# Patient Record
Sex: Male | Born: 1935 | Race: White | Hispanic: No | Marital: Married | Smoking: Never smoker
Health system: Southern US, Community
[De-identification: ages and names within clinical notes are randomized; demographics above are authoritative.]

## PROBLEM LIST (undated history)

## (undated) DIAGNOSIS — K5790 Diverticulosis of intestine, part unspecified, without perforation or abscess without bleeding: Secondary | ICD-10-CM

## (undated) DIAGNOSIS — I471 Supraventricular tachycardia, unspecified: Secondary | ICD-10-CM

## (undated) DIAGNOSIS — N529 Male erectile dysfunction, unspecified: Secondary | ICD-10-CM

## (undated) DIAGNOSIS — K449 Diaphragmatic hernia without obstruction or gangrene: Secondary | ICD-10-CM

## (undated) DIAGNOSIS — K439 Ventral hernia without obstruction or gangrene: Secondary | ICD-10-CM

## (undated) DIAGNOSIS — I10 Essential (primary) hypertension: Secondary | ICD-10-CM

## (undated) DIAGNOSIS — I440 Atrioventricular block, first degree: Secondary | ICD-10-CM

## (undated) DIAGNOSIS — H9190 Unspecified hearing loss, unspecified ear: Secondary | ICD-10-CM

## (undated) DIAGNOSIS — E785 Hyperlipidemia, unspecified: Secondary | ICD-10-CM

## (undated) DIAGNOSIS — N400 Enlarged prostate without lower urinary tract symptoms: Secondary | ICD-10-CM

## (undated) DIAGNOSIS — J690 Pneumonitis due to inhalation of food and vomit: Secondary | ICD-10-CM

## (undated) DIAGNOSIS — J36 Peritonsillar abscess: Secondary | ICD-10-CM

## (undated) DIAGNOSIS — M549 Dorsalgia, unspecified: Secondary | ICD-10-CM

## (undated) DIAGNOSIS — M1990 Unspecified osteoarthritis, unspecified site: Secondary | ICD-10-CM

## (undated) DIAGNOSIS — K219 Gastro-esophageal reflux disease without esophagitis: Secondary | ICD-10-CM

## (undated) DIAGNOSIS — N183 Chronic kidney disease, stage 3 unspecified: Secondary | ICD-10-CM

## (undated) DIAGNOSIS — Z8739 Personal history of other diseases of the musculoskeletal system and connective tissue: Secondary | ICD-10-CM

## (undated) DIAGNOSIS — G8929 Other chronic pain: Secondary | ICD-10-CM

## (undated) DIAGNOSIS — C189 Malignant neoplasm of colon, unspecified: Secondary | ICD-10-CM

## (undated) DIAGNOSIS — G4752 REM sleep behavior disorder: Secondary | ICD-10-CM

## (undated) DIAGNOSIS — D649 Anemia, unspecified: Secondary | ICD-10-CM

## (undated) DIAGNOSIS — M75100 Unspecified rotator cuff tear or rupture of unspecified shoulder, not specified as traumatic: Secondary | ICD-10-CM

## (undated) DIAGNOSIS — E876 Hypokalemia: Secondary | ICD-10-CM

## (undated) DIAGNOSIS — I700 Atherosclerosis of aorta: Secondary | ICD-10-CM

## (undated) DIAGNOSIS — F0280 Dementia in other diseases classified elsewhere without behavioral disturbance: Secondary | ICD-10-CM

## (undated) DIAGNOSIS — Z87442 Personal history of urinary calculi: Secondary | ICD-10-CM

## (undated) HISTORY — PX: VEIN LIGATION AND STRIPPING: SHX2653

## (undated) HISTORY — DX: Benign prostatic hyperplasia without lower urinary tract symptoms: N40.0

## (undated) HISTORY — DX: Chronic kidney disease, stage 3 unspecified: N18.30

## (undated) HISTORY — DX: Hyperlipidemia, unspecified: E78.5

## (undated) HISTORY — DX: Essential (primary) hypertension: I10

## (undated) HISTORY — DX: Male erectile dysfunction, unspecified: N52.9

## (undated) HISTORY — DX: Dorsalgia, unspecified: M54.9

## (undated) HISTORY — DX: Unspecified hearing loss, unspecified ear: H91.90

## (undated) HISTORY — DX: Unspecified rotator cuff tear or rupture of unspecified shoulder, not specified as traumatic: M75.100

## (undated) HISTORY — PX: ROTATOR CUFF REPAIR: SHX139

## (undated) HISTORY — DX: Supraventricular tachycardia, unspecified: I47.10

## (undated) HISTORY — DX: Malignant neoplasm of colon, unspecified: C18.9

## (undated) HISTORY — DX: Gastro-esophageal reflux disease without esophagitis: K21.9

## (undated) HISTORY — DX: Pneumonitis due to inhalation of food and vomit: J69.0

## (undated) HISTORY — DX: Other chronic pain: G89.29

## (undated) HISTORY — PX: COLONOSCOPY: SHX174

## (undated) HISTORY — DX: Chronic kidney disease, stage 3 (moderate): N18.3

## (undated) HISTORY — DX: Diverticulosis of intestine, part unspecified, without perforation or abscess without bleeding: K57.90

## (undated) HISTORY — PX: EYE SURGERY: SHX253

## (undated) HISTORY — DX: Ventral hernia without obstruction or gangrene: K43.9

## (undated) HISTORY — PX: CATARACT EXTRACTION W/ INTRAOCULAR LENS IMPLANT: SHX1309

## (undated) HISTORY — DX: Supraventricular tachycardia: I47.1

## (undated) HISTORY — DX: REM sleep behavior disorder: G47.52

---
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
## 1961-09-12 HISTORY — PX: INGUINAL HERNIA REPAIR: SUR1180

## 1986-09-12 DIAGNOSIS — C189 Malignant neoplasm of colon, unspecified: Secondary | ICD-10-CM

## 1986-09-12 DIAGNOSIS — K439 Ventral hernia without obstruction or gangrene: Secondary | ICD-10-CM

## 1986-09-12 HISTORY — PX: UMBILICAL HERNIA REPAIR: SHX196

## 1986-09-12 HISTORY — DX: Malignant neoplasm of colon, unspecified: C18.9

## 1986-09-12 HISTORY — DX: Ventral hernia without obstruction or gangrene: K43.9

## 1986-09-12 HISTORY — PX: HEMICOLOECTOMY W/ ANASTOMOSIS: SHX1739

## 1998-09-12 HISTORY — PX: ROTATOR CUFF REPAIR: SHX139

## 1998-09-25 ENCOUNTER — Ambulatory Visit (HOSPITAL_BASED_OUTPATIENT_CLINIC_OR_DEPARTMENT_OTHER): Admission: RE | Admit: 1998-09-25 | Discharge: 1998-09-25 | Payer: Self-pay | Admitting: General Surgery

## 2003-10-20 ENCOUNTER — Encounter: Payer: Self-pay | Admitting: Family Medicine

## 2005-08-29 ENCOUNTER — Encounter: Payer: Self-pay | Admitting: Family Medicine

## 2006-10-18 ENCOUNTER — Other Ambulatory Visit: Payer: Self-pay

## 2006-10-18 ENCOUNTER — Ambulatory Visit: Payer: Self-pay | Admitting: Specialist

## 2008-07-01 ENCOUNTER — Encounter: Payer: Self-pay | Admitting: Gastroenterology

## 2008-07-01 ENCOUNTER — Encounter: Payer: Self-pay | Admitting: Family Medicine

## 2008-07-22 ENCOUNTER — Ambulatory Visit: Payer: Self-pay | Admitting: Family Medicine

## 2008-07-22 DIAGNOSIS — C189 Malignant neoplasm of colon, unspecified: Secondary | ICD-10-CM

## 2008-07-23 DIAGNOSIS — E785 Hyperlipidemia, unspecified: Secondary | ICD-10-CM

## 2008-07-23 DIAGNOSIS — K219 Gastro-esophageal reflux disease without esophagitis: Secondary | ICD-10-CM

## 2008-07-23 DIAGNOSIS — N183 Chronic kidney disease, stage 3 (moderate): Secondary | ICD-10-CM

## 2008-08-12 ENCOUNTER — Telehealth: Payer: Self-pay | Admitting: Gastroenterology

## 2008-09-22 ENCOUNTER — Ambulatory Visit: Payer: Self-pay | Admitting: Family Medicine

## 2008-09-23 LAB — CONVERTED CEMR LAB
CO2: 30 meq/L (ref 19–32)
Chloride: 105 meq/L (ref 96–112)
Creatinine, Ser: 1.3 mg/dL (ref 0.4–1.5)
GFR calc non Af Amer: 57 mL/min
Potassium: 4.1 meq/L (ref 3.5–5.1)
Sodium: 141 meq/L (ref 135–145)

## 2008-12-08 ENCOUNTER — Ambulatory Visit: Payer: Self-pay | Admitting: Family Medicine

## 2008-12-08 DIAGNOSIS — H9190 Unspecified hearing loss, unspecified ear: Secondary | ICD-10-CM

## 2008-12-08 DIAGNOSIS — R269 Unspecified abnormalities of gait and mobility: Secondary | ICD-10-CM | POA: Insufficient documentation

## 2008-12-08 DIAGNOSIS — H9193 Unspecified hearing loss, bilateral: Secondary | ICD-10-CM | POA: Insufficient documentation

## 2008-12-08 LAB — CONVERTED CEMR LAB
CO2: 31 meq/L (ref 19–32)
Chloride: 109 meq/L (ref 96–112)
Glucose, Bld: 92 mg/dL (ref 70–99)
Potassium: 4.4 meq/L (ref 3.5–5.1)
Sodium: 145 meq/L (ref 135–145)

## 2008-12-09 ENCOUNTER — Encounter: Admission: RE | Admit: 2008-12-09 | Discharge: 2008-12-09 | Payer: Self-pay | Admitting: Family Medicine

## 2008-12-15 ENCOUNTER — Ambulatory Visit: Payer: Self-pay | Admitting: Family Medicine

## 2008-12-15 DIAGNOSIS — M719 Bursopathy, unspecified: Secondary | ICD-10-CM

## 2008-12-15 DIAGNOSIS — M67919 Unspecified disorder of synovium and tendon, unspecified shoulder: Secondary | ICD-10-CM | POA: Insufficient documentation

## 2008-12-22 ENCOUNTER — Ambulatory Visit: Payer: Self-pay | Admitting: Gastroenterology

## 2008-12-30 ENCOUNTER — Telehealth: Payer: Self-pay | Admitting: Family Medicine

## 2009-01-08 ENCOUNTER — Ambulatory Visit: Payer: Self-pay | Admitting: Gastroenterology

## 2009-01-08 LAB — HM COLONOSCOPY

## 2009-12-03 ENCOUNTER — Encounter: Payer: Self-pay | Admitting: Family Medicine

## 2010-01-04 ENCOUNTER — Telehealth: Payer: Self-pay | Admitting: Family Medicine

## 2010-01-07 ENCOUNTER — Ambulatory Visit: Payer: Self-pay | Admitting: Family Medicine

## 2010-01-07 DIAGNOSIS — N401 Enlarged prostate with lower urinary tract symptoms: Secondary | ICD-10-CM

## 2010-01-07 DIAGNOSIS — N529 Male erectile dysfunction, unspecified: Secondary | ICD-10-CM | POA: Insufficient documentation

## 2010-01-08 ENCOUNTER — Telehealth: Payer: Self-pay | Admitting: Family Medicine

## 2010-01-08 DIAGNOSIS — I10 Essential (primary) hypertension: Secondary | ICD-10-CM

## 2010-01-11 ENCOUNTER — Telehealth: Payer: Self-pay | Admitting: Family Medicine

## 2010-01-11 LAB — CONVERTED CEMR LAB
ALT: 45 units/L (ref 0–53)
Bilirubin, Direct: 0.1 mg/dL (ref 0.0–0.3)
CO2: 31 meq/L (ref 19–32)
Calcium: 9.1 mg/dL (ref 8.4–10.5)
Chloride: 104 meq/L (ref 96–112)
Creatinine, Ser: 1.9 mg/dL — ABNORMAL HIGH (ref 0.4–1.5)
Direct LDL: 83.4 mg/dL
Eosinophils Relative: 3.6 % (ref 0.0–5.0)
GFR calc non Af Amer: 36.84 mL/min (ref >60)
Glucose, Bld: 91 mg/dL (ref 70–99)
MCV: 94.1 fL (ref 78.0–100.0)
Monocytes Absolute: 1 10*3/uL (ref 0.1–1.0)
Monocytes Relative: 10.9 % (ref 3.0–12.0)
Neutrophils Relative %: 71.8 % (ref 43.0–77.0)
Platelets: 267 10*3/uL (ref 150.0–400.0)
Potassium: 4.8 meq/L (ref 3.5–5.1)
Sodium: 142 meq/L (ref 135–145)
Total Bilirubin: 0.3 mg/dL (ref 0.3–1.2)
WBC: 9.5 10*3/uL (ref 4.5–10.5)

## 2010-01-25 ENCOUNTER — Encounter: Payer: Self-pay | Admitting: Family Medicine

## 2010-02-12 ENCOUNTER — Telehealth: Payer: Self-pay | Admitting: Family Medicine

## 2010-10-14 NOTE — Progress Notes (Signed)
Summary: call a nurse  Phone Note Call from Patient   Call For: [name] MD Summary of Call: Triage Record Num: [id number] Operator: [name] Rhinehardt Patient Name: Michael [name] Call Date & Time: 01/01/2010 12:29:21PM Patient Phone: [phone] PCP: [name] Patient Gender: Male PCP Fax : [phone] Patient DOB: 1932/05/16 Practice Name: [facility] Reason for Call: Caller requesting an Rx for Viagra before he goes out of town tonight. Has not had an Rx from this PCP before. RN advised caller that this would not be able to be be called in before seeing MD next week. Protocol(s) Used: Medication Question Calls, No Triage (Adults) Recommended Outcome per Protocol: Call Provider within 24 Hours Override Outcome if Used in Protocol: Call Provider When Office is Open RN Reason for Override Outcome: Provider Alert / Physician's Order Reason for Outcome: Caller requesting a non urgent new prescription or refill and triager unable to refill per unit policy Care Advice:  ~ 01/01/2010 12:42:57PM Page 1 of 1 CAN_TriageRpt_ Initial call taken by: Melody Comas,  January 04, 2010 10:53 AM  Follow-up for Phone Call        OK to call in Viagra 100 mg, 1/2 to 1 tab by mouth as needed prior to intercourse. #10, 11 refills  Tell him to take a 1/2 -- that is usually enough. If not, OK to take a full Follow-up by: [name] MD,  January 04, 2010 11:13 AM  Additional Follow-up for Phone Call Additional follow up Details #1::        Patient must already be out of town will call in if patietn calls back Additional Follow-up by: Benny Lennert CMA Duncan Dull),  January 04, 2010 11:54 AM

## 2010-10-14 NOTE — Letter (Signed)
Summary: [facility]  [facility]   Imported By: Lanelle Bal 02/02/2010 09:53:59  _____________________________________________________________________  External Attachment:    Type:   Image     Comment:   External Document

## 2010-10-14 NOTE — Progress Notes (Signed)
Summary: refill request for meloxicam  Phone Note Refill Request Message from:  Fax from Pharmacy  Refills Requested: Medication #1:  MELOXICAM 15 MG TABS one by mouth daily   Last Refilled: 04/21/2009 Faxed request from [facility] [address].  Initial call taken by: Lowella Petties CMA,  January 08, 2010 11:42 AM    Prescriptions: MELOXICAM 15 MG TABS (MELOXICAM) one by mouth daily  #30 x 6   Entered and Authorized by:   [name] MD   Signed by:   [name] MD on 01/08/2010   Method used:   Electronically to        [facility] [address] (retail)       [address], [address]       [place], [place]  [address]       Ph: [phone]       Fax: [phone]   RxID:   [id number]

## 2010-10-14 NOTE — Progress Notes (Signed)
Summary: Cialis samples  Phone Note Call from Patient Call back at Home Phone [phone]   Caller: Patient Call For: Michael Beat MD Summary of Call: Mr. Decelles said that he was at [facility] to pick up some prescriptions and that Rob told him that he could get some samples of Cialis but that the prescription would have to be sent in specifically for the samples.  It is apparently a promotional kind of thing.  Mr. Tagliaferro would like to get these samples.  If you are not familiar with what he is speaking of, I will call [facility] to get more info if you prefer.  Patient asked that we please phone him  to let him know if this has been called in.  Uses [facility] Initial call taken by: Delilah Shan CMA (AAMA),  February 12, 2010 11:15 AM  Follow-up for Phone Call        I am not sure -- fine for him to take an use. Can you call [facility], confirm how it needs to be ordered to use this program, and call in for me.  ok to call in up to 10. directions in his med list Follow-up by: Michael Beat MD,  February 12, 2010 1:45 PM  Additional Follow-up for Phone Call Additional follow up Details #1::        Rob says he was referring to the coupon that is with our samples.  The patient can take the voucher and a new prescription to the pharmacy and receive a Free Trial of Cialis (30 tablets of Cialis daily) OR (3 tablets of 36 hour Cialis).  The prescription needs to be written so that it can be carried with the voucher for one of the above listed forms of the med (daily or 36 hour).  Patient will pick up the script and voucher when they return from out of town.  Please give Rx. to me upon your return next week. Additional Follow-up by: Delilah Shan CMA Duncan Dull),  February 12, 2010 2:15 PM    Additional Follow-up for Phone Call Additional follow up Details #2::    call and give coupon card Follow-up by: Michael Beat MD,  February 15, 2010 8:02 AM  Additional Follow-up for Phone Call Additional follow up Details #3::  Details for Additional Follow-up Action Taken: Patient Advised. Prescription left at front desk.  Additional Follow-up by: Delilah Shan CMA (AAMA),  February 15, 2010 8:16 AM  Prescriptions: CIALIS 20 MG TABS (TADALAFIL) 1 by mouth 30 minutes prior to intercourse  #3 x 0   Entered and Authorized by:   Michael Beat MD   Signed by:   Michael Beat MD on 02/15/2010   Method used:   Print then Give to Patient   RxID:   [id number]

## 2010-10-14 NOTE — Consult Note (Signed)
Summary: [facility]  [facility]   Imported By: Lanelle Bal 12/10/2009 12:36:01  _____________________________________________________________________  External Attachment:    Type:   Image     Comment:   External Document

## 2010-10-14 NOTE — Assessment & Plan Note (Signed)
Summary: FOLLOW UP/RI   Vital Signs:  Patient profile:   77 year old male Height:      71 inches Weight:      224.4 pounds BMI:     31.41 Temp:     97.8 degrees F oral Pulse rate:   80 / minute Pulse rhythm:   regular BP sitting:   110 / 70  (left arm) Cuff size:   large  Vitals Entered By: Benny Lennert CMA Duncan Dull) (January 07, 2010 3:50 PM)  Serial Vital Signs/Assessments:  Time      Position  BP       Pulse  Resp  Temp     By 4:20 PM             128/72                         [name] MD   History of Present Illness: Chief complaint follow up, the patient has multiple complaint and concerns that he brings up   BP stable: tol medications fine, concern over 110/70 obtained by nursing, so checked on my entry to room  Chol: tol medications fine, but he has not had a repeat lipid in > 1 year  Colon CA - 1988, 12/2008, colonoscopy, repeat 5 years Dr. Arlyce Dice, f/u colon in 5 years  ED: intermittent problems with erections, has had for some time. Poor quality. Able to ejaculate. Sometimes will lose erection, sometimes trouble initiating. Viagra or other med? interested  Conjunctivitis: Was away this past weekend. Monday - started to see a problem with the eyes. Having a lot of discharge - last night they were a whole lot. Has been using compresses on eyes. No known contact with conjunctivitis.  urinary outflow problems: longstanding, has been evaluated by 2 different urologists in the past. Trouble initiating and cutting off stream. Up in the middle of the night often every hour or two.  dysuria - he had a little in the past week, but that has now resolved.  Also needs f/u other labs, longterm med lab f/u, CEA  Also has c/o L > R impingement symptoms. Aggrevated recently with very heavy work outside. Last year was quite symptomatic, resolved when he went to [place] over the winter.    Allergies (verified): No Known Drug Allergies  Past History:  Past medical,  surgical, family and social histories (including risk factors) reviewed, and no changes noted (except as noted below).  Past Medical History: Colon CA, 1988 Ventral hernia, 1988 Hyperlipidemia Renal insufficiency GERD R rotator cuff rupture, s/p repair Hypertension  Past Surgical History: Reviewed history from 12/15/2008 and no changes required. hemicolectomy, 1988, [name] Ventral hernia repair, 1988, [facility] R shoulder RTC repair, [facility]  Family History: Reviewed history from 07/22/2008 and no changes required. Father, OA  Social History: Reviewed history from 07/22/2008 and no changes required. [name]'s father [name]'s father in Social worker (front office) Research in Teaching laboratory technician. 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  General:  Denies fever. Eyes:  Complains of discharge, eye irritation, and red eye. ENT:  Complains of postnasal drainage. CV:  Denies  chest pain or discomfort. Resp:  Denies cough and shortness of breath. GI:  Denies nausea and vomiting. GU:  Complains of urinary hesitancy; denies urinary frequency. MS:  Complains of joint pain and stiffness; denies joint swelling. Neuro:  Denies headaches. Psych:  Denies anxiety and depression. Endo:  Complains of excessive urination. Heme:  Denies abnormal bruising and enlarge lymph nodes.  Physical Exam  General:  Well-developed,well-nourished,in no acute distress; alert,appropriate and cooperative throughout examination Head:  Normocephalic and atraumatic without obvious abnormalities. No apparent alopecia or balding. Eyes:  pinkish, irritated sclera and irritated conjunctiva B Ears:  External ear exam  shows no significant lesions or deformities.  Otoscopic examination reveals clear canals, tympanic membranes are intact bilaterally without bulging, retraction, inflammation or discharge. Hearing is grossly normal bilaterally. Nose:  External nasal examination shows no deformity or inflammation. Nasal mucosa are pink and moist without lesions or exudates. Mouth:  Oral mucosa and oropharynx without lesions or exudates.  Teeth in good repair. Neck:  No deformities, masses, or tenderness noted. Chest Wall:  No deformities, masses, tenderness or gynecomastia noted. Lungs:  Normal respiratory effort, chest expands symmetrically. Lungs are clear to auscultation, no crackles or wheezes. Heart:  Normal rate and regular rhythm. S1 and S2 normal without gallop, murmur, click, rub or other extra sounds. Abdomen:  Bowel sounds positive,abdomen soft and non-tender without masses, organomegaly or hernias noted. Rectal:  No external abnormalities noted. Normal sphincter tone. No rectal masses or tenderness. Genitalia:  Testes bilaterally descended without nodularity, tenderness or masses. No scrotal masses or lesions. No penis lesions or urethral discharge. Prostate:  mildly enlarged, NT. No nodules. Msk:  Shoulder: B Inspection: No muscle wasting or winging Ecchymosis/edema: neg  AC joint, scapula, clavicle: NT Cervical spine: NT, full ROM Spurling's: neg Abduction: full, 5/5 Flexion: full, 5/5 IR, full, lift-off: 4/5 ER at neutral: full, 4/5 AC crossover: neg Neer: pos Hawkins: pos, L >> R Drop Test: neg Empty Can: pos Supraspinatus insertion: mild-mod T Bicipital groove: NT Speed's: pos, B C5-T1 intact  Neuro: Sensation intact Grip 5/5  Extremities:  No clubbing, cyanosis, edema, or deformity noted with normal full range of motion of all joints.   Neurologic:  alert & oriented X3, sensation intact to light touch, and gait normal.   Skin:  no rashes.   Cervical Nodes:  No lymphadenopathy  noted Psych:  Cognition and judgment appear intact. Alert and cooperative with normal attention span and concentration. No apparent delusions, illusions, hallucinations   Impression & Recommendations:  Problem # 1:  CONJUNCTIVITIS (ICD-372.30) Assessment New  His updated medication list for this problem includes:    Polytrim 10000-0.1 Unit/ml-% Soln (Polymyxin b-trimethoprim) .Marland Kitchen... 1 gtt in affected eye qid x 7days  Discussed treatment, and urged patient to wash hands carefully after touching face.   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 1 by mouth 30 minutes prior to intercourse  Discussed proper use of medications, as well as side effects.   Problem # 3:  BENIGN PROSTATIC HYPERTROPHY, WITH URINARY OBSTRUCTION (ICD-600.01) Assessment: New Flomax trial  Problem # 4:  HYPERTENSION (ICD-401.9) Assessment: Unchanged  His updated medication list for this problem includes:    Diltiazem Hcl Er Beads 240 Mg Xr24h-cap (Diltiazem hcl er beads) .Marland Kitchen... Take one daily  BP today: 110/70 Prior BP: 120/80 (12/15/2008)  Labs Reviewed: K+: 4.4 (12/08/2008) Creat: : 1.5 (12/08/2008)  Problem # 5:  ROTATOR CUFF SYNDROME, RIGHT (ICD-726.10) Assessment: Deteriorated L > R RTC At 77, discussed activity mod, RTC and scapular stabilization SAD, DCE seems less of a viabile option if conservative measures allow to resolve  Problem # 6:  HYPERLIPIDEMIA (ICD-272.4) Assessment: Unchanged  His updated medication list for this problem includes:    Lovastatin 20 Mg Tabs (Lovastatin) .Marland Kitchen... 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 Take one daily 2)  Fish Oil 1200 Mg Caps (Omega-3 fatty acids) .... Take one capsule daily 3)  Marine scientist  .... Take one daily 4)  Calcium 600mg  and Vit D-3  .... Take one daily 5)  Aspirin 81 Mg Tbec (Aspirin) .... Take one sometimes 6)  Omeprazole 20 Mg Tbec (Omeprazole) .Marland Kitchen.. 1 by mouth q day 7)  Meloxicam 15 Mg Tabs (Meloxicam) .... One by mouth daily 8)  Lovastatin 20 Mg Tabs (Lovastatin) .... Take 1 tablet by mouth once daily 9)  Tamsulosin Hcl 0.4 Mg Caps (Tamsulosin hcl) .Marland Kitchen.. 1 by mouth daily 10)  Cialis 20 Mg Tabs (Tadalafil) .Marland Kitchen.. 1 by mouth 30 minutes prior to intercourse 11)  Polytrim 10000-0.1 Unit/ml-% Soln (Polymyxin b-trimethoprim) .Marland Kitchen.. 1 gtt in affected eye qid x 7days 12)  Zolpidem Tartrate 10 Mg Tabs (Zolpidem tartrate) .Marland Kitchen.. 1 by mouth at bedtime as needed insomnia  Other Orders: TLB-CBC Platelet - w/Differential (85025-CBCD) TLB-Hepatic/Liver Function Pnl (80076-HEPATIC) Prescriptions: ZOLPIDEM TARTRATE 10 MG TABS (ZOLPIDEM TARTRATE) 1 by mouth at bedtime as needed insomnia  #30 x 1   Entered and Authorized by:   [name] MD   Signed by:   [name] MD on 01/07/2010   Method used:   Print then Give to Patient   RxID:   [id number] POLYTRIM 10000-0.1 UNIT/ML-%  SOLN (POLYMYXIN B-TRIMETHOPRIM) 1 gtt in affected eye qid x 7days  #1 x 0   Entered and Authorized by:   [name] MD   Signed by:   [name] MD on 01/07/2010   Method used:   Print then Give to Patient   RxID:   [id number] CIALIS 20 MG TABS (TADALAFIL) 1 by mouth 30 minutes prior to  intercourse  #10 x 11   Entered and Authorized by:   [name] MD   Signed by:   [name] MD on 01/07/2010   Method used:   Print then Give to Patient   RxID:   [id number] TAMSULOSIN HCL 0.4 MG CAPS (TAMSULOSIN HCL) 1 by mouth daily  #30 x 11   Entered and Authorized by:   [name] MD   Signed by:   [name] MD on 01/07/2010   Method used:   Print then Give to Patient   RxID:   [id number] LOVASTATIN 20 MG TABS (LOVASTATIN) take 1 tablet by mouth once daily  #90 x 3   Entered and Authorized by:   [name] MD   Signed by:   [name] MD on 01/07/2010   Method used:  Print then Give to Patient   RxID:   [id number] OMEPRAZOLE 20 MG TBEC (OMEPRAZOLE) 1 by mouth q day  #30 x 11   Entered and Authorized by:   [name] MD   Signed by:   [name] MD on 01/07/2010   Method used:   Print then Give to Patient   RxID:   [id number] DILTIAZEM HCL ER BEADS 240 MG XR24H-CAP (DILTIAZEM HCL ER BEADS) take one daily  #30 x 12   Entered and Authorized by:   [name] MD   Signed by:   [name] MD on 01/07/2010   Method used:   Print then Give to Patient   RxID:   [id number]   Current Allergies (reviewed today): No known allergies

## 2010-10-14 NOTE — Progress Notes (Signed)
  Phone Note Call from Patient Call back at [facility] Phone [phone]   Caller: Patient Call For: Dr Patsy Lager Summary of Call: Please call pt regarding PSA results. Initial call taken by: Beau Fanny,  Jan 11, 2010 9:44 AM  Follow-up for Phone Call        also patient needs meloxicam refilled if possible Follow-up by: Benny Lennert CMA Duncan Dull),  Jan 11, 2010 10:49 AM  Additional Follow-up for Phone Call Additional follow up Details #1::        d/w pt  PSA = 14  could be due to prostatitis will send in cipro, has f/u 01/25/2010 with urology, which I think he needs Additional Follow-up by: [name] MD,  Jan 11, 2010 11:28 AM    New/Updated Medications: CIPROFLOXACIN HCL 500 MG TABS (CIPROFLOXACIN HCL) 1 by mouth two times a day Prescriptions: CIPROFLOXACIN HCL 500 MG TABS (CIPROFLOXACIN HCL) 1 by mouth two times a day  #28 x 0   Entered and Authorized by:   [name] MD   Signed by:   [name] MD on 01/11/2010   Method used:   Electronically to        [facility]* (retail)       [address]       [place], [place]  [address]       Ph: [phone]       Fax: [phone]   RxID:   [id number]

## 2011-01-20 ENCOUNTER — Other Ambulatory Visit: Payer: Self-pay | Admitting: Family Medicine

## 2011-01-28 ENCOUNTER — Encounter: Payer: Self-pay | Admitting: Family Medicine

## 2011-01-31 ENCOUNTER — Ambulatory Visit (INDEPENDENT_AMBULATORY_CARE_PROVIDER_SITE_OTHER): Payer: Medicare Other | Admitting: Family Medicine

## 2011-01-31 ENCOUNTER — Encounter: Payer: Self-pay | Admitting: Family Medicine

## 2011-01-31 VITALS — BP 112/70 | HR 80 | Temp 97.7°F | Ht 70.5 in | Wt 220.1 lb

## 2011-01-31 DIAGNOSIS — M7020 Olecranon bursitis, unspecified elbow: Secondary | ICD-10-CM | POA: Insufficient documentation

## 2011-01-31 NOTE — Patient Instructions (Signed)
Make appointment at your convenience in next few weeks-months fasting for blood work as well as physical. For olecranon bursitis - drained today.  Keep arm elevated, keep in bandage for next 24 hours then wrap as needed. May do ice as well. Update [place] if returns.

## 2011-01-31 NOTE — Progress Notes (Signed)
  Subjective:    Patient ID: Michael [name], male    DOB: 1932/01/28, 77 y.o.   MRN: [id number]  HPI CC: elbow swelling  Insect bite 1 wk ago R elbow, caused swelling 4-5 hours later, swollen since.  Thinks swelling has gone down some.  Seen at [facility] last week, did not drain it.  Tried alleve x1.  Put ice on it as well, no noted improvement.  No pain.    Never had elbow swelling in past.  No pain, spreading redness, warmth.  H/o some arthritis in past.  No gout.  Review of Systems Per HPI    Objective:   Physical Exam  [nursing notereviewed. Constitutional: He appears well-developed and well-nourished. No distress.  Musculoskeletal:       Right elbow: He exhibits swelling and effusion. He exhibits normal range of motion. no tenderness found.       Left elbow: Normal.       R olecranon bursitis, no erythema or significant tenderness at site       Aspiration: area cleaned with EtOH wipe then with betadine.  Anesthesia achieved with ethyl chloride.  22g needle inserted into proximal olecranon bursa, 15cc bloody serous fluid aspirated with good decompression of inflamed bursa.  Wrapped with compression bandage (kerlex).  Pt tolerated procedure well, no blood loss  Assessment & Plan:

## 2011-01-31 NOTE — Assessment & Plan Note (Signed)
Traumatic, after insect bite.   Aspiration performed.   Fluid sent for culture to r/o infection. See pt instructions

## 2011-02-02 ENCOUNTER — Telehealth: Payer: Self-pay | Admitting: Family Medicine

## 2011-02-02 MED ORDER — NAPROXEN 500 MG PO TABS: ORAL_TABLET | ORAL | Status: DC

## 2011-02-02 NOTE — Telephone Encounter (Signed)
Called to f/u on wife.  Pt comes on and states olecranon bursitis has reaccumulated but not bothering him.  I will send in NSAID, advised pt to come in for re aspiration and possible steroid injection of becoming bothersome.  Pt agrees.

## 2011-02-03 ENCOUNTER — Other Ambulatory Visit: Payer: Self-pay | Admitting: *Deleted

## 2011-02-03 MED ORDER — DILTIAZEM HCL ER COATED BEADS 240 MG PO CP24: 240.0000 mg | ORAL_CAPSULE | Freq: Every day | ORAL | Status: DC

## 2011-02-04 LAB — BODY FLUID CULTURE
Gram Stain: NONE SEEN
Organism ID, Bacteria: NO GROWTH

## 2011-02-08 ENCOUNTER — Telehealth: Payer: Self-pay | Admitting: Family Medicine

## 2011-02-08 NOTE — Telephone Encounter (Signed)
Patient's wife notified and will have patient call back if necessary.

## 2011-02-08 NOTE — Telephone Encounter (Signed)
Please notify elbow fluid culture returned without evidence of infection.  If reaccumulated may need aspiration again.

## 2011-04-20 ENCOUNTER — Encounter: Payer: Medicare Other | Admitting: Family Medicine

## 2011-05-03 ENCOUNTER — Encounter: Payer: Self-pay | Admitting: Family Medicine

## 2011-05-03 ENCOUNTER — Ambulatory Visit (INDEPENDENT_AMBULATORY_CARE_PROVIDER_SITE_OTHER): Payer: Medicare Other | Admitting: Family Medicine

## 2011-05-03 ENCOUNTER — Encounter: Payer: Medicare Other | Admitting: Family Medicine

## 2011-05-03 VITALS — BP 120/72 | HR 78 | Temp 98.2°F | Ht 71.0 in | Wt 218.8 lb

## 2011-05-03 DIAGNOSIS — K769 Liver disease, unspecified: Secondary | ICD-10-CM

## 2011-05-03 DIAGNOSIS — R739 Hyperglycemia, unspecified: Secondary | ICD-10-CM

## 2011-05-03 DIAGNOSIS — N289 Disorder of kidney and ureter, unspecified: Secondary | ICD-10-CM

## 2011-05-03 DIAGNOSIS — Z79899 Other long term (current) drug therapy: Secondary | ICD-10-CM

## 2011-05-03 DIAGNOSIS — K7689 Other specified diseases of liver: Secondary | ICD-10-CM

## 2011-05-03 DIAGNOSIS — I10 Essential (primary) hypertension: Secondary | ICD-10-CM

## 2011-05-03 DIAGNOSIS — N401 Enlarged prostate with lower urinary tract symptoms: Secondary | ICD-10-CM

## 2011-05-03 DIAGNOSIS — E785 Hyperlipidemia, unspecified: Secondary | ICD-10-CM

## 2011-05-03 DIAGNOSIS — Z0000 Encounter for general adult medical examination without abnormal findings: Secondary | ICD-10-CM

## 2011-05-03 DIAGNOSIS — R7309 Other abnormal glucose: Secondary | ICD-10-CM

## 2011-05-03 DIAGNOSIS — N259 Disorder resulting from impaired renal tubular function, unspecified: Secondary | ICD-10-CM

## 2011-05-03 DIAGNOSIS — C189 Malignant neoplasm of colon, unspecified: Secondary | ICD-10-CM

## 2011-05-03 MED ORDER — TRIAMCINOLONE ACETONIDE 0.1 % EX CREA: TOPICAL_CREAM | Freq: Two times a day (BID) | CUTANEOUS | Status: DC

## 2011-05-03 MED ORDER — ZOLPIDEM TARTRATE 10 MG PO TABS: ORAL_TABLET | ORAL | Status: DC

## 2011-05-03 NOTE — Progress Notes (Signed)
Subjective:    Patient ID: Michael [name], male    DOB: [birth year], 77 y.o.   MRN: [id number]  HPI  Michael [name], a 77 y.o. male presents today in the office for the following:  Medicare wellness, multiple other problems.  I have personally reviewed the Medicare Annual Wellness questionnaire and have noted 1. The patient's medical and social history 2. Their use of alcohol, tobacco or illicit drugs 3. Their current medications and supplements 4. The patient's functional ability including ADL's, fall risks, home safety risks and hearing or visual             impairment. 5. Diet and physical activities 6. Evidence for depression or mood disorders  The patients weight, height, BMI and visual acuity have been recorded in the chart I have made referrals, counseling and provided education to the patient based review of the above and I have provided the pt with a written personalized care plan for preventive services.  I have provided the patient with a copy of your personalized plan for preventive services. Instructed to take the time to review along with their updated medication list.   HTN: Tolerating all medications without side effects Stable and at goal No CP, no sob. No HA.  BP Readings from Last 3 Encounters:  05/03/11 120/72  01/31/11 112/70  01/07/10 110/70    Basic Metabolic Panel:    Component Value Date/Time   NA 142 01/07/2010 1624   K 4.8 01/07/2010 1624   CL 104 01/07/2010 1624   CO2 31 01/07/2010 1624   BUN 27* 01/07/2010 1624   CREATININE 1.9* 01/07/2010 1624   GLUCOSE 91 01/07/2010 1624   CALCIUM 9.1 01/07/2010 1624   Lipids: Doing well, stable. Tolerating meds fine with no SE. Panel reviewed with patient. Needs new labs  Lipids:    Component Value Date/Time   LDLDIRECT 83.4 01/07/2010 1624    Lab Results  Component Value Date   ALT 45 01/07/2010   AST 38* 01/07/2010   ALKPHOS 65 01/07/2010   BILITOT 0.3 01/07/2010   Lesions on his liver x 2 and kidneys x  2 seen on CT in 11/2010 - see scanned report. Prior history of colon cancer. Asymptomatic.   Poison Ivy: multiple episodes of poison ivy, bug bites this summer treated with OTC topicals  Insomnia: intermittently will take Ambien with good effect  ED: occ need, requests samples.  The PMH, PSH, Social History, Family History, Medications, and allergies have been reviewed in [facility], and have been updated if relevant.  Review of Systems ROS: GEN: No acute illnesses, no fevers, chills. CT from above reviewed with patient. GI: No n/v/d, eating normally Pulm: No SOB Feels well Skin probs above No CP No SOB No black tarry stool or bloody stool Interactive and getting along well at home. Otherwise, the pertinent positives and negatives are listed above and in the HPI, otherwise a full review of systems has been reviewed and is negative unless noted positive.     Objective:   Physical Exam   Physical Exam  Blood pressure 120/72, pulse 78, temperature 98.2 F (36.8 C), temperature source Oral, height 5\' 11"  (1.803 m), weight 218 lb 12.8 oz (99.247 kg), SpO2 96.00%.  PE: GEN: well developed, well nourished, no acute distress Eyes: conjunctiva and lids normal, PERRLA, EOMI ENT: TM clear, nares clear, oral exam WNL Neck: supple, no lymphadenopathy, no thyromegaly, no JVD Pulm: clear to auscultation and percussion, respiratory effort normal CV: regular rate and rhythm,  S1-S2, no murmur, rub or gallop, no bruits, peripheral pulses normal and symmetric, no cyanosis, clubbing, edema or varicosities Chest: no scars, masses, no gynecomastia   GI: soft, non-tender; no hepatosplenomegaly, masses; active bowel sounds all quadrants GU: no hernia, testicular mass, penile discharge, priapism or prostate enlargement Lymph: no cervical, axillary or inguinal adenopathy MSK: gait normal, muscle tone and strength WNL, no joint swelling, effusions, discoloration, crepitus  SKIN: clear, good turgor, color  WNL, no rashes, lesions, or ulcerations Neuro: normal mental status, normal strength, sensation, and motion Psych: alert; oriented to person, place and time, normally interactive and not anxious or depressed in appearance.       Assessment & Plan:   1. Routine general medical examination at a health care facility : The patient's preventative maintenance and recommended screening tests for an annual wellness exam were reviewed in full today. Brought up to date unless services declined.  Counselled on the importance of diet, exercise, and its role in overall health and mortality. The patient's FH and SH was reviewed, including their home life, tobacco status, and drug and alcohol status.    2. HYPERLIPIDEMIA : cont statin LDL cholesterol, direct  3. HYPERTENSION : cont ca channel blocker   4. MALIG NEOPLASM OTHER SPEC SITES LARGE INTESTINE  CEA, CT Abdomen Pelvis W Wo Contrast, CT Abdomen Pelvis W Contrast  5. RENAL INSUFFICIENCY : f/u labs Basic metabolic panel  6. BENIGN PROSTATIC HYPERTROPHY, WITH URINARY OBSTRUCTION : mildly symptomatic   7. Encounter for long-term (current) use of other medications  CBC with Differential, Hepatic function panel  8. Hyperglycemia : ? H/o elevated BS in past Hemoglobin A1c  9. Liver lesion  CT Abdomen Pelvis W Wo Contrast, CT Abdomen Pelvis W Contrast  10. Renal lesion  CT Abdomen Pelvis W Wo Contrast, CT Abdomen Pelvis W Contrast   Renal lesions x 2 and liver lesions x 2 seen on CT without contrast in [place]. Will obtain at CT of the abdomen and pelvis with and without contrast in this setting of prior colon CA to evaluate for potential metastasis or other primary tumor vs. Simple cyst.

## 2011-05-03 NOTE — Patient Instructions (Signed)
REFERRAL: GO THE THE FRONT ROOM AT THE ENTRANCE OF OUR CLINIC, NEAR CHECK IN. ASK FOR MARION. SHE WILL HELP YOU SET UP YOUR REFERRAL. DATE: TIME:  

## 2011-05-04 LAB — HEMOGLOBIN A1C: Hgb A1c MFr Bld: 5.6 % (ref 4.6–6.5)

## 2011-05-04 LAB — BASIC METABOLIC PANEL
Calcium: 9.6 mg/dL (ref 8.4–10.5)
Chloride: 105 mEq/L (ref 96–112)
Creatinine, Ser: 2.2 mg/dL — ABNORMAL HIGH (ref 0.4–1.5)
GFR: 31.32 mL/min — ABNORMAL LOW (ref >60.00)

## 2011-05-04 LAB — CBC WITH DIFFERENTIAL/PLATELET
Basophils Relative: 0.2 % (ref 0.0–3.0)
Eosinophils Relative: 3.4 % (ref 0.0–5.0)
Lymphocytes Relative: 13.9 % (ref 12.0–46.0)
MCV: 95.2 fl (ref 78.0–100.0)
Monocytes Relative: 9.7 % (ref 3.0–12.0)
Neutrophils Relative %: 72.8 % (ref 43.0–77.0)
RBC: 4.25 Mil/uL (ref 4.22–5.81)
WBC: 7.1 10*3/uL (ref 4.5–10.5)

## 2011-05-04 LAB — HEPATIC FUNCTION PANEL
AST: 28 U/L (ref 0–37)
Alkaline Phosphatase: 59 U/L (ref 39–117)
Total Bilirubin: 0.7 mg/dL (ref 0.3–1.2)

## 2011-05-04 NOTE — Progress Notes (Signed)
Addended by: [name] on: 05/04/2011 02:08 PM   Modules accepted: Orders

## 2011-05-05 ENCOUNTER — Encounter: Payer: Self-pay | Admitting: Family Medicine

## 2011-05-05 ENCOUNTER — Other Ambulatory Visit: Payer: Medicare Other

## 2011-05-06 ENCOUNTER — Other Ambulatory Visit (INDEPENDENT_AMBULATORY_CARE_PROVIDER_SITE_OTHER): Payer: Medicare Other | Admitting: Family Medicine

## 2011-05-06 DIAGNOSIS — N259 Disorder resulting from impaired renal tubular function, unspecified: Secondary | ICD-10-CM

## 2011-05-06 LAB — BASIC METABOLIC PANEL
BUN: 31 mg/dL — ABNORMAL HIGH (ref 6–23)
CO2: 29 mEq/L (ref 19–32)
Chloride: 102 mEq/L (ref 96–112)
Creatinine, Ser: 1.6 mg/dL — ABNORMAL HIGH (ref 0.4–1.5)
Potassium: 3.8 mEq/L (ref 3.5–5.1)

## 2011-05-09 ENCOUNTER — Other Ambulatory Visit: Payer: Self-pay | Admitting: *Deleted

## 2011-05-09 MED ORDER — DILTIAZEM HCL ER COATED BEADS 240 MG PO CP24: 240.0000 mg | ORAL_CAPSULE | Freq: Every day | ORAL | Status: DC

## 2011-06-20 ENCOUNTER — Other Ambulatory Visit: Payer: Self-pay | Admitting: *Deleted

## 2011-06-20 MED ORDER — OMEPRAZOLE 20 MG PO CPDR: 20.0000 mg | DELAYED_RELEASE_CAPSULE | Freq: Every day | ORAL | Status: DC

## 2011-09-16 ENCOUNTER — Ambulatory Visit: Payer: Medicare Other | Admitting: Family Medicine

## 2011-09-16 ENCOUNTER — Ambulatory Visit (INDEPENDENT_AMBULATORY_CARE_PROVIDER_SITE_OTHER): Payer: Medicare Other | Admitting: Family Medicine

## 2011-09-16 ENCOUNTER — Encounter: Payer: Self-pay | Admitting: Family Medicine

## 2011-09-16 DIAGNOSIS — R062 Wheezing: Secondary | ICD-10-CM

## 2011-09-16 DIAGNOSIS — J40 Bronchitis, not specified as acute or chronic: Secondary | ICD-10-CM

## 2011-09-16 MED ORDER — GUAIFENESIN-CODEINE 100-10 MG/5ML PO SYRP: 5.0000 mL | ORAL_SOLUTION | Freq: Every evening | ORAL | Status: AC | PRN

## 2011-09-16 MED ORDER — AZITHROMYCIN 250 MG PO TABS: ORAL_TABLET | ORAL | Status: AC

## 2011-09-16 MED ORDER — ALBUTEROL SULFATE HFA 108 (90 BASE) MCG/ACT IN AERS: 2.0000 | INHALATION_SPRAY | Freq: Four times a day (QID) | RESPIRATORY_TRACT | Status: DC | PRN

## 2011-09-16 NOTE — Patient Instructions (Signed)
Sounds like you have a upper respiratory infection. Likely viral, so treat with cheratussin for cough at night and albuterol inhaler. Use simple mucinex with plenty of fluid to breakup mucous. Viral infections usually take 7-10 days to resolve.  The cough can last several weeks to go away. If worsening cough, or fever stays >101 more than 1-2 more days, fill zpack Push fluids and plenty of rest. Please return if you are not improving as expected, or if you have high fevers (>101.5) or difficulty swallowing or worsening productive cough. Call clinic with questions.  Good to see you today.

## 2011-09-16 NOTE — Progress Notes (Signed)
  Subjective:    Patient ID: Michael [name], male    DOB: October 27, 1931, 78 y.o.   MRN: [id number]  HPI CC: feeling ill  H/o HTN, HLD, stage 3 CKD.  3-4 d h/o feeling ill.  Started with cough productive of white sputum.  + wheezing as well, at end of cough.  Tmax 101 last night.  + HA, and dizziness with standing up.  In bed all day yesterday, weak.  + sore all over, trouble sleeping.  ++ RN but not a lot of mucous.  So far has tried nyquil.    No ST, abd pain nausea/vomiting, chest pain   + sick contacts at home (wife).  No smokers at home, no h/o asthma/COPD.  No flu shot this year.  Review of Systems Per HPI    Objective:   Physical Exam  Nursing note and vitals reviewed. Constitutional: He appears well-developed and well-nourished. No distress.       congsted  HENT:  Head: Normocephalic and atraumatic.  Right Ear: Hearing, tympanic membrane, external ear and ear canal normal.  Left Ear: Hearing, tympanic membrane, external ear and ear canal normal.  Nose: Nose normal. No mucosal edema or rhinorrhea. Right sinus exhibits no maxillary sinus tenderness and no frontal sinus tenderness. Left sinus exhibits no maxillary sinus tenderness and no frontal sinus tenderness.  Mouth/Throat: Uvula is midline, oropharynx is clear and moist and mucous membranes are normal. No oropharyngeal exudate, posterior oropharyngeal edema, posterior oropharyngeal erythema or tonsillar abscesses.  Eyes: Conjunctivae and EOM are normal. Pupils are equal, round, and reactive to light. No scleral icterus.  Neck: Normal range of motion. Neck supple.  Cardiovascular: Normal rate, regular rhythm, normal heart sounds and intact distal pulses.   No murmur heard. Pulmonary/Chest: Effort normal and breath sounds normal. No respiratory distress. He has no wheezes. He has no rales.       Overall clear, minimal exp wheezing  Lymphadenopathy:    He has no cervical adenopathy.  Skin: Skin is warm and dry. No rash  noted.       Assessment & Plan:

## 2011-09-16 NOTE — Assessment & Plan Note (Signed)
Likely viral as early on. Treat with supportive care.  cheratussin for cough at night. If not improving as expected, fill zpack provided today. Albuterol for mild wheezing heard today. Tessalon perls for cough.

## 2011-09-28 ENCOUNTER — Ambulatory Visit (INDEPENDENT_AMBULATORY_CARE_PROVIDER_SITE_OTHER): Payer: Medicare Other | Admitting: Family Medicine

## 2011-09-28 ENCOUNTER — Encounter: Payer: Self-pay | Admitting: Family Medicine

## 2011-09-28 ENCOUNTER — Ambulatory Visit (INDEPENDENT_AMBULATORY_CARE_PROVIDER_SITE_OTHER)
Admission: RE | Admit: 2011-09-28 | Discharge: 2011-09-28 | Disposition: A | Discharge status: Home / Self Care | Payer: Medicare Other | Source: Ambulatory Visit | Attending: Family Medicine | Admitting: Family Medicine

## 2011-09-28 VITALS — BP 120/82 | HR 87 | Temp 97.5°F | Ht 71.0 in | Wt 223.8 lb

## 2011-09-28 DIAGNOSIS — M25569 Pain in unspecified knee: Secondary | ICD-10-CM

## 2011-09-28 DIAGNOSIS — M25562 Pain in left knee: Secondary | ICD-10-CM

## 2011-09-28 DIAGNOSIS — M1712 Unilateral primary osteoarthritis, left knee: Secondary | ICD-10-CM

## 2011-09-28 MED ORDER — TRAMADOL HCL 50 MG PO TABS: 50.0000 mg | ORAL_TABLET | Freq: Four times a day (QID) | ORAL | Status: AC | PRN

## 2011-09-28 NOTE — Progress Notes (Signed)
  Patient Name: Michael [name] Date of Birth: [birth year] Age: 78 y.o. Medical Record Number: [id number] Gender: male Date of Encounter: 09/28/2011  History of Present Illness:  Michael [name] is a 78 y.o. very pleasant male patient who presents with the following:  L arthritic knee on the left. 4-5 years ago, had his knee drained. 1-2 shots then Has been been living with it ever since.   Patient presents with acute on chronic knee pain worsening over the last few months - no focal injury.  The patient has had a large effusion. + symptomatic giving-way. + mechanical clicking. Joint has locked up - he has a floating body that he can feel sometimes in his knee proximally. Patient has been able to walk but is limping. The patient does have pain going up and down stairs or rising from a seated position.   Pain location: diffuse Current physical activity: relatively active for age Prior Knee Surgery: none Current pain meds: occ tylenol Bracing: none   Past Medical History, Surgical History, Social History, Family History, Problem List, Medications, and Allergies have been reviewed and updated if relevant.  Review of Systems:  GEN: No fevers, chills. Nontoxic. Primarily MSK c/o today. MSK: Detailed in the HPI GI: tolerating PO intake without difficulty Neuro: No numbness, parasthesias, or tingling associated. Otherwise the pertinent positives of the ROS are noted above.    Physical Examination: Filed Vitals:   09/28/11 0926  BP: 120/82  Pulse: 87  Temp: 97.5 F (36.4 C)  TempSrc: Oral  Height: 5\' 11"  (1.803 m)  Weight: 223 lb 12.8 oz (101.515 kg)  SpO2: 99%    Body mass index is 31.21 kg/(m^2).   GEN: WDWN, NAD, Non-toxic, Alert & Oriented x 3 HEENT: Atraumatic, Normocephalic.  Ears and Nose: No external deformity. EXTR: No clubbing/cyanosis/edema NEURO: Normal gait.  PSYCH: Normally interactive. Conversant. Not depressed or anxious appearing.  Calm demeanor.   Knee:   L Gait: antalgic gait ROM: 0-115 Effusion: large Echymosis or edema: none Notable crepitus Patellar tendon NT Painful PLICA: neg Patellar grind: POS Medial and lateral patellar facet loading: negative medial and lateral joint lines: mild ttp Mcmurray's pain Flexion-pinch pos Varus and valgus stress: stable Lachman: neg Ant and Post drawer: neg Hip abduction, IR, ER: WNL Hip flexion str: 5/5 Hip abd: 5/5 Quad: 5/5 VMO atrophy:No Hamstring concentric and eccentric: 5/5   Assessment and Plan: 1. Left knee pain  DG Knee Bilateral Standing AP, DG Knee 1-2 Views Left, traMADol (ULTRAM) 50 MG tablet, Ambulatory referral to Orthopedic Surgery  2. Arthritis of knee, left  Ambulatory referral to Orthopedic Surgery    X-rays: AP Bilateral Weight-bearing, Weightbearing Lateral, Sunrise views Indication: knee pain Findings:  Advanced tricompartmental disease with large radiopaque probable loose bodies. Left-sided.  Aspiration and injection of corticosteroid today. The patient is going to [place] for a month, and we are going to try to give him some pain relief until he can see one of the surgeons.  Knee Aspiration and Injection, L Patient verbally consented; risks, benefits, and alternatives explained including possible infection. Patient prepped with Chloraprep. Ethyl chloride for anesthesia. 10 cc of 1% Lidocaine used in wheal then injected Subcutaneous fashion with 27 gauge needle on lateral approach. Under sterilne conditions, 18 gauge needle used via lateral approach to aspirate 30 cc of serosanguionous fluid. Then 9 cc of Lidocaine 1% and 1 cc of Depo-Medrol 40 mg injected. Tolerated well, decreased pain, no complications.  Consult Dr. Merlyn Albert

## 2011-09-28 NOTE — Patient Instructions (Signed)
REFERRAL: GO THE THE FRONT ROOM AT THE ENTRANCE OF OUR CLINIC, NEAR CHECK IN. ASK FOR Michael [name]. SHE WILL HELP YOU SET UP YOUR REFERRAL. DATE: TIME:  

## 2011-10-10 ENCOUNTER — Telehealth: Payer: Self-pay | Admitting: Family Medicine

## 2011-10-10 NOTE — Telephone Encounter (Signed)
Rx called to pharmacy and patients daughter in law advised

## 2011-10-10 NOTE — Telephone Encounter (Signed)
With his kidney function, i would rather him take a 1/2 a pain pill prn for pain  Vicodin 5-500, 1/2 to 1 tab po prn pain q 6 hours prn, #40, 0 refills

## 2011-10-10 NOTE — Telephone Encounter (Signed)
Patient called and said the Tramadol isn't helping with the pain.  He wants to know if you think that Mobic would work. Patient is leaving for [place] in the morning. Patient uses Facilities manager.

## 2011-12-06 ENCOUNTER — Telehealth: Payer: Self-pay | Admitting: Family Medicine

## 2011-12-06 NOTE — Telephone Encounter (Signed)
Dr. Kristeen Miss of [facility] would like for you to return his call concerning this patient's cancelled CT Body Scan on 05/04/12.  He want's to know if you are going to follow up on that because the patient is in his office needing a total knee replacement.  He will be in his office all day today.  Two private telephone numbers are [phone] and [phone].

## 2011-12-06 NOTE — Telephone Encounter (Signed)
Called Dr. Madelon Lips - I am having the patient come in for a formal preop clearance. Discussed this in past with the reading radiologist in [facility] and our radiology dept at [facility]. At the time, per their conversation, the radiologist I discussed on the phone with felt was c/w cyst.  Will readdress at his formal preop visit.

## 2011-12-15 ENCOUNTER — Ambulatory Visit: Payer: Medicare Other | Admitting: Family Medicine

## 2012-01-23 ENCOUNTER — Other Ambulatory Visit: Payer: Self-pay | Admitting: Family Medicine

## 2012-02-07 ENCOUNTER — Other Ambulatory Visit: Payer: Self-pay | Admitting: *Deleted

## 2012-02-07 MED ORDER — TRAMADOL HCL 50 MG PO TABS: 50.0000 mg | ORAL_TABLET | Freq: Four times a day (QID) | ORAL | Status: DC | PRN

## 2012-02-07 NOTE — Telephone Encounter (Signed)
Ok to refill tramadol  Tramadol 50 mg, 1 po qid prn pain, #50, 2 refills

## 2012-02-07 NOTE — Telephone Encounter (Signed)
Patient request refill on tramadol but not on medication list

## 2012-02-07 NOTE — Telephone Encounter (Signed)
rx sent to pharmacy

## 2012-02-10 ENCOUNTER — Other Ambulatory Visit: Payer: Self-pay | Admitting: *Deleted

## 2012-02-10 NOTE — Telephone Encounter (Signed)
OK to refill

## 2012-02-11 MED ORDER — TRAMADOL HCL 50 MG PO TABS: 50.0000 mg | ORAL_TABLET | Freq: Four times a day (QID) | ORAL | Status: AC | PRN

## 2012-02-11 NOTE — Telephone Encounter (Signed)
refilled 

## 2012-02-22 ENCOUNTER — Encounter (HOSPITAL_COMMUNITY): Payer: Self-pay | Admitting: Pharmacy Technician

## 2012-02-22 ENCOUNTER — Other Ambulatory Visit: Payer: Self-pay | Admitting: Orthopedic Surgery

## 2012-02-22 NOTE — Progress Notes (Signed)
Preoperative surgical orders have been place into the Epic hospital system for Michael [name] on 02/22/2012, 4:28 PM  by Patrica Duel for surgery on 03/05/2012.  Preop Total Knee orders including Bupivacaine On-Q pump, IV Tylenol, and IV Decadron as long as there are no contraindications to the above medications. Avel Peace, PA-C

## 2012-02-23 ENCOUNTER — Ambulatory Visit (HOSPITAL_COMMUNITY)
Admission: RE | Admit: 2012-02-23 | Discharge: 2012-02-23 | Disposition: A | Discharge status: Home / Self Care | Payer: Medicare Other | Source: Ambulatory Visit | Attending: Orthopedic Surgery | Admitting: Orthopedic Surgery

## 2012-02-23 ENCOUNTER — Ambulatory Visit (INDEPENDENT_AMBULATORY_CARE_PROVIDER_SITE_OTHER): Payer: Medicare Other | Admitting: Family Medicine

## 2012-02-23 ENCOUNTER — Encounter: Payer: Self-pay | Admitting: Family Medicine

## 2012-02-23 ENCOUNTER — Encounter (HOSPITAL_COMMUNITY)
Admission: RE | Admit: 2012-02-23 | Discharge: 2012-02-23 | Disposition: A | Discharge status: Home / Self Care | Payer: Medicare Other | Source: Ambulatory Visit | Attending: Orthopedic Surgery | Admitting: Orthopedic Surgery

## 2012-02-23 ENCOUNTER — Encounter (HOSPITAL_COMMUNITY): Payer: Self-pay

## 2012-02-23 VITALS — BP 120/82 | HR 89 | Temp 98.1°F | Ht 71.0 in | Wt 209.2 lb

## 2012-02-23 DIAGNOSIS — I10 Essential (primary) hypertension: Secondary | ICD-10-CM

## 2012-02-23 DIAGNOSIS — Z01812 Encounter for preprocedural laboratory examination: Secondary | ICD-10-CM | POA: Insufficient documentation

## 2012-02-23 DIAGNOSIS — Z01818 Encounter for other preprocedural examination: Secondary | ICD-10-CM | POA: Insufficient documentation

## 2012-02-23 DIAGNOSIS — R935 Abnormal findings on diagnostic imaging of other abdominal regions, including retroperitoneum: Secondary | ICD-10-CM

## 2012-02-23 DIAGNOSIS — C189 Malignant neoplasm of colon, unspecified: Secondary | ICD-10-CM

## 2012-02-23 DIAGNOSIS — M1710 Unilateral primary osteoarthritis, unspecified knee: Secondary | ICD-10-CM | POA: Insufficient documentation

## 2012-02-23 DIAGNOSIS — Z79899 Other long term (current) drug therapy: Secondary | ICD-10-CM

## 2012-02-23 HISTORY — DX: Unspecified osteoarthritis, unspecified site: M19.90

## 2012-02-23 LAB — CBC WITH DIFFERENTIAL/PLATELET
Basophils Relative: 0.9 % (ref 0.0–3.0)
HCT: 39.8 % (ref 39.0–52.0)
Hemoglobin: 13.3 g/dL (ref 13.0–17.0)
Lymphocytes Relative: 16.4 % (ref 12.0–46.0)
Lymphs Abs: 1.2 10*3/uL (ref 0.7–4.0)
MCHC: 33.5 g/dL (ref 30.0–36.0)
Monocytes Relative: 11.4 % (ref 3.0–12.0)
Neutro Abs: 4.8 10*3/uL (ref 1.4–7.7)
RBC: 4.16 Mil/uL — ABNORMAL LOW (ref 4.22–5.81)

## 2012-02-23 LAB — BASIC METABOLIC PANEL
BUN: 28 mg/dL — ABNORMAL HIGH (ref 6–23)
CO2: 27 mEq/L (ref 19–32)
Chloride: 104 mEq/L (ref 96–112)
Creatinine, Ser: 1.8 mg/dL — ABNORMAL HIGH (ref 0.4–1.5)

## 2012-02-23 LAB — APTT: aPTT: 30 seconds (ref 24–37)

## 2012-02-23 LAB — URINALYSIS, ROUTINE W REFLEX MICROSCOPIC
Bilirubin Urine: NEGATIVE
Glucose, UA: NEGATIVE mg/dL
Hgb urine dipstick: NEGATIVE
Protein, ur: NEGATIVE mg/dL

## 2012-02-23 LAB — HEPATIC FUNCTION PANEL
ALT: 21 U/L (ref 0–53)
Albumin: 4 g/dL (ref 3.5–5.2)
Total Protein: 7 g/dL (ref 6.0–8.3)

## 2012-02-23 LAB — SURGICAL PCR SCREEN: MRSA, PCR: NEGATIVE

## 2012-02-23 MED ORDER — ZOLPIDEM TARTRATE 10 MG PO TABS: 10.0000 mg | ORAL_TABLET | Freq: Every evening | ORAL | Status: DC | PRN

## 2012-02-23 NOTE — Progress Notes (Signed)
Patient Name: Michael [name] Date of Birth: 1932-07-15 Medical Record Number: [id number] Gender: male  Dear Dr. Merlyn Albert,  Thank you for having me see Michael [name] in consultation today at [facility] at [facility] for his problem with Left knee pain for preoperative evaluation.  As you may recall, he is a 78 y.o. year old male with a history of longstanding L knee severe OA, scheduled for T TKA on 03/05/2012.   Able to swim 5-10 laps in the swimming pool, > 4 met equivalent. CRI at baseline, but last Cr < 2  Basic Metabolic Panel:    Component Value Date/Time   NA 139 05/06/2011 1041   K 3.8 05/06/2011 1041   CL 102 05/06/2011 1041   CO2 29 05/06/2011 1041   BUN 31* 05/06/2011 1041   CREATININE 1.6* 05/06/2011 1041   GLUCOSE 77 05/06/2011 1041   CALCIUM 9.0 05/06/2011 1041   Historically, h/o colon CA distantly, and had a CT of his abdomen that picked up some ? Cysts without contrast in kidney and liver > 1 year ago that needs f/u evaluation.  Past Medical History  Diagnosis Date  . Colon cancer 1988  . Ventral hernia 1988  . HLD (hyperlipidemia)   . Renal insufficiency   . GERD (gastroesophageal reflux disease)   . Rotator cuff rupture     Right; s/p repair  . HTN (hypertension)   . BPH (benign prostatic hypertrophy)   . Abnormality of gait   . Unspecified hearing loss   . Impotence of organic origin     Past Surgical History  Procedure Date  . Hemicoloectomy w/ anastomosis 1988    [name]  . Ventral hernia repair [facility]  . Rotator cuff repair     Right; [facility]    History   Social History  . Marital Status: Married    Spouse Name: N/A    Number of Children: N/A  . Years of Education: N/A   Social History Main Topics  . Smoking status: Never Smoker   . Smokeless tobacco: None  . Alcohol Use: Yes     Wine (1/2-1 glass 2x/week  . Drug Use: No  . Sexually Active: None   Other Topics Concern  . 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 Osteoarthritis Father     Medications and Allergies reviewed  Review of Systems:    GEN: No acute illnesses, no fevers, chills. GI: No n/v/d, eating normally Pulm: No SOB Knee pain Intermittent back pain with some L sided radiculopathy Otherwise, the pertinent positives and negatives are listed above and in the HPI, otherwise a full review of systems has been reviewed and is negative unless noted positive.    Physical Examination: Filed Vitals:   02/23/12 1035  BP: 120/82  Pulse: 89  Temp: 98.1 F (36.7 C)  TempSrc: Oral  Height: 5\' 11"  (1.803 m)  Weight: 209 lb 4 oz (94.915 kg)  SpO2: 98%      GEN: WDWN, NAD, Non-toxic, A & O x 3 HEENT: Atraumatic, Normocephalic. Neck supple. No masses, No LAD. Ears and Nose: No external deformity. CV: RRR, No M/G/R. No JVD. No thrill. No extra heart sounds. PULM: CTA B, no wheezes, crackles, rhonchi. No retractions. No resp.  distress. No accessory muscle use. EXTR: No c/c/e NEURO Normal gait.  PSYCH: Normally interactive. Conversant. Not depressed or anxious appearing.  Calm demeanor.    EKG: Normal sinus rhythm. Normal axis, normal R wave progression, No acute ST elevation or depression.   Assessment and Plan:  Impression:  1. Pre-op examination  EKG 12-Lead, CBC with Differential, Basic metabolic panel, Hepatic function panel  2. Arthritis of knee    3. Abnormal abdominal CT scan  CT Abdomen Pelvis W Contrast  4. Encounter for long-term (current) use of other medications  CBC with Differential, Basic metabolic panel, Hepatic function panel  5. CKD (chronic kidney disease) stage 3, GFR 30-59 ml/min    6. MALIG NEOPLASM OTHER SPEC SITES LARGE INTESTINE    7. HYPERTENSION        Recommendations: Potential benefits of surgery outweigh risks. No further cardiac testing is indicated.  We will f/u his abnormal CT of the abdomen without contrast with a f/u CT with contrast, check BMP prior, to evaluate for potential neoplasm of kidney or liver. This can be done in the next couple weeks and should not prevent surgery.  We will see the patient back for routine f/u only.  Thank you for having [place] see Michael [name] in consultation.  Feel free to contact me with any questions.  Charmain Diosdado T. Kaysen Sefcik, MD, CAQ Sports Medicine [employer] at [facility] [address] [place], [place] [address] Phone: [phone] Fax: [phone]

## 2012-02-23 NOTE — Patient Instructions (Signed)
[address] ZALEN SEQUEIRA  02/23/2012   Your procedure is scheduled on: 03/05/12   Surgery   [id number]  MONDAY   Report to [facility] at   0845    AM.  Call this number if you have problems the morning of surgery: [phone]     Or PST   [id number]  [facility]   Remember:   Do not eat food  Or fluids after :After Midnight.  SUNDAY      Take these medicines the morning of surgery with A SIP OF WATER: DILITIAZEM, PROLISEC,         TRAMADOL IF NEEDED   Do not wear jewelry, make-up or nail polish.  Do not wear lotions, powders, or perfumes. You may wear deodorant.  Do not shave 48 hours prior to surgery.  Do not bring valuables to the hospital.  Contacts, dentures or bridgework may not be worn into surgery.  Leave suitcase in the car. After surgery it may be brought to your room.  For patients admitted to the hospital, checkout time is 11:00 AM the day of discharge.   Patients discharged the day of surgery will not be allowed to drive home.  Name and phone number of your driver:       ELLA  wife                                                               Special Instructions: CHG Shower Use Special Wash: 1/2 bottle night before surgery and 1/2 bottle morning of surgery. REGULAR SOAP FACE AND PRIVATES                       MEN-MAY SHAVE FACE MORNING OF SURGERY  Please read over the following fact sheets that you were given: MRSA Information

## 2012-02-23 NOTE — Patient Instructions (Addendum)
REFERRAL: GO THE THE FRONT ROOM AT THE ENTRANCE OF OUR CLINIC, NEAR CHECK IN. ASK FOR MARION. SHE WILL HELP YOU SET UP YOUR REFERRAL. DATE: TIME:  

## 2012-02-27 ENCOUNTER — Telehealth: Payer: Self-pay

## 2012-02-27 NOTE — Pre-Procedure Instructions (Signed)
Spoke with [facility] PA at Dr Salina April regarding abnormal BUN and creatnine

## 2012-02-27 NOTE — Telephone Encounter (Signed)
Toniann Fail with Dr Despina Hick office left v/m requesting medical clearance info. I faxed per Dr Cyndie Chime instruction 02/23/12 visit to [phone] and left Toniann Fail v/m that info was faxed.

## 2012-02-27 NOTE — Pre-Procedure Instructions (Signed)
Left message with Junious Dresser at [facility] of abnormal BMET report on voice mail and asked for call back.  Notified Belenda Cruise at [facility] who sent message intradepartmental mail to provider to review labs  [phone] 02/27/12

## 2012-02-28 ENCOUNTER — Encounter (HOSPITAL_COMMUNITY): Payer: Self-pay

## 2012-02-29 ENCOUNTER — Ambulatory Visit (INDEPENDENT_AMBULATORY_CARE_PROVIDER_SITE_OTHER)
Admission: RE | Admit: 2012-02-29 | Discharge: 2012-02-29 | Disposition: A | Discharge status: Home / Self Care | Payer: Medicare Other | Source: Ambulatory Visit | Attending: Family Medicine | Admitting: Family Medicine

## 2012-02-29 DIAGNOSIS — R935 Abnormal findings on diagnostic imaging of other abdominal regions, including retroperitoneum: Secondary | ICD-10-CM

## 2012-02-29 MED ORDER — IOHEXOL 300 MG/ML  SOLN
80.0000 mL | Freq: Once | INTRAMUSCULAR | Status: AC | PRN
  Administered 2012-02-29: 80 mL via INTRAVENOUS

## 2012-03-04 ENCOUNTER — Other Ambulatory Visit: Payer: Self-pay | Admitting: Orthopedic Surgery

## 2012-03-04 NOTE — H&P (Signed)
Michael [name]  DOB: 05/11/1932 Married / Language: Undefined / Race: White Male  Date of Admission:  03/05/2012  Chief Complaint:  Right Knee Pain  History of Present Illness The patient is a 78 year old male who comes in for a preoperative History and Physical. The patient is scheduled for a left total knee arthroplasty to be performed by Dr. Frank V. Aluisio, MD at [facility] on 03/05/2012. The patient is a 78 year old male who presents with knee complaints. The patient is seen today for a second opinion. The patient reports left knee symptoms including: pain, instability, weakness and stiffness which began 10 year(s) ago without any known injury.The patient feels that the symptoms are worsening. The patient has the current diagnosis of knee osteoarthritis. Prior to being seen today the patient was previously evaluated by a colleague 5 year(s) ago. Previous work-up for this problem has included knee x-rays and knee MRI. Past treatment for this problem has included intra-articular injection of corticosteroids (and aspiration). Current treatment includes nonsteroidal anti-inflammatory drugs (ibuprofen or Aleve prn). Note for "Knee pain": previous MRI has shown loose body; he has never had this removed. He states he has learned to manipulate it out of painful areas. His left knee is far more symptomatic than his right. He has had cortisone injections in the past without benefit. He may have had one series of Visco supplements also without benefit. At this point, the knee is hurting at all times preventing him from doing what he wants to do. He also will get significant mechanical type symptoms in the left. He said he has a loose piece that will tend to migrate and make his knee lock up. He said he can't predict when this will happen. When it has happened in the past, it has almost caused him to fall. He said he is much better now and manipulating out into a lateral position and  preventing it from occurring. He is at a stage now where he wants to get this knee fixed. They have been treated conservatively in the past for the above stated problem and despite conservative measures, they continue to have progressive pain and severe functional limitations and dysfunction. They have failed non-operative management. It is felt that they would benefit from undergoing total joint replacement. Risks and benefits of the procedure have been discussed with the patient and they elect to proceed with surgery. There are no active contraindications to surgery such as ongoing infection or rapidly progressive neurological disease.      Problem List/Past Medical Osteoarthritis, Knee (715.96) Hypercholesterolemia High blood pressure Colon Cancer Impaired Vision Impaired Hearing Varicose veins Hiatal Hernia Gastroesophageal Reflux Disease Kidney Stone Measles Mumps   Allergies No Known Drug Allergies   Family History Osteoarthritis. father Father. Deceased. age 66 Mother. Deceased, Congestive Heart Failure. age 85   Social History Marital status. married Living situation. live with spouse Illicit drug use. no Tobacco use. never smoker Pain Contract. no Number of flights of stairs before winded. 4-5 Current work status. retired Children. 3 Alcohol use. never consumed alcohol Exercise. Exercises rarely; does running / walking Drug/Alcohol Rehab (Previously). no Drug/Alcohol Rehab (Currently). no Post-Surgical Plans. Plan is to go home.   Medication History Lovastatin (20MG Tablet, Oral daily) Active. Diltiazem HCl CR (240MG/24HR Capsule ER 24HR, Oral daily) Active. Omeprazole (20MG Capsule DR, Oral daily) Active. Aspirin EC (325MG Tablet DR, Oral daily) Active. TraMADol HCl (50MG Tablet, Oral) Active. Multivitamin ( Oral) Active. Fish Oil Burp-Less (500MG Capsule,   Oral) Active.   Past Surgical History Vasectomy Colon Polyp Removal  - Colonoscopy Colectomy. Partial Umbilical Heria Repair Rotator Cuff Repair - Right     Review of Systems General:Not Present- Chills, Fever, Night Sweats, Fatigue, Weight Gain, Weight Loss and Memory Loss. Skin:Not Present- Hives, Itching, Rash, Eczema and Lesions. HEENT:Present- Tinnitus and Hearing Loss. Not Present- Headache, Double Vision, Visual Loss and Dentures. Respiratory:Not Present- Shortness of breath with exertion, Shortness of breath at rest, Allergies, Coughing up blood and Chronic Cough. Cardiovascular:Not Present- Chest Pain, Racing/skipping heartbeats, Difficulty Breathing Lying Down, Murmur, Swelling and Palpitations. Gastrointestinal:Not Present- Bloody Stool, Heartburn, Abdominal Pain, Vomiting, Nausea, Constipation, Diarrhea, Difficulty Swallowing, Jaundice and Loss of appetitie. Male Genitourinary:Not Present- Urinary frequency, Blood in Urine, Weak urinary stream, Discharge, Flank Pain, Incontinence, Painful Urination, Urgency, Urinary Retention and Urinating at Night. Musculoskeletal:Present- Joint Pain and Back Pain. Not Present- Muscle Weakness, Muscle Pain, Joint Swelling, Morning Stiffness and Spasms. Neurological:Not Present- Tremor, Dizziness, Blackout spells, Paralysis, Difficulty with balance and Weakness. Psychiatric:Not Present- Insomnia.   Vitals Weight: 214 lb Height: 70 in Body Surface Area: 2.19 m Body Mass Index: 30.71 kg/m Pulse: 72 (Regular) Resp.: 16 (Unlabored) BP: 106/72 (Sitting, Right Arm, Standard)    Physical Exam The physical exam findings are as follows:  Patient is a 78 year old male with continued bilateral knee pain.  Patient is accompanied today by his wife.   General Mental Status - Alert, cooperative and good historian. General Appearance- pleasant. Not in acute distress. Orientation- Oriented X3. Build & Nutrition- Well nourished and Well developed.   Head and Neck Head-  normocephalic, atraumatic . Neck Global Assessment- supple. no bruit auscultated on the right and no bruit auscultated on the left.   Eye Pupil- Bilateral- Regular and Round. Motion- Bilateral- EOMI.   Chest and Lung Exam Auscultation: Breath sounds:- clear at anterior chest wall and - clear at posterior chest wall. Adventitious sounds:- No Adventitious sounds.   Cardiovascular Auscultation:Rhythm- Regular rate and rhythm (did have an occasional pause or skipped beat on auscultation.). Heart Sounds- S1 WNL and S2 WNL. Murmurs & Other Heart Sounds:Auscultation of the heart reveals - No Murmurs.   Abdomen Palpation/Percussion:Tenderness- Abdomen is non-tender to palpation. Rigidity (guarding)- Abdomen is soft. Auscultation:Auscultation of the abdomen reveals - Bowel sounds normal.   Male Genitourinary Not done, not pertinent to present illness  Musculoskeletal He is alert and oriented in no apparent distress. Both hips have normal range of motion with no discomfort. The left knee shows no effusion. I can palpate that loose body. It is out in the lateral gutter. It is mobile. His range of motion is about 10 to 120 on that side. There is marked crepitus on range of motion. There is no instability noted. He is tender medial greater than lateral. Right knee with no effusion. Range is 5 to 125, marked crepitus on range of motion, tender medial greater than lateral with no instability. Pulse, sensation and motor are intact in both lower extremities.  RADIOGRAPHS: AP and lateral of both knees shows advanced endstage arthritis of both knees, bone on bone lateral, patellofemoral and medial on the left and then medial and patellofemoral on the right. He has large loose body on the left knee which is calcified. He has significant subchondral sclerosis of both tibias.  Assessment & Plan Osteoarthritis Left Knee  Note: Patient is for a Left Total Knee Repalcement  by Dr. Aluisio.  Plan is to go home after the surgery.  PCP - Dr. Spencer   Copeland  Signed electronically by DREW L Keaundre Thelin, PA-C 

## 2012-03-05 ENCOUNTER — Inpatient Hospital Stay (HOSPITAL_COMMUNITY)
Admission: RE | Admit: 2012-03-05 | Discharge: 2012-03-08 | DRG: 470 | Disposition: A | Discharge status: Home Health | Payer: Medicare Other | Source: Ambulatory Visit | Attending: Orthopedic Surgery | Admitting: Orthopedic Surgery

## 2012-03-05 ENCOUNTER — Encounter (HOSPITAL_COMMUNITY)
Admission: RE | Disposition: A | Discharge status: Home Health | Payer: Self-pay | Source: Ambulatory Visit | Attending: Orthopedic Surgery

## 2012-03-05 ENCOUNTER — Encounter (HOSPITAL_COMMUNITY): Payer: Self-pay | Admitting: Anesthesiology

## 2012-03-05 ENCOUNTER — Encounter (HOSPITAL_COMMUNITY): Payer: Self-pay | Admitting: *Deleted

## 2012-03-05 ENCOUNTER — Ambulatory Visit (HOSPITAL_COMMUNITY): Payer: Medicare Other | Admitting: Anesthesiology

## 2012-03-05 DIAGNOSIS — D62 Acute posthemorrhagic anemia: Secondary | ICD-10-CM

## 2012-03-05 DIAGNOSIS — K219 Gastro-esophageal reflux disease without esophagitis: Secondary | ICD-10-CM | POA: Diagnosis present

## 2012-03-05 DIAGNOSIS — E785 Hyperlipidemia, unspecified: Secondary | ICD-10-CM | POA: Diagnosis present

## 2012-03-05 DIAGNOSIS — N400 Enlarged prostate without lower urinary tract symptoms: Secondary | ICD-10-CM | POA: Diagnosis present

## 2012-03-05 DIAGNOSIS — M179 Osteoarthritis of knee, unspecified: Secondary | ICD-10-CM | POA: Diagnosis present

## 2012-03-05 DIAGNOSIS — Z85038 Personal history of other malignant neoplasm of large intestine: Secondary | ICD-10-CM

## 2012-03-05 DIAGNOSIS — Z87442 Personal history of urinary calculi: Secondary | ICD-10-CM

## 2012-03-05 DIAGNOSIS — M1710 Unilateral primary osteoarthritis, unspecified knee: Principal | ICD-10-CM | POA: Diagnosis present

## 2012-03-05 DIAGNOSIS — I10 Essential (primary) hypertension: Secondary | ICD-10-CM | POA: Diagnosis present

## 2012-03-05 DIAGNOSIS — Z96659 Presence of unspecified artificial knee joint: Secondary | ICD-10-CM

## 2012-03-05 DIAGNOSIS — Z79899 Other long term (current) drug therapy: Secondary | ICD-10-CM

## 2012-03-05 DIAGNOSIS — E7800 Pure hypercholesterolemia, unspecified: Secondary | ICD-10-CM | POA: Diagnosis present

## 2012-03-05 HISTORY — PX: TOTAL KNEE ARTHROPLASTY: SHX125

## 2012-03-05 LAB — TYPE AND SCREEN: Antibody Screen: NEGATIVE

## 2012-03-05 SURGERY — ARTHROPLASTY, KNEE, TOTAL
Anesthesia: Spinal | Site: Knee | Laterality: Left | Wound class: Clean

## 2012-03-05 MED ORDER — WARFARIN - PHARMACIST DOSING INPATIENT
Freq: Every day | Status: DC
  Administered 2012-03-07: 18:00:00

## 2012-03-05 MED ORDER — CEFAZOLIN SODIUM 1-5 GM-% IV SOLN
1.0000 g | Freq: Four times a day (QID) | INTRAVENOUS | Status: AC
  Administered 2012-03-05 (×2): 1 g via INTRAVENOUS
  Filled 2012-03-05 (×2): qty 50

## 2012-03-05 MED ORDER — LACTATED RINGERS IV SOLN: INTRAVENOUS | Status: DC

## 2012-03-05 MED ORDER — BUPIVACAINE 0.25 % ON-Q PUMP SINGLE CATH 300ML
INJECTION | Status: AC
  Filled 2012-03-05: qty 300

## 2012-03-05 MED ORDER — PROPOFOL 10 MG/ML IV EMUL
INTRAVENOUS | Status: DC | PRN
  Administered 2012-03-05: 50 ug/kg/min via INTRAVENOUS

## 2012-03-05 MED ORDER — PHENOL 1.4 % MT LIQD
1.0000 | OROMUCOSAL | Status: DC | PRN
  Filled 2012-03-05: qty 177

## 2012-03-05 MED ORDER — METOCLOPRAMIDE HCL 5 MG/ML IJ SOLN
5.0000 mg | Freq: Three times a day (TID) | INTRAMUSCULAR | Status: DC | PRN
  Filled 2012-03-05: qty 2

## 2012-03-05 MED ORDER — OXYCODONE HCL 5 MG PO TABS
5.0000 mg | ORAL_TABLET | ORAL | Status: DC | PRN
  Administered 2012-03-07 – 2012-03-08 (×6): 5 mg via ORAL
  Administered 2012-03-08 (×2): 10 mg via ORAL
  Filled 2012-03-05: qty 2
  Filled 2012-03-05 (×4): qty 1
  Filled 2012-03-05: qty 2
  Filled 2012-03-05 (×2): qty 1

## 2012-03-05 MED ORDER — ACETAMINOPHEN 325 MG PO TABS
650.0000 mg | ORAL_TABLET | Freq: Four times a day (QID) | ORAL | Status: DC | PRN
  Administered 2012-03-08: 650 mg via ORAL
  Filled 2012-03-05: qty 2

## 2012-03-05 MED ORDER — BUPIVACAINE IN DEXTROSE 0.75-8.25 % IT SOLN
INTRATHECAL | Status: DC | PRN
  Administered 2012-03-05: 2 mL via INTRATHECAL

## 2012-03-05 MED ORDER — LIDOCAINE HCL (CARDIAC) 20 MG/ML IV SOLN
INTRAVENOUS | Status: DC | PRN
  Administered 2012-03-05: 50 mg via INTRAVENOUS

## 2012-03-05 MED ORDER — WARFARIN VIDEO
Freq: Once | Status: AC
  Administered 2012-03-06 (×2)

## 2012-03-05 MED ORDER — ACETAMINOPHEN 650 MG RE SUPP: 650.0000 mg | Freq: Four times a day (QID) | RECTAL | Status: DC | PRN

## 2012-03-05 MED ORDER — ONDANSETRON HCL 4 MG/2ML IJ SOLN: 4.0000 mg | Freq: Four times a day (QID) | INTRAMUSCULAR | Status: DC | PRN

## 2012-03-05 MED ORDER — ACETAMINOPHEN 10 MG/ML IV SOLN
1000.0000 mg | Freq: Four times a day (QID) | INTRAVENOUS | Status: AC
  Administered 2012-03-05 – 2012-03-06 (×4): 1000 mg via INTRAVENOUS
  Filled 2012-03-05 (×5): qty 100

## 2012-03-05 MED ORDER — SODIUM CHLORIDE 0.9 % IR SOLN
Status: DC | PRN
  Administered 2012-03-05: 1000 mL

## 2012-03-05 MED ORDER — MIDAZOLAM HCL 5 MG/5ML IJ SOLN
INTRAMUSCULAR | Status: DC | PRN
  Administered 2012-03-05: 2 mg via INTRAVENOUS

## 2012-03-05 MED ORDER — BUPIVACAINE ON-Q PAIN PUMP (FOR ORDER SET NO CHG)
INJECTION | Status: DC
  Filled 2012-03-05: qty 1

## 2012-03-05 MED ORDER — CEFAZOLIN SODIUM-DEXTROSE 2-3 GM-% IV SOLR
2.0000 g | INTRAVENOUS | Status: AC
  Administered 2012-03-05: 2 g via INTRAVENOUS

## 2012-03-05 MED ORDER — SODIUM CHLORIDE 0.9 % IJ SOLN: 9.0000 mL | INTRAMUSCULAR | Status: DC | PRN

## 2012-03-05 MED ORDER — PANTOPRAZOLE SODIUM 40 MG PO TBEC
40.0000 mg | DELAYED_RELEASE_TABLET | Freq: Every day | ORAL | Status: DC
  Filled 2012-03-05: qty 1

## 2012-03-05 MED ORDER — FENTANYL CITRATE 0.05 MG/ML IJ SOLN
INTRAMUSCULAR | Status: DC | PRN
  Administered 2012-03-05 (×2): 50 ug via INTRAVENOUS

## 2012-03-05 MED ORDER — BISACODYL 10 MG RE SUPP: 10.0000 mg | Freq: Every day | RECTAL | Status: DC | PRN

## 2012-03-05 MED ORDER — METHOCARBAMOL 500 MG PO TABS
500.0000 mg | ORAL_TABLET | Freq: Four times a day (QID) | ORAL | Status: DC | PRN
  Administered 2012-03-06 – 2012-03-08 (×5): 500 mg via ORAL
  Filled 2012-03-05 (×5): qty 1

## 2012-03-05 MED ORDER — PROMETHAZINE HCL 25 MG/ML IJ SOLN: 6.2500 mg | INTRAMUSCULAR | Status: DC | PRN

## 2012-03-05 MED ORDER — LACTATED RINGERS IV SOLN
INTRAVENOUS | Status: DC
  Administered 2012-03-05 (×2): via INTRAVENOUS
  Administered 2012-03-05: 1000 mL via INTRAVENOUS

## 2012-03-05 MED ORDER — DEXAMETHASONE SODIUM PHOSPHATE 10 MG/ML IJ SOLN
10.0000 mg | Freq: Once | INTRAMUSCULAR | Status: AC
  Administered 2012-03-05: 10 mg via INTRAVENOUS
  Filled 2012-03-05: qty 1

## 2012-03-05 MED ORDER — MENTHOL 3 MG MT LOZG
1.0000 | LOZENGE | OROMUCOSAL | Status: DC | PRN
  Filled 2012-03-05: qty 9

## 2012-03-05 MED ORDER — TRAMADOL HCL 50 MG PO TABS: 50.0000 mg | ORAL_TABLET | Freq: Four times a day (QID) | ORAL | Status: DC | PRN

## 2012-03-05 MED ORDER — NALOXONE HCL 0.4 MG/ML IJ SOLN: 0.4000 mg | INTRAMUSCULAR | Status: DC | PRN

## 2012-03-05 MED ORDER — MORPHINE SULFATE (PF) 1 MG/ML IV SOLN
INTRAVENOUS | Status: DC
  Administered 2012-03-05: 1 mg via INTRAVENOUS
  Administered 2012-03-05: 20 mg via INTRAVENOUS
  Administered 2012-03-06: 21 mg via INTRAVENOUS
  Administered 2012-03-06: 18 mg via INTRAVENOUS
  Filled 2012-03-05 (×3): qty 25

## 2012-03-05 MED ORDER — DILTIAZEM HCL ER COATED BEADS 240 MG PO CP24
240.0000 mg | ORAL_CAPSULE | Freq: Every day | ORAL | Status: DC
  Administered 2012-03-06 – 2012-03-08 (×3): 240 mg via ORAL
  Filled 2012-03-05 (×3): qty 1

## 2012-03-05 MED ORDER — SODIUM CHLORIDE 0.9 % IV SOLN: INTRAVENOUS | Status: DC

## 2012-03-05 MED ORDER — MORPHINE SULFATE (PF) 1 MG/ML IV SOLN
INTRAVENOUS | Status: AC
  Filled 2012-03-05: qty 25

## 2012-03-05 MED ORDER — HYDROMORPHONE HCL PF 1 MG/ML IJ SOLN: 0.2500 mg | INTRAMUSCULAR | Status: DC | PRN

## 2012-03-05 MED ORDER — POLYETHYLENE GLYCOL 3350 17 G PO PACK: 17.0000 g | PACK | Freq: Every day | ORAL | Status: DC | PRN

## 2012-03-05 MED ORDER — COUMADIN BOOK
Freq: Once | Status: AC
  Administered 2012-03-06: 10:00:00
  Filled 2012-03-05: qty 1

## 2012-03-05 MED ORDER — ACETAMINOPHEN 10 MG/ML IV SOLN
INTRAVENOUS | Status: AC
  Filled 2012-03-05: qty 100

## 2012-03-05 MED ORDER — METHOCARBAMOL 100 MG/ML IJ SOLN
500.0000 mg | Freq: Four times a day (QID) | INTRAVENOUS | Status: DC | PRN
  Administered 2012-03-05: 500 mg via INTRAVENOUS
  Filled 2012-03-05 (×2): qty 5

## 2012-03-05 MED ORDER — SIMVASTATIN 10 MG PO TABS
10.0000 mg | ORAL_TABLET | Freq: Every day | ORAL | Status: DC
  Administered 2012-03-05 – 2012-03-07 (×3): 10 mg via ORAL
  Filled 2012-03-05 (×4): qty 1

## 2012-03-05 MED ORDER — DIPHENHYDRAMINE HCL 50 MG/ML IJ SOLN: 12.5000 mg | Freq: Four times a day (QID) | INTRAMUSCULAR | Status: DC | PRN

## 2012-03-05 MED ORDER — DOCUSATE SODIUM 100 MG PO CAPS
100.0000 mg | ORAL_CAPSULE | Freq: Two times a day (BID) | ORAL | Status: DC
  Administered 2012-03-05 – 2012-03-08 (×5): 100 mg via ORAL

## 2012-03-05 MED ORDER — EPHEDRINE SULFATE 50 MG/ML IJ SOLN
INTRAMUSCULAR | Status: DC | PRN
  Administered 2012-03-05: 5 mg via INTRAVENOUS

## 2012-03-05 MED ORDER — BUPIVACAINE 0.25 % ON-Q PUMP SINGLE CATH 300ML
300.0000 mL | INJECTION | Status: DC
  Filled 2012-03-05: qty 300

## 2012-03-05 MED ORDER — WARFARIN SODIUM 7.5 MG PO TABS
7.5000 mg | ORAL_TABLET | Freq: Once | ORAL | Status: AC
  Administered 2012-03-05: 7.5 mg via ORAL
  Filled 2012-03-05: qty 1

## 2012-03-05 MED ORDER — ACETAMINOPHEN 10 MG/ML IV SOLN
1000.0000 mg | Freq: Once | INTRAVENOUS | Status: AC
  Administered 2012-03-05: 1000 mg via INTRAVENOUS
  Filled 2012-03-05: qty 100

## 2012-03-05 MED ORDER — METOCLOPRAMIDE HCL 10 MG PO TABS: 5.0000 mg | ORAL_TABLET | Freq: Three times a day (TID) | ORAL | Status: DC | PRN

## 2012-03-05 MED ORDER — ACETAMINOPHEN 10 MG/ML IV SOLN: INTRAVENOUS | Status: DC | PRN

## 2012-03-05 MED ORDER — ONDANSETRON HCL 4 MG PO TABS: 4.0000 mg | ORAL_TABLET | Freq: Four times a day (QID) | ORAL | Status: DC | PRN

## 2012-03-05 MED ORDER — SODIUM CHLORIDE 0.9 % IV SOLN
INTRAVENOUS | Status: DC
  Administered 2012-03-05 – 2012-03-06 (×2): via INTRAVENOUS

## 2012-03-05 MED ORDER — FLEET ENEMA 7-19 GM/118ML RE ENEM: 1.0000 | ENEMA | Freq: Once | RECTAL | Status: AC | PRN

## 2012-03-05 MED ORDER — DIPHENHYDRAMINE HCL 12.5 MG/5ML PO ELIX: 12.5000 mg | ORAL_SOLUTION | ORAL | Status: DC | PRN

## 2012-03-05 MED ORDER — DIPHENHYDRAMINE HCL 12.5 MG/5ML PO ELIX: 12.5000 mg | ORAL_SOLUTION | Freq: Four times a day (QID) | ORAL | Status: DC | PRN

## 2012-03-05 MED ORDER — BUPIVACAINE 0.25 % ON-Q PUMP SINGLE CATH 300ML
INJECTION | Status: DC | PRN
  Administered 2012-03-05: 300 mL

## 2012-03-05 MED ORDER — CEFAZOLIN SODIUM-DEXTROSE 2-3 GM-% IV SOLR
INTRAVENOUS | Status: AC
  Filled 2012-03-05: qty 50

## 2012-03-05 SURGICAL SUPPLY — 55 items
BAG SPEC THK2 15X12 ZIP CLS (MISCELLANEOUS) ×1
BAG ZIPLOCK 12X15 (MISCELLANEOUS) ×2 IMPLANT
BANDAGE ELASTIC 6 VELCRO ST LF (GAUZE/BANDAGES/DRESSINGS) ×2 IMPLANT
BANDAGE ESMARK 6X9 LF (GAUZE/BANDAGES/DRESSINGS) ×1 IMPLANT
BLADE SAG 18X100X1.27 (BLADE) ×2 IMPLANT
BLADE SAW SGTL 11.0X1.19X90.0M (BLADE) ×2 IMPLANT
BNDG CMPR 9X6 STRL LF SNTH (GAUZE/BANDAGES/DRESSINGS) ×1
BNDG ESMARK 6X9 LF (GAUZE/BANDAGES/DRESSINGS) ×2
BOWL SMART MIX CTS (DISPOSABLE) ×2 IMPLANT
CATH KIT ON-Q SILVERSOAK 5 (CATHETERS) ×1 IMPLANT
CATH KIT ON-Q SILVERSOAK 5IN (CATHETERS) ×2 IMPLANT
CEMENT HV SMART SET (Cement) ×3 IMPLANT
CLOTH BEACON ORANGE TIMEOUT ST (SAFETY) ×2 IMPLANT
CLSR STERI-STRIP ANTIMIC 1/2X4 (GAUZE/BANDAGES/DRESSINGS) ×1 IMPLANT
CUFF TOURN SGL QUICK 34 (TOURNIQUET CUFF) ×2
CUFF TRNQT CYL 34X4X40X1 (TOURNIQUET CUFF) ×1 IMPLANT
DRAPE EXTREMITY T 121X128X90 (DRAPE) ×2 IMPLANT
DRAPE POUCH INSTRU U-SHP 10X18 (DRAPES) ×2 IMPLANT
DRAPE U-SHAPE 47X51 STRL (DRAPES) ×2 IMPLANT
DRSG ADAPTIC 3X8 NADH LF (GAUZE/BANDAGES/DRESSINGS) ×2 IMPLANT
DRSG PAD ABDOMINAL 8X10 ST (GAUZE/BANDAGES/DRESSINGS) ×1 IMPLANT
DURAPREP 26ML APPLICATOR (WOUND CARE) ×2 IMPLANT
ELECT REM PT RETURN 9FT ADLT (ELECTROSURGICAL) ×2
ELECTRODE REM PT RTRN 9FT ADLT (ELECTROSURGICAL) ×1 IMPLANT
EVACUATOR 1/8 PVC DRAIN (DRAIN) ×2 IMPLANT
FACESHIELD LNG OPTICON STERILE (SAFETY) ×10 IMPLANT
GLOVE BIO SURGEON STRL SZ7.5 (GLOVE) ×2 IMPLANT
GLOVE BIO SURGEON STRL SZ8 (GLOVE) ×2 IMPLANT
GLOVE BIOGEL PI IND STRL 8 (GLOVE) ×2 IMPLANT
GLOVE BIOGEL PI INDICATOR 8 (GLOVE) ×2
GOWN STRL NON-REIN LRG LVL3 (GOWN DISPOSABLE) ×2 IMPLANT
GOWN STRL REIN XL XLG (GOWN DISPOSABLE) ×2 IMPLANT
HANDPIECE INTERPULSE COAX TIP (DISPOSABLE) ×2
IMMOBILIZER KNEE 20 (SOFTGOODS) ×2
IMMOBILIZER KNEE 20 THIGH 36 (SOFTGOODS) ×1 IMPLANT
KIT BASIN OR (CUSTOM PROCEDURE TRAY) ×2 IMPLANT
MANIFOLD NEPTUNE II (INSTRUMENTS) ×2 IMPLANT
NS IRRIG 1000ML POUR BTL (IV SOLUTION) ×2 IMPLANT
PACK TOTAL JOINT (CUSTOM PROCEDURE TRAY) ×2 IMPLANT
PAD ABD 7.5X8 STRL (GAUZE/BANDAGES/DRESSINGS) ×2 IMPLANT
PADDING CAST COTTON 6X4 STRL (CAST SUPPLIES) ×4 IMPLANT
POSITIONER SURGICAL ARM (MISCELLANEOUS) ×2 IMPLANT
SET HNDPC FAN SPRY TIP SCT (DISPOSABLE) ×1 IMPLANT
SPONGE GAUZE 4X4 12PLY (GAUZE/BANDAGES/DRESSINGS) ×2 IMPLANT
STRIP CLOSURE SKIN 1/2X4 (GAUZE/BANDAGES/DRESSINGS) ×4 IMPLANT
SUCTION FRAZIER 12FR DISP (SUCTIONS) ×2 IMPLANT
SUT MNCRL AB 4-0 PS2 18 (SUTURE) ×2 IMPLANT
SUT PDS AB 1 CT1 27 (SUTURE) ×6 IMPLANT
SUT VIC AB 2-0 CT1 27 (SUTURE) ×6
SUT VIC AB 2-0 CT1 TAPERPNT 27 (SUTURE) ×3 IMPLANT
SUT VLOC 180 0 24IN GS25 (SUTURE) ×2 IMPLANT
TOWEL OR 17X26 10 PK STRL BLUE (TOWEL DISPOSABLE) ×4 IMPLANT
TRAY FOLEY CATH 14FRSI W/METER (CATHETERS) ×2 IMPLANT
WATER STERILE IRR 1500ML POUR (IV SOLUTION) ×2 IMPLANT
WRAP KNEE MAXI GEL POST OP (GAUZE/BANDAGES/DRESSINGS) ×4 IMPLANT

## 2012-03-05 NOTE — Op Note (Signed)
Pre-operative diagnosis- Osteoarthritis  Left knee(s)  Post-operative diagnosis- Osteoarthritis Left knee(s)  Procedure-  Left  Total Knee Arthroplasty  Surgeon- Michael [name]. Michael Kimmey, MD  Assistant- Michael Peace, PA-C   Anesthesia-  Spinal EBL-* No blood loss amount entered *  Drains Hemovac  Tourniquet time-  Total Tourniquet Time Documented: Thigh (Left) - 36 minutes   Complications- None  Condition-PACU - hemodynamically stable.   Brief Clinical Note   Michael [name] is a 78 y.o. year old male with end stage OA of his left knee with progressively worsening pain and dysfunction. He has constant pain, with activity and at rest and significant functional deficits with difficulties even with ADLs. He has had extensive non-op management including analgesics, injections of cortisone and viscosupplements, and home exercise program, but remains in significant pain with significant dysfunction. Radiographs show bone on bone arthritis all 3 compartments with subchondral sclerosis. He presents now for left Total Knee Arthroplasty.    Procedure in detail---   The patient is brought into the operating room and positioned supine on the operating table. After successful administration of  Spinal,   a tourniquet is placed high on the  Left thigh(s) and the lower extremity is prepped and draped in the usual sterile fashion. Time out is performed by the operating team and then the  Left lower extremity is wrapped in Esmarch, knee flexed and the tourniquet inflated to 300 mmHg.       A midline incision is made with a ten blade through the subcutaneous tissue to the level of the extensor mechanism. A fresh blade is used to make a medial parapatellar arthrotomy. Soft tissue over the proximal medial tibia is subperiosteally elevated to the joint line with a knife and into the semimembranosus bursa with a Cobb elevator. Soft tissue over the proximal lateral tibia is elevated with attention being paid to avoiding  the patellar tendon on the tibial tubercle. The patella is everted, knee flexed 90 degrees and the ACL and PCL are removed. Findings are bone on bone all 3 compartments with a massive loose body and large osteophytes.        The drill is used to create a starting hole in the distal femur and the canal is thoroughly irrigated with sterile saline to remove the fatty contents. The 5 degree Left  valgus alignment guide is placed into the femoral canal and the distal femoral cutting block is pinned to remove 11 mm off the distal femur. Resection is made with an oscillating saw.      The tibia is subluxed forward and the menisci are removed. The extramedullary alignment guide is placed referencing proximally at the medial aspect of the tibial tubercle and distally along the second metatarsal axis and tibial crest. The block is pinned to remove 2mm off the more deficient medial  side. Resection is made with an oscillating saw. Size 5is the most appropriate size for the tibia and the proximal tibia is prepared with the modular drill and keel punch for that size.      The femoral sizing guide is placed and size 4 is most appropriate. Rotation is marked off the epicondylar axis and confirmed by creating a rectangular flexion gap at 90 degrees. The size 4 cutting block is pinned in this rotation and the anterior, posterior and chamfer cuts are made with the oscillating saw. The intercondylar block is then placed and that cut is made.      Trial size 5 tibial component, trial size  4 posterior stabilized femur and a 15  mm posterior stabilized rotating platform insert trial is placed. Full extension is achieved with excellent varus/valgus and anterior/posterior balance throughout full range of motion. The patella is everted and thickness measured to be 24  mm. Free hand resection is taken to 14 mm, a 38 template is placed, lug holes are drilled, trial patella is placed, and it tracks normally. Osteophytes are removed off the  posterior femur with the trial in place. All trials are removed and the cut bone surfaces prepared with pulsatile lavage. Cement is mixed and once ready for implantation, the size 5 tibial implant, size  4 posterior stabilized femoral component, and the size 38 patella are cemented in place and the patella is held with the clamp. The trial insert is placed and the knee held in full extension. All extruded cement is removed and once the cement is hard the permanent 15 mm posterior stabilized rotating platform insert is placed into the tibial tray.      The wound is copiously irrigated with saline solution and the extensor mechanism closed over a hemovac drain with #1 PDS suture. The tourniquet is released for a total tourniquet time of 36  minutes. Flexion against gravity is 140 degrees and the patella tracks normally. Subcutaneous tissue is closed with 2.0 vicryl and subcuticular with running 4.0 Monocryl. The catheter for the Marcaine pain pump is placed and the pump is initiated. The incision is cleaned and dried and steri-strips and a bulky sterile dressing are applied. The limb is placed into a knee immobilizer and the patient is awakened and transported to recovery in stable condition.      Please note that a surgical assistant was a medical necessity for this procedure in order to perform it in a safe and expeditious manner. Surgical assistant was necessary to retract the ligaments and vital neurovascular structures to prevent injury to them and also necessary for proper positioning of the limb to allow for anatomic placement of the prosthesis.   Michael [name] Michael Vickers, MD    03/05/2012, 12:30 PM

## 2012-03-05 NOTE — Anesthesia Preprocedure Evaluation (Signed)
Anesthesia Evaluation  Patient identified by MRN, date of birth, ID band Patient awake    Reviewed: Allergy & Precautions, H&P , NPO status , Patient's Chart, lab work & pertinent test results  Airway Mallampati: II TM Distance: >3 FB Neck ROM: Full    Dental  (+) Teeth Intact and Dental Advisory Given   Pulmonary neg pulmonary ROS,  breath sounds clear to auscultation  Pulmonary exam normal       Cardiovascular hypertension, Pt. on medications Rhythm:Regular Rate:Normal     Neuro/Psych PSYCHIATRIC DISORDERS  Neuromuscular disease    GI/Hepatic Neg liver ROS, GERD-  Medicated and Controlled,  Endo/Other  negative endocrine ROS  Renal/GU Renal InsufficiencyRenal disease  negative genitourinary   Musculoskeletal negative musculoskeletal ROS (+)   Abdominal   Peds negative pediatric ROS (+)  Hematology negative hematology ROS (+)   Anesthesia Other Findings   Reproductive/Obstetrics negative OB ROS                           Anesthesia Physical Anesthesia Plan  ASA: II  Anesthesia Plan: Spinal   Post-op Pain Management:    Induction: Intravenous  Airway Management Planned: Simple Face Mask  Additional Equipment:   Intra-op Plan:   Post-operative Plan: Extubation in OR  Informed Consent: I have reviewed the patients History and Physical, chart, labs and discussed the procedure including the risks, benefits and alternatives for the proposed anesthesia with the patient or authorized representative who has indicated his/her understanding and acceptance.   Dental advisory given  Plan Discussed with: CRNA  Anesthesia Plan Comments:         Anesthesia Quick Evaluation

## 2012-03-05 NOTE — Progress Notes (Signed)
0900 Left forearm open area obtained by son's dog 3 days ago looks clean no signs of infection noted has been cleaning with peroxide,scapled area right upper arm from strom door no signs of infection

## 2012-03-05 NOTE — Anesthesia Postprocedure Evaluation (Signed)
Anesthesia Post Note  Patient: Michael [name]  Procedure(s) Performed: Procedure(s) (LRB): TOTAL KNEE ARTHROPLASTY (Left)  Anesthesia type: Spinal  Patient location: PACU  Post pain: Pain level controlled  Post assessment: Post-op Vital signs reviewed  Last Vitals:  Filed Vitals:   03/05/12 1330  BP: 103/56  Pulse: 61  Temp: 36.6 C  Resp: 15    Post vital signs: Reviewed  Level of consciousness: sedated  Complications: No apparent anesthesia complications

## 2012-03-05 NOTE — Transfer of Care (Signed)
Immediate Anesthesia Transfer of Care Note  Patient: Michael [name]  Procedure(s) Performed: Procedure(s) (LRB): TOTAL KNEE ARTHROPLASTY (Left)  Patient Location: PACU  Anesthesia Type: Regional  Level of Consciousness: awake, alert , oriented and patient cooperative  Airway & Oxygen Therapy: Patient Spontanous Breathing and Patient connected to face mask oxygen  Post-op Assessment: Report given to PACU RN, Post -op Vital signs reviewed and stable and Patient moving all extremities  Post vital signs: Reviewed and stable  Complications: No apparent anesthesia complications

## 2012-03-05 NOTE — H&P (View-Only) (Signed)
Michael [name]  DOB: 01/20/1932 Married / Language: Undefined / Race: White Male  Date of Admission:  03/05/2012  Chief Complaint:  Right Knee Pain  History of Present Illness The patient is a 78 year old male who comes in for a preoperative History and Physical. The patient is scheduled for a left total knee arthroplasty to be performed by Dr. Gus Rankin. Aluisio, MD at [facility] on 03/05/2012. The patient is a 78 year old male who presents with knee complaints. The patient is seen today for a second opinion. The patient reports left knee symptoms including: pain, instability, weakness and stiffness which began 10 year(s) ago without any known injury.The patient feels that the symptoms are worsening. The patient has the current diagnosis of knee osteoarthritis. Prior to being seen today the patient was previously evaluated by a colleague 5 year(s) ago. Previous work-up for this problem has included knee x-rays and knee MRI. Past treatment for this problem has included intra-articular injection of corticosteroids (and aspiration). Current treatment includes nonsteroidal anti-inflammatory drugs (ibuprofen or Aleve prn). Note for "Knee pain": previous MRI has shown loose body; he has never had this removed. He states he has learned to manipulate it out of painful areas. His left knee is far more symptomatic than his right. He has had cortisone injections in the past without benefit. He may have had one series of Visco supplements also without benefit. At this point, the knee is hurting at all times preventing him from doing what he wants to do. He also will get significant mechanical type symptoms in the left. He said he has a loose piece that will tend to migrate and make his knee lock up. He said he can't predict when this will happen. When it has happened in the past, it has almost caused him to fall. He said he is much better now and manipulating out into a lateral position and  preventing it from occurring. He is at a stage now where he wants to get this knee fixed. They have been treated conservatively in the past for the above stated problem and despite conservative measures, they continue to have progressive pain and severe functional limitations and dysfunction. They have failed non-operative management. It is felt that they would benefit from undergoing total joint replacement. Risks and benefits of the procedure have been discussed with the patient and they elect to proceed with surgery. There are no active contraindications to surgery such as ongoing infection or rapidly progressive neurological disease.      Problem List/Past Medical Osteoarthritis, Knee (715.96) Hypercholesterolemia High blood pressure Colon Cancer Impaired Vision Impaired Hearing Varicose veins Hiatal Hernia Gastroesophageal Reflux Disease Kidney Stone Measles Mumps   Allergies No Known Drug Allergies   Family History Osteoarthritis. father Father. Deceased. age 51 Mother. Deceased, Congestive Heart Failure. age 31   Social History Marital status. married Living situation. live with spouse Illicit drug use. no Tobacco use. never smoker Pain Contract. no Number of flights of stairs before winded. 4-5 Current work status. retired Copywriter, advertising. 3 Alcohol use. never consumed alcohol Exercise. Exercises rarely; does running / walking Drug/Alcohol Rehab (Previously). no Drug/Alcohol Rehab (Currently). no Post-Surgical Plans. Plan is to go home.   Medication History Lovastatin (20MG  Tablet, Oral daily) Active. Diltiazem HCl CR (240MG /24HR Capsule ER 24HR, Oral daily) Active. Omeprazole (20MG  Capsule DR, Oral daily) Active. Aspirin EC (325MG  Tablet DR, Oral daily) Active. TraMADol HCl (50MG  Tablet, Oral) Active. Multivitamin ( Oral) Active. Fish Oil Burp-Less (500MG  Capsule,  Oral) Active.   Past Surgical History Vasectomy Colon Polyp Removal  - Colonoscopy Colectomy. Partial Umbilical Heria Repair Rotator Cuff Repair - Right     Review of Systems General:Not Present- Chills, Fever, Night Sweats, Fatigue, Weight Gain, Weight Loss and Memory Loss. Skin:Not Present- Hives, Itching, Rash, Eczema and Lesions. HEENT:Present- Tinnitus and Hearing Loss. Not Present- Headache, Double Vision, Visual Loss and Dentures. Respiratory:Not Present- Shortness of breath with exertion, Shortness of breath at rest, Allergies, Coughing up blood and Chronic Cough. Cardiovascular:Not Present- Chest Pain, Racing/skipping heartbeats, Difficulty Breathing Lying Down, Murmur, Swelling and Palpitations. Gastrointestinal:Not Present- Bloody Stool, Heartburn, Abdominal Pain, Vomiting, Nausea, Constipation, Diarrhea, Difficulty Swallowing, Jaundice and Loss of appetitie. Male Genitourinary:Not Present- Urinary frequency, Blood in Urine, Weak urinary stream, Discharge, Flank Pain, Incontinence, Painful Urination, Urgency, Urinary Retention and Urinating at Night. Musculoskeletal:Present- Joint Pain and Back Pain. Not Present- Muscle Weakness, Muscle Pain, Joint Swelling, Morning Stiffness and Spasms. Neurological:Not Present- Tremor, Dizziness, Blackout spells, Paralysis, Difficulty with balance and Weakness. Psychiatric:Not Present- Insomnia.   Vitals Weight: 214 lb Height: 70 in Body Surface Area: 2.19 m Body Mass Index: 30.71 kg/m Pulse: 72 (Regular) Resp.: 16 (Unlabored) BP: 106/72 (Sitting, Right Arm, Standard)    Physical Exam The physical exam findings are as follows:  Patient is a 78 year old male with continued bilateral knee pain.  Patient is accompanied today by his wife.   General Mental Status - Alert, cooperative and good historian. General Appearance- pleasant. Not in acute distress. Orientation- Oriented X3. Build & Nutrition- Well nourished and Well developed.   Head and Neck Head-  normocephalic, atraumatic . Neck Global Assessment- supple. no bruit auscultated on the right and no bruit auscultated on the left.   Eye Pupil- Bilateral- Regular and Round. Motion- Bilateral- EOMI.   Chest and Lung Exam Auscultation: Breath sounds:- clear at anterior chest wall and - clear at posterior chest wall. Adventitious sounds:- No Adventitious sounds.   Cardiovascular Auscultation:Rhythm- Regular rate and rhythm (did have an occasional pause or skipped beat on auscultation.). Heart Sounds- S1 WNL and S2 WNL. Murmurs & Other Heart Sounds:Auscultation of the heart reveals - No Murmurs.   Abdomen Palpation/Percussion:Tenderness- Abdomen is non-tender to palpation. Rigidity (guarding)- Abdomen is soft. Auscultation:Auscultation of the abdomen reveals - Bowel sounds normal.   Male Genitourinary Not done, not pertinent to present illness  Musculoskeletal He is alert and oriented in no apparent distress. Both hips have normal range of motion with no discomfort. The left knee shows no effusion. I can palpate that loose body. It is out in the lateral gutter. It is mobile. His range of motion is about 10 to 120 on that side. There is marked crepitus on range of motion. There is no instability noted. He is tender medial greater than lateral. Right knee with no effusion. Range is 5 to 125, marked crepitus on range of motion, tender medial greater than lateral with no instability. Pulse, sensation and motor are intact in both lower extremities.  RADIOGRAPHS: AP and lateral of both knees shows advanced endstage arthritis of both knees, bone on bone lateral, patellofemoral and medial on the left and then medial and patellofemoral on the right. He has large loose body on the left knee which is calcified. He has significant subchondral sclerosis of both tibias.  Assessment & Plan Osteoarthritis Left Knee  Note: Patient is for a Left Total Knee Repalcement  by Dr. Lequita Halt.  Plan is to go home after the surgery.  PCP - Dr. Karleen Hampshire  [place]  Signed electronically by Roberts Gaudy, PA-C

## 2012-03-05 NOTE — Progress Notes (Signed)
Utilization review completed.  

## 2012-03-05 NOTE — Progress Notes (Signed)
ANTICOAGULATION CONSULT NOTE - Initial Consult  Pharmacy Consult for Wararin Indication: VTE prophylaxis  Not on File  Patient Measurements:    Vital Signs: Temp: 97.6 F (36.4 C) (06/24 1356) BP: 123/74 mmHg (06/24 1356) Pulse Rate: 62  (06/24 1356)  Labs: No results found for this basename: HGB:2,HCT:3,PLT:3,APTT:3,LABPROT:3,INR:3,HEPARINUNFRC:3,CREATININE:3,CKTOTAL:3,CKMB:3,TROPONINI:3 in the last 72 hours  The CrCl is unknown because both a height and weight (above a minimum accepted value) are required for this calculation.   Medical History: Past Medical History  Diagnosis Date  . Colon cancer 1988  . Ventral hernia 1988  . HLD (hyperlipidemia)   . GERD (gastroesophageal reflux disease)   . Rotator cuff rupture     Right; s/p repair  . BPH (benign prostatic hypertrophy)   . Abnormality of gait   . Unspecified hearing loss   . Impotence of organic origin   . Renal insufficiency     stones  . Arthritis   . HTN (hypertension)     EKG with clearance and ov Dr Dallas Schimke  on chart and EPIC 02/23/12,  CBC with diff, BMET and Hepatic panel EPIC ffrom PCP    Medications:  Scheduled:    . acetaminophen  1,000 mg Intravenous Once  . acetaminophen  1,000 mg Intravenous Q6H  .  ceFAZolin (ANCEF) IV  1 g Intravenous Q6H  .  ceFAZolin (ANCEF) IV  2 g Intravenous 60 min Pre-Op  . dexamethasone  10 mg Intravenous Once  . diltiazem  240 mg Oral Daily  . docusate sodium  100 mg Oral BID  . morphine   Intravenous Q4H  . morphine      . pantoprazole  40 mg Oral Q1200  . simvastatin  10 mg Oral q1800   Infusions:    . sodium chloride    . bupivacaine ON-Q pain pump    . DISCONTD: sodium chloride    . DISCONTD: bupivacaine 0.25 % ON-Q pump SINGLE CATH 300 mL    . DISCONTD: lactated ringers    . DISCONTD: lactated ringers      Assessment:  33 YOM s/p left TKA 03/05/12  Pre-op labs showed SCr 1.8, CBC wnl, baseline INR 1.04  Goal of Therapy:  INR 2-3 Monitor  platelets by anticoagulation protocol: Yes   Plan:   Warfarin 7.5mg  PO tonight at 1800 x1  Daily INR  Warfarin education:  Book and video ordered for 6/25  Lynann Beaver PharmD, BCPS Pager [phone] 03/05/2012 2:20 PM

## 2012-03-05 NOTE — Interval H&P Note (Signed)
History and Physical Interval Note:  03/05/2012 11:15 AM  Michael [name]  has presented today for surgery, with the diagnosis of osteoarthritis left knee  The various methods of treatment have been discussed with the patient and family. After consideration of risks, benefits and other options for treatment, the patient has consented to  Procedure(s) (LRB): TOTAL KNEE ARTHROPLASTY (Left) as a surgical intervention .  The patient's history has been reviewed, patient examined, no change in status, stable for surgery.  I have reviewed the patients' chart and labs.  Questions were answered to the patient's satisfaction.     Loanne Drilling

## 2012-03-05 NOTE — Anesthesia Procedure Notes (Signed)
Spinal  Patient location during procedure: OR Start time: 03/05/2012 11:25 AM End time: 03/05/2012 11:30 AM Staffing Anesthesiologist: Lucille Passy F Performed by: anesthesiologist  Preanesthetic Checklist Completed: patient identified, site marked, surgical consent, pre-op evaluation, timeout performed, IV checked, risks and benefits discussed and monitors and equipment checked Spinal Block Patient position: sitting Prep: Betadine Patient monitoring: heart rate, continuous pulse ox and blood pressure Approach: midline Location: L3-4 Injection technique: single-shot Needle Needle type: Quincke  Needle gauge: 22 G Needle length: 9 cm Additional Notes Expiration date of kit checked and confirmed. Patient tolerated procedure well, without complications. Lot [id number] DOE 05/2013 Negative heme/paresthesia

## 2012-03-06 ENCOUNTER — Encounter (HOSPITAL_COMMUNITY): Payer: Self-pay | Admitting: Orthopedic Surgery

## 2012-03-06 LAB — BASIC METABOLIC PANEL
CO2: 26 mEq/L (ref 19–32)
Calcium: 8.3 mg/dL — ABNORMAL LOW (ref 8.4–10.5)
Chloride: 103 mEq/L (ref 96–112)
Creatinine, Ser: 1.49 mg/dL — ABNORMAL HIGH (ref 0.50–1.35)
Glucose, Bld: 152 mg/dL — ABNORMAL HIGH (ref 70–99)
Sodium: 138 mEq/L (ref 135–145)

## 2012-03-06 LAB — CBC
Hemoglobin: 11.3 g/dL — ABNORMAL LOW (ref 13.0–17.0)
MCH: 31.6 pg (ref 26.0–34.0)
MCV: 93.6 fL (ref 78.0–100.0)
RBC: 3.58 MIL/uL — ABNORMAL LOW (ref 4.22–5.81)
WBC: 13.4 10*3/uL — ABNORMAL HIGH (ref 4.0–10.5)

## 2012-03-06 LAB — PROTIME-INR: INR: 1.12 (ref 0.00–1.49)

## 2012-03-06 MED ORDER — MORPHINE SULFATE 2 MG/ML IJ SOLN: 1.0000 mg | INTRAMUSCULAR | Status: DC | PRN

## 2012-03-06 MED ORDER — ALUM & MAG HYDROXIDE-SIMETH 200-200-20 MG/5ML PO SUSP
ORAL | Status: AC
  Filled 2012-03-06: qty 30

## 2012-03-06 MED ORDER — WARFARIN SODIUM 7.5 MG PO TABS
7.5000 mg | ORAL_TABLET | Freq: Once | ORAL | Status: AC
  Administered 2012-03-06: 7.5 mg via ORAL
  Filled 2012-03-06: qty 1

## 2012-03-06 MED ORDER — OMEPRAZOLE 20 MG PO CPDR
20.0000 mg | DELAYED_RELEASE_CAPSULE | Freq: Every day | ORAL | Status: DC
  Administered 2012-03-06 – 2012-03-08 (×3): 20 mg via ORAL
  Filled 2012-03-06 (×3): qty 1

## 2012-03-06 MED ORDER — NON FORMULARY: 20.0000 mg | Freq: Every day | Status: DC

## 2012-03-06 MED ORDER — METOCLOPRAMIDE HCL 5 MG/ML IJ SOLN
5.0000 mg | Freq: Once | INTRAMUSCULAR | Status: AC
  Administered 2012-03-06: 5 mg via INTRAVENOUS

## 2012-03-06 MED ORDER — ALUM & MAG HYDROXIDE-SIMETH 200-200-20 MG/5ML PO SUSP
30.0000 mL | ORAL | Status: DC | PRN
  Administered 2012-03-06 – 2012-03-07 (×2): 30 mL via ORAL
  Filled 2012-03-06: qty 30

## 2012-03-06 NOTE — Progress Notes (Signed)
Subjective: 1 Day Post-Op Procedure(s) (LRB): TOTAL KNEE ARTHROPLASTY (Left) Patient reports pain as mild.   Patient seen in rounds with Dr. Lequita Halt. Patient is well, but has had some minor complaints of not much sleep. We will start therapy today.  Plan is to go Home after hospital stay.  Objective: Vital signs in last 24 hours: Temp:  [97.6 F (36.4 C)-98.4 F (36.9 C)] 98 F (36.7 C) (06/25 0500) Pulse Rate:  [52-87] 76  (06/25 0500) Resp:  [12-20] 20  (06/25 0500) BP: (94-157)/(52-83) 113/71 mmHg (06/25 0500) SpO2:  [87 %-100 %] 95 % (06/25 0500) Weight:  [96.163 kg (212 lb)] 96.163 kg (212 lb) (06/24 1356)  Intake/Output from previous day:  Intake/Output Summary (Last 24 hours) at 03/06/12 0827 Last data filed at 03/06/12 0553  Gross per 24 hour  Intake 5373.33 ml  Output   3110 ml  Net 2263.33 ml    Intake/Output this shift: UOP 250  Labs:  [facility] 03/06/12 0426  HGB 11.3*    Basename 03/06/12 0426  WBC 13.4*  RBC 3.58*  HCT 33.5*  PLT 213    Basename 03/06/12 0426  NA 138  K 4.0  CL 103  CO2 26  BUN 24*  CREATININE 1.49*  GLUCOSE 152*  CALCIUM 8.3*    Basename 03/06/12 0426  LABPT --  INR 1.12    EXAM General - Patient is Alert, Appropriate and Oriented Extremity - Neurovascular intact Sensation intact distally Dorsiflexion/Plantar flexion intact Dressing - dressing C/D/I Motor Function - intact, moving foot and toes well on exam.  Hemovac pulled without difficulty.  Past Medical History  Diagnosis Date  . Colon cancer 1988  . Ventral hernia 1988  . HLD (hyperlipidemia)   . GERD (gastroesophageal reflux disease) Omeprazole 20 mg  . Rotator cuff rupture     Right; s/p repair  . BPH (benign prostatic hypertrophy) Has foley, keep until tomorrow  . Abnormality of gait   . Unspecified hearing loss   . Impotence of organic origin   . Renal insufficiency BUN 24, Creat 1.49 Preop 28/1.8    stones  . Arthritis   . HTN  (hypertension) Cardizem (parameter)    EKG with clearance and ov Dr Dallas Schimke  on chart and EPIC 02/23/12,  CBC with diff, BMET and Hepatic panel EPIC ffrom PCP    Assessment/Plan: 1 Day Post-Op Procedure(s) (LRB): TOTAL KNEE ARTHROPLASTY (Left) Principal Problem:  *OA (osteoarthritis) of knee   Advance diet Up with therapy Continue foley due to strict I&O and urinary output monitoring Discharge home with home health  DVT Prophylaxis - Coumadin Weight-Bearing as tolerated to left leg Keep foley until tomorrow. No vaccines. D/C PCA Morphine, Change to IV push D/C O2 and Pulse OX and try on Room [address]  Patrica Duel 03/06/2012, 8:27 AM

## 2012-03-06 NOTE — Progress Notes (Signed)
ANTICOAGULATION CONSULT NOTE - Follow Up Consult  Pharmacy Consult for Wararin Indication: VTE prophylaxis  Not on File  Patient Measurements: Height: 5\' 11"  (180.3 cm) Weight: 212 lb (96.163 kg) IBW/kg (Calculated) : 75.3   Vital Signs: Temp: 98 F (36.7 C) (06/25 0500) Temp src: Oral (06/25 0500) BP: 113/71 mmHg (06/25 0500) Pulse Rate: 76  (06/25 0500)  Labs:  Basename 03/06/12 0426  HGB 11.3*  HCT 33.5*  PLT 213  APTT --  LABPROT 14.6  INR 1.12  HEPARINUNFRC --  CREATININE 1.49*  CKTOTAL --  CKMB --  TROPONINI --   Estimated Creatinine Clearance: 49.2 ml/min (by C-G formula based on Cr of 1.49).  Assessment:  28 YOM s/p left TKA 03/05/12 on Warfarin for VTE prophylaxis.   Coumadin score = 5   INR unchanged, 1.12, as expected with Coumadin initiation  Goal of Therapy:  INR 2-3 Monitor platelets by anticoagulation protocol: Yes   Plan:   Repeat Warfarin 7.5mg  PO tonight at 1800 x1  Daily PT/INR  Will provide Coumadin education when able.   Geoffry Paradise, PharmD.   Pager:  [phone] 8:32 AM

## 2012-03-06 NOTE — Evaluation (Signed)
Physical Therapy Evaluation Patient Details Name: Michael [name] MRN: [id number] DOB: 06-20-1932 Today's Date: 03/06/2012 Time: 1191-4782 PT Time Calculation (min): 39 min  PT Assessment / Plan / Recommendation Clinical Impression  Pt with L TKR presents with decreased L LE strength/ROM and limitations in functional mobiltiy    PT Assessment  Patient needs continued PT services    Follow Up Recommendations  Home health PT    Barriers to Discharge        lEquipment Recommendations  Rolling walker with 5" wheels    Recommendations for Other Services OT consult   Frequency 7X/week    Precautions / Restrictions Precautions Precautions: Knee Required Braces or Orthoses: Knee Immobilizer - Left Knee Immobilizer - Left: Discontinue once straight leg raise with < 10 degree lag (Pt did IND SLR this am) Restrictions Weight Bearing Restrictions: No   Pertinent Vitals/Pain       Mobility  Bed Mobility Bed Mobility: Supine to Sit Supine to Sit: 4: Min assist Details for Bed Mobility Assistance: cues for sequence and min assist for L LE Transfers Transfers: Sit to Stand;Stand to Sit Sit to Stand: 4: Min assist Stand to Sit: 4: Min assist Details for Transfer Assistance: cues for use of UEs and for LE management Ambulation/Gait Ambulation/Gait Assistance: 4: Min Environmental consultant (Feet): 56 Feet Assistive device: Rolling walker Ambulation/Gait Assistance Details: cues for sequence, posture and position from RW Gait Pattern: Step-to pattern    Exercises Total Joint Exercises Ankle Circles/Pumps: AROM;Supine;10 reps;Both Quad Sets: AROM;10 reps;Supine;Both Heel Slides: 10 reps;AAROM;Supine;Left Straight Leg Raises: AAROM;AROM;Supine;15 reps;Left   PT Diagnosis: Difficulty walking  PT Problem List: Decreased strength;Decreased range of motion;Decreased activity tolerance;Decreased mobility;Pain;Decreased knowledge of use of DME;Decreased knowledge of precautions PT  Treatment Interventions: DME instruction;Gait training;Stair training;Functional mobility training;Therapeutic activities;Therapeutic exercise;Patient/family education   PT Goals Acute Rehab PT Goals PT Goal Formulation: With patient Time For Goal Achievement: 03/12/12 Potential to Achieve Goals: Good Pt will go Supine/Side to Sit: with supervision PT Goal: Supine/Side to Sit - Progress: Goal set today Pt will go Sit to Supine/Side: with supervision PT Goal: Sit to Supine/Side - Progress: Goal set today Pt will go Sit to Stand: with supervision PT Goal: Sit to Stand - Progress: Goal set today Pt will go Stand to Sit: with supervision PT Goal: Stand to Sit - Progress: Goal set today Pt will Ambulate: 51 - 150 feet;with supervision;with rolling walker PT Goal: Ambulate - Progress: Goal set today Pt will Go Up / Down Stairs: 3-5 stairs;with min assist;with least restrictive assistive device PT Goal: Up/Down Stairs - Progress: Goal set today  Visit Information  Last PT Received On: 03/06/12 Assistance Needed: +1    Subjective Data  Subjective: I might need the other knee done too Patient Stated Goal: Walk again without pain   Prior Functioning  Home Living Lives With: Spouse Available Help at Discharge: Family Type of Home: House Home Access: Stairs to enter Secretary/administrator of Steps: 3 Entrance Stairs-Rails: None Home Layout: One level Prior Function Level of Independence: Independent Able to Take Stairs?: Yes Driving: Yes Communication Communication: HOH    Cognition  Overall Cognitive Status: Appears within functional limits for tasks assessed/performed Arousal/Alertness: Awake/alert Orientation Level: Appears intact for tasks assessed Behavior During Session: [facility] for tasks performed    Extremity/Trunk Assessment Right Upper Extremity Assessment RUE ROM/Strength/Tone: Within functional levels Left Upper Extremity Assessment LUE ROM/Strength/Tone: WFL for  tasks assessed Right Lower Extremity Assessment RLE ROM/Strength/Tone: [facility] for tasks assessed Left  Lower Extremity Assessment LLE ROM/Strength/Tone: Deficits LLE ROM/Strength/Tone Deficits: AAROM at knee -5 - 70; 3/5 quad strength   Balance    End of Session PT - End of Session Activity Tolerance: Patient tolerated treatment well Patient left: in chair;with call bell/phone within reach Nurse Communication: Mobility status   Irina Okelly 03/06/2012, 1:27 PM

## 2012-03-06 NOTE — Care Management Note (Signed)
    Page 1 of 2   03/08/2012     5:47:18 PM   CARE MANAGEMENT NOTE 03/08/2012  Patient:  Michael [name], Michael [name]   Account Number:  [id number]  Date Initiated:  03/06/2012  Documentation initiated by:  Colleen Can  Subjective/Objective Assessment:   DX OSTEOARTHRITIS KNEE-LEFT; TOTAL HIP REPLACEMENT     Action/Plan:   CM SPOKE WITH PT AND SPOUSE. PLANS ARE FOR PT TO RETURN TO HIS HOME IN [facility] WHERE HIS SPOUSE WILL BE CAREGIVER   Anticipated DC Date:  03/07/2012   Anticipated DC Plan:  HOME W HOME HEALTH SERVICES  In-house referral  Clinical Social Worker      DC Associate Professor  CM consult      PAC Choice  DURABLE MEDICAL EQUIPMENT  HOME HEALTH   Choice offered to / List presented to:  C-1 Patient   DME arranged  WALKER - ROLLING  3-N-1      DME agency  [facility]     HH arranged  HH-2 PT  HH-1 RN      [facility] agency  [facility]   Status of service:  Completed, signed off Medicare Important Message given?  NA - LOS <3 / Initial given by admissions (If response is "NO", the following Medicare IM given date fields will be blank) Date Medicare IM given:   Date Additional Medicare IM given:    Discharge Disposition:  HOME W HOME HEALTH SERVICES  Per UR Regulation:    If discussed at Long Length of Stay Meetings, dates discussed:    Comments:  03/08/2012 Raynelle Bring bsn ccm [phone] Pt discharged to home. [facility] notified. HH services to start tomorrow 03/09/2012  03/07/2012 Raynelle Bring BSN CCM [phone] Faxed HH orders-hhrn,pt, face sheet, op report, h&p to [facility] intake with confirmation.   03/06/2012 Raynelle Bring BSN CCM [phone] [facility] BY PATIENT FOR HH SERVICES. PT STATES HE WILL NEED RW. [facility] NOTIFIED OF DME NEED. CM WILL FOLLOW

## 2012-03-06 NOTE — Progress Notes (Signed)
Physical Therapy Treatment Patient Details Name: Michael [name] MRN: [id number] DOB: 04-23-1932 Today's Date: 03/06/2012 Time: 4098-1191 PT Time Calculation (min): 25 min  PT Assessment / Plan / Recommendation Comments on Treatment Session  Applied KI and instructed pt on proper use for amb and when to D/C.  Assisted pt out of recliner to amb 2nd time then assisted back to bed for CPM.      Follow Up Recommendations  Home health PT                      Frequency   x7/wk  Plan Discharge plan remains appropriate    Precautions / Restrictions   KI for amb until able to perform 10 active SLR  Pertinent Vitals/Pain C/o "some" knee pain with act Applied ICE    Mobility  Bed Mobility Bed Mobility: Sit to Supine Sit to Supine: 4: Min assist Details for Bed Mobility Assistance: min assist to support L LE up on bed Transfers Transfers: Sit to Stand;Stand to Sit Sit to Stand: 4: Min assist;From chair/3-in-1 Stand to Sit: 4: Min assist;To bed Details for Transfer Assistance: 25% VC's on proper tech and hand placement Ambulation/Gait Ambulation/Gait Assistance: 4: Min assist Ambulation Distance (Feet): 55 Feet Assistive device: Rolling walker Ambulation/Gait Assistance Details: 25% VC's on sequencing and upright posture Gait Pattern: Step-to pattern;Decreased stance time - left Gait velocity: decreased      PT Goals      progressing    Visit Information  Last PT Received On: 03/06/12 Assistance Needed: +1                   End of Session PT - End of Session Activity Tolerance: Patient tolerated treatment well Patient left: in bed;with call bell/phone within reach;with family/visitor present    Felecia Shelling  PTA [facility]  Acute  Rehab Pager     [phone]

## 2012-03-07 LAB — BASIC METABOLIC PANEL
BUN: 46 mg/dL — ABNORMAL HIGH (ref 6–23)
CO2: 24 mEq/L (ref 19–32)
GFR calc non Af Amer: 38 mL/min — ABNORMAL LOW (ref >90)
Glucose, Bld: 110 mg/dL — ABNORMAL HIGH (ref 70–99)
Potassium: 3.7 mEq/L (ref 3.5–5.1)
Sodium: 134 mEq/L — ABNORMAL LOW (ref 135–145)

## 2012-03-07 LAB — CBC
HCT: 27.5 % — ABNORMAL LOW (ref 39.0–52.0)
Hemoglobin: 9.3 g/dL — ABNORMAL LOW (ref 13.0–17.0)
MCHC: 33.6 g/dL (ref 30.0–36.0)
RBC: 2.97 MIL/uL — ABNORMAL LOW (ref 4.22–5.81)

## 2012-03-07 LAB — PROTIME-INR: INR: 1.5 — ABNORMAL HIGH (ref 0.00–1.49)

## 2012-03-07 MED ORDER — SODIUM CHLORIDE 0.9 % IV BOLUS (SEPSIS)
500.0000 mL | Freq: Once | INTRAVENOUS | Status: AC
  Administered 2012-03-07: 500 mL via INTRAVENOUS

## 2012-03-07 MED ORDER — WARFARIN SODIUM 5 MG PO TABS
5.0000 mg | ORAL_TABLET | Freq: Once | ORAL | Status: AC
  Administered 2012-03-07: 5 mg via ORAL
  Filled 2012-03-07: qty 1

## 2012-03-07 MED ORDER — SODIUM CHLORIDE 0.9 % IV BOLUS (SEPSIS)
250.0000 mL | Freq: Once | INTRAVENOUS | Status: AC
  Administered 2012-03-07: 250 mL via INTRAVENOUS

## 2012-03-07 NOTE — Progress Notes (Signed)
Physical Therapy Treatment Patient Details Name: Michael [name] MRN: [id number] DOB: 05/26/1932 Today's Date: 03/07/2012 Time: 1010-1039 PT Time Calculation (min): 29 min  PT Assessment / Plan / Recommendation Comments on Treatment Session  POD #2 for L TKA. Pt progressing well with ambulation and transfers.  Pt ascended/descended 3 stairs with RW with min A. 8/10 pain L knee with activity.  Expect pt will be ready to DC home tomorrow. Pt requiring min A for SLR today.     Follow Up Recommendations  Home health PT    Barriers to Discharge        Equipment Recommendations  Rolling walker with 5" wheels    Recommendations for Other Services OT consult  Frequency 7X/week   Plan Discharge plan remains appropriate;Frequency remains appropriate    Precautions / Restrictions Precautions Precautions: Knee Required Braces or Orthoses: Knee Immobilizer - Left Knee Immobilizer - Left: Discontinue once straight leg raise with < 10 degree lag (Pt did IND SLR this am) Restrictions Weight Bearing Restrictions: No   Pertinent Vitals/Pain *8/10 L knee with walking, ice applied**    Mobility  Bed Mobility Bed Mobility: Sit to Supine Supine to Sit: 4: Min assist Details for Bed Mobility Assistance: min assist to support L LE  Transfers Transfers: Sit to Stand;Stand to Sit Sit to Stand: From chair/3-in-1;4: Min guard Stand to Sit: To bed;4: Min guard Details for Transfer Assistance: VCs for hand placement Ambulation/Gait Ambulation/Gait Assistance: 4: Min guard Ambulation Distance (Feet): 100 Feet Assistive device: Rolling walker Gait Pattern: Step-to pattern Gait velocity: decreased Stairs: Yes Stairs Assistance: 4: Min assist Stairs Assistance Details (indicate cue type and reason): VCs sequencing Stair Management Technique: Backwards;With walker Number of Stairs: 3     Exercises Total Joint Exercises Ankle Circles/Pumps: AROM;Supine;10 reps;Both Quad Sets: AROM;10  reps;Supine;Both Short Arc Quad: Left;AAROM;10 reps;Supine Heel Slides: 10 reps;AAROM;Supine;Left Straight Leg Raises: AAROM;AROM;Supine;15 reps;Left   PT Diagnosis:    PT Problem List:   PT Treatment Interventions:     PT Goals Acute Rehab PT Goals PT Goal Formulation: With patient Time For Goal Achievement: 03/12/12 Potential to Achieve Goals: Good Pt will go Supine/Side to Sit: with supervision PT Goal: Supine/Side to Sit - Progress: Progressing toward goal Pt will go Sit to Supine/Side: with supervision Pt will go Sit to Stand: with supervision PT Goal: Sit to Stand - Progress: Progressing toward goal Pt will go Stand to Sit: with supervision PT Goal: Stand to Sit - Progress: Progressing toward goal Pt will Ambulate: 51 - 150 feet;with supervision;with rolling walker PT Goal: Ambulate - Progress: Progressing toward goal Pt will Go Up / Down Stairs: 3-5 stairs;with min assist;with least restrictive assistive device PT Goal: Up/Down Stairs - Progress: Met  Visit Information  Last PT Received On: 03/07/12 Assistance Needed: +1    Subjective Data  Subjective: My knee is more sore today.  It just hurts when I move it though.  Patient Stated Goal: get the knee bending, and get it all the way straight   Cognition  Overall Cognitive Status: Appears within functional limits for tasks assessed/performed Arousal/Alertness: Awake/alert Orientation Level: Appears intact for tasks assessed Behavior During Session: [facility] for tasks performed    Balance     End of Session PT - End of Session Equipment Utilized During Treatment: Gait belt;Left knee immobilizer Activity Tolerance: Patient limited by pain Patient left: in chair;with call bell/phone within reach Nurse Communication: Mobility status CPM Left Knee CPM Left Knee: Off   GP  Ralene Bathe Kistler 03/07/2012, 10:47 AM [phone]

## 2012-03-07 NOTE — Progress Notes (Signed)
Subjective: 2 Days Post-Op Procedure(s) (LRB): TOTAL KNEE ARTHROPLASTY (Left) Patient reports pain as mild.   Patient is well, and has had no acute complaints or problems Plan is to go Home after hospital stay.  Objective: Vital signs in last 24 hours: Temp:  [98 F (36.7 C)-98.8 F (37.1 C)] 98 F (36.7 C) (06/26 1451) Pulse Rate:  [79-92] 79  (06/26 1451) Resp:  [16-18] 16  (06/26 2000) BP: (118-123)/(64-66) 118/64 mmHg (06/26 1451) SpO2:  [91 %-95 %] 91 % (06/26 1451)  Intake/Output from previous day:  Intake/Output Summary (Last 24 hours) at 03/07/12 2221 Last data filed at 03/07/12 2111  Gross per 24 hour  Intake 1082.67 ml  Output   4075 ml  Net -2992.33 ml    Intake/Output this shift: Total I/O In: 223.7 [I.V.:223.7] Out: 600 [Urine:600]  Labs:  [facility] 03/07/12 0420 03/06/12 0426  HGB 9.3* 11.3*    Basename 03/07/12 0420 03/06/12 0426  WBC 10.6* 13.4*  RBC 2.97* 3.58*  HCT 27.5* 33.5*  PLT 174 213    Basename 03/07/12 0420 03/06/12 0426  NA 134* 138  K 3.7 4.0  CL 102 103  CO2 24 26  BUN 46* 24*  CREATININE 1.66* 1.49*  GLUCOSE 110* 152*  CALCIUM 8.1* 8.3*    Basename 03/07/12 0420 03/06/12 0426  LABPT -- --  INR 1.50* 1.12    EXAM General - Patient is Alert, Appropriate and Oriented Extremity - Neurovascular intact Sensation intact distally Dorsiflexion/Plantar flexion intact Dressing/Incision - clean, dry, no drainage, healing Motor Function - intact, moving foot and toes well on exam.   Past Medical History  Diagnosis Date  . Colon cancer 1988  . Ventral hernia 1988  . HLD (hyperlipidemia)   . GERD (gastroesophageal reflux disease)   . Rotator cuff rupture     Right; s/p repair  . BPH (benign prostatic hypertrophy)   . Abnormality of gait   . Unspecified hearing loss   . Impotence of organic origin   . Renal insufficiency     stones  . Arthritis   . HTN (hypertension)     EKG with clearance and ov Dr Dallas Schimke  on chart  and EPIC 02/23/12,  CBC with diff, BMET and Hepatic panel EPIC ffrom PCP    Assessment/Plan: 2 Days Post-Op Procedure(s) (LRB): TOTAL KNEE ARTHROPLASTY (Left) Principal Problem:  *OA (osteoarthritis) of knee   Up with therapy Plan for discharge tomorrow Discharge home with home health  DVT Prophylaxis - Coumadin Weight-Bearing as tolerated to left leg  Michael [name] 03/07/2012, 10:21 PM

## 2012-03-07 NOTE — Evaluation (Signed)
Occupational Therapy Evaluation Patient Details Name: Michael [name] MRN: [id number] DOB: 12-27-1931 Today's Date: 03/07/2012 Time: 7829-5621 OT Time Calculation (min): 29 min  OT Assessment / Plan / Recommendation Clinical Impression  Pt is s/p L TKA and displays decreased overall strength, functional mobility and ADL independence. Will benefit from skilled OT services to improve ADL independence for discharge home with family.    OT Assessment  Patient needs continued OT Services    Follow Up Recommendations  No OT follow up    Barriers to Discharge      Equipment Recommendations  Rolling walker with 5" wheels;3 in 1 bedside comode    Recommendations for Other Services    Frequency  Min 2X/week    Precautions Precautions Precautions: Knee Required Braces or Orthoses: Knee Immobilizer - Left Knee Immobilizer - Left: Discontinue once straight leg raise with < 10 degree lag Restrictions Weight Bearing Restrictions: No        ADL  Eating/Feeding: Simulated;Independent Where Assessed - Eating/Feeding: Chair Grooming: Simulated;Set up Where Assessed - Grooming: Supported sitting Upper Body Bathing: Chest;Right arm;Left arm;Abdomen;Supervision/safety;Set up Where Assessed - Upper Body Bathing: Unsupported sitting Lower Body Bathing: Simulated;Minimal assistance Where Assessed - Lower Body Bathing: Supported sit to stand Upper Body Dressing: Simulated;Set up;Supervision/safety Where Assessed - Upper Body Dressing: Unsupported sitting Lower Body Dressing: Simulated;Minimal assistance Where Assessed - Lower Body Dressing: Supported sit to stand Toilet Transfer: Performed;Minimal assistance Toilet Transfer Method: Other (comment) (ambulating) Toilet Transfer Equipment: Raised toilet seat with arms (or 3-in-1 over toilet) Toileting - Clothing Manipulation and Hygiene: Simulated;Minimal assistance Where Assessed - Engineer, mining and Hygiene:  Standing Tub/Shower Transfer Method: Not assessed Equipment Used: Rolling walker ADL Comments: Discussed uses of 3in1 including as a shower seat. Wife can assist PRN.    OT Diagnosis: Generalized weakness  OT Problem List: Decreased strength;Decreased knowledge of use of DME or AE;Pain OT Treatment Interventions: Therapeutic activities;Self-care/ADL training;DME and/or AE instruction;Patient/family education   OT Goals Acute Rehab OT Goals OT Goal Formulation: With patient Time For Goal Achievement: 03/14/12 Potential to Achieve Goals: Good ADL Goals Pt Will Perform Grooming: with min assist;Other (comment);Standing at sink (min guard assist) ADL Goal: Grooming - Progress: Goal set today Pt Will Perform Lower Body Bathing: with min assist;Other (comment);Sit to stand from chair;Sit to stand from bed (min guard) ADL Goal: Lower Body Bathing - Progress: Goal set today Pt Will Perform Lower Body Dressing: with min assist;Sit to stand from chair;Sit to stand from bed;Other (comment) (min guard) ADL Goal: Lower Body Dressing - Progress: Goal set today Pt Will Transfer to Toilet: with min assist;Other (comment);with DME;3-in-1;Ambulation (min guard) ADL Goal: Toilet Transfer - Progress: Goal set today Pt Will Perform Toileting - Clothing Manipulation: with min assist;Other (comment);Standing (min guard) ADL Goal: Toileting - Clothing Manipulation - Progress: Goal set today Pt Will Perform Tub/Shower Transfer: with min assist;with DME ADL Goal: Tub/Shower Transfer - Progress: Goal set today  Visit Information  Last OT Received On: 03/07/12 Assistance Needed: +1    Subjective Data  Subjective: I will probably need one of those commodes Patient Stated Goal: home when able   Prior Functioning  Home Living Lives With: Spouse Available Help at Discharge: Family Type of Home: House Home Access: Stairs to enter Secretary/administrator of Steps: 3 Entrance Stairs-Rails: None Home Layout:  One level Bathroom Shower/Tub: Health visitor: Handicapped height (17 inches) Home Adaptive Equipment: None Prior Function Level of Independence: Independent Able to Take Stairs?: Yes Driving: Yes  Communication Communication: HOH    Cognition  Overall Cognitive Status: Appears within functional limits for tasks assessed/performed Arousal/Alertness: Awake/alert Orientation Level: Appears intact for tasks assessed Behavior During Session: [facility] for tasks performed    Extremity/Trunk Assessment Right Upper Extremity Assessment RUE ROM/Strength/Tone: [facility] for tasks assessed Left Upper Extremity Assessment LUE ROM/Strength/Tone: WFL for tasks assessed   Mobility Bed Mobility Bed Mobility: Sit to Supine Supine to Sit: 4: Min assist Details for Bed Mobility Assistance: min assist to support L LE  Transfers Transfers: Sit to Stand;Stand to Sit Sit to Stand: 4: Min assist;With upper extremity assist;From chair/3-in-1 Stand to Sit: 4: Min assist;With upper extremity assist;To chair/3-in-1 Details for Transfer Assistance: min verbal cues for hand placement and L LE out in front      Balance Balance Balance Assessed: Yes Dynamic Standing Balance Dynamic Standing - Level of Assistance: 4: Min assist;Other (comment) (to pull up gown)  End of Session OT - End of Session Equipment Utilized During Treatment: Gait belt Activity Tolerance: Patient tolerated treatment well Patient left: in chair;with call bell/phone within reach CPM Left Knee CPM Left Knee: [address]       Judithann Sauger [place] [phone] 03/07/2012, 11:48 AM

## 2012-03-07 NOTE — Progress Notes (Signed)
Physical Therapy Treatment Patient Details Name: Michael [name] MRN: [id number] DOB: 07-Apr-1932 Today's Date: 03/07/2012 Time: 7829-5621 PT Time Calculation (min): 29 min  PT Assessment / Plan / Recommendation Comments on Treatment Session  POD #2 for L TKA. Pt progressing well with ambulation and transfers.  . 3/10 pain L knee with activity.  Expect pt will be ready to DC home tomorrow.     Follow Up Recommendations  Home health PT    Barriers to Discharge        Equipment Recommendations  Rolling walker with 5" wheels    Recommendations for Other Services OT consult  Frequency 7X/week   Plan Discharge plan remains appropriate;Frequency remains appropriate    Precautions / Restrictions Precautions Precautions: Knee Required Braces or Orthoses: Knee Immobilizer - Left Knee Immobilizer - Left: Discontinue once straight leg raise with < 10 degree lag Restrictions Weight Bearing Restrictions: No   Pertinent Vitals/Pain **3/10 pain L knee, ice applied*    Mobility  Bed Mobility Bed Mobility: Sit to Supine Supine to Sit: 4: Min assist Sit to Supine: 4: Min assist;HOB flat;With rail Details for Bed Mobility Assistance: min A to support LLE Transfers Transfers: Sit to Stand;Stand to Sit Sit to Stand: With upper extremity assist;From chair/3-in-1;4: Min guard Stand to Sit: With upper extremity assist;To chair/3-in-1;4: Min guard Details for Transfer Assistance: min verbal cues for hand placement and L LE out in front Ambulation/Gait Ambulation/Gait Assistance: 5: Supervision Ambulation Distance (Feet): 165 Feet Assistive device: Rolling walker Gait Pattern: Step-to pattern Gait velocity: decreased Stairs: Yes Stairs Assistance: 4: Min assist Stairs Assistance Details (indicate cue type and reason): VCs sequencing Stair Management Technique: Backwards;With walker Number of Stairs: 3     Exercises Total Joint Exercises Ankle Circles/Pumps: AROM;Supine;10 reps;Both Quad  Sets: AROM;10 reps;Supine;Both Short Arc Quad: Left;AAROM;10 reps;Supine Heel Slides: AAROM;Supine;Left;15 reps Straight Leg Raises: AAROM;AROM;Supine;15 reps;Left Knee Flexion: AAROM;Left;10 reps;Seated   PT Diagnosis:    PT Problem List:   PT Treatment Interventions:     PT Goals Acute Rehab PT Goals PT Goal Formulation: With patient Time For Goal Achievement: 03/12/12 Potential to Achieve Goals: Good Pt will go Supine/Side to Sit: with supervision PT Goal: Supine/Side to Sit - Progress: Progressing toward goal Pt will go Sit to Supine/Side: with supervision PT Goal: Sit to Supine/Side - Progress: Progressing toward goal Pt will go Sit to Stand: with supervision PT Goal: Sit to Stand - Progress: Progressing toward goal Pt will go Stand to Sit: with supervision PT Goal: Stand to Sit - Progress: Progressing toward goal Pt will Ambulate: 51 - 150 feet;with supervision;with rolling walker PT Goal: Ambulate - Progress: Met Pt will Go Up / Down Stairs: 3-5 stairs;with min assist;with least restrictive assistive device PT Goal: Up/Down Stairs - Progress: Met  Visit Information  Last PT Received On: 03/07/12 Assistance Needed: +1    Subjective Data  Subjective: My knee feels better after I move it.  Patient Stated Goal: DC home   Cognition  Overall Cognitive Status: Appears within functional limits for tasks assessed/performed Arousal/Alertness: Awake/alert Orientation Level: Appears intact for tasks assessed Behavior During Session: [facility] for tasks performed    Balance  Balance Balance Assessed: Yes Dynamic Standing Balance Dynamic Standing - Level of Assistance: 4: Min assist;Other (comment) (to pull up gown)  End of Session PT - End of Session Equipment Utilized During Treatment: Gait belt;Left knee immobilizer Activity Tolerance: Patient limited by pain Patient left: with call bell/phone within reach;with family/visitor present;in bed Nurse Communication:  Mobility  status CPM Left Knee CPM Left Knee: Off   GP     Ralene Bathe Kistler 03/07/2012, 2:35 PM [phone]

## 2012-03-07 NOTE — Progress Notes (Signed)
ANTICOAGULATION CONSULT NOTE - Follow Up Consult  Pharmacy Consult for Warfarin Indication: VTE prophylaxis  Patient Measurements: Height: 5\' 11"  (180.3 cm) Weight: 212 lb (96.163 kg) IBW/kg (Calculated) : 75.3   Vital Signs: Temp: 98.8 F (37.1 C) (06/26 0539) BP: 123/66 mmHg (06/26 0539) Pulse Rate: 92  (06/26 0539)  Labs:  Basename 03/07/12 0420 03/06/12 0426  HGB 9.3* 11.3*  HCT 27.5* 33.5*  PLT 174 213  APTT -- --  LABPROT 18.4* 14.6  INR 1.50* 1.12  HEPARINUNFRC -- --  CREATININE 1.66* 1.49*  CKTOTAL -- --  CKMB -- --  TROPONINI -- --   Estimated Creatinine Clearance: 44.1 ml/min (by C-G formula based on Cr of 1.66).  Assessment:  50 YOM s/p left TKA 03/05/12, now on warfarin for VTE prophylaxis.   INR responding after warfarin 7.5mg  daily x 2 doses.  Goal of Therapy:  INR 2-3    Plan:   Warfarin 5mg  PO tonight at 1800 x1  Daily PT/INR  Coumadin education was provided 03/06/12.   Hope Budds, PharmD, BCPS Pager: [phone] 03/07/2012 9:08 AM

## 2012-03-08 DIAGNOSIS — D62 Acute posthemorrhagic anemia: Secondary | ICD-10-CM

## 2012-03-08 LAB — CBC
HCT: 24.7 % — ABNORMAL LOW (ref 39.0–52.0)
Hemoglobin: 8.5 g/dL — ABNORMAL LOW (ref 13.0–17.0)
MCV: 93.2 fL (ref 78.0–100.0)
RBC: 2.65 MIL/uL — ABNORMAL LOW (ref 4.22–5.81)
RDW: 13.6 % (ref 11.5–15.5)
WBC: 5.8 10*3/uL (ref 4.0–10.5)

## 2012-03-08 LAB — BASIC METABOLIC PANEL
CO2: 25 mEq/L (ref 19–32)
Calcium: 8.1 mg/dL — ABNORMAL LOW (ref 8.4–10.5)
Chloride: 108 mEq/L (ref 96–112)
Glucose, Bld: 100 mg/dL — ABNORMAL HIGH (ref 70–99)
Potassium: 3.8 mEq/L (ref 3.5–5.1)
Sodium: 140 mEq/L (ref 135–145)

## 2012-03-08 LAB — PROTIME-INR: INR: 2.34 — ABNORMAL HIGH (ref 0.00–1.49)

## 2012-03-08 MED ORDER — WARFARIN SODIUM 5 MG PO TABS
5.0000 mg | ORAL_TABLET | Freq: Once | ORAL | Status: DC
  Filled 2012-03-08: qty 1

## 2012-03-08 MED ORDER — METHOCARBAMOL 500 MG PO TABS: 500.0000 mg | ORAL_TABLET | Freq: Four times a day (QID) | ORAL | Status: AC | PRN

## 2012-03-08 MED ORDER — OXYCODONE HCL 5 MG PO TABS: 5.0000 mg | ORAL_TABLET | ORAL | Status: AC | PRN

## 2012-03-08 MED ORDER — WARFARIN SODIUM 5 MG PO TABS: 5.0000 mg | ORAL_TABLET | Freq: Once | ORAL | Status: DC

## 2012-03-08 MED ORDER — LIDOCAINE HCL (PF) 1 % IJ SOLN
30.0000 mL | Freq: Once | INTRAMUSCULAR | Status: DC
  Filled 2012-03-08: qty 30

## 2012-03-08 MED ORDER — METHYLPREDNISOLONE ACETATE 40 MG/ML IJ SUSP
80.0000 mg | Freq: Once | INTRAMUSCULAR | Status: DC
  Filled 2012-03-08: qty 2

## 2012-03-08 NOTE — Discharge Instructions (Signed)
Dr. Ollen Gross Total Joint Specialist [facility] [address] [place], [place] [address] [phone]  TOTAL KNEE REPLACEMENT POSTOPERATIVE DIRECTIONS    Knee Rehabilitation, Guidelines Following Surgery  Results after knee surgery are often greatly improved when you follow the exercise, range of motion and muscle strengthening exercises prescribed by your doctor. Safety measures are also important to protect the knee from further injury. Any time any of these exercises cause you to have increased pain or swelling in your knee joint, decrease the amount until you are comfortable again and slowly increase them. If you have problems or questions, call your caregiver or physical therapist for advice.   HOME CARE INSTRUCTIONS  Remove items at home which could result in a fall. This includes throw rugs or furniture in walking pathways.  Continue medications as instructed at time of discharge. You may have some home medications which will be placed on hold until you complete the course of blood thinner medication.  You may start showering once you are discharged home but do not submerge the incision under water. Just pat the incision dry and apply a dry gauze dressing on daily. Walk with walker as instructed.   Use walker as long as suggested by your caregivers.  Avoid periods of inactivity such as sitting longer than an hour when not asleep. This helps prevent blood clots.  You may put full weight on your legs and walk as much as is comfortable.  You may resume a sexual relationship in one month or when given the OK by your doctor.  You may return to work once you are cleared by your doctor.  Do not drive a car for 6 weeks or until released by you surgeon.   Do not drive while taking narcotics.  Wear the elastic stockings for three weeks following surgery during the day but you may remove then at night. Make sure you keep all of your appointments after your  operation with all of your doctors and caregivers. You should call the office at the above phone number and make an appointment for approximately two weeks after the date of your surgery. Change the dressing daily and reapply a dry dressing each time. Please pick up a stool softener and laxative for home use as long as you are requiring pain medications.  Continue to use ice on the knee for pain and swelling from surgery. You may notice swelling that will progress down to the foot and ankle.  This is normal after surgery.  Elevate the leg when you are not up walking on it.   It is important for you to complete the blood thinner medication as prescribed by your doctor.  Continue to use the breathing machine which will help keep your temperature down.  It is common for your temperature to cycle up and down following surgery, especially at night when you are not up moving around and exerting yourself.  The breathing machine keeps your lungs expanded and your temperature down.  RANGE OF MOTION AND STRENGTHENING EXERCISES  Rehabilitation of the knee is important following a knee injury or an operation. After just a few days of immobilization, the muscles of the thigh which control the knee become weakened and shrink (atrophy). Knee exercises are designed to build up the tone and strength of the thigh muscles and to improve knee motion. Often times heat used for twenty to thirty minutes before working out will loosen up your tissues and help with improving the range  of motion but do not use heat for the first two weeks following surgery. These exercises can be done on a training (exercise) mat, on the floor, on a table or on a bed. Use what ever works the best and is most comfortable for you Knee exercises include:  Leg Lifts - While your knee is still immobilized in a splint or cast, you can do straight leg raises. Lift the leg to 60 degrees, hold for 3 sec, and slowly lower the leg. Repeat 10-20 times 2-3  times daily. Perform this exercise against resistance later as your knee gets better.  Quad and Hamstring Sets - Tighten up the muscle on the front of the thigh (Quad) and hold for 5-10 sec. Repeat this 10-20 times hourly. Hamstring sets are done by pushing the foot backward against an object and holding for 5-10 sec. Repeat as with quad sets.  A rehabilitation program following serious knee injuries can speed recovery and prevent re-injury in the future due to weakened muscles. Contact your doctor or a physical therapist for more information on knee rehabilitation.   SKILLED REHAB INSTRUCTIONS: If the patient is transferred to a skilled rehab facility following release from the hospital, a list of the current medications will be sent to the facility for the patient to continue.  When discharged from the skilled rehab facility, please have the facility set up the patient's Home Health Physical Therapy prior to being released. Also, the skilled facility will be responsible for providing the patient with their medications at time of release from the facility to include their pain medication, the muscle relaxants, and their blood thinner medication. If the patient is still at the rehab facility at time of the two week follow up appointment, the skilled rehab facility will also need to assist the patient in arranging follow up appointment in our office and any transportation needs.  MAKE SURE YOU:  Understand these instructions.  Will watch your condition.  Will get help right away if you are not doing well or get worse.  Document Released: 08/29/2005 Document Revised: 08/18/2011 Document Reviewed: 02/16/2007  [facility] Patient Information 2012 [place], [place].   Take Coumadin for three weeks and then discontinue.  The dose may need to be adjusted based upon the INR.  Please follow the INR and titrate Coumadin dose for a therapeutic range between 2.0 and 3.0 INR.  After completing the three weeks of  Coumadin, the patient may stop the Coumadin and resume their 325 mg Aspirin daily.

## 2012-03-08 NOTE — Discharge Summary (Signed)
Physician Discharge Summary   Patient ID: Michael [name] MRN: [id number] DOB/AGE: 76/16/1933 76 y.o.  Admit date: 03/05/2012 Discharge date: 03/08/2012  Primary Diagnosis: Osteoarthritis Left Knee   Admission Diagnoses:  Past Medical History  Diagnosis Date  . Colon cancer 1988  . Ventral hernia 1988  . HLD (hyperlipidemia)   . GERD (gastroesophageal reflux disease)   . Rotator cuff rupture     Right; s/p repair  . BPH (benign prostatic hypertrophy)   . Abnormality of gait   . Unspecified hearing loss   . Impotence of organic origin   . Renal insufficiency     stones  . Arthritis   . HTN (hypertension)     EKG with clearance and ov Dr Dallas Schimke  on chart and EPIC 02/23/12,  CBC with diff, BMET and Hepatic panel EPIC ffrom PCP   Discharge Diagnoses:   Principal Problem:  *OA (osteoarthritis) of knee Active Problems:  Postop Acute blood loss anemia  Procedure:  Procedure(s) (LRB): TOTAL KNEE ARTHROPLASTY (Left)   Consults: None  HPI: Michael [name] is a 78 y.o. year old male with end stage OA of his left knee with progressively worsening pain and dysfunction. He has constant pain, with activity and at rest and significant functional deficits with difficulties even with ADLs. He has had extensive non-op management including analgesics, injections of cortisone and viscosupplements, and home exercise program, but remains in significant pain with significant dysfunction. Radiographs show bone on bone arthritis all 3 compartments with subchondral sclerosis. He presents now for left Total Knee Arthroplasty.    Laboratory Data: Hospital Outpatient Visit on 02/23/2012  Component Date Value Range Status  . aPTT 02/23/2012 30  24 - 37 seconds Final  . Prothrombin Time 02/23/2012 13.8  11.6 - 15.2 seconds Final  . INR 02/23/2012 1.04  0.00 - 1.49 Final  . Color, Urine 02/23/2012 YELLOW  YELLOW Final  . APPearance 02/23/2012 CLEAR  CLEAR Final  . Specific Gravity, Urine 02/23/2012  1.024  1.005 - 1.030 Final  . pH 02/23/2012 5.5  5.0 - 8.0 Final  . Glucose, UA 02/23/2012 NEGATIVE  NEGATIVE mg/dL Final  . Hgb urine dipstick 02/23/2012 NEGATIVE  NEGATIVE Final  . Bilirubin Urine 02/23/2012 NEGATIVE  NEGATIVE Final  . Ketones, ur 02/23/2012 NEGATIVE  NEGATIVE mg/dL Final  . Protein, ur 40/98/1191 NEGATIVE  NEGATIVE mg/dL Final  . Urobilinogen, UA 02/23/2012 0.2  0.0 - 1.0 mg/dL Final  . Nitrite 47/82/9562 NEGATIVE  NEGATIVE Final  . Leukocytes, UA 02/23/2012 NEGATIVE  NEGATIVE Final   MICROSCOPIC NOT DONE ON URINES WITH NEGATIVE PROTEIN, BLOOD, LEUKOCYTES, NITRITE, OR GLUCOSE <1000 mg/dL.  Marland Kitchen MRSA, PCR 02/23/2012 NEGATIVE  NEGATIVE Final  . Staphylococcus aureus 02/23/2012 POSITIVE* NEGATIVE Final   Comment:                                 The Xpert SA Assay (FDA                          approved for NASAL specimens                          only), is one component of                          a comprehensive surveillance  program.  It is not intended                          to diagnose infection nor to                          guide or monitor treatment.                          COMPLETED AFTER HOURS    Basename 03/08/12 0433 03/07/12 0420 03/06/12 0426  HGB 8.5* 9.3* 11.3*    Basename 03/08/12 0433 03/07/12 0420  WBC 5.8 10.6*  RBC 2.65* 2.97*  HCT 24.7* 27.5*  PLT 140* 174    Basename 03/08/12 0433 03/07/12 0420  NA 140 134*  K 3.8 3.7  CL 108 102  CO2 25 24  BUN 32* 46*  CREATININE 1.36* 1.66*  GLUCOSE 100* 110*  CALCIUM 8.1* 8.1*    Basename 03/08/12 0433 03/07/12 0420  LABPT -- --  INR 2.34* 1.50*    X-Rays:Dg Chest 2 View  02/23/2012  *RADIOLOGY REPORT*  Clinical Data: Preop knee replacement  CHEST - 2 VIEW  Comparison: None.  Findings: Normal mediastinum and heart silhouette.  Hemidiaphragms are flattened.  Mild degenerative spurring of the spine.  No effusion, infiltrate,  or pneumothorax.  IMPRESSION: No acute  cardiopulmonary process.  Original Report Authenticated By: Genevive Bi, M.D.   Ct Abdomen Pelvis W Contrast  02/29/2012  *RADIOLOGY REPORT*  Clinical Data: Evaluate liver cysts  CT ABDOMEN AND PELVIS WITH CONTRAST  Technique:  Multidetector CT imaging of the abdomen and pelvis was performed following the standard protocol during bolus administration of intravenous contrast.  Contrast: 80mL OMNIPAQUE IOHEXOL 300 MG/ML  SOLN  Comparison: None  Findings: The lung bases are clear.  No pericardial or pleural effusion.  Simple appearing cysts are noted within the liver.  No worrisome liver lesions identified.  The gallbladder is normal.  No biliary ductal dilatation.  Normal appearance of the pancreas.  The spleen is normal.  The adrenal glands are both unremarkable.  There is a nonobstructing stone within the right kidney measuring 3 mm. Simple appearing cyst is noted within the inferior pole of the right kidney.  Small nonobstructing calculus is noted within the left kidney.  Within the lower pole of the left kidney there is a cyst which measures 1.5 cm, image 37.  The urinary bladder appears normal.  Prostate gland and seminal vesicles are negative.  The patient has a small hiatal hernia.  The small bowel loops have a normal course and caliber.  Normal appearance of the colon. Postoperative changes involving the sigmoid colon identified consistent with partial colectomy.  Review of the visualized osseous structures is significant for lumbar degenerative disc disease.  IMPRESSION:  1.  No acute findings within the abdomen or pelvis. 2.  Renal and hepatic cysts. 3.  Nonobstructing bilateral renal calculi.  Original Report Authenticated By: Rosealee Albee, M.D.    EKG: Orders placed in visit on 02/23/12  . EKG 12-LEAD     Hospital Course: Patient was admitted to [facility] and taken to the OR and underwent the above state procedure without complications.  Patient tolerated the procedure well  and was later transferred to the recovery room and then to the orthopaedic floor for postoperative care.  They were given PO and IV analgesics for pain control following their  surgery.  They were given 24 hours of postoperative antibiotics and started on DVT prophylaxis in the form of Lovenox and Coumadin.   PT and OT were ordered for total joint protocol.  Discharge planning consulted to help with postop disposition and equipment needs.  Patient had a decent night on the evening of surgery and started to get up OOB with therapy on day one.  PCA was discontinued and they were weaned over to PO meds.  Hemovac drain was pulled without difficulty.  Continued to work with therapy into day two.  Dressing was changed on day two and the incision was healing well.  By day three, the patient had progressed with therapy and meeting their goals.  Incision was healing well.  Patient was seen in rounds and was ready to go home.  Discharge Medications: Prior to Admission medications   Medication Sig Start Date End Date Taking? Authorizing Provider  diltiazem (CARDIZEM CD) 240 MG 24 hr capsule Take 1 capsule (240 mg total) by mouth daily. 05/09/11   [name], MD  lovastatin (MEVACOR) 20 MG tablet TAKE ONE TABLET BY MOUTH EVERY DAY 01/23/12   [name], MD  methocarbamol (ROBAXIN) 500 MG tablet Take 1 tablet (500 mg total) by mouth every 6 (six) hours as needed. 03/08/12 03/18/12  Sagrario Lineberry, PA  omeprazole (PRILOSEC) 20 MG capsule Take 1 capsule (20 mg total) by mouth daily. 06/20/11 06/19/12  [name], MD  oxyCODONE (OXY IR/ROXICODONE) 5 MG immediate release tablet Take 1-2 tablets (5-10 mg total) by mouth every 4 (four) hours as needed for pain. 03/08/12 03/18/12  Jayvan Mcshan, PA  traMADol (ULTRAM) 50 MG tablet Take 50 mg by mouth every 6 (six) hours as needed. pain    Historical Provider, MD  warfarin (COUMADIN) 5 MG tablet Take 1 tablet (5 mg total) by mouth one time only at 6 PM. Take  Coumadin for three weeks and then discontinue.  The dose may need to be adjusted based upon the INR.  Please follow the INR and titrate Coumadin dose for a therapeutic range between 2.0 and 3.0 INR.  After completing the three weeks of Coumadin, the patient may stop the Coumadin and resume their 325 mg Aspirin daily. 03/08/12 03/08/13  Macarena Langseth, PA  zolpidem (AMBIEN) 10 MG tablet Take 1 tablet (10 mg total) by mouth at bedtime as needed. For sleep 02/23/12   [name], MD    Diet: Cardiac diet Activity:WBAT Follow-up:in 2 weeks Disposition - Home Discharged Condition: good   Discharge Orders    Future Orders Please Complete By Expires   Diet - low sodium heart healthy      Call MD / Call 911      Comments:   If you experience chest pain or shortness of breath, CALL 911 and be transported to the hospital emergency room.  If you develope a fever above 101 F, pus (white drainage) or increased drainage or redness at the wound, or calf pain, call your surgeon's office.   Discharge instructions      Comments:   Pick up stool softner and laxative for home. Do not submerge incision under water. May shower. Continue to use ice for pain and swelling from surgery.  Take Coumadin for three weeks and then discontinue.  The dose may need to be adjusted based upon the INR.  Please follow the INR and titrate Coumadin dose for a therapeutic range between 2.0 and 3.0 INR.  After completing the three weeks of Coumadin,  the patient may stop the Coumadin and resume their 325 mg Aspirin daily.   Constipation Prevention      Comments:   Drink plenty of fluids.  Prune juice may be helpful.  You may use a stool softener, such as Colace (over the counter) 100 mg twice a day.  Use MiraLax (over the counter) for constipation as needed.   Increase activity slowly as tolerated      Patient may shower      Comments:   You may shower without a dressing once there is no drainage.  Do not wash over the  wound.  If drainage remains, do not shower until drainage stops.   Driving restrictions      Comments:   No driving until released by the physician.   Lifting restrictions      Comments:   No lifting until released by the physician.   TED hose      Comments:   Use stockings (TED hose) for 3 weeks on both leg(s).  You may remove them at night for sleeping.   Change dressing      Comments:   Change dressing daily with sterile 4 x 4 inch gauze dressing and apply TED hose. Do not submerge the incision under water.   Do not put a pillow under the knee. Place it under the heel.      Do not sit on low chairs, stoools or toilet seats, as it may be difficult to get up from low surfaces        Medication List  As of 03/08/2012  9:58 AM   STOP taking these medications         ALEVE 220 MG Caps      aspirin 325 MG tablet      multivitamin tablet         TAKE these medications         diltiazem 240 MG 24 hr capsule   Commonly known as: CARDIZEM CD   Take 1 capsule (240 mg total) by mouth daily.      lovastatin 20 MG tablet   Commonly known as: MEVACOR   TAKE ONE TABLET BY MOUTH EVERY DAY      methocarbamol 500 MG tablet   Commonly known as: ROBAXIN   Take 1 tablet (500 mg total) by mouth every 6 (six) hours as needed.      omeprazole 20 MG capsule   Commonly known as: PRILOSEC   Take 1 capsule (20 mg total) by mouth daily.      oxyCODONE 5 MG immediate release tablet   Commonly known as: Oxy IR/ROXICODONE   Take 1-2 tablets (5-10 mg total) by mouth every 4 (four) hours as needed for pain.      traMADol 50 MG tablet   Commonly known as: ULTRAM   Take 50 mg by mouth every 6 (six) hours as needed. pain      warfarin 5 MG tablet   Commonly known as: COUMADIN   Take 1 tablet (5 mg total) by mouth one time only at 6 PM. Take Coumadin for three weeks and then discontinue.  The dose may need to be adjusted based upon the INR.  Please follow the INR and titrate Coumadin dose for a  therapeutic range between 2.0 and 3.0 INR.  After completing the three weeks of Coumadin, the patient may stop the Coumadin and resume their 325 mg Aspirin daily.      zolpidem 10 MG tablet  Commonly known as: AMBIEN   Take 1 tablet (10 mg total) by mouth at bedtime as needed. For sleep           Follow-up Information    Follow up with Loanne Drilling, MD. Schedule an appointment as soon as possible for a visit in 2 weeks.   Contact information:   [facility] [address], [address] [place] [address] [phone]          Signed: Patrica Duel 03/08/2012, 9:58 AM

## 2012-03-08 NOTE — Progress Notes (Signed)
Subjective: 3 Days Post-Op Procedure(s) (LRB): TOTAL KNEE ARTHROPLASTY (Left) Patient reports pain as mild.   Patient seen in rounds with Dr. Lequita Halt. Patient is well, and has had no acute complaints or problems Patient is ready to go home.  Objective: Vital signs in last 24 hours: Temp:  [98 F (36.7 C)-99.2 F (37.3 C)] 98.9 F (37.2 C) (06/27 0628) Pulse Rate:  [79-93] 90  (06/27 0628) Resp:  [16-18] 16  (06/27 0628) BP: (118-152)/(64-74) 135/72 mmHg (06/27 0918) SpO2:  [91 %-96 %] 96 % (06/27 0628)  Intake/Output from previous day:  Intake/Output Summary (Last 24 hours) at 03/08/12 0949 Last data filed at 03/08/12 0920  Gross per 24 hour  Intake 1015.67 ml  Output   4375 ml  Net -3359.33 ml    Intake/Output this shift: Total I/O In: -  Out: 275 [Urine:275]  Labs:  [facility] 03/08/12 0433 03/07/12 0420 03/06/12 0426  HGB 8.5* 9.3* 11.3*    Basename 03/08/12 0433 03/07/12 0420  WBC 5.8 10.6*  RBC 2.65* 2.97*  HCT 24.7* 27.5*  PLT 140* 174    Basename 03/08/12 0433 03/07/12 0420  NA 140 134*  K 3.8 3.7  CL 108 102  CO2 25 24  BUN 32* 46*  CREATININE 1.36* 1.66*  GLUCOSE 100* 110*  CALCIUM 8.1* 8.1*    Basename 03/08/12 0433 03/07/12 0420  LABPT -- --  INR 2.34* 1.50*    EXAM: General - Patient is Alert, Appropriate and Oriented Extremity - Neurovascular intact Sensation intact distally Dorsiflexion/Plantar flexion intact Incision - clean, dry, no drainage, healing Motor Function - intact, moving foot and toes well on exam.   Assessment/Plan: 3 Days Post-Op Procedure(s) (LRB): TOTAL KNEE ARTHROPLASTY (Left) Procedure(s) (LRB): TOTAL KNEE ARTHROPLASTY (Left) Past Medical History  Diagnosis Date  . Colon cancer 1988  . Ventral hernia 1988  . HLD (hyperlipidemia)   . GERD (gastroesophageal reflux disease)   . Rotator cuff rupture     Right; s/p repair  . BPH (benign prostatic hypertrophy)   . Abnormality of gait   . Unspecified hearing  loss   . Impotence of organic origin   . Renal insufficiency     stones  . Arthritis   . HTN (hypertension)     EKG with clearance and ov Dr Dallas Schimke  on chart and EPIC 02/23/12,  CBC with diff, BMET and Hepatic panel EPIC ffrom PCP   Principal Problem:  *OA (osteoarthritis) of knee Active Problems:  Postop Acute blood loss anemia   Up with therapy Discharge home with home health Diet - Cardiac diet Follow up - in 2 weeks Activity - WBAT Disposition - Home Condition Upon Discharge - Good D/C Meds - See DC Summary DVT Prophylaxis - Coumadin  Clary Boulais 03/08/2012, 9:49 AM

## 2012-03-08 NOTE — Progress Notes (Signed)
ANTICOAGULATION CONSULT NOTE - Follow Up Consult  Pharmacy Consult for Warfarin Indication: VTE prophylaxis  Patient Measurements: Height: 5\' 11"  (180.3 cm) Weight: 212 lb (96.163 kg) IBW/kg (Calculated) : 75.3   Vital Signs: Temp: 98.9 F (37.2 C) (06/27 0628) Temp src: Oral (06/27 0628) BP: 135/72 mmHg (06/27 0918) Pulse Rate: 90  (06/27 0628)  Labs:  Basename 03/08/12 0433 03/07/12 0420 03/06/12 0426  HGB 8.5* 9.3* --  HCT 24.7* 27.5* 33.5*  PLT 140* 174 213  APTT -- -- --  LABPROT 26.0* 18.4* 14.6  INR 2.34* 1.50* 1.12  HEPARINUNFRC -- -- --  CREATININE 1.36* 1.66* 1.49*  CKTOTAL -- -- --  CKMB -- -- --  TROPONINI -- -- --   Estimated Creatinine Clearance: 53.9 ml/min (by C-G formula based on Cr of 1.36).  Inpatient warfarin doses this admission (6/24 -6/26): 7.5, 7.5, 5mg .  Assessment:  50 YOM s/p left TKA 03/05/12, now on warfarin for VTE prophylaxis.   INR therapeutic.  Orders noted for discharge today on warfarin 5mg  daily  Goal of Therapy:  INR 2-3    Recommend:   PT/INR on 03/09/12 and 03/12/12, then at least twice weekly.  Titrate warfarin to maintain INR 2-3.  Post-discharge follow-up per ortho.   Hope Budds, PharmD, BCPS Pager: [phone] 03/08/2012 10:09 AM

## 2012-03-08 NOTE — Progress Notes (Signed)
Physical Therapy Treatment Patient Details Name: Michael [name] MRN: [id number] DOB: [birth year] Today's Date: 03/08/2012 Time: 4098-1191 PT Time Calculation (min): 36 min  PT Assessment / Plan / Recommendation Comments on Treatment Session  Pt noting Right knee pain today which limits activity tolerance. R knee buckled with stand to sit. RN/PA notified, pt requesting cortizone shot, PA notified. Will repeat stair training later today with pt's wife present.     Follow Up Recommendations  Home health PT    Barriers to Discharge        Equipment Recommendations  Rolling walker with 5" wheels;3 in 1 bedside comode    Recommendations for Other Services    Frequency 7X/week   Plan Discharge plan remains appropriate;Frequency remains appropriate    Precautions / Restrictions Precautions Precautions: Knee Required Braces or Orthoses: Knee Immobilizer - Left Knee Immobilizer - Left: Discontinue once straight leg raise with < 10 degree lag Restrictions Weight Bearing Restrictions: No   Pertinent Vitals/Pain *6/10 R knee, 3/10 L knee Pt premedicated, ice applied PA/RN notified of new R knee pain and pt request for cortisone shot**    Mobility  Bed Mobility Supine to Sit: 4: Min assist;HOB flat Details for Bed Mobility Assistance: min A to support LLE Transfers Sit to Stand: 4: Min assist;With upper extremity assist;From chair/3-in-1 Stand to Sit: 4: Min assist;With upper extremity assist;To chair/3-in-1 Details for Transfer Assistance: R knee (non-operative LE)  buckled with stand to sit Ambulation/Gait Ambulation/Gait Assistance: 5: Supervision Ambulation Distance (Feet): 120 Feet Assistive device: Rolling walker Gait Pattern: Step-to pattern General Gait Details: safe/steady gait, pt c/o more pain in R knee with walking, PA notified    Exercises Total Joint Exercises Ankle Circles/Pumps: AROM;Supine;10 reps;Both Quad Sets: AROM;Supine;Both;5 reps Short Arc Quad: Left;10  reps;Supine;AAROM Heel Slides: AAROM;Supine;Left;15 reps Straight Leg Raises: AAROM;Left;10 reps;Supine   PT Diagnosis:    PT Problem List:   PT Treatment Interventions:     PT Goals Acute Rehab PT Goals PT Goal Formulation: With patient/family Time For Goal Achievement: 03/12/12 Potential to Achieve Goals: Good Pt will go Supine/Side to Sit: with supervision PT Goal: Supine/Side to Sit - Progress: Progressing toward goal Pt will go Sit to Supine/Side: with supervision Pt will go Sit to Stand: with supervision PT Goal: Sit to Stand - Progress: Progressing toward goal Pt will go Stand to Sit: with supervision PT Goal: Stand to Sit - Progress: Progressing toward goal Pt will Ambulate: 51 - 150 feet;with supervision;with rolling walker PT Goal: Ambulate - Progress: Met Pt will Go Up / Down Stairs: 3-5 stairs;with min assist;with least restrictive assistive device  Visit Information  Last PT Received On: 03/08/12 Assistance Needed: +1    Subjective Data  Subjective: My RIGHT (non-operative) knee is killing me! It hurts to touch it or move it. I think I need a cortizone shot. Patient Stated Goal: decrease R knee pain   Cognition  Overall Cognitive Status: Appears within functional limits for tasks assessed/performed Arousal/Alertness: Awake/alert Orientation Level: Appears intact for tasks assessed Behavior During Session: [facility] for tasks performed    Balance     End of Session PT - End of Session Equipment Utilized During Treatment: Gait belt;Left knee immobilizer Activity Tolerance: Patient limited by pain Patient left: in chair;with call bell/phone within reach;with family/visitor present Nurse Communication: Mobility status   GP     Ralene Bathe Kistler 03/08/2012, 11:22 AM [phone]

## 2012-03-08 NOTE — Progress Notes (Addendum)
Occupational Therapy Treatment Patient Details Name: Michael [name] MRN: [id number] DOB: 22-Oct-1931 Today's Date: 03/08/2012 Time: 1030-1057 OT Time Calculation (min): 27 min  OT Assessment / Plan / Recommendation Comments on Treatment Session Pt supposed to be discharging home today. Pt now with R knee hurting also. PA planning cortisone shot and drawing off fluid. No further questions by pt or wife for OT.Wife and pt plan sponge bathing initially. Pt and wife feel comfortable with toilet transfer and B/D. Would benefit from additional practice if for some reason pt is still here after today but no further concerns by pt or family at the end of this visit.    Follow Up Recommendations  No OT follow up    Barriers to Discharge       Equipment Recommendations  Rolling walker with 5" wheels;3 in 1 bedside comode    Recommendations for Other Services    Frequency Min 2X/week   Plan Discharge plan remains appropriate    Precautions / Restrictions Precautions Precautions: Knee Required Braces or Orthoses: Knee Immobilizer - Left Knee Immobilizer - Left: Discontinue once straight leg raise with < 10 degree lag Restrictions Weight Bearing Restrictions: No        ADL  Toilet Transfer: Simulated;Minimal assistance Toilet Transfer Method: Other (comment) (ambulating) Toileting - Clothing Manipulation and Hygiene: Simulated;Minimal assistance Where Assessed - Engineer, mining and Hygiene: Standing Tub/Shower Transfer: Simulated;Minimal assistance Tub/Shower Transfer Method: Other (comment) (simulate step back over ledge and then out forwards) Equipment Used: Rolling walker ADL Comments: Helped with adjusting new RW height. Discussed safety with 3in1 use with pt and wife including how to adjust for proper height, use of armrests, etc. Educated wife on how to don and doff KI. Discussed use of ice periodically throughout day. Pt struggled with simulating stepping back over  shower ledge as his R knee is now hurting (reports it will eventually need to be replaced). PA aware and came in during session and is planning a cortisone shot and drawing fluid off. Pt had difficulty clearing L LE over ledge while maintaining weight on R LE due to pain. Advised that pt wait on shower transfer a few days until stronger and less discomfort and pain especially since R LE is now bothering him. Advised that HHPT can do another practice trial of shower transfer once home also. Wife and pt verbalize understanding. Informed PT that pt with difficulty stepping back over ledge simulated for shower as pt has stairs to get into his house. PT states they will practice stairs after getting cortisone shot and fluid drawn off to practice again.     OT Diagnosis:    OT Problem List:   OT Treatment Interventions:     OT Goals ADL Goals ADL Goal: Toilet Transfer - Progress: Progressing toward goals ADL Goal: Toileting - Clothing Manipulation - Progress: Progressing toward goals ADL Goal: Tub/Shower Transfer - Progress: Progressing toward goals  Visit Information  Last OT Received On: 03/08/12 Assistance Needed: +1    Subjective Data  Subjective: they havent brought the walker or commode yet Patient Stated Goal: home   Prior Functioning       Cognition  Overall Cognitive Status: Appears within functional limits for tasks assessed/performed Arousal/Alertness: Awake/alert Orientation Level: Appears intact for tasks assessed Behavior During Session: [facility] for tasks performed    Mobility Bed Mobility Supine to Sit: 4: Min assist;HOB flat Details for Bed Mobility Assistance: min A to support LLE Transfers Transfers: Sit to Stand;Stand to [employer]  to Stand: 4: Min assist;With upper extremity assist;From chair/3-in-1 Stand to Sit: 4: Min assist;With upper extremity assist;To chair/3-in-1 Details for Transfer Assistance: R knee (non-operative LE)  buckled with stand to sit      Balance      End of Session OT - End of Session Activity Tolerance: Patient limited by pain;Other (comment) (now with R knee also) Patient left: in chair;with call bell/phone within reach;with family/visitor present  GO     Lennox Laity [phone] 03/08/2012, 11:19 AM

## 2012-05-09 ENCOUNTER — Telehealth: Payer: Self-pay | Admitting: Family Medicine

## 2012-05-09 NOTE — Telephone Encounter (Signed)
Terrill can be reached at [phone]  [facility] Terrill has had dizziness x several weeks.  Has been taking sitting and standing  blood pressure x 3 days. Blood Pressure at 11:30 sitting 134/82, standing 86/55. States he spoke with pharmacist who told him he has Orthostatic Hypotension.  Takes Diltiazem 240 mg every AM. "Was told to watch for a drop of 52mm/Hg systolic or 10 mm/HG diastolic." Pharmacist states Diltiazem caused orthostatic hypertension more than any other medication for Hypertension. Per hypertension protocol has call provider within 8 hr disposition due to sudden drop in blood pressure ( 85mm/Hg or nore than usual reading ). Has previously scheduled appointment for 11:30 on 8/29 with Dr. Sharen Hones. Was taking Tramadol, A muscle relaxer, Ambien, Omeprazole. Had total knee replacement in June 2013. "Daughter Lyla Son works in office".  Should Terrill hold Diltiazem in AM ?

## 2012-05-09 NOTE — Telephone Encounter (Signed)
Let's have him hold dilt in am until seen.

## 2012-05-10 ENCOUNTER — Ambulatory Visit (INDEPENDENT_AMBULATORY_CARE_PROVIDER_SITE_OTHER): Payer: Medicare Other | Admitting: Family Medicine

## 2012-05-10 ENCOUNTER — Encounter: Payer: Self-pay | Admitting: Family Medicine

## 2012-05-10 VITALS — BP 122/80 | HR 80 | Temp 98.0°F | Wt 207.8 lb

## 2012-05-10 DIAGNOSIS — I951 Orthostatic hypotension: Secondary | ICD-10-CM

## 2012-05-10 LAB — CBC WITH DIFFERENTIAL/PLATELET
Basophils Absolute: 0 10*3/uL (ref 0.0–0.1)
Eosinophils Absolute: 0.1 10*3/uL (ref 0.0–0.7)
Lymphocytes Relative: 14.9 % (ref 12.0–46.0)
MCHC: 31.8 g/dL (ref 30.0–36.0)
Monocytes Relative: 10.6 % (ref 3.0–12.0)
Neutrophils Relative %: 72.2 % (ref 43.0–77.0)
RBC: 4.06 Mil/uL — ABNORMAL LOW (ref 4.22–5.81)
RDW: 16.3 % — ABNORMAL HIGH (ref 11.5–14.6)

## 2012-05-10 LAB — BASIC METABOLIC PANEL
CO2: 27 mEq/L (ref 19–32)
Calcium: 9.1 mg/dL (ref 8.4–10.5)
Creatinine, Ser: 1.8 mg/dL — ABNORMAL HIGH (ref 0.4–1.5)
GFR: 39.99 mL/min — ABNORMAL LOW (ref >60.00)
Glucose, Bld: 86 mg/dL (ref 70–99)
Sodium: 137 mEq/L (ref 135–145)

## 2012-05-10 MED ORDER — DILTIAZEM HCL ER COATED BEADS 120 MG PO CP24: 120.0000 mg | ORAL_CAPSULE | Freq: Every day | ORAL | Status: DC

## 2012-05-10 NOTE — Telephone Encounter (Signed)
Patient's wife notified and will have pt hold med until appt today.

## 2012-05-10 NOTE — Progress Notes (Signed)
  Subjective:    Patient ID: Michael [name], male    DOB: 26-Jul-1932, 78 y.o.   MRN: [id number]  HPI CC: dizziness  Longstanding h/o dizziness "with doing things", recently worsening.  Last Sunday - worsened dizziness with standing up or bending over.  Has been on diltiazem for several years.  Dizziness described as lightheadedness and like going to black out.  Very positional and orthostatic.  Takes diltiazem CD 240mg  daily in am.  Did not take today's dose.  BP fine today.  Wt down 5 lbs.  Brings log of bp standing ranging from 78-108/40-60, sitting range of 134-139/80s.  Told by pharmacist diltiazem would cause these sxs.    L knee replacement 02/2012.  Stopped ambien, tramadol, and robaxin, no noted improvement in dizziness.  sxs worsened since knee replacement.  No chest pain/tightness, SOB, HA, leg swelling, palpitations.  No LOC  BP Readings from Last 3 Encounters:  05/10/12 122/80  03/08/12 135/72  03/08/12 135/72   Wt Readings from Last 3 Encounters:  05/10/12 207 lb 12 oz (94.235 kg)  03/05/12 212 lb (96.163 kg)  03/05/12 212 lb (96.163 kg)    Review of Systems Per HPI    Objective:   Physical Exam  Nursing note and vitals reviewed. Constitutional: He appears well-developed and well-nourished. No distress.  HENT:  Head: Normocephalic and atraumatic.  Mouth/Throat: Oropharynx is clear and moist. No oropharyngeal exudate.  Eyes: Conjunctivae and EOM are normal. Pupils are equal, round, and reactive to light. No scleral icterus.  Neck: Normal range of motion. Neck supple. Carotid bruit is not present.  Cardiovascular: Normal rate, regular rhythm, normal heart sounds and intact distal pulses.   No murmur heard. Pulmonary/Chest: Effort normal and breath sounds normal. No respiratory distress. He has no wheezes. He has no rales.  Musculoskeletal: He exhibits no edema.  Lymphadenopathy:    He has no cervical adenopathy.  Skin: Skin is warm and dry.  Psychiatric: He has  a normal mood and affect.       Assessment & Plan:

## 2012-05-10 NOTE — Assessment & Plan Note (Signed)
Log he brings shows obvious orthostatic hypotension. Will decrease dilt cd to 120mg  daily (1/2 current dose) rtc 1 mo for f/u. Did have post op acute blood loss anemia - check CBC today.  H/o CKD as well - will check today. Encouraged good hydration as well.

## 2012-05-10 NOTE — Patient Instructions (Addendum)
I do think you have orthostatic hypotension - low blood pressure. Cut diltiazem CD in half - only take 120mg  daily.  This should help with dizziness and low blood pressures. Keep an eye on blood pressure.  Return to see [place] in 1 month, sooner if worsening. The other important thing to do is ensure you are staying well hydrated with 6-8 8oz glasses of water.

## 2012-05-16 ENCOUNTER — Ambulatory Visit: Payer: Medicare Other | Admitting: Family Medicine

## 2012-06-11 ENCOUNTER — Ambulatory Visit (INDEPENDENT_AMBULATORY_CARE_PROVIDER_SITE_OTHER): Payer: Medicare Other | Admitting: Family Medicine

## 2012-06-11 ENCOUNTER — Encounter: Payer: Self-pay | Admitting: Family Medicine

## 2012-06-11 VITALS — BP 98/70 | HR 80 | Temp 97.5°F | Resp 20 | Ht 71.0 in | Wt 210.5 lb

## 2012-06-11 DIAGNOSIS — I951 Orthostatic hypotension: Secondary | ICD-10-CM

## 2012-06-11 MED ORDER — AMLODIPINE BESYLATE 5 MG PO TABS: 5.0000 mg | ORAL_TABLET | Freq: Every day | ORAL | Status: DC

## 2012-06-11 NOTE — Progress Notes (Signed)
[facility] at [facility] [address] [place] [address] Phone: [phone] Fax: [phone]  Date:  06/11/2012   Name:  Michael [name]   DOB:  23-Mar-1932   MRN:  [id number] Gender: male Age: 78 y.o.  PCP:  [name], MD    Chief Complaint: Follow-up   History of Present Illness:  Michael [name] is a 78 y.o. pleasant patient who presents with the following:  Saw Rich Ramos, had one ESI. Has been halping his symptoms.   Dizziness, has been doing better.  Still will hav esoymptoms.   Patient was having some orthostatic hypotension, and saw Dr. Reece Agar., and then had his diltiazem decreased from 240 mg to 120 mg, now symptoms are much better. He is still having some occasional dizziness, particularly with motion, but it is much improved compared to before.  He is also still having some back pain, but he is improving after having one epidural steroid injection.  Patient Active Problem List  Diagnosis  . MALIG NEOPLASM OTHER SPEC SITES LARGE INTESTINE  . HYPERLIPIDEMIA  . HEARING LOSS, UNSPEC.  Marland Kitchen HYPERTENSION  . GERD  . CKD (chronic kidney disease) stage 3, GFR 30-59 ml/min  . BENIGN PROSTATIC HYPERTROPHY, WITH URINARY OBSTRUCTION  . GAIT DISTURBANCE  . OA (osteoarthritis) of knee  . Postop Acute blood loss anemia  . Orthostatic hypotension    Past Medical History  Diagnosis Date  . Colon cancer 1988  . Ventral hernia 1988  . HLD (hyperlipidemia)   . GERD (gastroesophageal reflux disease)   . Rotator cuff rupture     Right; s/p repair  . BPH (benign prostatic hypertrophy)   . Abnormality of gait   . Unspecified hearing loss   . Impotence of organic origin   . Renal insufficiency     stones  . Arthritis   . HTN (hypertension)     EKG with clearance and ov Dr Dallas Schimke  on chart and EPIC 02/23/12,  CBC with diff, BMET and Hepatic panel EPIC ffrom PCP    Past Surgical History  Procedure Date  . Hemicoloectomy w/ anastomosis 1988    [name]  . Ventral hernia repair [facility]  . Rotator cuff repair     Right; [facility]  . Vein ligation and stripping     right  . Eye surgery     cataract extraction with IOL bilaterally  . Hernia repair 1963    inguinal  . Rotator cuff repair     right  . Total knee arthroplasty 03/05/2012    Procedure: TOTAL KNEE ARTHROPLASTY;  Surgeon: Loanne Drilling, MD;  Location: [facility]S;  Service: Orthopedics;  Laterality: Left;    History  Substance Use Topics  . Smoking status: Never Smoker   . Smokeless tobacco: Never Used  . Alcohol Use: Yes     Wine (1/2-1 glass 2x/week    Family History  Problem Relation Age of Onset  . Osteoarthritis Father     No Known Allergies  Medication list has been reviewed and updated.  Outpatient Prescriptions Prior to Visit  Medication Sig Dispense Refill  . diltiazem (CARDIZEM CD) 120 MG 24 hr capsule Take 1 capsule (120 mg total) by mouth daily.  30 capsule  11  . lovastatin (MEVACOR) 20 MG tablet TAKE ONE TABLET BY MOUTH EVERY DAY  90 tablet  3  . omeprazole (PRILOSEC) 20 MG capsule Take 1 capsule (20 mg total) by mouth  daily.  30 capsule  11  . zolpidem (AMBIEN) 10 MG tablet Take 1 tablet (10 mg total) by mouth at bedtime as needed. For sleep  30 tablet  3    Review of Systems:   GEN: No acute illnesses, no fevers, chills. GI: No n/v/d, eating normally Pulm: No SOB Interactive and getting along well at home.  Otherwise, ROS is as per the HPI.   Physical Examination: Filed Vitals:   06/11/12 1218  BP: 98/70  Pulse:   Temp:   Resp:    Filed Vitals:   06/11/12 1217  Height:   Weight: 210 lb 8 oz (95.482 kg)   Body mass index is 29.36 kg/(m^2). Ideal Body Weight: Weight in (lb) to have BMI = 25: 178.9    GEN: WDWN, NAD, Non-toxic, A & O x 3 HEENT: Atraumatic, Normocephalic. Neck supple. No masses, No LAD. Ears and Nose: No external deformity. CV: RRR, No M/G/R. No JVD. No thrill. No extra heart  sounds. PULM: CTA B, no wheezes, crackles, rhonchi. No retractions. No resp. distress. No accessory muscle use. EXTR: No c/c/e NEURO Normal gait.  PSYCH: Normally interactive. Conversant. Not depressed or anxious appearing.  Calm demeanor.   MSK: Full range of motion at the hips bilaterally without any significant pain with internal or external range of motion. Denies groin pain.  Assessment and Plan:  1. Orthostatic hypotension    Significant renal impairment. We will try amlodipine instead of diltiazem.  On my check today, his blood pressure is 115/75. He reports an earlier in the day was 135/80.  Basic Metabolic Panel:    Component Value Date/Time   NA 137 05/10/2012 1228   K 4.3 05/10/2012 1228   CL 100 05/10/2012 1228   CO2 27 05/10/2012 1228   BUN 30* 05/10/2012 1228   CREATININE 1.8* 05/10/2012 1228   GLUCOSE 86 05/10/2012 1228   CALCIUM 9.1 05/10/2012 1228     Orders Today:  No orders of the defined types were placed in this encounter.    Updated Medication List: (Includes new medications, updates to list, dose adjustments) Meds ordered this encounter  Medications  . amLODipine (NORVASC) 5 MG tablet    Sig: Take 1 tablet (5 mg total) by mouth daily.    Dispense:  30 tablet    Refill:  5    Medications Discontinued: Medications Discontinued During This Encounter  Medication Reason  . diltiazem (CARDIZEM CD) 120 MG 24 hr capsule      [name], MD

## 2012-06-11 NOTE — Patient Instructions (Addendum)
Iron Sulfate (Iron tablets) 325 mg, 1 by mouth daily   Insomnia:  Melatonin 3-5 mg can be taken 1 hour before sleep very safely every day  Antihistamines: 2 tabs of Benadryl is OK Can also take 2 Dramamine (get the older version that can cause drowsiness) Or Unisom (doxylamine)

## 2012-06-18 ENCOUNTER — Telehealth: Payer: Self-pay | Admitting: Family Medicine

## 2012-06-18 ENCOUNTER — Telehealth: Payer: Self-pay

## 2012-06-18 NOTE — Telephone Encounter (Signed)
Pt started Amlodipine 06/11/12; pt has dizziness on and off since starting Amlodipine.06/17/12 BP 76/45 standing and 99/57 sitting. Last night BP normal.Please advise. [place].

## 2012-06-18 NOTE — Telephone Encounter (Signed)
As noted on earlier note, please have him cut his new blood pressure pill Amlodipine into 1/2 a tablet a day --- that is the only BP med he should be on now. If he is still having problems in a week, call me back, and we will completely stop it.

## 2012-06-18 NOTE — Telephone Encounter (Signed)
Call patient, carried told me BP dropping some with standing.  Cut new BP med into 1/2 a tablet a day, call in a week or so if still having problems, and we might have to stop it all together.

## 2012-06-18 NOTE — Telephone Encounter (Signed)
Patient notified as instructed by telephone. See other phone note dated today.

## 2012-06-18 NOTE — Telephone Encounter (Signed)
Patient notified as instructed by telephone. 

## 2012-07-18 ENCOUNTER — Other Ambulatory Visit: Payer: Self-pay | Admitting: *Deleted

## 2012-07-18 MED ORDER — OMEPRAZOLE 20 MG PO CPDR: 20.0000 mg | DELAYED_RELEASE_CAPSULE | Freq: Every day | ORAL | Status: DC

## 2012-07-31 ENCOUNTER — Other Ambulatory Visit: Payer: Self-pay | Admitting: *Deleted

## 2012-07-31 MED ORDER — AMLODIPINE BESYLATE 2.5 MG PO TABS: 2.5000 mg | ORAL_TABLET | Freq: Every day | ORAL | Status: DC

## 2012-08-29 ENCOUNTER — Other Ambulatory Visit: Payer: Self-pay | Admitting: *Deleted

## 2012-08-29 MED ORDER — ZOLPIDEM TARTRATE 10 MG PO TABS: 10.0000 mg | ORAL_TABLET | Freq: Every evening | ORAL | Status: DC | PRN

## 2012-08-29 NOTE — Telephone Encounter (Signed)
Ok to refill #30, 3 refills

## 2012-08-29 NOTE — Telephone Encounter (Signed)
rx called to pharmacy 

## 2013-01-23 ENCOUNTER — Ambulatory Visit (INDEPENDENT_AMBULATORY_CARE_PROVIDER_SITE_OTHER): Payer: Medicare Other | Admitting: Family Medicine

## 2013-01-23 ENCOUNTER — Encounter: Payer: Self-pay | Admitting: Family Medicine

## 2013-01-23 ENCOUNTER — Ambulatory Visit: Payer: Self-pay | Admitting: Family Medicine

## 2013-01-23 VITALS — BP 118/70 | HR 80 | Temp 98.2°F | Wt 218.2 lb

## 2013-01-23 DIAGNOSIS — N63 Unspecified lump in unspecified breast: Secondary | ICD-10-CM

## 2013-01-23 DIAGNOSIS — N6320 Unspecified lump in the left breast, unspecified quadrant: Secondary | ICD-10-CM

## 2013-01-23 NOTE — Patient Instructions (Addendum)
REFERRAL: GO THE THE FRONT ROOM AT THE ENTRANCE OF OUR CLINIC, NEAR CHECK IN. ASK FOR Michael [name]. SHE WILL HELP YOU SET UP YOUR REFERRAL. DATE: TIME:  

## 2013-01-23 NOTE — Progress Notes (Signed)
[facility] at [facility] [address] [place] [address] Phone: [phone] Fax: [phone]  Date:  01/23/2013   Name:  Michael [name]   DOB:  1932/02/13   MRN:  [id number] Gender: male Age: 79 y.o.  Primary Physician:  [name], MD  Evaluating MD: [name], MD   Chief Complaint: lump left breast   History of Present Illness:  Michael [name] is a 79 y.o. pleasant patient who presents with the following:  L breast 2 weeks. No breast or ovarian cancer history in family. Pain and palpable mass, more on the lateral aspect of chest, adjacent to nipple. Different from the R.   Patient Active Problem List   Diagnosis Date Noted  . Orthostatic hypotension 05/10/2012  . Postop Acute blood loss anemia 03/08/2012  . OA (osteoarthritis) of knee 03/05/2012  . HYPERTENSION 01/08/2010  . BENIGN PROSTATIC HYPERTROPHY, WITH URINARY OBSTRUCTION 01/07/2010  . HEARING LOSS, UNSPEC. 12/08/2008  . GAIT DISTURBANCE 12/08/2008  . HYPERLIPIDEMIA 07/23/2008  . GERD 07/23/2008  . CKD (chronic kidney disease) stage 3, GFR 30-59 ml/min 07/23/2008  . 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 Colon cancer 1988  . Ventral hernia 1988  . HLD (hyperlipidemia)   . GERD (gastroesophageal reflux disease)   . Rotator cuff rupture     Right; s/p repair  . BPH (benign prostatic hypertrophy)   . Abnormality of gait   . Unspecified hearing loss   . Impotence of organic origin   . Renal insufficiency     stones  . Arthritis   . HTN (hypertension)     EKG with clearance and ov Dr Dallas Schimke  on chart and EPIC 02/23/12,  CBC with diff, BMET and Hepatic panel EPIC ffrom PCP    Past Surgical History  Procedure Laterality Date  . Hemicoloectomy w/ anastomosis  1988    [name]  . Ventral hernia repair  [facility]  . Rotator cuff repair      Right; [facility]  . Vein ligation and  stripping      right  . Eye surgery      cataract extraction with IOL bilaterally  . Hernia repair  1963    inguinal  . Rotator cuff repair      right  . Total knee arthroplasty  03/05/2012    Procedure: TOTAL KNEE ARTHROPLASTY;  Surgeon: Loanne Drilling, MD;  Location: [facility];  Service: Orthopedics;  Laterality: Left;    History   Social History  . Marital Status: Married    Spouse Name: N/A    Number of Children: N/A  . Years of Education: N/A   Occupational History  . Not on file.   Social History Main Topics  . Smoking status: Never Smoker   . Smokeless tobacco: Never Used  . Alcohol Use: Yes     Comment: Wine (1/2-1 glass 2x/week  . Drug Use: No  . Sexually Active: Not on file   Other Topics Concern  . 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 Osteoarthritis Father     No Known Allergies  Medication list has been reviewed and updated.  Outpatient Prescriptions Prior to Visit  Medication Sig Dispense Refill  . amLODipine (NORVASC) 2.5 MG tablet Take 1 tablet (2.5 mg total) by mouth daily.  30 tablet  5  . lovastatin (MEVACOR) 20 MG tablet TAKE ONE TABLET BY MOUTH EVERY DAY  90 tablet  3  . zolpidem (AMBIEN) 10 MG tablet Take 1 tablet (10 mg total) by mouth at bedtime as needed. For sleep  30 tablet  3  . omeprazole (PRILOSEC) 20 MG capsule Take 1 capsule (20 mg total) by mouth daily.  30 capsule  3   No facility-administered medications prior to visit.    Review of Systems:   GEN: No acute illnesses, no fevers, chills. GI: No n/v/d, eating normally Pulm: No SOB Interactive and getting along well at home.  Otherwise, ROS is as per the HPI.   Physical  Examination: BP 118/70  Pulse 80  Temp(Src) 98.2 F (36.8 C) (Oral)  Wt 218 lb 4 oz (98.998 kg)  BMI 30.45 kg/m2  Ideal Body Weight:     GEN: WDWN, NAD, Non-toxic, Alert & Oriented x 3 HEENT: Atraumatic, Normocephalic.  Breast: L chest with palpable density more lateral and superior adjacent to nipple Ears and Nose: No external deformity. EXTR: No clubbing/cyanosis/edema NEURO: Normal gait.  PSYCH: Normally interactive. Conversant. Not depressed or anxious appearing.  Calm demeanor.    Assessment and Plan: Left breast mass - Plan: MM Digital Diagnostic Bilat, US Breast Left, CANCELED: MM Digital Diagnostic Bilat   diac mammo and [place] if needed  Signed, Ferrel Simington T. Derotha Fishbaugh, MD 01/23/2013 10:44 AM

## 2013-01-25 ENCOUNTER — Other Ambulatory Visit: Payer: Self-pay | Admitting: Family Medicine

## 2013-01-25 ENCOUNTER — Telehealth: Payer: Self-pay | Admitting: Family Medicine

## 2013-01-25 ENCOUNTER — Encounter: Payer: Self-pay | Admitting: Family Medicine

## 2013-01-25 DIAGNOSIS — N6320 Unspecified lump in the left breast, unspecified quadrant: Secondary | ICD-10-CM

## 2013-01-25 NOTE — Telephone Encounter (Signed)
I spoke with patient, discussed imaging findings.  Will refer to surgery. Pt prefers [facility]. Will route to PCP and [facility].

## 2013-01-28 NOTE — Telephone Encounter (Signed)
Called and discussed with wife. Upcoming appt with Dr. Margaree Mackintosh.   [name], MD 01/28/2013, 5:50 PM

## 2013-01-31 ENCOUNTER — Ambulatory Visit (INDEPENDENT_AMBULATORY_CARE_PROVIDER_SITE_OTHER): Payer: Medicare Other | Admitting: Surgery

## 2013-01-31 ENCOUNTER — Encounter (INDEPENDENT_AMBULATORY_CARE_PROVIDER_SITE_OTHER): Payer: Self-pay | Admitting: Surgery

## 2013-01-31 VITALS — BP 138/82 | HR 78 | Temp 98.6°F | Resp 14 | Ht 71.0 in | Wt 218.8 lb

## 2013-01-31 DIAGNOSIS — N6320 Unspecified lump in the left breast, unspecified quadrant: Secondary | ICD-10-CM

## 2013-01-31 DIAGNOSIS — N63 Unspecified lump in unspecified breast: Secondary | ICD-10-CM

## 2013-01-31 NOTE — Progress Notes (Signed)
Patient ID: Michael [name], male   DOB: 10/23/1931, 80 y.o.   MRN: [id number]  Chief Complaint  Patient presents with  . Mass    left breast    HPI Michael [name] is a 80 y.o. male.  Referred by Dr. Spencer Copland for evaluation of left breast mass HPI This is a 80 yo male with a remote history of colon cancer that presents with a recent onset of tender mass in the left breast.  He noticed this one day when he was washing himself.  It has not enlarged significantly since he first noticed this about four weeks ago.  He denies any symptoms on the right side. He was referred for a mammogram and ultrasound. The ultrasound was unremarkable. The mammogram showed an area of asymmetric density in the left retroareolar portion but the breast is almost entirely fatty replaced. There are no masses distinct enough for needle biopsy. Past Medical History  Diagnosis Date  . Colon cancer 1988  . Ventral hernia 1988  . HLD (hyperlipidemia)   . GERD (gastroesophageal reflux disease)   . Rotator cuff rupture     Right; s/p repair  . BPH (benign prostatic hypertrophy)   . Abnormality of gait   . Unspecified hearing loss   . Impotence of organic origin   . Renal insufficiency     stones  . Arthritis   . HTN (hypertension)     EKG with clearance and ov Dr Copeland  on chart and EPIC 02/23/12,  CBC with diff, BMET and Hepatic panel EPIC ffrom PCP    Past Surgical History  Procedure Laterality Date  . Hemicoloectomy w/ anastomosis  1988    [name]  . Ventral hernia repair  1988    [facility]  . Rotator cuff repair      Right; [facility]  . Vein ligation and stripping      right  . Eye surgery      cataract extraction with IOL bilaterally  . Hernia repair  1963    inguinal  . Rotator cuff repair      right  . Total knee arthroplasty  03/05/2012    Procedure: TOTAL KNEE ARTHROPLASTY;  Surgeon: Frank V Aluisio, MD;  Location: [facility];  Service: Orthopedics;  Laterality: Left;  . Colon  surgery      colon cancer    Family History  Problem Relation Age of Onset  . Osteoarthritis Father     Social History History  Substance Use Topics  . Smoking status: Never Smoker   . Smokeless tobacco: Never Used  . Alcohol Use: Yes     Comment: Wine (1/2-1 glass 2x/week    No Known Allergies  Current Outpatient Prescriptions  Medication Sig Dispense Refill  . amLODipine (NORVASC) 2.5 MG tablet Take 1 tablet (2.5 mg total) by mouth daily.  30 tablet  5  . HYDROcodone-acetaminophen (NORCO/VICODIN) 5-325 MG per tablet Take 1 tablet by mouth daily as needed for pain.      . lovastatin (MEVACOR) 20 MG tablet TAKE ONE TABLET BY MOUTH EVERY DAY  90 tablet  3  . methocarbamol (ROBAXIN) 500 MG tablet Take 500 mg by mouth daily as needed.      . omeprazole (PRILOSEC) 20 MG capsule Take 20 mg by mouth daily as needed.      . zolpidem (AMBIEN) 10 MG tablet Take 1 tablet (10 mg total) by mouth at bedtime as needed. For sleep  30 tablet  3     No current facility-administered medications for this visit.    Review of Systems Review of Systems  Constitutional: Negative for fever, chills and unexpected weight change.  HENT: Negative for hearing loss, congestion, sore throat, trouble swallowing and voice change.   Eyes: Negative for visual disturbance.  Respiratory: Negative for cough and wheezing.   Cardiovascular: Negative for chest pain, palpitations and leg swelling.  Gastrointestinal: Negative for nausea, vomiting, abdominal pain, diarrhea, constipation, blood in stool, abdominal distention, anal bleeding and rectal pain.  Genitourinary: Negative for hematuria and difficulty urinating.  Musculoskeletal: Negative for arthralgias.  Skin: Negative for rash and wound.  Neurological: Negative for seizures, syncope, weakness and headaches.  Hematological: Negative for adenopathy. Does not bruise/bleed easily.  Psychiatric/Behavioral: Negative for confusion.    Blood pressure 138/82,  pulse 78, temperature 98.6 F (37 C), temperature source Temporal, resp. rate 14, height 5' 11" (1.803 m), weight 218 lb 12.8 oz (99.247 kg).  Physical Exam Physical Exam WDWN in NAD HEENT:  EOMI, sclera anicteric Neck:  No masses, no thyromegaly Lungs:  CTA bilaterally; normal respiratory effort Chest:  No palpable masses on the right side. On the left side there is indistinct 2 - 3 centimeter area of firmness which is mildly tender. No overlying inflammation or infection. No palpable lymph nodes. CV:  Regular rate and rhythm; no murmurs Abd:  +bowel sounds, soft, non-tender, no masses Ext:  Well-perfused; no edema Skin:  Warm, dry; no sign of jaundice  Data Reviewed I reviewed the ultrasound and mammogram report from [facility].  Assessment    Tender palpable left breast mass not easily visualized on mammogram       Plan    Recommend complete excision of this area with a left subcutaneous mastectomy. We will submit this tissue for biopsy to rule out malignancy.  The surgical procedure has been discussed with the patient.  Potential risks, benefits, alternative treatments, and expected outcomes have been explained.  All of the patient's questions at this time have been answered.  The likelihood of reaching the patient's treatment goal is good.  The patient understand the proposed surgical procedure and wishes to proceed.         Skyra Crichlow K. 01/31/2013, 1:20 PM    

## 2013-02-05 ENCOUNTER — Encounter (HOSPITAL_BASED_OUTPATIENT_CLINIC_OR_DEPARTMENT_OTHER)
Admission: RE | Admit: 2013-02-05 | Discharge: 2013-02-05 | Disposition: A | Discharge status: Home / Self Care | Payer: Medicare Other | Source: Ambulatory Visit | Attending: Surgery | Admitting: Surgery

## 2013-02-05 ENCOUNTER — Ambulatory Visit (INDEPENDENT_AMBULATORY_CARE_PROVIDER_SITE_OTHER): Payer: Self-pay | Admitting: Surgery

## 2013-02-05 ENCOUNTER — Encounter (HOSPITAL_BASED_OUTPATIENT_CLINIC_OR_DEPARTMENT_OTHER): Payer: Self-pay | Admitting: *Deleted

## 2013-02-05 LAB — BASIC METABOLIC PANEL
CO2: 24 mEq/L (ref 19–32)
Calcium: 9.4 mg/dL (ref 8.4–10.5)
Creatinine, Ser: 1.34 mg/dL (ref 0.50–1.35)
GFR calc non Af Amer: 49 mL/min — ABNORMAL LOW (ref >90)

## 2013-02-05 NOTE — Progress Notes (Signed)
To come in for bmet-ekg 6/13  Last surgery total knee 6/13-did well

## 2013-02-06 MED ORDER — BSS IO SOLN
INTRAOCULAR | Status: AC
  Filled 2013-02-06: qty 15

## 2013-02-06 MED ORDER — TOBRAMYCIN-DEXAMETHASONE 0.3-0.1 % OP OINT
TOPICAL_OINTMENT | OPHTHALMIC | Status: AC
  Filled 2013-02-06: qty 3.5

## 2013-02-06 MED ORDER — TETRACAINE HCL 0.5 % OP SOLN
OPHTHALMIC | Status: AC
  Filled 2013-02-06: qty 2

## 2013-02-06 MED ORDER — LIDOCAINE-EPINEPHRINE 2 %-1:100000 IJ SOLN
INTRAMUSCULAR | Status: AC
  Filled 2013-02-06: qty 1

## 2013-02-06 MED ORDER — BSS IO SOLN
INTRAOCULAR | Status: AC
  Filled 2013-02-06: qty 500

## 2013-02-06 MED ORDER — NA CHONDROIT SULF-NA HYALURON 40-30 MG/ML IO SOLN
INTRAOCULAR | Status: AC
  Filled 2013-02-06: qty 0.5

## 2013-02-06 MED ORDER — ACETYLCHOLINE CHLORIDE 1:100 IO SOLR
INTRAOCULAR | Status: AC
  Filled 2013-02-06: qty 1

## 2013-02-06 MED ORDER — EPINEPHRINE HCL 1 MG/ML IJ SOLN
INTRAMUSCULAR | Status: AC
  Filled 2013-02-06: qty 1

## 2013-02-06 MED ORDER — PILOCARPINE HCL 4 % OP SOLN
OPHTHALMIC | Status: AC
  Filled 2013-02-06: qty 15

## 2013-02-06 MED ORDER — BUPIVACAINE HCL (PF) 0.75 % IJ SOLN
INTRAMUSCULAR | Status: AC
  Filled 2013-02-06: qty 10

## 2013-02-06 MED ORDER — GENTAMICIN SULFATE 40 MG/ML IJ SOLN
INTRAMUSCULAR | Status: AC
Start: 2013-02-06 — End: 2013-02-06
  Filled 2013-02-06: qty 2

## 2013-02-06 MED ORDER — DEXAMETHASONE SODIUM PHOSPHATE 10 MG/ML IJ SOLN
INTRAMUSCULAR | Status: AC
  Filled 2013-02-06: qty 1

## 2013-02-06 MED ORDER — SODIUM HYALURONATE 10 MG/ML IO SOLN
INTRAOCULAR | Status: AC
  Filled 2013-02-06: qty 0.85

## 2013-02-06 MED ORDER — LIDOCAINE HCL 2 % IJ SOLN
INTRAMUSCULAR | Status: AC
  Filled 2013-02-06: qty 20

## 2013-02-07 ENCOUNTER — Encounter (HOSPITAL_BASED_OUTPATIENT_CLINIC_OR_DEPARTMENT_OTHER): Payer: Self-pay | Admitting: Anesthesiology

## 2013-02-07 ENCOUNTER — Ambulatory Visit (HOSPITAL_BASED_OUTPATIENT_CLINIC_OR_DEPARTMENT_OTHER): Payer: Medicare Other | Admitting: Anesthesiology

## 2013-02-07 ENCOUNTER — Encounter (HOSPITAL_BASED_OUTPATIENT_CLINIC_OR_DEPARTMENT_OTHER)
Admission: RE | Disposition: A | Discharge status: Home / Self Care | Payer: Self-pay | Source: Ambulatory Visit | Attending: Surgery

## 2013-02-07 ENCOUNTER — Ambulatory Visit (HOSPITAL_BASED_OUTPATIENT_CLINIC_OR_DEPARTMENT_OTHER)
Admission: RE | Admit: 2013-02-07 | Discharge: 2013-02-07 | Disposition: A | Discharge status: Home / Self Care | Payer: Medicare Other | Source: Ambulatory Visit | Attending: Surgery | Admitting: Surgery

## 2013-02-07 DIAGNOSIS — Z79899 Other long term (current) drug therapy: Secondary | ICD-10-CM | POA: Insufficient documentation

## 2013-02-07 DIAGNOSIS — I10 Essential (primary) hypertension: Secondary | ICD-10-CM | POA: Insufficient documentation

## 2013-02-07 DIAGNOSIS — Z85038 Personal history of other malignant neoplasm of large intestine: Secondary | ICD-10-CM | POA: Insufficient documentation

## 2013-02-07 DIAGNOSIS — H9190 Unspecified hearing loss, unspecified ear: Secondary | ICD-10-CM | POA: Insufficient documentation

## 2013-02-07 DIAGNOSIS — Z87442 Personal history of urinary calculi: Secondary | ICD-10-CM | POA: Insufficient documentation

## 2013-02-07 DIAGNOSIS — N400 Enlarged prostate without lower urinary tract symptoms: Secondary | ICD-10-CM | POA: Insufficient documentation

## 2013-02-07 DIAGNOSIS — N6320 Unspecified lump in the left breast, unspecified quadrant: Secondary | ICD-10-CM

## 2013-02-07 DIAGNOSIS — N62 Hypertrophy of breast: Secondary | ICD-10-CM | POA: Insufficient documentation

## 2013-02-07 DIAGNOSIS — K219 Gastro-esophageal reflux disease without esophagitis: Secondary | ICD-10-CM | POA: Insufficient documentation

## 2013-02-07 DIAGNOSIS — M129 Arthropathy, unspecified: Secondary | ICD-10-CM | POA: Insufficient documentation

## 2013-02-07 HISTORY — PX: MASTECTOMY, PARTIAL: SHX709

## 2013-02-07 LAB — POCT HEMOGLOBIN-HEMACUE: Hemoglobin: 15.1 g/dL (ref 13.0–17.0)

## 2013-02-07 SURGERY — MASTECTOMY PARTIAL
Anesthesia: General | Site: Breast | Laterality: Left | Wound class: Clean

## 2013-02-07 MED ORDER — LIDOCAINE HCL (CARDIAC) 20 MG/ML IV SOLN
INTRAVENOUS | Status: DC | PRN
  Administered 2013-02-07: 60 mg via INTRAVENOUS

## 2013-02-07 MED ORDER — OXYCODONE-ACETAMINOPHEN 5-325 MG PO TABS: 1.0000 | ORAL_TABLET | ORAL | Status: DC | PRN

## 2013-02-07 MED ORDER — CHLORHEXIDINE GLUCONATE 4 % EX LIQD: 1.0000 "application " | Freq: Once | CUTANEOUS | Status: DC

## 2013-02-07 MED ORDER — PHENYLEPHRINE HCL 10 MG/ML IJ SOLN
INTRAMUSCULAR | Status: DC | PRN
  Administered 2013-02-07: 40 ug via INTRAVENOUS
  Administered 2013-02-07: 80 ug via INTRAVENOUS

## 2013-02-07 MED ORDER — GLYCOPYRROLATE 0.2 MG/ML IJ SOLN
INTRAMUSCULAR | Status: DC | PRN
  Administered 2013-02-07: 0.2 mg via INTRAVENOUS

## 2013-02-07 MED ORDER — FENTANYL CITRATE 0.05 MG/ML IJ SOLN
50.0000 ug | INTRAMUSCULAR | Status: DC | PRN
  Administered 2013-02-07 (×2): 25 ug via INTRAVENOUS

## 2013-02-07 MED ORDER — OXYCODONE HCL 5 MG/5ML PO SOLN: 5.0000 mg | Freq: Once | ORAL | Status: DC | PRN

## 2013-02-07 MED ORDER — MIDAZOLAM HCL 2 MG/2ML IJ SOLN: 1.0000 mg | INTRAMUSCULAR | Status: DC | PRN

## 2013-02-07 MED ORDER — OXYCODONE HCL 5 MG PO TABS: 5.0000 mg | ORAL_TABLET | Freq: Once | ORAL | Status: DC | PRN

## 2013-02-07 MED ORDER — ONDANSETRON HCL 4 MG/2ML IJ SOLN
INTRAMUSCULAR | Status: DC | PRN
  Administered 2013-02-07: 4 mg via INTRAVENOUS

## 2013-02-07 MED ORDER — CEFAZOLIN SODIUM-DEXTROSE 2-3 GM-% IV SOLR
2.0000 g | INTRAVENOUS | Status: AC
  Administered 2013-02-07: 2 g via INTRAVENOUS

## 2013-02-07 MED ORDER — PROPOFOL 10 MG/ML IV BOLUS
INTRAVENOUS | Status: DC | PRN
  Administered 2013-02-07: 150 mg via INTRAVENOUS

## 2013-02-07 MED ORDER — ONDANSETRON HCL 4 MG/2ML IJ SOLN: 4.0000 mg | INTRAMUSCULAR | Status: DC | PRN

## 2013-02-07 MED ORDER — DEXAMETHASONE SODIUM PHOSPHATE 4 MG/ML IJ SOLN
INTRAMUSCULAR | Status: DC | PRN
  Administered 2013-02-07: 10 mg via INTRAVENOUS

## 2013-02-07 MED ORDER — LACTATED RINGERS IV SOLN
INTRAVENOUS | Status: DC
  Administered 2013-02-07 (×2): via INTRAVENOUS

## 2013-02-07 MED ORDER — MORPHINE SULFATE 2 MG/ML IJ SOLN: 2.0000 mg | INTRAMUSCULAR | Status: DC | PRN

## 2013-02-07 MED ORDER — HYDROMORPHONE HCL PF 1 MG/ML IJ SOLN: 0.2500 mg | INTRAMUSCULAR | Status: DC | PRN

## 2013-02-07 MED ORDER — BUPIVACAINE-EPINEPHRINE 0.25% -1:200000 IJ SOLN
INTRAMUSCULAR | Status: DC | PRN
  Administered 2013-02-07: 20 mL

## 2013-02-07 SURGICAL SUPPLY — 45 items
APL SKNCLS STERI-STRIP NONHPOA (GAUZE/BANDAGES/DRESSINGS) ×1
APPLICATOR COTTON TIP 6IN STRL (MISCELLANEOUS) IMPLANT
BENZOIN TINCTURE PRP APPL 2/3 (GAUZE/BANDAGES/DRESSINGS) ×2 IMPLANT
BLADE HEX COATED 2.75 (ELECTRODE) ×2 IMPLANT
BLADE SURG 15 STRL LF DISP TIS (BLADE) ×1 IMPLANT
BLADE SURG 15 STRL SS (BLADE) ×2
CANISTER SUCTION 1200CC (MISCELLANEOUS) IMPLANT
CHLORAPREP W/TINT 26ML (MISCELLANEOUS) ×2 IMPLANT
CLOTH BEACON ORANGE TIMEOUT ST (SAFETY) ×2 IMPLANT
COVER MAYO STAND STRL (DRAPES) ×2 IMPLANT
COVER TABLE BACK 60X90 (DRAPES) ×2 IMPLANT
DECANTER SPIKE VIAL GLASS SM (MISCELLANEOUS) ×1 IMPLANT
DEVICE DUBIN W/COMP PLATE 8390 (MISCELLANEOUS) ×1 IMPLANT
DRAPE PED LAPAROTOMY (DRAPES) ×2 IMPLANT
DRAPE UTILITY XL STRL (DRAPES) ×2 IMPLANT
DRSG TEGADERM 4X4.75 (GAUZE/BANDAGES/DRESSINGS) ×2 IMPLANT
ELECT BLADE 4.0 EZ CLEAN MEGAD (MISCELLANEOUS) ×2
ELECT REM PT RETURN 9FT ADLT (ELECTROSURGICAL) ×2
ELECTRODE BLDE 4.0 EZ CLN MEGD (MISCELLANEOUS) IMPLANT
ELECTRODE REM PT RTRN 9FT ADLT (ELECTROSURGICAL) ×1 IMPLANT
GAUZE SPONGE 4X4 12PLY STRL LF (GAUZE/BANDAGES/DRESSINGS) ×1 IMPLANT
GLOVE BIO SURGEON STRL SZ7 (GLOVE) ×2 IMPLANT
GLOVE BIOGEL PI IND STRL 7.5 (GLOVE) ×1 IMPLANT
GLOVE BIOGEL PI INDICATOR 7.5 (GLOVE) ×2
GLOVE EXAM NITRILE MD LF STRL (GLOVE) ×1 IMPLANT
GLOVE SURG SS PI 7.5 STRL IVOR (GLOVE) ×1 IMPLANT
GOWN PREVENTION PLUS XLARGE (GOWN DISPOSABLE) ×3 IMPLANT
NDL HYPO 25X1 1.5 SAFETY (NEEDLE) ×1 IMPLANT
NEEDLE HYPO 25X1 1.5 SAFETY (NEEDLE) ×2 IMPLANT
NS IRRIG 1000ML POUR BTL (IV SOLUTION) IMPLANT
PACK BASIN DAY SURGERY FS (CUSTOM PROCEDURE TRAY) ×2 IMPLANT
PENCIL BUTTON HOLSTER BLD 10FT (ELECTRODE) ×2 IMPLANT
SLEEVE SCD COMPRESS KNEE MED (MISCELLANEOUS) ×2 IMPLANT
SPONGE LAP 4X18 X RAY DECT (DISPOSABLE) ×2 IMPLANT
STRIP CLOSURE SKIN 1/2X4 (GAUZE/BANDAGES/DRESSINGS) ×2 IMPLANT
SUT CHROMIC 3 0 SH 27 (SUTURE) IMPLANT
SUT MON AB 4-0 PC3 18 (SUTURE) ×2 IMPLANT
SUT SILK 2 0 SH (SUTURE) ×1 IMPLANT
SUT VIC AB 3-0 SH 27 (SUTURE)
SUT VIC AB 3-0 SH 27X BRD (SUTURE) ×1 IMPLANT
SYR CONTROL 10ML LL (SYRINGE) ×2 IMPLANT
TOWEL OR 17X24 6PK STRL BLUE (TOWEL DISPOSABLE) ×3 IMPLANT
TOWEL OR NON WOVEN STRL DISP B (DISPOSABLE) ×2 IMPLANT
TUBE CONNECTING 20X1/4 (TUBING) IMPLANT
YANKAUER SUCT BULB TIP NO VENT (SUCTIONS) IMPLANT

## 2013-02-07 NOTE — Op Note (Signed)
Preop diagnosis:Tender left breast mass Postop diagnosis: Same Procedure performed: Left subcutaneous mastectomy Surgeon:Tamecka Milham K. Anesthesia: Gen. Via LMA Indications: This is a 79 year old male who presents with recent onset of a tender palpable masses left breast. Ultrasound was unremarkable. The mammogram showed an area of asymmetric density in the left retroareolar portion but they could not visualize a distinct mass for needle biopsy. We examined him in the office and recommended subcutaneous mastectomy of the retroareolar breast tissue for diagnosis.  Description of procedure: The patient was brought to the operating room and placed in a supine position on the operating room table. After an adequate level of general anesthesia was obtained his left chest was prepped with ChloraPrep and draped in sterile fashion.We infiltrated the area around the left nipple with a total of 20 mL's of 0.25% Marcaine with epinephrine. A transverse incision was made below his nipple. Dissection was carried down through the dermis to the breast tissue. We raised a subcutaneous flap to the upper edge of the palpable breast tissue. We then dissected the breast tissue off was pectoralis muscle beginning at the inframammary crease up to the superior border of his breast tissue. We oriented the mastectomy specimen with a long suture lateral and a short suture superior. We inspected carefully for hemostasis. The wound was closed with a deep layer of 3-0 Vicryl and a subcuticular layer of 4-0 Monocryl. Steri-Strips and clean dressings were applied. The patient was then extubated and brought to recovery in stable condition. All sponge, initially, and needle counts are correct.  Michael [name]. Michael Skains, MD, [facility]  General/ Trauma Surgery  02/07/2013 3:33 PM

## 2013-02-07 NOTE — Anesthesia Postprocedure Evaluation (Signed)
  Anesthesia Post-op Note  Patient: Michael [name]  Procedure(s) Performed: Procedure(s): LEFT SUBCUTANEOUS MASTECTOMY (Left)  Patient Location: PACU  Anesthesia Type:General  Level of Consciousness: awake, alert  and oriented  Airway and Oxygen Therapy: Patient Spontanous Breathing and Patient connected to face mask oxygen  Post-op Pain: mild  Post-op Assessment: Post-op Vital signs reviewed, Patient's Cardiovascular Status Stable, Respiratory Function Stable, Patent Airway and No signs of Nausea or vomiting  Post-op Vital Signs: Reviewed and stable  Complications: No apparent anesthesia complications

## 2013-02-07 NOTE — Anesthesia Preprocedure Evaluation (Signed)
Anesthesia Evaluation  Patient identified by MRN, date of birth, ID band Patient awake    Reviewed: Allergy & Precautions, H&P , NPO status , Patient's Chart, lab work & pertinent test results  Airway Mallampati: I TM Distance: >3 FB Neck ROM: full    Dental no notable dental hx. (+) Teeth Intact and Dental Advidsory Given   Pulmonary neg pulmonary ROS,  breath sounds clear to auscultation  Pulmonary exam normal       Cardiovascular hypertension, On Medications Rhythm:regular Rate:Normal     Neuro/Psych PSYCHIATRIC DISORDERS negative neurological ROS     GI/Hepatic Neg liver ROS, GERD-  Medicated and Controlled,  Endo/Other  negative endocrine ROS  Renal/GU Renal disease  negative genitourinary   Musculoskeletal   Abdominal   Peds  Hematology negative hematology ROS (+)   Anesthesia Other Findings   Reproductive/Obstetrics negative OB ROS                           Anesthesia Physical Anesthesia Plan  ASA: II  Anesthesia Plan: General   Post-op Pain Management:    Induction: Intravenous  Airway Management Planned: LMA  Additional Equipment:   Intra-op Plan:   Post-operative Plan: Extubation in OR  Informed Consent: I have reviewed the patients History and Physical, chart, labs and discussed the procedure including the risks, benefits and alternatives for the proposed anesthesia with the patient or authorized representative who has indicated his/her understanding and acceptance.   Dental advisory given  Plan Discussed with: CRNA  Anesthesia Plan Comments:         Anesthesia Quick Evaluation

## 2013-02-07 NOTE — Interval H&P Note (Signed)
History and Physical Interval Note:  02/07/2013 1:31 PM  Michael [name]  has presented today for surgery, with the diagnosis of left breast mass  The various methods of treatment have been discussed with the patient and family. After consideration of risks, benefits and other options for treatment, the patient has consented to  Procedure(s): LEFT SUBCUTANEOUS MASTECTOMY (Left) as a surgical intervention .  The patient's history has been reviewed, patient examined, no change in status, stable for surgery.  I have reviewed the patient's chart and labs.  Questions were answered to the patient's satisfaction.     [name] K.

## 2013-02-07 NOTE — H&P (View-Only) (Signed)
Patient ID: Michael [name], male   DOB: 1931/11/17, 79 y.o.   MRN: [id number]  Chief Complaint  Patient presents with  . Mass    left breast    HPI Michael [name] is a 79 y.o. male.  Referred by Dr. Karleen Hampshire Copland for evaluation of left breast mass HPI This is a 79 yo male with a remote history of colon cancer that presents with a recent onset of tender mass in the left breast.  He noticed this one day when he was washing himself.  It has not enlarged significantly since he first noticed this about four weeks ago.  He denies any symptoms on the right side. He was referred for a mammogram and ultrasound. The ultrasound was unremarkable. The mammogram showed an area of asymmetric density in the left retroareolar portion but the breast is almost entirely fatty replaced. There are no masses distinct enough for needle biopsy. Past Medical History  Diagnosis Date  . Colon cancer 1988  . Ventral hernia 1988  . HLD (hyperlipidemia)   . GERD (gastroesophageal reflux disease)   . Rotator cuff rupture     Right; s/p repair  . BPH (benign prostatic hypertrophy)   . Abnormality of gait   . Unspecified hearing loss   . Impotence of organic origin   . Renal insufficiency     stones  . Arthritis   . HTN (hypertension)     EKG with clearance and ov Dr Michael [name]  on chart and EPIC 02/23/12,  CBC with diff, BMET and Hepatic panel EPIC ffrom PCP    Past Surgical History  Procedure Laterality Date  . Hemicoloectomy w/ anastomosis  1988    Michael [name]  . Ventral hernia repair  [facility]  . Rotator cuff repair      Right; [facility]  . Vein ligation and stripping      right  . Eye surgery      cataract extraction with IOL bilaterally  . Hernia repair  1963    inguinal  . Rotator cuff repair      right  . Total knee arthroplasty  03/05/2012    Procedure: TOTAL KNEE ARTHROPLASTY;  Surgeon: Michael Drilling, MD;  Location: [facility];  Service: Orthopedics;  Laterality: Left;  . Colon  surgery      colon cancer    Family History  Problem Relation Age of Onset  . Osteoarthritis Father     Social History History  Substance Use Topics  . Smoking status: Never Smoker   . Smokeless tobacco: Never Used  . Alcohol Use: Yes     Comment: Wine (1/2-1 glass 2x/week    No Known Allergies  Current Outpatient Prescriptions  Medication Sig Dispense Refill  . amLODipine (NORVASC) 2.5 MG tablet Take 1 tablet (2.5 mg total) by mouth daily.  30 tablet  5  . HYDROcodone-acetaminophen (NORCO/VICODIN) 5-325 MG per tablet Take 1 tablet by mouth daily as needed for pain.      Marland Kitchen lovastatin (MEVACOR) 20 MG tablet TAKE ONE TABLET BY MOUTH EVERY DAY  90 tablet  3  . methocarbamol (ROBAXIN) 500 MG tablet Take 500 mg by mouth daily as needed.      Marland Kitchen omeprazole (PRILOSEC) 20 MG capsule Take 20 mg by mouth daily as needed.      . zolpidem (AMBIEN) 10 MG tablet Take 1 tablet (10 mg total) by mouth at bedtime as needed. For sleep  30 tablet  3  No current facility-administered medications for this visit.    Review of Systems Review of Systems  Constitutional: Negative for fever, chills and unexpected weight change.  HENT: Negative for hearing loss, congestion, sore throat, trouble swallowing and voice change.   Eyes: Negative for visual disturbance.  Respiratory: Negative for cough and wheezing.   Cardiovascular: Negative for chest pain, palpitations and leg swelling.  Gastrointestinal: Negative for nausea, vomiting, abdominal pain, diarrhea, constipation, blood in stool, abdominal distention, anal bleeding and rectal pain.  Genitourinary: Negative for hematuria and difficulty urinating.  Musculoskeletal: Negative for arthralgias.  Skin: Negative for rash and wound.  Neurological: Negative for seizures, syncope, weakness and headaches.  Hematological: Negative for adenopathy. Does not bruise/bleed easily.  Psychiatric/Behavioral: Negative for confusion.    Blood pressure 138/82,  pulse 78, temperature 98.6 F (37 C), temperature source Temporal, resp. rate 14, height 5\' 11"  (1.803 m), weight 218 lb 12.8 oz (99.247 kg).  Physical Exam Physical Exam WDWN in NAD HEENT:  EOMI, sclera anicteric Neck:  No masses, no thyromegaly Lungs:  CTA bilaterally; normal respiratory effort Chest:  No palpable masses on the right side. On the left side there is indistinct 2 - 3 centimeter area of firmness which is mildly tender. No overlying inflammation or infection. No palpable lymph nodes. CV:  Regular rate and rhythm; no murmurs Abd:  +bowel sounds, soft, non-tender, no masses Ext:  Well-perfused; no edema Skin:  Warm, dry; no sign of jaundice  Data Reviewed I reviewed the ultrasound and mammogram report from [facility].  Assessment    Tender palpable left breast mass not easily visualized on mammogram       Plan    Recommend complete excision of this area with a left subcutaneous mastectomy. We will submit this tissue for biopsy to rule out malignancy.  The surgical procedure has been discussed with the patient.  Potential risks, benefits, alternative treatments, and expected outcomes have been explained.  All of the patient's questions at this time have been answered.  The likelihood of reaching the patient's treatment goal is good.  The patient understand the proposed surgical procedure and wishes to proceed.         Michael Will K. 01/31/2013, 1:20 PM

## 2013-02-07 NOTE — Transfer of Care (Signed)
Immediate Anesthesia Transfer of Care Note  Patient: Michael [name]  Procedure(s) Performed: Procedure(s): LEFT SUBCUTANEOUS MASTECTOMY (Left)  Patient Location: PACU  Anesthesia Type:General  Level of Consciousness: awake and alert   Airway & Oxygen Therapy: Patient Spontanous Breathing and Patient connected to face mask oxygen  Post-op Assessment: Report given to PACU RN  Post vital signs: Reviewed and stable  Complications: No apparent anesthesia complications

## 2013-02-08 ENCOUNTER — Encounter (HOSPITAL_BASED_OUTPATIENT_CLINIC_OR_DEPARTMENT_OTHER): Payer: Self-pay | Admitting: Surgery

## 2013-02-12 ENCOUNTER — Telehealth (INDEPENDENT_AMBULATORY_CARE_PROVIDER_SITE_OTHER): Payer: Self-pay

## 2013-02-12 NOTE — Telephone Encounter (Signed)
Patient spouse calling in for pathology results from recent Subcutaneous Mastectomy on 02/07/13.  Reviewed pathology and Benign results were given to [name] (spouse).  Post op appointment given at time of call for 02/20/13 @ 2:10 pm w/Dr. Corliss Skains.

## 2013-02-13 ENCOUNTER — Telehealth (INDEPENDENT_AMBULATORY_CARE_PROVIDER_SITE_OTHER): Payer: Self-pay | Admitting: General Surgery

## 2013-02-13 ENCOUNTER — Other Ambulatory Visit: Payer: Self-pay | Admitting: *Deleted

## 2013-02-13 ENCOUNTER — Encounter (INDEPENDENT_AMBULATORY_CARE_PROVIDER_SITE_OTHER): Payer: Self-pay

## 2013-02-13 MED ORDER — OMEPRAZOLE 20 MG PO CPDR: 20.0000 mg | DELAYED_RELEASE_CAPSULE | Freq: Every day | ORAL | Status: DC | PRN

## 2013-02-13 NOTE — Telephone Encounter (Signed)
Message copied by Wilder Glade on Wed Feb 13, 2013  8:43 AM ------      Message from: Wynona Luna      Created: Tue Feb 12, 2013  7:55 PM       Forwarded path report.  Pattricia Boss, please let him know that it was benign and showed no sign of breast cancer ------

## 2013-02-13 NOTE — Telephone Encounter (Signed)
Called patient and told him about his path report. And remind the patient of his upcoming apt with Dr. Corliss Skains on February 20, 2013

## 2013-02-20 ENCOUNTER — Encounter (INDEPENDENT_AMBULATORY_CARE_PROVIDER_SITE_OTHER): Payer: Self-pay | Admitting: Surgery

## 2013-02-20 ENCOUNTER — Ambulatory Visit (INDEPENDENT_AMBULATORY_CARE_PROVIDER_SITE_OTHER): Payer: Medicare Other | Admitting: Surgery

## 2013-02-20 VITALS — BP 136/77 | HR 77 | Temp 98.9°F | Resp 16 | Ht 71.5 in | Wt 222.6 lb

## 2013-02-20 DIAGNOSIS — IMO0002 Reserved for concepts with insufficient information to code with codable children: Secondary | ICD-10-CM

## 2013-02-20 DIAGNOSIS — N62 Hypertrophy of breast: Secondary | ICD-10-CM | POA: Insufficient documentation

## 2013-02-20 NOTE — Progress Notes (Signed)
Status post left subcutaneous mastectomy on 02/07/13. The pathology showed benign gynecomastia with PASH.  The patient is doing well. He reports a heavy swollen feeling in the left chest.  Filed Vitals:   02/20/13 1419  BP: 136/77  Pulse: 77  Temp: 98.9 F (37.2 C)  Resp: 16   His incision is well healed with no sign of infection. He has accumulated a seroma in the mastectomy cavity. We prepped the skin with alcohol and anesthetized with 1% lidocaine. I inserted an 18-gauge needle and aspirated 60 cc of bloody thin fluid. There was resolution of the seroma cavity. A dry dressing was placed. The patient will call us back if the seroma reaccumulates.  Otherwise, follow-up PRN.  Wilmon Arms. Corliss Skains, MD, [facility]  General/ Trauma Surgery  02/20/2013 6:11 PM

## 2013-02-22 ENCOUNTER — Encounter (INDEPENDENT_AMBULATORY_CARE_PROVIDER_SITE_OTHER): Payer: Medicare Other | Admitting: Surgery

## 2013-02-25 ENCOUNTER — Encounter: Payer: Self-pay | Admitting: Family Medicine

## 2013-02-25 ENCOUNTER — Ambulatory Visit (INDEPENDENT_AMBULATORY_CARE_PROVIDER_SITE_OTHER): Payer: Medicare Other | Admitting: Family Medicine

## 2013-02-25 VITALS — BP 130/84 | HR 82 | Temp 98.3°F | Ht 71.5 in | Wt 218.0 lb

## 2013-02-25 DIAGNOSIS — Z79899 Other long term (current) drug therapy: Secondary | ICD-10-CM

## 2013-02-25 DIAGNOSIS — N401 Enlarged prostate with lower urinary tract symptoms: Secondary | ICD-10-CM

## 2013-02-25 DIAGNOSIS — Z23 Encounter for immunization: Secondary | ICD-10-CM

## 2013-02-25 DIAGNOSIS — E785 Hyperlipidemia, unspecified: Secondary | ICD-10-CM

## 2013-02-25 DIAGNOSIS — Z0000 Encounter for general adult medical examination without abnormal findings: Secondary | ICD-10-CM

## 2013-02-25 DIAGNOSIS — I10 Essential (primary) hypertension: Secondary | ICD-10-CM

## 2013-02-25 NOTE — Progress Notes (Signed)
[facility] [address] [place] [address] Phone: [phone] Fax: [phone]  Date:  02/25/2013   Name:  Michael [name]   DOB:  Jul 12, 1932   MRN:  [id number] Gender: male Age: 79 y.o.  Primary Physician:  [name], MD  Evaluating MD: [name], MD   Chief Complaint: Annual Exam   History of Present Illness:  Michael [name] is a 79 y.o. pleasant patient who presents with the following:  Medicare CPX and f/u multiple medical problems.  Passes on shots for right now.  Pneumovax - he will get this one.  Wants to get a PSA.  Preventative Health Maintenance Visit:  Health Maintenance Summary Reviewed and updated, unless pt declines services.  Tobacco History Reviewed. Alcohol: No concerns, no excessive use Exercise Habits: Some activity, rec at least 30 mins 5 times a week - limited some now by recent breast surgery and knee STD concerns: no risk or activity to increase risk Drug Use: None Encouraged self-testicular check  Health Maintenance  Topic Date Due  . Tetanus/tdap  03/26/1953  . Zostavax  03/26/1994  . Influenza Vaccine  05/13/2013  . Colonoscopy  01/08/2014  . Pneumococcal Polysaccharide Vaccine Age 25 And Over  Completed    Labs reviewed with the patient.  Results for orders placed during the hospital encounter of 02/07/13  BASIC METABOLIC PANEL      Result Value Range   Sodium 139  135 - 145 mEq/L   Potassium 4.3  3.5 - 5.1 mEq/L   Chloride 101  96 - 112 mEq/L   CO2 24  19 - 32 mEq/L   Glucose, Bld 84  70 - 99 mg/dL   BUN 31 (*) 6 - 23 mg/dL   Creatinine, Ser 8.65  0.50 - 1.35 mg/dL   Calcium 9.4  8.4 - 78.4 mg/dL   GFR calc non Af Amer 49 (*) >90 mL/min   GFR calc Af Amer 57 (*) >90 mL/min  POCT HEMOGLOBIN-HEMACUE      Result Value Range   Hemoglobin 15.1  13.0 - 17.0 g/dL   HTN: Tolerating all medications without side effects Stable and at goal No CP, no sob. No HA.  BP Readings from Last 3  Encounters:  02/25/13 130/84  02/20/13 136/77  02/07/13 143/89    Basic Metabolic Panel:    Component Value Date/Time   NA 139 02/05/2013 1500   K 4.3 02/05/2013 1500   CL 101 02/05/2013 1500   CO2 24 02/05/2013 1500   BUN 31* 02/05/2013 1500   CREATININE 1.34 02/05/2013 1500   GLUCOSE 84 02/05/2013 1500   CALCIUM 9.4 02/05/2013 1500     Lipids: Doing well, stable. Tolerating meds fine with no SE. Labs pending   h/o colon neoplasm, repeat due in 2015  BPH, some intermittent problems with stream  Intermittent ED. Cialis helps.  Patient Active Problem List   Diagnosis Date Noted  . Gynecomastia, male 02/20/2013  . Seroma complicating a procedure 02/20/2013  . Left breast mass 01/25/2013  . Orthostatic hypotension 05/10/2012  . OA (osteoarthritis) of knee 03/05/2012  . HYPERTENSION 01/08/2010  . BENIGN PROSTATIC HYPERTROPHY, WITH URINARY OBSTRUCTION 01/07/2010  . HEARING LOSS, UNSPEC. 12/08/2008  . GAIT DISTURBANCE 12/08/2008  . HYPERLIPIDEMIA 07/23/2008  . GERD 07/23/2008  . CKD (chronic kidney disease) stage 3, GFR 30-59 ml/min 07/23/2008  . 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  Colon cancer 1988  . Ventral hernia 1988  . HLD (hyperlipidemia)   . GERD (gastroesophageal reflux disease)   . Rotator cuff rupture     Right; s/p repair  . BPH (benign prostatic hypertrophy)   . Abnormality of gait   . Unspecified hearing loss   . Impotence of organic origin   . HTN (hypertension)     EKG with clearance and ov Dr Dallas Schimke  on chart and EPIC 02/23/12,  CBC with diff, BMET and Hepatic panel EPIC ffrom PCP  . Renal insufficiency     stones  . Arthritis   . Wears glasses   . Wears hearing aid     both ears  . Back pain     Past Surgical History  Procedure Laterality Date  . Hemicoloectomy w/ anastomosis  1988    [name]  . Ventral hernia repair  [facility]  . Rotator cuff  repair      Right; [facility]  . Vein ligation and stripping      right  . Eye surgery      cataract extraction with IOL bilaterally  . Hernia repair  1963    inguinal  . Rotator cuff repair      right  . Total knee arthroplasty  03/05/2012    Procedure: TOTAL KNEE ARTHROPLASTY;  Surgeon: Loanne Drilling, MD;  Location: [facility];  Service: Orthopedics;  Laterality: Left;  . Colon surgery      colon cancer  . Mastectomy, partial Left 02/07/2013    Procedure: LEFT SUBCUTANEOUS MASTECTOMY;  Surgeon: Wilmon Arms. Corliss Skains, MD;  Location: [facility];  Service: General;  Laterality: Left;    History   Social History  . Marital Status: Married    Spouse Name: N/A    Number of Children: N/A  . Years of Education: N/A   Occupational History  . Not on file.   Social History Main Topics  . Smoking status: Never Smoker   . Smokeless tobacco: Never Used  . Alcohol Use: Yes     Comment: Wine (1/2-1 glass 2x/week  . Drug Use: No  . Sexually Active: Not on file   Other Topics Concern  . 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 Osteoarthritis Father     No Known Allergies  Medication list has been reviewed and updated.  Outpatient Prescriptions Prior to Visit  Medication Sig Dispense Refill  . HYDROcodone-acetaminophen (NORCO/VICODIN) 5-325 MG per tablet Take 1 tablet by mouth daily as needed for pain.      Marland Kitchen lovastatin (MEVACOR) 20 MG tablet TAKE ONE TABLET BY MOUTH EVERY DAY  90 tablet  3  . methocarbamol (ROBAXIN) 500 MG tablet Take 500 mg by mouth daily as needed.      Marland Kitchen omeprazole (PRILOSEC) 20 MG capsule Take 1 capsule (20 mg total) by mouth daily as needed.  90 capsule  1  .  oxyCODONE-acetaminophen (PERCOCET/ROXICET) 5-325 MG per tablet Take 1 tablet by mouth every 4 (four) hours  as needed for pain.  40 tablet  0  . zolpidem (AMBIEN) 10 MG tablet Take 1 tablet (10 mg total) by mouth at bedtime as needed. For sleep  30 tablet  3  . amLODipine (NORVASC) 2.5 MG tablet Take 1 tablet (2.5 mg total) by mouth daily.  30 tablet  5   No facility-administered medications prior to visit.    Review of Systems:   General: Denies fever, chills, sweats. No significant weight loss. Eyes: Denies blurring,significant itching ENT: Denies earache, sore throat, and hoarseness. Cardiovascular: Denies chest pains, palpitations, dyspnea on exertion Respiratory: Denies cough, dyspnea at rest,wheeezing Breast: recent L mastectomy. Seroma formation with 60 cc recently aspirated. GI: Denies nausea, vomiting, diarrhea, constipation, change in bowel habits, abdominal pain, melena, hematochezia GU: Denies penile discharge,+ ED, occ urinary flow / outflow problems. No STD concerns. Musculoskeletal: SIGNIFICANT BACK PAIN Derm: Denies rash, itching Neuro: Denies  paresthesias, frequent falls, frequent headaches Psych: Denies depression, anxiety Endocrine: Denies cold intolerance, heat intolerance, polydipsia Heme: Denies enlarged lymph nodes Allergy: No hayfever   Physical Examination: BP 130/84  Pulse 82  Temp(Src) 98.3 F (36.8 C) (Oral)  Ht 5' 11.5" (1.816 m)  Wt 218 lb (98.884 kg)  BMI 29.98 kg/m2  SpO2 96%  Ideal Body Weight: Weight in (lb) to have BMI = 25: 181.4   Wt Readings from Last 3 Encounters:  02/25/13 218 lb (98.884 kg)  02/20/13 222 lb 9.6 oz (100.971 kg)  02/07/13 217 lb 4 oz (98.544 kg)    GEN: well developed, well nourished, no acute distress Eyes: conjunctiva and lids normal, PERRLA, EOMI ENT: TM clear, nares clear, oral exam WNL Neck: supple, no lymphadenopathy, no thyromegaly, no JVD Pulm: clear to auscultation and percussion, respiratory effort  normal CV: regular rate and rhythm, S1-S2, no murmur, rub or gallop, no bruits, peripheral pulses normal and symmetric, no cyanosis, clubbing, edema or varicosities Chest: L breast healing well, c/d/i. Small fluid collection appreciated.  GI: soft, non-tender; no hepatosplenomegaly, masses; active bowel sounds all quadrants GU: no hernia, testicular mass, penile discharge, or prostate enlargement Lymph: no cervical, axillary or inguinal adenopathy MSK: gait normal, muscle tone and strength WNL, no joint swelling, effusions, discoloration, crepitus  SKIN: clear, good turgor, color WNL, no rashes, lesions, or ulcerations Neuro: normal mental status, normal strength, sensation, and motion Psych: alert; oriented to person, place and time, normally interactive and not anxious or depressed in appearance.   Assessment and Plan:  Routine general medical examination at a health care facility  I have personally reviewed the Medicare Annual Wellness questionnaire and have noted 1. The patient's medical and social history 2. Their use of alcohol, tobacco or illicit drugs 3. Their current medications and supplements 4. The patient's functional ability including ADL's, fall risks, home safety risks and hearing or visual             impairment. 5. Diet and physical activities 6. Evidence for depression or mood disorders  The patients weight, height, BMI and visual acuity have been recorded in the chart I have made referrals, counseling and provided education to the patient based review of the above and I have provided the pt with a written personalized care plan for preventive services.  I have provided the patient with a copy of your personalized plan for preventive services. Instructed to take the time to review along with their updated medication list.   HYPERLIPIDEMIA - Plan: Lipid panel: labs, cont mevacor  HYPERTENSION: stable, cont norvasc  CKD (  chronic kidney disease) stage 3, GFR 30-59  ml/min - Plan: Basic metabolic panel  BENIGN PROSTATIC HYPERTROPHY, WITH URINARY OBSTRUCTION - Plan: PSA, discussed risk benefits of false positives.  Encounter for long-term (current) use of other medications - Plan: CBC with Differential, Hepatic function panel  Immunization due - Plan: Pneumococcal polysaccharide vaccine 23-valent greater than or equal to 2yo subcutaneous/IM  Orders Today:  Orders Placed This Encounter  Procedures  . Pneumococcal polysaccharide vaccine 23-valent greater than or equal to 2yo subcutaneous/IM  . Basic metabolic panel  . CBC with Differential  . Hepatic function panel  . Lipid panel  . PSA    Updated Medication List: (Includes new medications, updates to list, dose adjustments) Meds ordered this encounter  Medications  . amLODipine (NORVASC) 2.5 MG tablet    Sig: Take 5 mg by mouth daily.    Medications Discontinued: Medications Discontinued During This Encounter  Medication Reason  . amLODipine (NORVASC) 2.5 MG tablet       Signed, Iosefa Weintraub T. Chandel Zaun, MD 02/25/2013 2:50 PM

## 2013-02-26 LAB — CBC WITH DIFFERENTIAL/PLATELET
Basophils Relative: 1.1 % (ref 0.0–3.0)
Eosinophils Relative: 2.9 % (ref 0.0–5.0)
HCT: 41 % (ref 39.0–52.0)
Hemoglobin: 13.9 g/dL (ref 13.0–17.0)
Lymphs Abs: 1.1 10*3/uL (ref 0.7–4.0)
Monocytes Relative: 8.4 % (ref 3.0–12.0)
Neutro Abs: 4.6 10*3/uL (ref 1.4–7.7)
WBC: 6.5 10*3/uL (ref 4.5–10.5)

## 2013-02-27 LAB — BASIC METABOLIC PANEL
GFR: 47.26 mL/min — ABNORMAL LOW (ref >60.00)
Glucose, Bld: 95 mg/dL (ref 70–99)
Potassium: 4.8 mEq/L (ref 3.5–5.1)
Sodium: 143 mEq/L (ref 135–145)

## 2013-02-27 LAB — HEPATIC FUNCTION PANEL
ALT: 20 U/L (ref 0–53)
AST: 19 U/L (ref 0–37)
Albumin: 4.2 g/dL (ref 3.5–5.2)

## 2013-02-27 LAB — LIPID PANEL: Cholesterol: 172 mg/dL (ref 0–200)

## 2013-03-11 ENCOUNTER — Other Ambulatory Visit: Payer: Self-pay

## 2013-03-11 ENCOUNTER — Telehealth: Payer: Self-pay

## 2013-03-11 MED ORDER — LOVASTATIN 20 MG PO TABS: ORAL_TABLET | ORAL | Status: DC

## 2013-03-11 NOTE — Telephone Encounter (Signed)
Opened in error

## 2013-03-11 NOTE — Telephone Encounter (Signed)
pts wife request refill lovastatin to [facility] [address]. Done.

## 2013-04-24 IMAGING — CT CT ABD-PELV W/ CM
3 of 6 series · 15 of 46 positions shown, 17 images · IV contrast (Omnipaque 300)
Comparison: None

CLINICAL DATA: Evaluate liver cysts

CT ABDOMEN AND PELVIS WITH CONTRAST
TECHNIQUE: Multidetector CT imaging of the abdomen and pelvis was
performed following the standard protocol during bolus
administration of intravenous contrast.
Contrast: 80mL OMNIPAQUE IOHEXOL 300 MG/ML  SOLN

[Series 2: abd/ pel 5mm · axial · 0.67mm/px · z∈[-452,-62]mm · 10 of 96 slices shown, 12 images]
[im 9/96  soft-tissue]
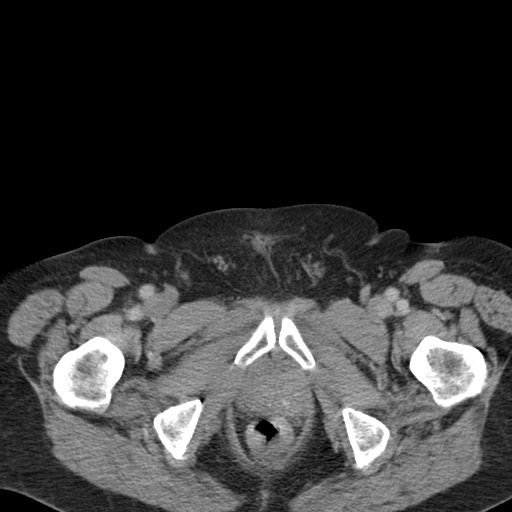
[im 9/96  bone]
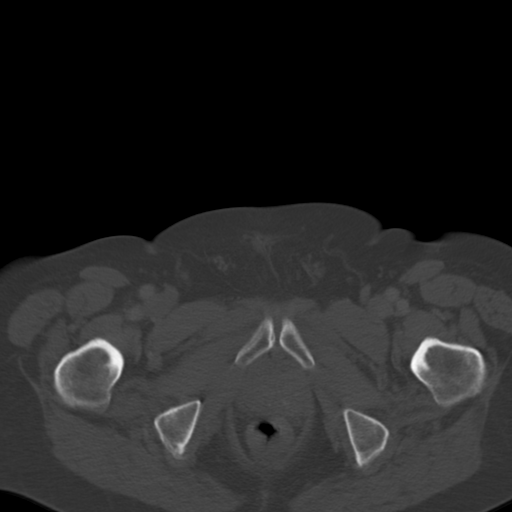
[im 18/96  soft-tissue]
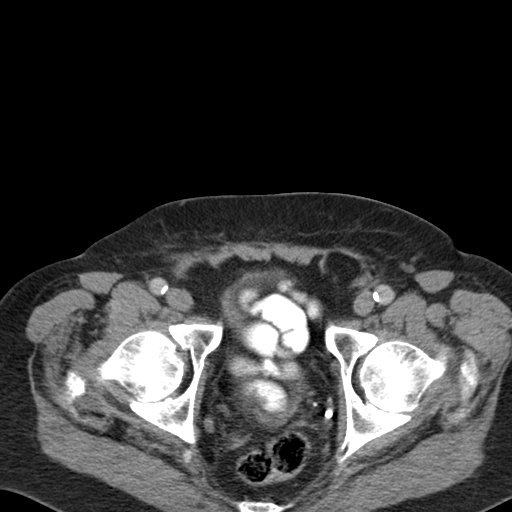
[im 26/96  soft-tissue]
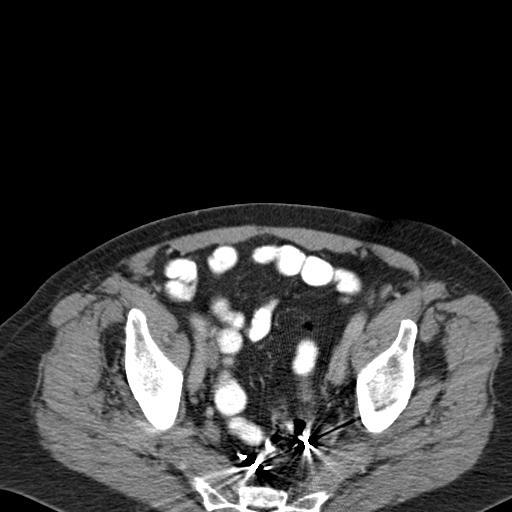
[im 35/96  soft-tissue]
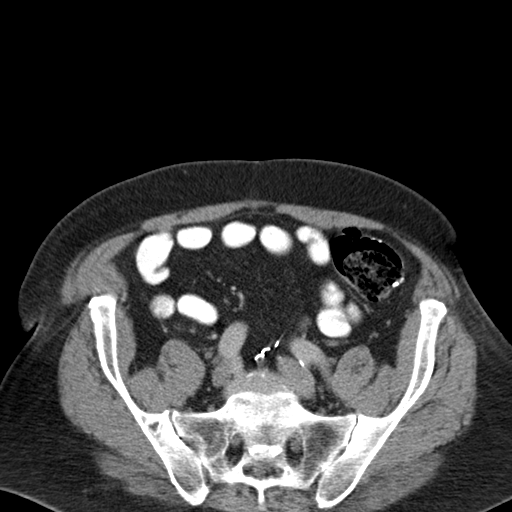
[im 44/96  soft-tissue]
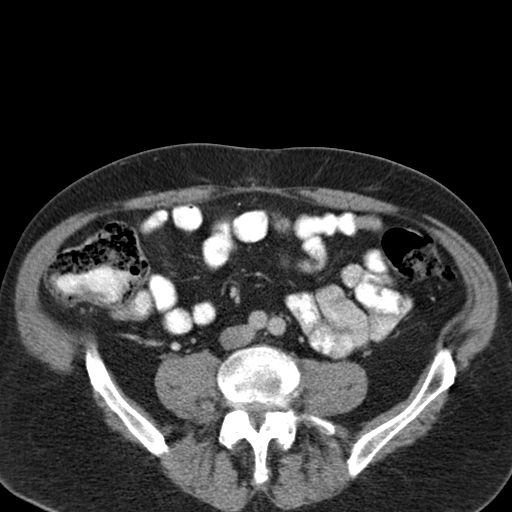
[im 52/96  soft-tissue]
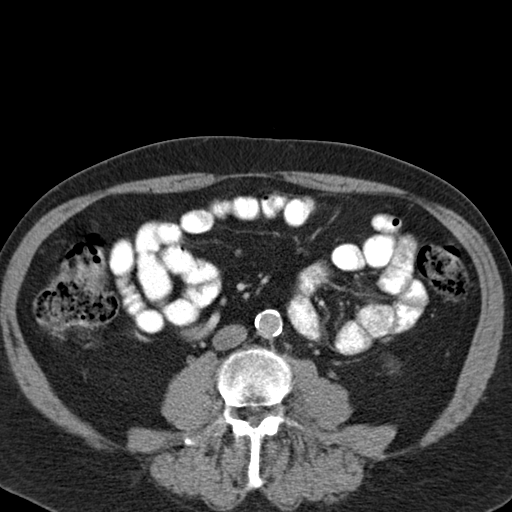
[im 61/96  soft-tissue]
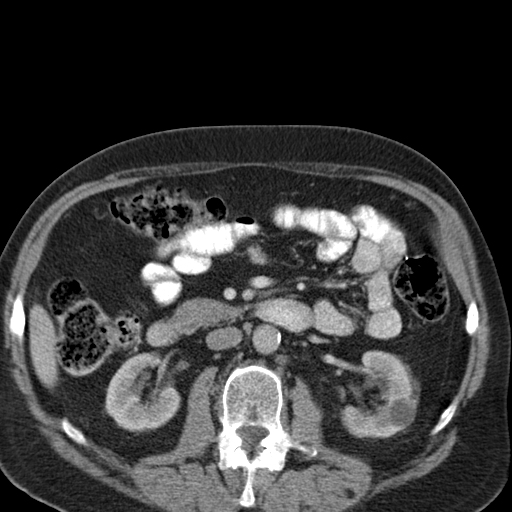
[im 70/96  soft-tissue]
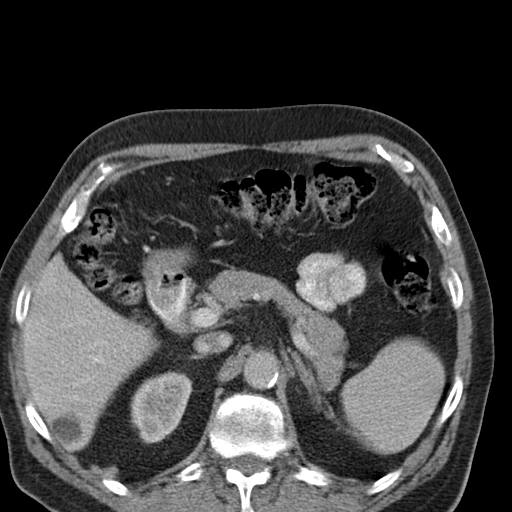
[im 78/96  soft-tissue]
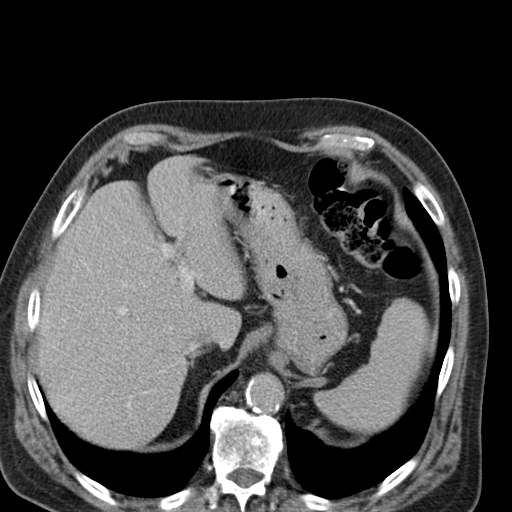
[im 78/96  bone]
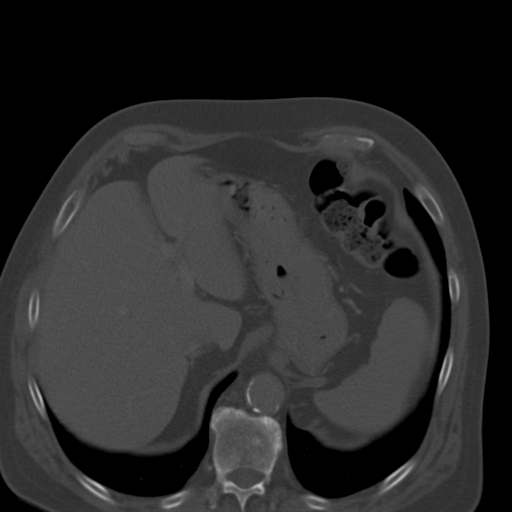
[im 87/96  soft-tissue]
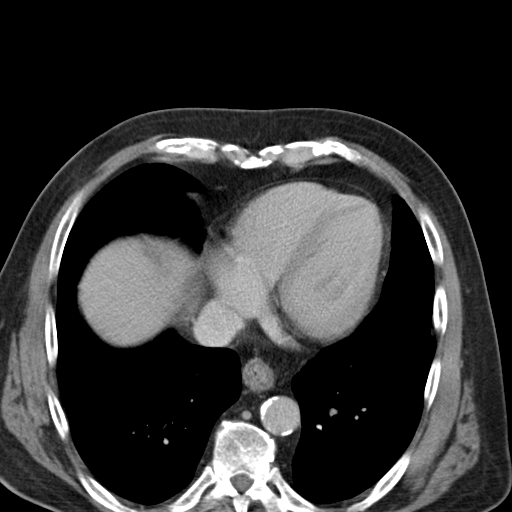

[Series 3: lung 5mm · axial · 0.67mm/px · z∈[-108,-62]mm · 2 of 28 slices shown]
[im 10/28  bone]
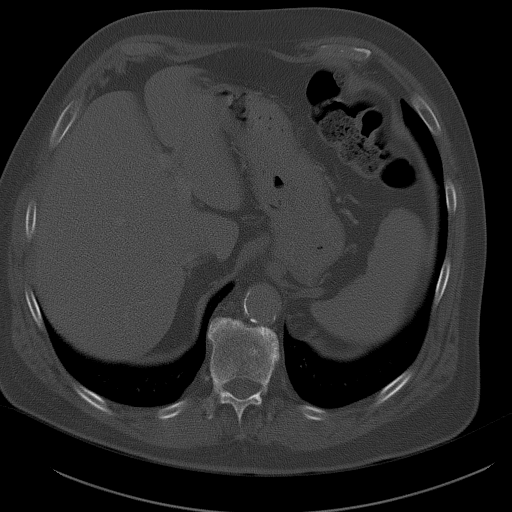
[im 19/28  bone]
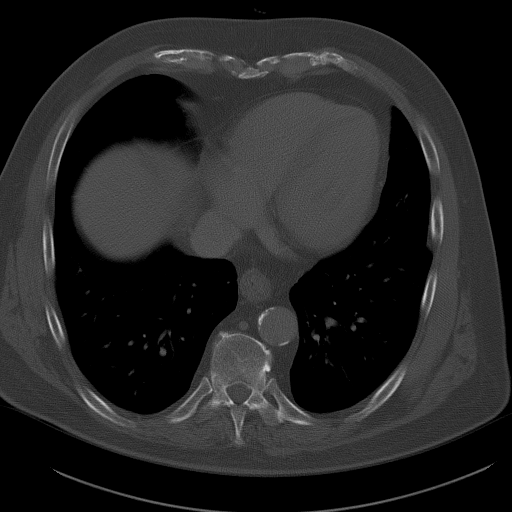

[Series 602: cor · coronal · 0.96mm/px · 3 of 133 slices shown]
[im 45/133  soft-tissue]
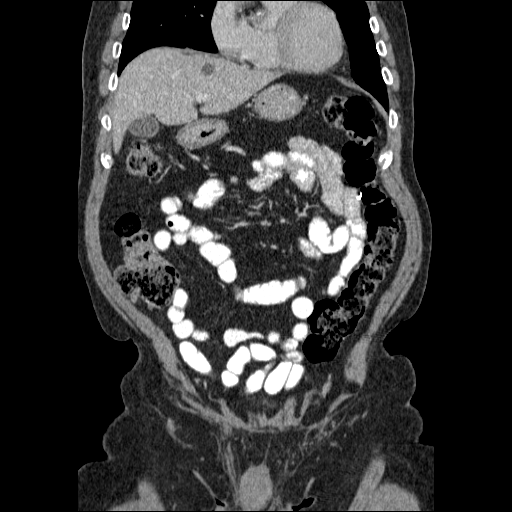
[im 59/133  soft-tissue]
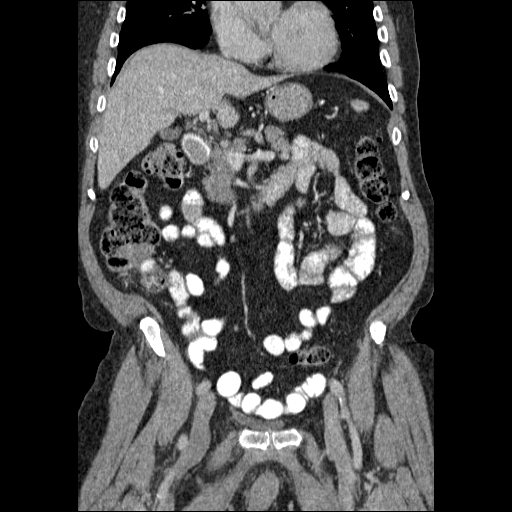
[im 74/133  soft-tissue]
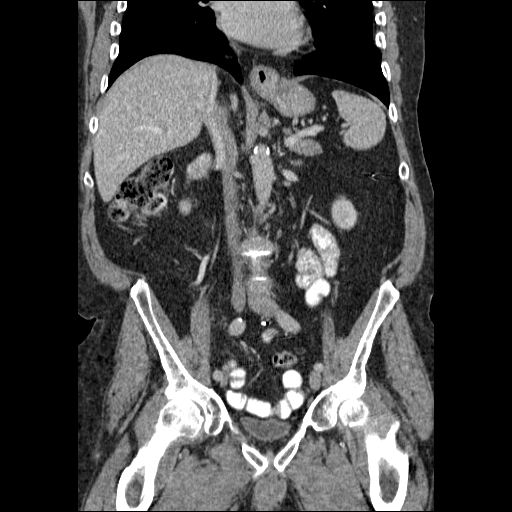

[15 of 46 positions shown; findings below may reference images not displayed]

FINDINGS: The lung bases are clear.  No pericardial or pleural
effusion.

Simple appearing cysts are noted within the liver.  No worrisome
liver lesions identified.  The gallbladder is normal.  No biliary
ductal dilatation.

Normal appearance of the pancreas.  The spleen is normal.

The adrenal glands are both unremarkable.  There is a
nonobstructing stone within the right kidney measuring 3 mm.
Simple appearing cyst is noted within the inferior pole of the
right kidney.  Small nonobstructing calculus is noted within the
left kidney.  Within the lower pole of the left kidney there is a
cyst which measures 1.5 cm, image 37.  The urinary bladder appears
normal.  Prostate gland and seminal vesicles are negative.

The patient has a small hiatal hernia.  The small bowel loops have
a normal course and caliber.  Normal appearance of the colon.
Postoperative changes involving the sigmoid colon identified
consistent with partial colectomy.

Review of the visualized osseous structures is significant for
lumbar degenerative disc disease.
IMPRESSION: 1.  No acute findings within the abdomen or pelvis.
2.  Renal and hepatic cysts.
3.  Nonobstructing bilateral renal calculi.

## 2013-05-09 ENCOUNTER — Ambulatory Visit (INDEPENDENT_AMBULATORY_CARE_PROVIDER_SITE_OTHER): Payer: Medicare Other | Admitting: Internal Medicine

## 2013-05-09 ENCOUNTER — Encounter: Payer: Self-pay | Admitting: Internal Medicine

## 2013-05-09 VITALS — BP 130/70 | HR 72 | Temp 98.2°F | Wt 224.0 lb

## 2013-05-09 DIAGNOSIS — L259 Unspecified contact dermatitis, unspecified cause: Secondary | ICD-10-CM

## 2013-05-09 MED ORDER — PREDNISONE 10 MG PO TABS: ORAL_TABLET | ORAL | Status: DC

## 2013-05-09 MED ORDER — METHYLPREDNISOLONE ACETATE 40 MG/ML IJ SUSP
80.0000 mg | Freq: Once | INTRAMUSCULAR | Status: AC
  Administered 2013-05-09: 80 mg via INTRAMUSCULAR

## 2013-05-09 NOTE — Patient Instructions (Signed)

## 2013-05-09 NOTE — Progress Notes (Signed)
Subjective:    Patient ID: Michael [name], male    DOB: 1931-10-02, 79 y.o.   MRN: [id number]  HPI  Pt presents to the clinic today with c/o a rash all over his body. This started yesterday. He noticed it after he got out of the shower. He is not using any new soaps, shampoo, or laundry detergent. He has also been working out in the yard for about 3-4 hours yesterday weeding and edging grass. He has not come in contact with anything that he is allergic to. He has tried taking triamcinolone cream which has helped relieve the itching some. He did buy some benadryl today but he has not taken it yet. He denies associated symptoms.  Review of Systems  Past Medical History  Diagnosis Date  . Colon cancer 1988  . Ventral hernia 1988  . HLD (hyperlipidemia)   . GERD (gastroesophageal reflux disease)   . Rotator cuff rupture     Right; s/p repair  . BPH (benign prostatic hypertrophy)   . Abnormality of gait   . Unspecified hearing loss   . Impotence of organic origin   . HTN (hypertension)     EKG with clearance and ov Dr Dallas Schimke  on chart and EPIC 02/23/12,  CBC with diff, BMET and Hepatic panel EPIC ffrom PCP  . Renal insufficiency     stones  . Arthritis   . Wears glasses   . Wears hearing aid     both ears  . Back pain     Current Outpatient Prescriptions  Medication Sig Dispense Refill  . amLODipine (NORVASC) 2.5 MG tablet Take 5 mg by mouth daily.      Marland Kitchen lovastatin (MEVACOR) 20 MG tablet TAKE ONE TABLET BY MOUTH EVERY DAY  90 tablet  1  . omeprazole (PRILOSEC) 20 MG capsule Take 1 capsule (20 mg total) by mouth daily as needed.  90 capsule  1  . triamcinolone cream (KENALOG) 0.1 % Apply topically as needed.      . zolpidem (AMBIEN) 10 MG tablet Take 1 tablet (10 mg total) by mouth at bedtime as needed. For sleep  30 tablet  3   No current facility-administered medications for this visit.    No Known Allergies  Family History  Problem Relation Age of Onset  .  Osteoarthritis Father     History   Social History  . Marital Status: Married    Spouse Name: N/A    Number of Children: N/A  . Years of Education: N/A   Occupational History  . Not on file.   Social History Main Topics  . Smoking status: Never Smoker   . Smokeless tobacco: Never Used  . Alcohol Use: Yes     Comment: Wine (1/2-1 glass 2x/week  . Drug Use: No  . Sexual Activity: Not on file   Other Topics Concern  . 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  Skin: Pt reports generalized rash and itching. Denies lesions or ulcercations.    No other specific  complaints in a complete review of systems (except as listed in HPI above).      Objective:   Physical Exam   BP 130/70  Pulse 72  Temp(Src) 98.2 F (36.8 C)  Wt 224 lb (101.606 kg)  BMI 30.81 kg/m2 Wt Readings from Last 3 Encounters:  05/09/13 224 lb (101.606 kg)  02/25/13 218 lb (98.884 kg)  02/20/13 222 lb 9.6 oz (100.971 kg)    General: Appears his stated age, well developed, well nourished in NAD. Skin: Warm, dry and intact. Generalized papular rash not on abdomen, back and all extremities. Cardiovascular: Normal rate and rhythm. S1,S2 noted.  No murmur, rubs or gallops noted. No JVD or BLE edema. No carotid bruits noted. Pulmonary/Chest: Normal effort and positive vesicular breath sounds. No respiratory distress. No wheezes, rales or ronchi noted.    BMET    Component Value Date/Time   NA 143 02/25/2013 1608   K 4.8 02/25/2013 1608   CL 99 02/25/2013 1608   CO2 21 02/25/2013 1608   GLUCOSE 95 02/25/2013 1608   BUN 30* 02/25/2013 1608   CREATININE 1.5 02/25/2013 1608   CALCIUM 9.0  02/25/2013 1608   GFRNONAA 49* 02/05/2013 1500   GFRAA 57* 02/05/2013 1500    Lipid Panel     Component Value Date/Time   CHOL 172 02/25/2013 1608   TRIG 103.0 02/25/2013 1608   HDL 59.50 02/25/2013 1608   CHOLHDL 3 02/25/2013 1608   VLDL 20.6 02/25/2013 1608   LDLCALC 92 02/25/2013 1608    CBC    Component Value Date/Time   WBC 6.5 02/25/2013 1608   RBC 4.22 02/25/2013 1608   HGB 13.9 02/25/2013 1608   HCT 41.0 02/25/2013 1608   PLT 212.0 02/25/2013 1608   MCV 97.1 02/25/2013 1608   MCH 32.1 03/08/2012 0433   MCHC 33.8 02/25/2013 1608   RDW 13.7 02/25/2013 1608   LYMPHSABS 1.1 02/25/2013 1608   MONOABS 0.5 02/25/2013 1608   EOSABS 0.2 02/25/2013 1608   BASOSABS 0.1 02/25/2013 1608    Hgb A1C Lab Results  Component Value Date   HGBA1C 5.6 05/03/2011         Assessment & Plan:   Contact dermatitis with unknown substance:  80 mg Depo IM today eRx for predtaper Benadryl as needed at night to relieve itching and help you sleep  RTC as needed or if symptoms persist or worsen

## 2013-05-09 NOTE — Addendum Note (Signed)
Addended by: Eliezer Bottom on: 05/09/2013 04:54 PM   Modules accepted: Orders

## 2013-07-08 ENCOUNTER — Other Ambulatory Visit: Payer: Self-pay | Admitting: Family Medicine

## 2013-07-18 ENCOUNTER — Other Ambulatory Visit: Payer: Self-pay

## 2013-07-31 ENCOUNTER — Other Ambulatory Visit: Payer: Self-pay | Admitting: Family Medicine

## 2013-07-31 NOTE — Telephone Encounter (Signed)
Called to [facility]. 

## 2013-07-31 NOTE — Telephone Encounter (Signed)
Received refill request electronically. Last office visit 05/09/13/acute visit. Is it okay to refill medication?

## 2013-07-31 NOTE — Telephone Encounter (Signed)
Ok to refill 30, 5 refills 

## 2013-09-09 ENCOUNTER — Ambulatory Visit: Payer: Medicare Other | Admitting: Family Medicine

## 2013-09-09 ENCOUNTER — Encounter: Payer: Self-pay | Admitting: Family Medicine

## 2013-09-09 ENCOUNTER — Ambulatory Visit (INDEPENDENT_AMBULATORY_CARE_PROVIDER_SITE_OTHER): Payer: Medicare Other | Admitting: Family Medicine

## 2013-09-09 VITALS — BP 126/72 | HR 90 | Temp 97.8°F | Wt 222.8 lb

## 2013-09-09 DIAGNOSIS — N529 Male erectile dysfunction, unspecified: Secondary | ICD-10-CM

## 2013-09-09 DIAGNOSIS — J209 Acute bronchitis, unspecified: Secondary | ICD-10-CM

## 2013-09-09 MED ORDER — AZITHROMYCIN 250 MG PO TABS: ORAL_TABLET | ORAL | Status: DC

## 2013-09-09 MED ORDER — GUAIFENESIN-CODEINE 100-10 MG/5ML PO SYRP: 5.0000 mL | ORAL_SOLUTION | Freq: Two times a day (BID) | ORAL | Status: DC | PRN

## 2013-09-09 MED ORDER — PREDNISONE 20 MG PO TABS: ORAL_TABLET | ORAL | Status: DC

## 2013-09-09 NOTE — Progress Notes (Signed)
   Subjective:    Patient ID: KYRIAN STAGE, male    DOB: Aug 02, 1932, 79 y.o.   MRN: [id number]  HPI CC: URI sxs  Over last 4 days noticing URI sxs including rhinorrhea with clear drainage, significant wheeze with expiration that leads to coughing fits.  Some nonproductive cough.  Feeling lousy and fatigued for last 3 days.  Initially with sore throat that is now better.  Denies aspiration history in preceding week. Feeling some better today.    Gargling with salt water, chloraseptic spray.  No fevers/chills, abd pain, nausea, ear or tooth pain, PNDrainage.  4 yo granddaughter coming to visit today.  Her family is all ill "they're all on zpacks". No smokers at home. No h/o asthma, COPD. Mild allergic rhinitis in past.  Past Medical History  Diagnosis Date  . Colon cancer 1988  . Ventral hernia 1988  . HLD (hyperlipidemia)   . GERD (gastroesophageal reflux disease)   . Rotator cuff rupture     Right; s/p repair  . BPH (benign prostatic hypertrophy)   . Abnormality of gait   . Unspecified hearing loss   . Impotence of organic origin   . HTN (hypertension)     EKG with clearance and ov Dr Dallas Schimke  on chart and EPIC 02/23/12,  CBC with diff, BMET and Hepatic panel EPIC ffrom PCP  . Renal insufficiency     stones  . Arthritis   . Wears glasses   . Wears hearing aid     both ears  . Back pain      Review of Systems Per HPI    Objective:   Physical Exam  Nursing note and vitals reviewed. Constitutional: He appears well-developed and well-nourished. No distress.  HENT:  Head: Normocephalic and atraumatic.  Right Ear: Tympanic membrane, external ear and ear canal normal. Decreased hearing is noted.  Left Ear: Tympanic membrane, external ear and ear canal normal. Decreased hearing is noted.  Nose: Nose normal. No mucosal edema or rhinorrhea. Right sinus exhibits no maxillary sinus tenderness and no frontal sinus tenderness. Left sinus exhibits no maxillary sinus tenderness  and no frontal sinus tenderness.  Mouth/Throat: Uvula is midline and mucous membranes are normal. Posterior oropharyngeal erythema present. No oropharyngeal exudate, posterior oropharyngeal edema or tonsillar abscesses.  Posterior oropharyngeal cobblestoning  Eyes: Conjunctivae and EOM are normal. Pupils are equal, round, and reactive to light. No scleral icterus.  Neck: Normal range of motion. Neck supple.  Cardiovascular: Normal rate, regular rhythm, normal heart sounds and intact distal pulses.   No murmur heard. Pulmonary/Chest: Effort normal. No respiratory distress. He has no decreased breath sounds. He has wheezes (expiratory). He has no rhonchi. He has no rales.  Hoarse deep cough with any deep expiration. Wheezing seems to be upper airway transmission mainly  Lymphadenopathy:    He has no cervical adenopathy.  Skin: Skin is warm and dry. No rash noted.       Assessment & Plan:

## 2013-09-09 NOTE — Patient Instructions (Signed)
I think you have bronchitis with tracheal inflammation as well. Treat with steroid course as well as zpack. Codeine cough syrup as needed for night time cough. Let us know if not improving as expected or any worsening symptoms such as fever >101 or worsening productive cough.

## 2013-09-09 NOTE — Assessment & Plan Note (Addendum)
With significant expiratory wheezing which seems more upper airway. NAD today, notoxic. Start azithromycin and steroid course. Will treat with cheratussin codeine cough syrup for night time. Red flags to return discussed.

## 2013-09-09 NOTE — Progress Notes (Signed)
Pre-visit discussion using our clinic review tool. No additional management support is needed unless otherwise documented below in the visit note.  

## 2013-09-09 NOTE — Assessment & Plan Note (Addendum)
Pt requests samples of PDE5 inhibitors - states has taken cialis with good effect in the past. I provided with 4 pill sample of viagra 50mg  today and discussed common side effects and adverse events to monitor for including but not limited to headache, priapism and need to avoid nitrates.  Denies any recent chest pain or tightness.

## 2013-09-22 ENCOUNTER — Other Ambulatory Visit: Payer: Self-pay | Admitting: Family Medicine

## 2013-12-16 ENCOUNTER — Encounter: Payer: Self-pay | Admitting: Gastroenterology

## 2014-01-06 ENCOUNTER — Other Ambulatory Visit: Payer: Self-pay | Admitting: Family Medicine

## 2014-01-21 ENCOUNTER — Other Ambulatory Visit: Payer: Self-pay | Admitting: Family Medicine

## 2014-02-05 ENCOUNTER — Encounter: Payer: Self-pay | Admitting: Family Medicine

## 2014-02-05 ENCOUNTER — Ambulatory Visit (INDEPENDENT_AMBULATORY_CARE_PROVIDER_SITE_OTHER): Payer: Medicare Other | Admitting: Family Medicine

## 2014-02-05 VITALS — BP 119/79 | HR 82 | Temp 98.3°F | Ht 71.5 in | Wt 211.5 lb

## 2014-02-05 DIAGNOSIS — R5383 Other fatigue: Secondary | ICD-10-CM

## 2014-02-05 DIAGNOSIS — R5381 Other malaise: Secondary | ICD-10-CM

## 2014-02-05 DIAGNOSIS — I951 Orthostatic hypotension: Secondary | ICD-10-CM

## 2014-02-05 DIAGNOSIS — R42 Dizziness and giddiness: Secondary | ICD-10-CM

## 2014-02-05 LAB — HEPATIC FUNCTION PANEL
ALT: 22 U/L (ref 0–53)
AST: 27 U/L (ref 0–37)
Albumin: 4.3 g/dL (ref 3.5–5.2)
Alkaline Phosphatase: 57 U/L (ref 39–117)
Bilirubin, Direct: 0.1 mg/dL (ref 0.0–0.3)
Total Bilirubin: 0.8 mg/dL (ref 0.2–1.2)
Total Protein: 6.9 g/dL (ref 6.0–8.3)

## 2014-02-05 LAB — BASIC METABOLIC PANEL
BUN: 28 mg/dL — ABNORMAL HIGH (ref 6–23)
CHLORIDE: 103 meq/L (ref 96–112)
CO2: 28 meq/L (ref 19–32)
CREATININE: 1.7 mg/dL — AB (ref 0.4–1.5)
Calcium: 9.6 mg/dL (ref 8.4–10.5)
GFR: 42.3 mL/min — ABNORMAL LOW (ref >60.00)
Glucose, Bld: 90 mg/dL (ref 70–99)
POTASSIUM: 5.1 meq/L (ref 3.5–5.1)
Sodium: 140 mEq/L (ref 135–145)

## 2014-02-05 LAB — CBC WITH DIFFERENTIAL/PLATELET
BASOS ABS: 0 10*3/uL (ref 0.0–0.1)
Basophils Relative: 0.3 % (ref 0.0–3.0)
Eosinophils Absolute: 0.1 10*3/uL (ref 0.0–0.7)
Eosinophils Relative: 1.6 % (ref 0.0–5.0)
HCT: 42.1 % (ref 39.0–52.0)
Hemoglobin: 14.1 g/dL (ref 13.0–17.0)
LYMPHS PCT: 13 % (ref 12.0–46.0)
Lymphs Abs: 1.1 10*3/uL (ref 0.7–4.0)
MCHC: 33.4 g/dL (ref 30.0–36.0)
MCV: 96 fl (ref 78.0–100.0)
MONOS PCT: 10.4 % (ref 3.0–12.0)
Monocytes Absolute: 0.9 10*3/uL (ref 0.1–1.0)
NEUTROS ABS: 6.2 10*3/uL (ref 1.4–7.7)
NEUTROS PCT: 74.7 % (ref 43.0–77.0)
Platelets: 203 10*3/uL (ref 150.0–400.0)
RBC: 4.39 Mil/uL (ref 4.22–5.81)
RDW: 13.8 % (ref 11.5–15.5)
WBC: 8.3 10*3/uL (ref 4.0–10.5)

## 2014-02-05 LAB — TSH: TSH: 0.72 u[IU]/mL (ref 0.35–4.50)

## 2014-02-05 NOTE — Progress Notes (Signed)
[address] [place] [address] Phone: [phone] Fax: [phone]  Patient ID: Michael [name] MRN: [id number], DOB: Mar 28, 1932, 80 y.o. Date of Encounter: 02/05/2014  Primary Physician:  [name], MD   Chief Complaint: Dizziness, Hot Flashes and Pain in Back on neck  Subjective:   History of Present Illness:  Michael [name] is a 80 y.o. pleasant patient who presents with the following:  The patient comes into his office with a number of complaints, primarily dealing with dizziness.  Feeling dizzy. A few years ago, had some issues back then. Feels something  Felt in his chest. Took twenty minutes before it could subside. He has been having some intermittent orthostatic hypotension for a number years. Today, he is here with his wife, and she describes some episodes were he is acutely dizzy from anywhere from 20-40 minutes at a time, and he is markedly limited and impaired during those time periods.  Has also had some posterior neck discomfort. They seem to be associated with the times of dizziness. He also has a known history of significant spondyloarthropathy and degenerative disc disease of both the cervical and lumbar spine.  Thought maybe affected his vision. No palpitations. He has had no tachycardia. No chest pain.  Sick feeling and hovery in the chest. Heat may bring it on. He also has stage III chronic kidney disease. He admits that he does not drink as much water as his probably ideal Hot flashes and feeling warm.     Patient Active Problem List   Diagnosis Date Noted  . Acute tracheobronchitis 09/09/2013  . Impotence of organic origin   . Gynecomastia, male 02/20/2013  . Seroma complicating a procedure 02/20/2013  . Left breast mass 01/25/2013  . Orthostatic hypotension 05/10/2012  . OA (osteoarthritis) of knee 03/05/2012  . HYPERTENSION 01/08/2010  . BENIGN PROSTATIC HYPERTROPHY, WITH URINARY OBSTRUCTION 01/07/2010  . HEARING LOSS, UNSPEC. 12/08/2008   . GAIT DISTURBANCE 12/08/2008  . HYPERLIPIDEMIA 07/23/2008  . GERD 07/23/2008  . CKD (chronic kidney disease) stage 3, GFR 30-59 ml/min 07/23/2008  . 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 Hemicoloectomy w/ anastomosis  1988    [name]  . Ventral hernia repair  [facility]  . Rotator cuff repair      Right; [facility]  . Vein ligation and stripping      right  . Eye surgery      cataract extraction with IOL bilaterally  . Hernia repair  1963    inguinal  . Rotator cuff repair      right  . Total knee arthroplasty  03/05/2012    Procedure: TOTAL KNEE ARTHROPLASTY;  Surgeon: Loanne Drilling, MD;  Location: [facility];  Service: Orthopedics;  Laterality: Left;  . Colon surgery      colon cancer  . Mastectomy, partial Left 02/07/2013    Procedure: LEFT SUBCUTANEOUS MASTECTOMY;  Surgeon: Wilmon Arms. Corliss Skains, MD;  Location: [facility];  Service: General;  Laterality: Left;   History   Social History  . Marital Status: Married    Spouse Name: N/A    Number of Children: N/A  . Years of Education: N/A   Occupational History  . Not on file.   Social History Main Topics  . Smoking status: Never Smoker   . Smokeless tobacco: Never Used  . Alcohol Use: Yes     Comment:  Wine (1/2-1 glass 2x/week  . Drug Use: No  . Sexual Activity: Not on file   Other Topics Concern  . 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 Osteoarthritis Father    No Known Allergies Medication list has been reviewed and updated.  The PMH, PSH, Social History, Family History, Medications, and allergies have been  reviewed in [facility], and have been updated if relevant.   Review of Systems:  GEN: No acute illnesses, no fevers, chills. GI: No n/v/d, eating normally Pulm: No SOB Neuro as above, ENT as above Otherwise, the pertinent positives and negatives are listed above and in the HPI, otherwise a full review of systems has been reviewed and is negative unless noted positive.   Objective:   Physical Examination: BP 119/79  Pulse 82  Temp(Src) 98.3 F (36.8 C) (Oral)  Ht 5' 11.5" (1.816 m)  Wt 211 lb 8 oz (95.936 kg)  BMI 29.09 kg/m2  Filed Vitals:   02/05/14 1022 02/05/14 1117 02/05/14 1119 02/05/14 1120  BP: 120/70 132/83 139/84 119/79  Pulse: 78 71 74 82  Temp: 98.3 F (36.8 C)     TempSrc: Oral     Height: 5' 11.5" (1.816 m)     Weight: 211 lb 8 oz (95.936 kg)        GEN: WDWN, NAD, Non-toxic, A & O x 3 HEENT: Atraumatic, Normocephalic. Neck supple. No masses, No LAD. Ears and Nose: No external deformity. CV: RRR, No M/G/R. No JVD. No thrill. No extra heart sounds. PULM: CTA B, no wheezes, crackles, rhonchi. No retractions. No resp. distress. No accessory muscle use. EXTR: No c/c/e NEURO Normal gait.  PSYCH: Normally interactive. Conversant. Not depressed or anxious appearing.  Calm demeanor.   Laboratory and Imaging Data: Results for orders placed in visit on 02/05/14  CBC WITH DIFFERENTIAL      Result Value Ref Range   WBC 8.3  4.0 - 10.5 K/uL   RBC 4.39  4.22 - 5.81 Mil/uL   Hemoglobin 14.1  13.0 - 17.0 g/dL   HCT 27.0  62.3 - 76.2 %   MCV 96.0  78.0 - 100.0 fl   MCHC 33.4  30.0 - 36.0 g/dL   RDW 83.1  51.7 - 61.6 %   Platelets 203.0  150.0 - 400.0 K/uL   Neutrophils Relative % 74.7  43.0 - 77.0 %   Lymphocytes Relative 13.0  12.0 - 46.0 %   Monocytes Relative 10.4  3.0 - 12.0 %   Eosinophils Relative 1.6  0.0 - 5.0 %   Basophils Relative 0.3  0.0 - 3.0 %   Neutro Abs 6.2  1.4 - 7.7 K/uL   Lymphs Abs 1.1  0.7 - 4.0 K/uL   Monocytes Absolute 0.9  0.1 - 1.0 K/uL    Eosinophils Absolute 0.1  0.0 - 0.7 K/uL   Basophils Absolute 0.0  0.0 - 0.1 K/uL  BASIC METABOLIC PANEL      Result Value Ref Range   Sodium 140  135 - 145 mEq/L   Potassium  5.1  3.5 - 5.1 mEq/L   Chloride 103  96 - 112 mEq/L   CO2 28  19 - 32 mEq/L   Glucose, Bld 90  70 - 99 mg/dL   BUN 28 (*) 6 - 23 mg/dL   Creatinine, Ser 1.7 (*) 0.4 - 1.5 mg/dL   Calcium 9.6  8.4 - 13.0 mg/dL   GFR 86.57 (*) >84.69 mL/min  HEPATIC FUNCTION PANEL      Result Value Ref Range   Total Bilirubin 0.8  0.2 - 1.2 mg/dL   Bilirubin, Direct 0.1  0.0 - 0.3 mg/dL   Alkaline Phosphatase 57  39 - 117 U/L   AST 27  0 - 37 U/L   ALT 22  0 - 53 U/L   Total Protein 6.9  6.0 - 8.3 g/dL   Albumin 4.3  3.5 - 5.2 g/dL  TSH      Result Value Ref Range   TSH 0.72  0.35 - 4.50 uIU/mL     Assessment & Plan:   Dizziness - Plan: EKG 12-Lead, CBC with Differential, Basic metabolic panel, Hepatic function panel, TSH, Ambulatory referral to ENT  Fatigue - Plan: CBC with Differential, Basic metabolic panel, Hepatic function panel, TSH  Orthostatic hypotension  >40 minutes spent in face to face time with patient, >50% spent in counselling or coordination of care: Extensive conversation with the patient and his wife. My suspicion that this is driven from the inner ear. He has had a normal brain MRI several years ago. His laboratories have returned at this point, and they are normal. I think at this point the next step to help him get a definitive diagnosis would be to consult ENT. Certainly an ENG could definitively say whether this is coming from his inner ear or not.  I am doubtful that the neck pain correlates at all.  This does not appear or sound have a cardiac origin. His EKG is normal.  EKG: Normal sinus rhythm. Normal axis, normal R wave progression, No acute ST elevation or depression.   Follow-up: No Follow-up on file. Unless noted above, the patient is to follow-up if symptoms worsen. Red flags were  reviewed with the patient.  New Prescriptions   No medications on file   Orders Placed This Encounter  Procedures  . CBC with Differential  . Basic metabolic panel  . Hepatic function panel  . TSH  . Ambulatory referral to ENT  . EKG 12-Lead    Signed,  Sharran Caratachea T. Machele Deihl, MD, CA[facility] Sports Medicine   Patient's Medications  New Prescriptions   No medications on file  Previous Medications   AMLODIPINE (NORVASC) 5 MG TABLET    TAKE 1 TABLET BY MOUTH DAILY   LOVASTATIN (MEVACOR) 20 MG TABLET    TAKE ONE TABLET BY MOUTH ONCE DAILY   OMEPRAZOLE (PRILOSEC) 20 MG CAPSULE    TAKE 1 CAPSULE BY MOUTH EVERY DAY AS NEEDED.   OXYCODONE-ACETAMINOPHEN (PERCOCET/ROXICET) 5-325 MG PER TABLET    Take 1 tablet by mouth as needed for severe pain.   TRIAMCINOLONE CREAM (KENALOG) 0.1 %    Apply topically as needed.   ZOLPIDEM (AMBIEN) 10 MG TABLET    TAKE 1 TABLET AT BEDTIME AS NEEDED FOR SLEEP  Modified Medications   No medications on file  Discontinued Medications   AZITHROMYCIN (ZITHROMAX Z-PAK) 250 MG TABLET    Two on day 1 followed by one daily for 4 days for total of 5 days, PO   GUAIFENESIN-CODEINE (ROBITUSSIN  AC) 100-10 MG/5ML SYRUP    Take 5 mLs by mouth 2 (two) times daily as needed for cough.   PREDNISONE (DELTASONE) 20 MG TABLET    Two pills daily for 3 days followed by one pill daily for 4 days

## 2014-02-05 NOTE — Patient Instructions (Signed)
REFERRALS TO SPECIALISTS, SPECIAL TESTS (MRI, CT, ULTRASOUNDS)  GO THE WAITING ROOM AND TELL CHECK IN YOU NEED HELP WITH A REFERRAL. Either MARION or LINDA will help you set it up.  If it is between 1-2 PM they may be at lunch.  After 5 PM, they will likely be at home.  They will call you, so please make sure the office has your correct phone number.  Referrals sometimes can be done same day if urgent, but others can take 2 or 3 days to get an appointment. Starting in 2015, many of the new Medicare insurance plans and Affordable Care Act (Obamacare) Health plans offered take much longer for referrals. They have added additional paperwork and steps.  MRI's and CT's can take up to a week for the test. (Emergencies like strokes take precedence. I will tell you if you have an emergency.)   If your referral is to an in-network [place] office, their office may contact you directly prior to our office reaching you.  -- Examples: [facility], [facility], [facility], [place]            [facility], [facility], and many more.  Specialist appointment times vary a great deal, mostly on the specialist's schedule and if they have openings. -- Our office tries to get you in as fast as possible. -- Some specialists have very long wait times. (Example. Dermatology. Usually months) -- If you have a true emergency like new cancer, we work to get you in ASAP.   

## 2014-02-05 NOTE — Progress Notes (Signed)
Pre visit review using our clinic review tool, if applicable. No additional management support is needed unless otherwise documented below in the visit note. 

## 2014-02-21 ENCOUNTER — Encounter: Payer: Self-pay | Admitting: Neurology

## 2014-02-21 ENCOUNTER — Ambulatory Visit (INDEPENDENT_AMBULATORY_CARE_PROVIDER_SITE_OTHER): Payer: Medicare Other | Admitting: Neurology

## 2014-02-21 VITALS — BP 99/65 | HR 78 | Ht 69.0 in | Wt 214.5 lb

## 2014-02-21 DIAGNOSIS — R42 Dizziness and giddiness: Secondary | ICD-10-CM

## 2014-02-21 DIAGNOSIS — G4752 REM sleep behavior disorder: Secondary | ICD-10-CM

## 2014-02-21 HISTORY — DX: REM sleep behavior disorder: G47.52

## 2014-02-21 NOTE — Progress Notes (Signed)
Reason for visit: Dizziness  Michael [name] is a 80 y.o. male  History of present illness:  Michael [name] is a 80 year old right-handed white male with a history of episodic dizziness that began 2 years ago. The patient indicates that the dizziness began after a left total knee replacement. The patient has had episodes of lightheaded sensations without true vertigo. The patient indicates that the episodes will generally occur while he is standing, not while sitting or lying down. He indicates that when he gets an episode of dizziness, if he sits down the dizziness will dissipate very rapidly. The episodes may occur several times a week, and may occur several times during a single day. The episodes have changed recently within the last several weeks. He has had at least 2 episodes with prolonged dizziness lasting 45 minutes. The episodes are now associated with a chest discomfort that then spreads with severe neck pain, and then lightheaded sensations. The patient will have to sit down with improvement of the dizziness, but if he stands up before 45 minutes has past, the dizziness will ensue again. He reports no palpitations of the heart. He has no pain down the arms. He indicates that heat exposure may bring on an episode, and possibly bending or stooping may initiate an event. The patient reports no focal numbness or weakness of the extremities, and he has never had syncope with the events. He has known cervical arthritis, but in general he does not have neck discomfort with the exception of the discomfort associated with the episodes of dizziness. He does have decreased auditory acuity, and wears bilateral hearing aids. He is sent to this office for an evaluation. He was seen by Dr. Polly Cobia previously, but no workup was done as the patient is not having true vertigo. The patient denies headache.  Past Medical History  Diagnosis Date  . Colon cancer 1988  . Ventral hernia 1988  . HLD (hyperlipidemia)    . GERD (gastroesophageal reflux disease)   . Rotator cuff rupture     Right; s/p repair  . BPH (benign prostatic hypertrophy)   . Abnormality of gait   . Unspecified hearing loss   . Impotence of organic origin   . HTN (hypertension)   . Renal insufficiency     stones  . Arthritis   . Wears hearing aid     both ears  . Chronic back pain   . 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 HOH (hard of hearing)     bilateral hearing aids    Past Surgical History  Procedure Laterality Date  . Hemicoloectomy w/ anastomosis  [place]  . Ventral hernia repair  [facility]  . Rotator cuff repair      Right; [facility]  . Vein ligation and stripping      right  . Eye surgery      cataract extraction with IOL bilaterally  . Hernia repair  1963    inguinal  . Rotator cuff repair      right  . Total knee arthroplasty  03/05/2012    Procedure: TOTAL KNEE ARTHROPLASTY;  Surgeon: Gearlean Alf, MD;  Location: [facility];  Service: Orthopedics;  Laterality: Left;  . Colon surgery      colon cancer  . Mastectomy, partial Left 02/07/2013    Procedure: LEFT SUBCUTANEOUS MASTECTOMY;  Surgeon: Imogene Burn. Georgette Dover, MD;  Location: [place];  Service: General;  Laterality: Left;    Family History  Problem Relation Age of Onset  . Osteoarthritis Father   . Congestive Heart Failure Mother     Social history:  reports that he has never smoked. He has never used smokeless tobacco. He reports that he drinks alcohol. He reports that he does not use illicit drugs.  Medications:  Current Outpatient Prescriptions on File Prior to Visit  Medication Sig Dispense Refill  . amLODipine (NORVASC) 5 MG tablet TAKE 1 TABLET BY MOUTH DAILY  30 tablet  2  . lovastatin (MEVACOR) 20 MG tablet TAKE ONE TABLET BY MOUTH ONCE DAILY  90 tablet  1  . omeprazole (PRILOSEC) 20 MG capsule TAKE 1 CAPSULE BY MOUTH EVERY DAY AS NEEDED.  90 capsule  0  . oxyCODONE-acetaminophen  (PERCOCET/ROXICET) 5-325 MG per tablet Take 1 tablet by mouth as needed for severe pain.      Marland Kitchen triamcinolone cream (KENALOG) 0.1 % Apply topically as needed.      . zolpidem (AMBIEN) 10 MG tablet TAKE 1 TABLET AT BEDTIME AS NEEDED FOR SLEEP  30 tablet  5   No current facility-administered medications on file prior to visit.     No Known Allergies  ROS:  Out of a complete 14 system review of symptoms, the patient complains only of the following symptoms, and all other reviewed systems are negative.  Hearing loss, ringing in the ears Blurred vision Easy bruising, easy bleeding Feeling hot Dizziness Not enough sleep, drowsiness during the day  Blood pressure 99/65, pulse 78, height 5\' 9"  (1.753 m), weight 214 lb 8 oz (97.297 kg).  Blood pressure, right arm, standing is 118/78. Blood pressure, sitting, right arm is 128/80.  Physical Exam  General: The patient is alert and cooperative at the time of the examination.  Eyes: Pupils are equal, round, and reactive to light. Discs are flat bilaterally.  Neck: The neck is supple, no carotid bruits are noted.  Respiratory: The respiratory examination is clear.  Cardiovascular: The cardiovascular examination reveals a regular rate and rhythm, no obvious murmurs or rubs are noted.  Skin: Extremities are with 1+ edema at the ankles.  Neurologic Exam  Mental status: The patient is alert and oriented x 3 at the time of the examination. The patient has apparent normal recent and remote memory, with an apparently normal attention span and concentration ability.  Cranial nerves: Facial symmetry is present. There is good sensation of the face to pinprick and soft touch bilaterally. The strength of the facial muscles and the muscles to head turning and shoulder shrug are normal bilaterally. Speech is well enunciated, no aphasia or dysarthria is noted. Extraocular movements are full. Visual fields are full. The tongue is midline, and the patient  has symmetric elevation of the soft palate. No obvious hearing deficits are noted.  Motor: The motor testing reveals 5 over 5 strength of all 4 extremities. Good symmetric motor tone is noted throughout.  Sensory: Sensory testing is intact to pinprick, soft touch, vibration sensation, and position sense on all 4 extremities. No evidence of extinction is noted.  Coordination: Cerebellar testing reveals good finger-nose-finger and heel-to-shin bilaterally.  Gait and station: Gait is normal. Tandem gait is unsteady. Romberg is negative. No drift is seen.  Reflexes: Deep tendon reflexes are symmetric and normal bilaterally, with the exception that the ankle jerk reflexes are depressed bilaterally.. Toes are downgoing bilaterally.   Assessment/Plan:  1. Episodic dizziness  2. REM sleep behavior disorder  The patient has  had episodes of lightheaded sensations that are now associated with some chest discomfort, and neck pain and subsequent lightheaded sensations. The episodes are unusual, and could potentially be associated with cardiac ischemia or a hiatal hernia. The patient be evaluated for a neurologic etiology of the dizziness. MRI of the brain will be done with MRA of the head. Carotid Doppler study will be done. The patient should undergo a cardiac evaluation in the future.  Jill Alexanders MD 02/21/2014 1:37 PM  [facility] [address] [place], [place] [address]  Phone [phone] Fax [phone]

## 2014-02-21 NOTE — Patient Instructions (Signed)

## 2014-03-11 ENCOUNTER — Ambulatory Visit
Admission: RE | Admit: 2014-03-11 | Discharge: 2014-03-11 | Disposition: A | Discharge status: Home / Self Care | Payer: Medicare Other | Source: Ambulatory Visit | Attending: Neurology | Admitting: Neurology

## 2014-03-11 ENCOUNTER — Telehealth: Payer: Self-pay | Admitting: Neurology

## 2014-03-11 ENCOUNTER — Ambulatory Visit (INDEPENDENT_AMBULATORY_CARE_PROVIDER_SITE_OTHER): Payer: Medicare Other

## 2014-03-11 DIAGNOSIS — G4752 REM sleep behavior disorder: Secondary | ICD-10-CM

## 2014-03-11 DIAGNOSIS — R42 Dizziness and giddiness: Secondary | ICD-10-CM

## 2014-03-11 NOTE — Telephone Encounter (Signed)
I called patient. The MRI the brain was relatively unremarkable, and the MRA of the head was normal. He has had a carotid Doppler studies, I do not have the report yet, we'll call him when I do. He indicates that he is still having some episodes of dizziness.    MRA head 03/11/2014:  Impression   Normal MRA head (without).   MRI brain 03/11/2014:  IMPRESSION:  Equivocal MRI brain (without) demonstrating a few punctate foci of non-specific periventricular and subcortical gliosis. No acute findings. No change from MRI on 12/09/08.

## 2014-03-13 ENCOUNTER — Telehealth: Payer: Self-pay | Admitting: Neurology

## 2014-03-13 NOTE — Telephone Encounter (Signed)
Called pt and spoke with pt's wife Michael [name] informing her that the pt's results for the US carotid doppler is not in yet. I advised the wife that if the pt has any other problems, questions or concerns to call the office. Wife verbalized understanding.

## 2014-03-13 NOTE — Telephone Encounter (Signed)
Patient calling to request results of carotid doppler. Please return call to patient and advise.

## 2014-03-17 NOTE — Telephone Encounter (Signed)
Alerted patient and spouse that doppler results are not back yet. They are very anxious and getting a little upset.

## 2014-03-17 NOTE — Telephone Encounter (Signed)
Patient's wife calling again to check on the results of carotid doppler, they are very anxious for results. Please return call to patient and advise.

## 2014-03-17 NOTE — Telephone Encounter (Signed)
Please call with Carotid Doppler results. Patient and his wife are upset that it has taken so long. They said they did receive MRI results.

## 2014-03-18 ENCOUNTER — Other Ambulatory Visit: Payer: Self-pay | Admitting: Family Medicine

## 2014-03-18 ENCOUNTER — Telehealth: Payer: Self-pay

## 2014-03-18 DIAGNOSIS — R42 Dizziness and giddiness: Secondary | ICD-10-CM

## 2014-03-18 NOTE — Telephone Encounter (Signed)
Mrs Thain left v/m; pt saw neurologist,Dr Jannifer Franklin and results were OK for circulation to head and pt was advised to see cardiologist. Mrs Horsch wants to know if PCP has to do referral and if so pt wants to see Dr Rockey Situ. Mrs Ackerman request cb at [phone].

## 2014-03-18 NOTE — Telephone Encounter (Signed)
Dr. Tobey Grim notes reviewed.   On 03/13/2014, he noted the patient was having some chest pain. If this is ongoing chest pain or a change, we should see him acutely. Dizziness has been ongoing.   I suspect it will take weeks to get in to see Dr. Rockey Situ. So, I need to know how acute are his symptoms.

## 2014-03-18 NOTE — Telephone Encounter (Signed)
I do not have a clear reason for consult and I still suspect inner ear. I do not think an ENG was done to evaluate. I greatly appreciate my colleagues in cardiology evaluating this almost 80 year old. Arrythmia cannot fully be excluded.

## 2014-03-18 NOTE — Telephone Encounter (Signed)
I called the patient. The carotid Doppler study was unremarkable. The patient indicates that he is having episodes of chest pain going into neck discomfort associated with the dizziness. I would recommend that he discuss this with his primary care physician, consider a cardiology evaluation. It is possible that the patient could be having reflux episodes as he sometimes feels nauseated with these events. MRI the brain and MRA of the head were unremarkable.

## 2014-03-18 NOTE — Telephone Encounter (Signed)
Spoke with Michael [name].  He states he is not currently having any chest pain.  He states this is just the next step to try and figure out what is going on. He states they started with ENT and then Neurology and the next step was to see a heart doctor.  He just ask that we try to get an appointment with Dr. Rockey Situ just as soon as we can.

## 2014-03-31 ENCOUNTER — Other Ambulatory Visit: Payer: Self-pay | Admitting: Family Medicine

## 2014-04-02 ENCOUNTER — Encounter: Payer: Self-pay | Admitting: Cardiovascular Disease

## 2014-04-02 ENCOUNTER — Ambulatory Visit (INDEPENDENT_AMBULATORY_CARE_PROVIDER_SITE_OTHER): Payer: Medicare Other | Admitting: Cardiovascular Disease

## 2014-04-02 VITALS — BP 130/78 | HR 81 | Ht 70.5 in | Wt 213.8 lb

## 2014-04-02 DIAGNOSIS — R42 Dizziness and giddiness: Secondary | ICD-10-CM

## 2014-04-02 DIAGNOSIS — N183 Chronic kidney disease, stage 3 unspecified: Secondary | ICD-10-CM

## 2014-04-02 DIAGNOSIS — I10 Essential (primary) hypertension: Secondary | ICD-10-CM

## 2014-04-02 DIAGNOSIS — E785 Hyperlipidemia, unspecified: Secondary | ICD-10-CM

## 2014-04-02 NOTE — Assessment & Plan Note (Signed)
Suggested he stay on his lovastatin for now

## 2014-04-02 NOTE — Progress Notes (Signed)
Patient ID: MAZIAH KEELING, male    DOB: September 03, 1932, 80 y.o.   MRN: [id number]  HPI Comments: [name] is a pleasant 80 year old gentleman with history of total knee replacement, bone hernia with prior repair renal dysfunction, presenting for symptoms of lightheadedness/dizziness.  He reports that symptoms started 2 years ago after his total knee replacement. He had periodic episodes of lightheadedness when standing that resolved when sitting or when supine. Symptoms have been getting worse over the past 6 months. He has episodes every other day, last episode was yesterday. Now he reports having a sick feeling that seems to start in his chest and go up to his head when he is standing with his lightheadedness and dizziness. Symptoms always better when sitting or laying down. One episode recently lasting 45 minutes that did not seem to go away, though was better with sitting and laying down. Sometimes has episodes triggered by the hot weather. He focuses on his work, does not seem to be bothered, seems to be symptomatic when he stops working. Some neck discomfort, posterior head discomfort, occasional jaw discomfort. Sometimes has diaphoresis and feels hot when he has these symptoms. He has never checked his blood pressure or pulse rate when he has these episodes  Recent EKG showed normal sinus rhythm with no significant ST or T wave changes Reports developing a recurrent hernia recently  Recent blood work showing BUN 20, creatinine 1.7. Baseline creatinine appears 1.4   Outpatient Encounter Prescriptions as of 04/02/2014  Medication Sig  . amLODipine (NORVASC) 5 MG tablet TAKE 1 TABLET BY MOUTH DAILY  . aspirin 325 MG EC tablet Take 325 mg by mouth daily.  . Cholecalciferol (VITAMIN D3) 2000 UNITS TABS Take 1 tablet by mouth as needed.  Marland Kitchen HYDROcodone-acetaminophen (NORCO) 10-325 MG per tablet Take 1 tablet by mouth as needed.  . lovastatin (MEVACOR) 20 MG tablet TAKE ONE TABLET BY MOUTH ONCE DAILY   . Melatonin 10 MG TABS Take 1 tablet by mouth as needed.  Marland Kitchen omeprazole (PRILOSEC) 20 MG capsule TAKE 1 CAPSULE BY MOUTH EVERY DAY AS NEEDED.  Marland Kitchen oxyCODONE-acetaminophen (PERCOCET/ROXICET) 5-325 MG per tablet Take 1 tablet by mouth as needed for severe pain.  Marland Kitchen triamcinolone cream (KENALOG) 0.1 % Apply topically as needed.  . zolpidem (AMBIEN) 10 MG tablet TAKE 1 TABLET AT BEDTIME AS NEEDED FOR SLEEP     Review of Systems  Constitutional: Negative.        Episodes of sweating, chest and neck symptoms with nausea  HENT: Negative.   Eyes: Negative.   Respiratory: Negative.   Cardiovascular: Negative.   Gastrointestinal: Negative.   Endocrine: Negative.   Musculoskeletal: Negative.   Skin: Negative.   Allergic/Immunologic: Negative.   Neurological: Positive for dizziness and light-headedness.  Hematological: Negative.   Psychiatric/Behavioral: Negative.   All other systems reviewed and are negative.   BP 130/78  Pulse 81  Ht 5' 10.5" (1.791 m)  Wt 213 lb 12 oz (96.956 kg)  BMI 30.23 kg/m2  Physical Exam  Nursing note and vitals reviewed. Constitutional: He is oriented to person, place, and time. He appears well-developed and well-nourished.  HENT:  Head: Normocephalic.  Nose: Nose normal.  Mouth/Throat: Oropharynx is clear and moist.  Eyes: Conjunctivae are normal. Pupils are equal, round, and reactive to light.  Neck: Normal range of motion. Neck supple. No JVD present.  Cardiovascular: Normal rate, regular rhythm, S1 normal, S2 normal, normal heart sounds and intact distal pulses.  Exam reveals no gallop  and no friction rub.   No murmur heard. Pulmonary/Chest: Effort normal and breath sounds normal. No respiratory distress. He has no wheezes. He has no rales. He exhibits no tenderness.  Abdominal: Soft. Bowel sounds are normal. He exhibits no distension. There is no tenderness.  Musculoskeletal: Normal range of motion. He exhibits no edema and no tenderness.   Lymphadenopathy:    He has no cervical adenopathy.  Neurological: He is alert and oriented to person, place, and time. Coordination normal.  Skin: Skin is warm and dry. No rash noted. No erythema.  Psychiatric: He has a normal mood and affect. His behavior is normal. Judgment and thought content normal.      Assessment and Plan

## 2014-04-02 NOTE — Assessment & Plan Note (Signed)
Etiology of his renal dysfunction is unclear. Encouraged fluid intake. Baseline creatinine appears 1.4, recently up to 1.7

## 2014-04-02 NOTE — Patient Instructions (Signed)
Hold the amlodipine for 2 weeks  to see if symptoms get better  We will order a 30 day monitor for lightheaded spells, near-syncope If no arrhythmia, we will order a stress test  Please call us if you have new issues that need to be addressed before your next appt.  Your physician wants you to follow-up in: 2 -3 weeks

## 2014-04-02 NOTE — Assessment & Plan Note (Signed)
Uncertain if amlodipine is playing a role in his symptoms. He was not orthostatic in the office today. Blood pressure in the 120 range with standing. Suggested he hold amlodipine for several weeks to see if this helps his symptoms

## 2014-04-02 NOTE — Assessment & Plan Note (Signed)
He was not orthostatic in the office. Unable to exclude periods of low blood pressure and we have suggested he hold his amlodipine for several weeks please see if symptoms improve.  Symptoms concerning for arrhythmia and we have requested a 30 day event monitor. He does not check his blood pressure or pulse rate when he has symptoms. Less likely angina as symptoms typically occur at rest and with exertion. Symptoms are happening more frequently, lasting longer concerning for arrhythmia. We have suggested if no arrhythmia noted when he has symptoms, ischemia workup with stress test would be offered.

## 2014-04-05 DIAGNOSIS — R42 Dizziness and giddiness: Secondary | ICD-10-CM

## 2014-04-07 ENCOUNTER — Telehealth: Payer: Self-pay

## 2014-04-07 MED ORDER — DILTIAZEM HCL 30 MG PO TABS: 30.0000 mg | ORAL_TABLET | Freq: Three times a day (TID) | ORAL | Status: DC

## 2014-04-07 NOTE — Telephone Encounter (Signed)
Spoke w/ pt.  Advised him of Dr. Donivan Scull recommendation. He reports that he stopped amlodipine on 04/03/14. Reports his BP has been rising since then and his BP was high when he had the dizzy spell this am.  Advised him to start the diltiazem, continue to monitor his BP and continue to wear his monitor.  He is agreeable and will call w/ any questions or concerns.

## 2014-04-07 NOTE — Telephone Encounter (Signed)
Attempted to contact pt regarding serious [facility] report. Per Dr. Rockey Situ,   "Would hold amlodipine, start diltiazem 30mg  PO TID"  Left message at pt's home and mobile #s to call back asap.

## 2014-04-10 ENCOUNTER — Telehealth: Payer: Self-pay | Admitting: *Deleted

## 2014-04-10 MED ORDER — METOPROLOL TARTRATE 25 MG PO TABS: 25.0000 mg | ORAL_TABLET | Freq: Two times a day (BID) | ORAL | Status: DC

## 2014-04-10 NOTE — Telephone Encounter (Signed)
Per Dr. Rockey Situ, "Interested in ablation?  Maybe office visit to discuss or EP" or metoprolol 25mg  BID.   Pt states that his sx are not bothersome, so he would like to try metoprolol for now and keep appt w/ Dr. Rockey Situ on 04/18/14.

## 2014-04-10 NOTE — Telephone Encounter (Signed)
E cardio notified us that patient had a run of SVT  Heart rate max was 160 and lasted 90 seconds   Patient stated he felt the episode  He stated "it was not that bad and I did not need to sit down" He states it feels fine now   Event printed and given to Dr. Rockey Situ for review

## 2014-04-18 ENCOUNTER — Encounter: Payer: Self-pay | Admitting: Cardiovascular Disease

## 2014-04-18 ENCOUNTER — Ambulatory Visit (INDEPENDENT_AMBULATORY_CARE_PROVIDER_SITE_OTHER): Payer: Medicare Other | Admitting: Cardiovascular Disease

## 2014-04-18 ENCOUNTER — Other Ambulatory Visit: Payer: Self-pay | Admitting: Family Medicine

## 2014-04-18 ENCOUNTER — Telehealth: Payer: Self-pay

## 2014-04-18 VITALS — BP 118/78 | HR 56 | Ht 71.0 in | Wt 213.0 lb

## 2014-04-18 DIAGNOSIS — I498 Other specified cardiac arrhythmias: Secondary | ICD-10-CM

## 2014-04-18 DIAGNOSIS — I471 Supraventricular tachycardia: Secondary | ICD-10-CM

## 2014-04-18 DIAGNOSIS — I10 Essential (primary) hypertension: Secondary | ICD-10-CM

## 2014-04-18 DIAGNOSIS — R42 Dizziness and giddiness: Secondary | ICD-10-CM

## 2014-04-18 MED ORDER — DILTIAZEM HCL ER COATED BEADS 120 MG PO CP24: 120.0000 mg | ORAL_CAPSULE | Freq: Every day | ORAL | Status: DC

## 2014-04-18 NOTE — Assessment & Plan Note (Signed)
Narrow complex tachycardia consistent with SVT seen on 30 day monitor. Symptoms seem to have improved on low-dose diltiazem and metoprolol 25 mg twice a day. We'll change the diltiazem to 120 g daily and continue metoprolol tartrate 25 mg twice a day. He'll continue to wear the monitor for 2 more weeks. Results of the monitor was shown to him in detail with detailed discussion of SVT

## 2014-04-18 NOTE — Telephone Encounter (Signed)
Spoke w/ pharmacist.  He reports that they just received script for diltiazem and to disregard message.

## 2014-04-18 NOTE — Progress Notes (Signed)
Patient ID: Michael [name], male    DOB: Feb 09, 1932, 80 y.o.   MRN: [id number]  HPI Comments: [name] is a pleasant 80 year old gentleman with history of total knee replacement, bone hernia with prior repair renal dysfunction, who presents for followup of lightheadedness/dizziness.  30 day monitor was ordered The showed runs of SVT, heart rate up to 177 beats per minute associated with dizziness. He was started on diltiazem 30 mg 3 times a day.  He continued to have rare short runs of SVT. Metoprolol 25 mg twice a day was added Since then he has had no further episodes of dizziness concerning for arrhythmia. He continues to have his 30 day monitor in place and has 2 additional weeks remaining Overall he feels well with no orthostasis or tachycardia. No recent dizziness in the past 4 days  Rarely he will have a fleeting hot episode that resolved on its own  Reports developing a recurrent hernia recently  Recent blood work showing BUN 20, creatinine 1.7. Baseline creatinine appears 1.4  EKG shows normal sinus rhythm with rate 56 beats per minute, nonspecific ST abnormality in the anterolateral leads   Outpatient Encounter Prescriptions as of 04/18/2014  Medication Sig  . aspirin 325 MG EC tablet Take 325 mg by mouth daily.  . Cholecalciferol (VITAMIN D3) 2000 UNITS TABS Take 1 tablet by mouth as needed.  . diltiazem (CARDIZEM) 30 MG tablet Take 1 tablet (30 mg total) by mouth 3 (three) times daily.  Marland Kitchen HYDROcodone-acetaminophen (NORCO) 10-325 MG per tablet Take 1 tablet by mouth as needed.  . lovastatin (MEVACOR) 20 MG tablet TAKE ONE TABLET BY MOUTH ONCE DAILY  . Melatonin 10 MG TABS Take 1 tablet by mouth as needed.  . metoprolol tartrate (LOPRESSOR) 25 MG tablet Take 1 tablet (25 mg total) by mouth 2 (two) times daily.  Marland Kitchen omeprazole (PRILOSEC) 20 MG capsule TAKE 1 CAPSULE BY MOUTH EVERY DAY AS NEEDED.  Marland Kitchen oxyCODONE-acetaminophen (PERCOCET/ROXICET) 5-325 MG per tablet Take 1 tablet by  mouth as needed for severe pain.  Marland Kitchen triamcinolone cream (KENALOG) 0.1 % Apply topically as needed.  . zolpidem (AMBIEN) 10 MG tablet TAKE 1 TABLET AT BEDTIME AS NEEDED FOR SLEEP    Review of Systems  Constitutional: Negative.        Episodes of sweating, chest and neck symptoms with nausea  HENT: Negative.   Eyes: Negative.   Respiratory: Negative.   Cardiovascular: Negative.   Gastrointestinal: Negative.   Endocrine: Negative.   Musculoskeletal: Negative.   Skin: Negative.   Allergic/Immunologic: Negative.   Neurological: Positive for dizziness and light-headedness.  Hematological: Negative.   Psychiatric/Behavioral: Negative.   All other systems reviewed and are negative.   BP 118/78  Pulse 56  Ht 5\' 11"  (1.803 m)  Wt 213 lb (96.616 kg)  BMI 29.72 kg/m2  Physical Exam  Nursing note and vitals reviewed. Constitutional: He is oriented to person, place, and time. He appears well-developed and well-nourished.  HENT:  Head: Normocephalic.  Nose: Nose normal.  Mouth/Throat: Oropharynx is clear and moist.  Eyes: Conjunctivae are normal. Pupils are equal, round, and reactive to light.  Neck: Normal range of motion. Neck supple. No JVD present.  Cardiovascular: Normal rate, regular rhythm, S1 normal, S2 normal, normal heart sounds and intact distal pulses.  Exam reveals no gallop and no friction rub.   No murmur heard. Pulmonary/Chest: Effort normal and breath sounds normal. No respiratory distress. He has no wheezes. He has no rales. He exhibits  no tenderness.  Abdominal: Soft. Bowel sounds are normal. He exhibits no distension. There is no tenderness.  Musculoskeletal: Normal range of motion. He exhibits no edema and no tenderness.  Lymphadenopathy:    He has no cervical adenopathy.  Neurological: He is alert and oriented to person, place, and time. Coordination normal.  Skin: Skin is warm and dry. No rash noted. No erythema.  Psychiatric: He has a normal mood and affect.  His behavior is normal. Judgment and thought content normal.      Assessment and Plan

## 2014-04-18 NOTE — Telephone Encounter (Signed)
[facility] calling questioning Daltiazem

## 2014-04-18 NOTE — Assessment & Plan Note (Signed)
We'll try diltiazem 120 mg a day with metoprolol 25 mg per day. Suggested he closely monitor his blood pressure. If he has lightheadedness or dizziness consistent with orthostasis, we may need to decrease the metoprolol dose

## 2014-04-18 NOTE — Patient Instructions (Addendum)
You are doing well.  Please stay on the metoprolol one pill twice a  Day Please take  diltiazem 30 mg pills only as needed for fast heart rate  Start the diltiazem 120 mg pills one a day  You have SVT: supraventricular tachycardia  If your heart rate runs in the 40s, call the office If you feel lightheaded when you stand, call the office  Please call us if you have new issues that need to be addressed before your next appt.  Your physician wants you to follow-up in: 1 month.

## 2014-04-18 NOTE — Assessment & Plan Note (Signed)
Previous symptoms of lightheadedness and dizziness likely secondary to his arrhythmia. These have improved on his medications

## 2014-05-02 ENCOUNTER — Telehealth: Payer: Self-pay

## 2014-05-02 NOTE — Telephone Encounter (Signed)
Dwayne from [facility] called w/ serious report for pt.  Reports sustained rate of 165 for 2 mins.  Asked him to upload to website so that I can print for Dr. Donivan Scull review.

## 2014-05-02 NOTE — Telephone Encounter (Signed)
Spoke w/ pt's wife.  She states that pt "must have some type of virus", as he has a low grade fever "a little over 100" and has been sleeping all day.  She reports that she offered to take pt to PCP today, but he did not want to go b/c he is tired.  Asked to speak w/ pt, but she states that he is currently on the phone w/ [facility].  Advised her that I will make Dr. Rockey Situ aware of pt's sx.

## 2014-05-04 NOTE — Telephone Encounter (Signed)
i have talked with patient Michael [name] monitor for now He is getting over a viral URI

## 2014-05-14 ENCOUNTER — Other Ambulatory Visit: Payer: Self-pay

## 2014-05-14 ENCOUNTER — Ambulatory Visit (INDEPENDENT_AMBULATORY_CARE_PROVIDER_SITE_OTHER): Payer: Medicare Other | Admitting: *Deleted

## 2014-05-14 DIAGNOSIS — I471 Supraventricular tachycardia: Secondary | ICD-10-CM

## 2014-05-14 DIAGNOSIS — R42 Dizziness and giddiness: Secondary | ICD-10-CM

## 2014-05-23 ENCOUNTER — Ambulatory Visit (INDEPENDENT_AMBULATORY_CARE_PROVIDER_SITE_OTHER): Payer: Medicare Other | Admitting: Cardiovascular Disease

## 2014-05-23 ENCOUNTER — Encounter: Payer: Self-pay | Admitting: Cardiovascular Disease

## 2014-05-23 ENCOUNTER — Other Ambulatory Visit (INDEPENDENT_AMBULATORY_CARE_PROVIDER_SITE_OTHER): Payer: Medicare Other

## 2014-05-23 VITALS — BP 118/64 | HR 154 | Ht 71.0 in | Wt 213.0 lb

## 2014-05-23 DIAGNOSIS — I498 Other specified cardiac arrhythmias: Secondary | ICD-10-CM

## 2014-05-23 DIAGNOSIS — I10 Essential (primary) hypertension: Secondary | ICD-10-CM

## 2014-05-23 DIAGNOSIS — R0602 Shortness of breath: Secondary | ICD-10-CM

## 2014-05-23 DIAGNOSIS — R42 Dizziness and giddiness: Secondary | ICD-10-CM

## 2014-05-23 DIAGNOSIS — I059 Rheumatic mitral valve disease, unspecified: Secondary | ICD-10-CM

## 2014-05-23 DIAGNOSIS — I471 Supraventricular tachycardia: Secondary | ICD-10-CM

## 2014-05-23 DIAGNOSIS — I5189 Other ill-defined heart diseases: Secondary | ICD-10-CM

## 2014-05-23 HISTORY — DX: Other ill-defined heart diseases: I51.89

## 2014-05-23 MED ORDER — FLECAINIDE ACETATE 100 MG PO TABS: 100.0000 mg | ORAL_TABLET | Freq: Two times a day (BID) | ORAL | Status: DC

## 2014-05-23 NOTE — Patient Instructions (Addendum)
You are having frequent episodes of SVT.  Please start flecainide one pill twice a day  We will make an appt with Dr. Rayann Heman:  Monday, Sept 14 @ 4:30pm Please arrive at 4:15.  Call [phone] if you are unable to keep this appt  We will perform an echocardiogram today for SVT.   Please call us if you have new issues that need to be addressed before your next appt.  Your physician wants you to follow-up in: 4 weeks

## 2014-05-23 NOTE — Progress Notes (Signed)
Patient ID: Michael [name], male    DOB: August 31, 1932, 80 y.o.   MRN: [id number]  HPI Comments: Michael [name] is a pleasant 80 year old gentleman with history of total knee replacement, bone hernia with prior repair renal dysfunction, who presents for followup of lightheadedness/dizziness, recent event monitor showing frequent episodes of SVT.  He presents today and reports not feeling well over the past day or so. He was working with his son out in the yard, reported having a sore throat, runny nose. Today reported waking up with a fast heart rhythm. EKG in the office showing SVT with rate 155 beats per minute Blood pressure was low, he was placed in a supine position and he broke to normal sinus rhythm shortly after.  We had a long discussion about his various treatment options for SVT. Exam was performed and he was in a normal sinus rhythm We are in the process of ordering an echocardiogram and arranging followup with EP when the wife came out of the room and reported that something was wrong. He was sitting on exam table, eyes rolled in the back of his head, appeared very pale and sweaty. Which exam showed is SVT had recurred. He was laid in a supine position, IV was placed for possible adenosine. Before this could be delivered, he broke to normal sinus rhythm.  By mouth fluids were given, wife was sent to pick up flecainide from the pharmacy so we could start this in the office, echocardiogram will be done in the office under supervision  Previous 30 day monitor  showed runs of SVT, heart rate up to 177 beats per minute associated with dizziness. Continued to have runs of SVT even on diltiazem 120 mg daily  Rarely he will have a fleeting hot episode that resolved on its own Reports developing a recurrent hernia recently  blood work showing BUN 20, creatinine 1.7. Baseline creatinine appears 1.4  EKG shows normal sinus rhythm with rate 156 beats per minute, nonspecific ST abnormality in the  anterolateral leads Followup EKG shows normal sinus rhythm with rate 86 beats per minute with PVCs, no significant ST or T wave changes   Outpatient Encounter Prescriptions as of 05/23/2014  Medication Sig  . aspirin 325 MG EC tablet Take 325 mg by mouth daily.  . Cholecalciferol (VITAMIN D3) 2000 UNITS TABS Take 1 tablet by mouth as needed.  . diltiazem (CARDIZEM CD) 120 MG 24 hr capsule Take 1 capsule (120 mg total) by mouth daily.  Marland Kitchen diltiazem (CARDIZEM) 30 MG tablet Take 1 tablet (30 mg total) by mouth 3 (three) times daily as needed.  Marland Kitchen HYDROcodone-acetaminophen (NORCO) 10-325 MG per tablet Take 1 tablet by mouth as needed.  . lovastatin (MEVACOR) 20 MG tablet TAKE ONE TABLET BY MOUTH ONCE DAILY  . Melatonin 10 MG TABS Take 1 tablet by mouth as needed.  . metoprolol tartrate (LOPRESSOR) 25 MG tablet Take 1 tablet (25 mg total) by mouth 2 (two) times daily.  Marland Kitchen omeprazole (PRILOSEC) 20 MG capsule TAKE 1 CAPSULE BY MOUTH EVERY DAY AS NEEDED.  Marland Kitchen oxyCODONE-acetaminophen (PERCOCET/ROXICET) 5-325 MG per tablet Take 1 tablet by mouth as needed for severe pain.  Marland Kitchen triamcinolone cream (KENALOG) 0.1 % Apply topically as needed.  . zolpidem (AMBIEN) 10 MG tablet TAKE 1 TABLET AT BEDTIME AS NEEDED FOR SLEEP    Review of Systems  Constitutional: Negative.        Episodes of sweating, chest and neck symptoms with nausea  HENT: Negative.  Eyes: Negative.   Respiratory: Negative.   Cardiovascular: Negative.        Tachycardia  Gastrointestinal: Negative.   Endocrine: Negative.   Musculoskeletal: Negative.   Skin: Negative.   Allergic/Immunologic: Negative.   Neurological: Positive for dizziness and light-headedness.  Hematological: Negative.   Psychiatric/Behavioral: Negative.   All other systems reviewed and are negative.   BP 118/64  Pulse 154  Ht 5\' 11"  (1.803 m)  Wt 213 lb (96.616 kg)  BMI 29.72 kg/m2  Physical Exam  Nursing note and vitals reviewed. Constitutional: He is  oriented to person, place, and time. He appears well-developed and well-nourished.  HENT:  Head: Normocephalic.  Nose: Nose normal.  Mouth/Throat: Oropharynx is clear and moist.  Eyes: Conjunctivae are normal. Pupils are equal, round, and reactive to light.  Neck: Normal range of motion. Neck supple. No JVD present.  Cardiovascular: Regular rhythm, S1 normal, S2 normal, normal heart sounds and intact distal pulses.  Tachycardia present.  Exam reveals no gallop and no friction rub.   No murmur heard. Pulmonary/Chest: Effort normal and breath sounds normal. No respiratory distress. He has no wheezes. He has no rales. He exhibits no tenderness.  Abdominal: Soft. Bowel sounds are normal. He exhibits no distension. There is no tenderness.  Musculoskeletal: Normal range of motion. He exhibits no edema and no tenderness.  Lymphadenopathy:    He has no cervical adenopathy.  Neurological: He is alert and oriented to person, place, and time. Coordination normal.  Skin: Skin is warm and dry. No rash noted. No erythema.  Psychiatric: He has a normal mood and affect. His behavior is normal. Judgment and thought content normal.      Assessment and Plan

## 2014-05-23 NOTE — Assessment & Plan Note (Signed)
Previous symptoms of malaise," not feeling well", lightheadedness and dizziness likely associated to his arrhythmia

## 2014-05-23 NOTE — Assessment & Plan Note (Signed)
We will continue him on his diltiazem 120 mg daily.

## 2014-05-23 NOTE — Telephone Encounter (Signed)
This encounter was created in error - please disregard.

## 2014-05-23 NOTE — Assessment & Plan Note (Addendum)
Frequent episodes of SVT despite taking diltiazem 120 mg daily and metoprolol 25 mg twice a day  Several episodes in the office today, last episode associated with near syncope, sweating, hypertension. Improved when placed supine and after rhythm broke.  IV placed but adenosine was not given Case discussed with Dr. Rayann Heman. In addition to his diltiazem, we will start flecainide 100 mg twice a day.  Followup arranged with [facility] EP with Dr. Rayann Heman on Monday, September 14 at 4:30 PM This was discussed with the patient. He is in favor of an ablation given the severity of his symptoms and the fact that he travels frequently out of the state.

## 2014-05-26 ENCOUNTER — Encounter: Payer: Self-pay | Admitting: Internal Medicine

## 2014-05-26 ENCOUNTER — Ambulatory Visit (INDEPENDENT_AMBULATORY_CARE_PROVIDER_SITE_OTHER): Payer: Medicare Other | Admitting: Family Medicine

## 2014-05-26 ENCOUNTER — Encounter: Payer: Self-pay | Admitting: Family Medicine

## 2014-05-26 ENCOUNTER — Ambulatory Visit (INDEPENDENT_AMBULATORY_CARE_PROVIDER_SITE_OTHER): Payer: Medicare Other | Admitting: Internal Medicine

## 2014-05-26 VITALS — BP 128/74 | HR 54 | Ht 70.0 in | Wt 219.0 lb

## 2014-05-26 VITALS — BP 120/62 | HR 69 | Temp 98.7°F | Ht 71.0 in | Wt 215.5 lb

## 2014-05-26 DIAGNOSIS — I471 Supraventricular tachycardia: Secondary | ICD-10-CM

## 2014-05-26 DIAGNOSIS — Z01812 Encounter for preprocedural laboratory examination: Secondary | ICD-10-CM

## 2014-05-26 DIAGNOSIS — I498 Other specified cardiac arrhythmias: Secondary | ICD-10-CM

## 2014-05-26 DIAGNOSIS — J189 Pneumonia, unspecified organism: Secondary | ICD-10-CM

## 2014-05-26 DIAGNOSIS — R233 Spontaneous ecchymoses: Secondary | ICD-10-CM

## 2014-05-26 MED ORDER — DOXYCYCLINE HYCLATE 100 MG PO CAPS: 100.0000 mg | ORAL_CAPSULE | Freq: Two times a day (BID) | ORAL | Status: DC

## 2014-05-26 MED ORDER — ASPIRIN 81 MG PO TBEC: 325.0000 mg | DELAYED_RELEASE_TABLET | Freq: Every day | ORAL | Status: DC

## 2014-05-26 NOTE — Progress Notes (Signed)
Dr. Karleen Hampshire T. Ayannah Faddis, MD, CAQ Sports [facility] [address] [place], [address] Phone: [phone] Fax: [phone]  05/26/2014  Patient: Michael [name], MRN: [id number], DOB: 1932/08/29, 80 y.o.  Primary Physician:  [name], MD  Chief Complaint: Cough and Nasal Congestion  Subjective:   Michael [name] is a 80 y.o. very pleasant male patient who presents with the following:  Pleasant gentleman with a history of SVT recently seen in the cardiologist office without a tobacco history and no history of COPD who presents with 3-4 days of significant "wheezing" and production of purulent sputum on cough.   Wheezing a lot. Having a hard time breathing - 3 or 4 days. Not sleeping well. Wheezing is the worst part. Never had a history of smoking.  Diffuse long fields involved. AF right now.   Past Medical History, Surgical History, Social History, Family History, Problem List, Medications, and Allergies have been reviewed and updated if relevant.  ROS: GEN: Acute illness details above GI: Tolerating PO intake GU: maintaining adequate hydration and urination Pulm: wheezing, cough Interactive and getting along well at home.  Otherwise, ROS is as per the HPI.   Objective:   BP 120/62  Pulse 69  Temp(Src) 98.7 F (37.1 C) (Oral)  Ht 5\' 11"  (1.803 m)  Wt 215 lb 8 oz (97.75 kg)  BMI 30.07 kg/m2  SpO2 95%   GEN: A and O x 3. WDWN. NAD.    ENT: Nose clear, ext NML.  No LAD.  No JVD.  TM's clear. Oropharynx clear.  PULM: Normal WOB, no distress. Diffuse rhonchorous BS. No wheezing.  CV: RRR, no M/G/R, No rubs, No JVD.   EXT: warm and well-perfused, No c/c/e. PSYCH: Pleasant and conversant.    Laboratory and Imaging Data:  Assessment and Plan:   Atypical pneumonia  Diffuse lung field involvement. I think you need to cover atypicals in this case.   Follow-up: No Follow-up on file.  New Prescriptions   DOXYCYCLINE  (VIBRAMYCIN) 100 MG CAPSULE    Take 1 capsule (100 mg total) by mouth 2 (two) times daily.   No orders of the defined types were placed in this encounter.    Signed,  Elpidio Galea. Yakira Duquette, MD   Patient's Medications  New Prescriptions   DOXYCYCLINE (VIBRAMYCIN) 100 MG CAPSULE    Take 1 capsule (100 mg total) by mouth 2 (two) times daily.  Previous Medications   ASPIRIN 325 MG EC TABLET    Take 325 mg by mouth daily.   CHOLECALCIFEROL (VITAMIN D3) 2000 UNITS TABS    Take 1 tablet by mouth as needed.   DILTIAZEM (CARDIZEM CD) 120 MG 24 HR CAPSULE    Take 1 capsule (120 mg total) by mouth daily.   DILTIAZEM (CARDIZEM) 30 MG TABLET    Take 1 tablet (30 mg total) by mouth 3 (three) times daily as needed.   FLECAINIDE (TAMBOCOR) 100 MG TABLET    Take 1 tablet (100 mg total) by mouth 2 (two) times daily.   HYDROCODONE-ACETAMINOPHEN (NORCO) 10-325 MG PER TABLET    Take 1 tablet by mouth as needed for severe pain.    LOVASTATIN (MEVACOR) 20 MG TABLET    TAKE ONE TABLET BY MOUTH ONCE DAILY   MELATONIN 10 MG TABS    Take 1 tablet by mouth as needed (sleep).    METOPROLOL TARTRATE (LOPRESSOR) 25 MG TABLET    Take 1 tablet (25 mg total) by mouth  2 (two) times daily.   OMEPRAZOLE (PRILOSEC) 20 MG CAPSULE    TAKE 1 CAPSULE BY MOUTH EVERY DAY AS NEEDED FOR HEARTBURN   OXYCODONE-ACETAMINOPHEN (PERCOCET/ROXICET) 5-325 MG PER TABLET    Take 1 tablet by mouth as needed for severe pain.   TRIAMCINOLONE CREAM (KENALOG) 0.1 %    Apply topically as needed (flare ups).    ZOLPIDEM (AMBIEN) 10 MG TABLET    TAKE 1 TABLET AT BEDTIME AS NEEDED FOR SLEEP  Modified Medications   No medications on file  Discontinued Medications   No medications on file

## 2014-05-26 NOTE — Patient Instructions (Signed)
Your physician has recommended you make the following change in your medication:  1) DECREASE Aspirin to 81mg  daily  See attached instruction sheet for ablation information.

## 2014-05-26 NOTE — Progress Notes (Signed)
Pre visit review using our clinic review tool, if applicable. No additional management support is needed unless otherwise documented below in the visit note. 

## 2014-05-26 NOTE — Progress Notes (Signed)
Primary Care Physician: Owens Loffler, MD Referring Physician:  Dr Neldon Newport is a 80 y.o. male with a h/o recently documented short RP SVT who presents today for EP consultation.  The patient reports short episodes of abrupt onset/ termination tachypalpitations for about 2 years.  Episodes have recently increased in both frequency and duration.  He is unaware of triggers/ precipitants.  He recently had an event monitor placed which documented multiple episodes of SVT.  He was in Dr Donivan Scull office this past Friday when he again had short RP SVT documented.  This tachycardia terminated with adenosine in the office.  The patient reports typical symptoms of palpitations, dizziness, and fatigue with episodes.  On Friday in Dr Donivan Scull office, he reports associated presyncope.  He was placed on diltiazem and flecainide.  He has done reasonably well since that time.  He has had URI symptoms for 4-5 days.  He presented to Dr Serita Grit office today and was diagnosed with atypical pneumonia.  He has been placed on oral antibiotics.  Today, he denies symptoms of chest pain, shortness of breath, orthopnea, PND, lower extremity edema, syncope, or neurologic sequela. The patient is tolerating medications without difficulties and is otherwise without complaint today.   Past Medical History  Diagnosis Date  . Colon cancer 1988  . Ventral hernia 1988  . HLD (hyperlipidemia)   . GERD (gastroesophageal reflux disease)   . Rotator cuff rupture     Right; s/p repair  . BPH (benign prostatic hypertrophy)   . Abnormality of gait   . Unspecified hearing loss   . Impotence of organic origin   . HTN (hypertension)   . Renal insufficiency     stones  . Arthritis   . Wears hearing aid     both ears  . Chronic back pain   . 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 HOH (hard of hearing)     bilateral hearing aids  . SVT (supraventricular tachycardia)    Past Surgical History  Procedure  Laterality Date  . Hemicoloectomy w/ anastomosis  [place]  . Ventral hernia repair  [facility]  . Rotator cuff repair      Right; [facility]  . Vein ligation and stripping      right  . Eye surgery      cataract extraction with IOL bilaterally  . Hernia repair  1963    inguinal  . Rotator cuff repair      right  . Total knee arthroplasty  03/05/2012    Procedure: TOTAL KNEE ARTHROPLASTY;  Surgeon: Gearlean Alf, MD;  Location: [facility];  Service: Orthopedics;  Laterality: Left;  . Colon surgery      colon cancer  . Mastectomy, partial Left 02/07/2013    Procedure: LEFT SUBCUTANEOUS MASTECTOMY;  Surgeon: Imogene Burn. Georgette Dover, MD;  Location: [place];  Service: General;  Laterality: Left;    Current Outpatient Prescriptions  Medication Sig Dispense Refill  . aspirin 81 MG EC tablet Take 4 tablets (325 mg total) by mouth daily.      . Cholecalciferol (VITAMIN D3) 2000 UNITS TABS Take 1 tablet by mouth as needed.      . diltiazem (CARDIZEM CD) 120 MG 24 hr capsule Take 1 capsule (120 mg total) by mouth daily.  30 capsule  6  . doxycycline (VIBRAMYCIN) 100 MG capsule Take 1 capsule (100 mg total) by mouth 2 (two) times  daily.  20 capsule  0  . flecainide (TAMBOCOR) 100 MG tablet Take 1 tablet (100 mg total) by mouth 2 (two) times daily.  60 tablet  6  . HYDROcodone-acetaminophen (NORCO) 10-325 MG per tablet Take 1 tablet by mouth as needed for severe pain.       Marland Kitchen lovastatin (MEVACOR) 20 MG tablet TAKE ONE TABLET BY MOUTH ONCE DAILY  90 tablet  0  . Melatonin 10 MG TABS Take 1 tablet by mouth as needed (sleep).       . metoprolol tartrate (LOPRESSOR) 25 MG tablet Take 1 tablet (25 mg total) by mouth 2 (two) times daily.  60 tablet  3  . omeprazole (PRILOSEC) 20 MG capsule TAKE 1 CAPSULE BY MOUTH EVERY DAY AS NEEDED FOR HEARTBURN      . oxyCODONE-acetaminophen (PERCOCET/ROXICET) 5-325 MG per tablet Take 1 tablet by mouth as needed for severe pain.       Marland Kitchen triamcinolone cream (KENALOG) 0.1 % Apply topically as needed (flare ups).       . zolpidem (AMBIEN) 10 MG tablet TAKE 1 TABLET AT BEDTIME AS NEEDED FOR SLEEP  30 tablet  5  . diltiazem (CARDIZEM) 30 MG tablet Take 1 tablet (30 mg total) by mouth 3 (three) times daily as needed.  90 tablet  4   No current facility-administered medications for this visit.    No Known Allergies  History   Social History  . Marital Status: Married    Spouse Name: N/A    Number of Children: 3  . Years of Education: college   Occupational History  . Retired    Social History Main Topics  . Smoking status: Never Smoker   . Smokeless tobacco: Never Used  . Alcohol Use: Yes     Comment: Wine (1/2-1 glass 2x/week  . Drug Use: No  . Sexual Activity: Not on file   Other Topics Concern  . 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 Osteoarthritis Father   . Congestive Heart Failure Mother     ROS- All systems are reviewed and negative except as per the HPI above  Physical Exam: Filed Vitals:   05/26/14 1636  BP: 128/74  Pulse: 54  Height: 5\' 10"  (1.778 m)  Weight: 219 lb (99.338 kg)  SpO2: 95%    GEN- The patient is elderly but pleasant appearing, alert and oriented x 3 today.   Head- normocephalic, atraumatic Eyes-  Sclera clear, conjunctiva pink Ears- poor hearing Oropharynx- clear Neck- supple, no JVP Lymph- no cervical lymphadenopathy Lungs- Coarse BS at the bases bilaterally with expiratory wheezes noted, normal work of breathing Heart- Regular rate and rhythm, no murmurs, rubs or gallops, PMI not laterally displaced GI- soft, NT, ND, + BS Extremities- no clubbing, cyanosis, or edema MS-  no significant deformity or atrophy Skin- ecchymosis on forearms bilaterally Psych- euthymic mood, full affect Neuro- strength and sensation are intact  EKG 05/23/14 reveals short RP svt at 156 bpm Recent event monitor is reviewed and reveals multiple episodes of sustained  SVT   Epic records including Dr Gwenyth Ober recent notes are reviewed  Assessment and Plan:  1. SVT The patient has symptomatic recurrent short RP SVT.  His SVT is adenosine sensitive and likely represents a reentrant arrhythmia. He has been placed on diltiazem and flecainide and appears to be doing a little better since his recent visit with Dr Rockey Situ. Therapeutic strategies for supraventricular tachycardia including medicine and ablation were discussed in detail with the patient today. Risk, benefits, and alternatives to EP study and radiofrequency ablation were also discussed in detail today. These risks include but are not limited to stroke, bleeding, vascular damage, tamponade, perforation, damage to the heart and other structures, AV block requiring pacemaker, worsening renal function, and death. The patient understands these risk and wishes to proceed.  We will therefore proceed with catheter ablation at the next available time.  I will schedule the procedure with [facility] and anesthesia. Though I would like to proceed with ablation quickly, he has been diagnosed with pneumonia.  I will therefore allow several weeks to recover before we proceed.  2. Ecchymosis He is taking ASA 325mg  daily.  I do not see clear indication for this.  I have decreased asa to 81mg  daily today.  I suspect that he could stop this but will defer to Dr Rockey Situ

## 2014-05-27 ENCOUNTER — Encounter: Payer: Self-pay | Admitting: Cardiovascular Disease

## 2014-06-09 ENCOUNTER — Telehealth: Payer: Self-pay | Admitting: Internal Medicine

## 2014-06-09 ENCOUNTER — Other Ambulatory Visit (INDEPENDENT_AMBULATORY_CARE_PROVIDER_SITE_OTHER): Payer: Medicare Other

## 2014-06-09 DIAGNOSIS — N183 Chronic kidney disease, stage 3 unspecified: Secondary | ICD-10-CM

## 2014-06-09 DIAGNOSIS — I10 Essential (primary) hypertension: Secondary | ICD-10-CM

## 2014-06-09 DIAGNOSIS — N138 Other obstructive and reflux uropathy: Secondary | ICD-10-CM

## 2014-06-09 DIAGNOSIS — E785 Hyperlipidemia, unspecified: Secondary | ICD-10-CM

## 2014-06-09 DIAGNOSIS — N401 Enlarged prostate with lower urinary tract symptoms: Secondary | ICD-10-CM

## 2014-06-09 LAB — COMPREHENSIVE METABOLIC PANEL
ALK PHOS: 61 U/L (ref 39–117)
ALT: 26 U/L (ref 0–53)
AST: 29 U/L (ref 0–37)
Albumin: 4.1 g/dL (ref 3.5–5.2)
BUN: 24 mg/dL — AB (ref 6–23)
CO2: 32 mEq/L (ref 19–32)
Calcium: 9.1 mg/dL (ref 8.4–10.5)
Chloride: 99 mEq/L (ref 96–112)
Creatinine, Ser: 1.4 mg/dL (ref 0.4–1.5)
GFR: 50.55 mL/min — ABNORMAL LOW (ref >60.00)
Glucose, Bld: 92 mg/dL (ref 70–99)
POTASSIUM: 4.4 meq/L (ref 3.5–5.1)
Sodium: 136 mEq/L (ref 135–145)
Total Bilirubin: 0.9 mg/dL (ref 0.2–1.2)
Total Protein: 7 g/dL (ref 6.0–8.3)

## 2014-06-09 LAB — CBC WITH DIFFERENTIAL/PLATELET
Basophils Absolute: 0 10*3/uL (ref 0.0–0.1)
Basophils Relative: 0.5 % (ref 0.0–3.0)
EOS PCT: 3.4 % (ref 0.0–5.0)
Eosinophils Absolute: 0.2 10*3/uL (ref 0.0–0.7)
HEMATOCRIT: 42.3 % (ref 39.0–52.0)
Hemoglobin: 14.1 g/dL (ref 13.0–17.0)
Lymphocytes Relative: 18.9 % (ref 12.0–46.0)
Lymphs Abs: 1.3 10*3/uL (ref 0.7–4.0)
MCHC: 33.2 g/dL (ref 30.0–36.0)
MCV: 97.1 fl (ref 78.0–100.0)
Monocytes Absolute: 0.8 10*3/uL (ref 0.1–1.0)
Monocytes Relative: 10.9 % (ref 3.0–12.0)
NEUTROS ABS: 4.6 10*3/uL (ref 1.4–7.7)
Neutrophils Relative %: 66.3 % (ref 43.0–77.0)
Platelets: 244 10*3/uL (ref 150.0–400.0)
RBC: 4.36 Mil/uL (ref 4.22–5.81)
RDW: 13.8 % (ref 11.5–15.5)
WBC: 7 10*3/uL (ref 4.0–10.5)

## 2014-06-09 LAB — TSH: TSH: 2.36 u[IU]/mL (ref 0.35–4.50)

## 2014-06-09 LAB — LIPID PANEL
CHOL/HDL RATIO: 3
Cholesterol: 152 mg/dL (ref 0–200)
HDL: 54.7 mg/dL (ref >39.00)
LDL CALC: 81 mg/dL (ref 0–99)
NonHDL: 97.3
TRIGLYCERIDES: 84 mg/dL (ref 0.0–149.0)
VLDL: 16.8 mg/dL (ref 0.0–40.0)

## 2014-06-09 LAB — PSA, MEDICARE: PSA: 1.11 ng/ml (ref 0.10–4.00)

## 2014-06-09 NOTE — Telephone Encounter (Signed)
New message          Pt has pneumonia and needs to cancel procedure for 10/8

## 2014-06-09 NOTE — Telephone Encounter (Signed)
Claiborne Billings, please follow-up with the patient to reschedule.  I will forward this to Dr Rockey Situ to update him also.

## 2014-06-09 NOTE — Telephone Encounter (Signed)
Pt called to [facility] an appointment for ablation on 06/19/14 with Dr Rayann Heman, because he has bronchial pneumonia.  Pt will call back to rescheduled the procedure when he gets better. Michael [name] at the cath  lab aware. This message is being send to  MD kelly and Dr Jackalyn Lombard to make them aware of scheduled changes.

## 2014-06-12 ENCOUNTER — Encounter: Payer: Self-pay | Admitting: Family Medicine

## 2014-06-12 ENCOUNTER — Other Ambulatory Visit: Payer: Medicare Other

## 2014-06-12 ENCOUNTER — Ambulatory Visit (INDEPENDENT_AMBULATORY_CARE_PROVIDER_SITE_OTHER)
Admission: RE | Admit: 2014-06-12 | Discharge: 2014-06-12 | Disposition: A | Discharge status: Home / Self Care | Payer: Medicare Other | Source: Ambulatory Visit | Attending: Family Medicine | Admitting: Family Medicine

## 2014-06-12 ENCOUNTER — Ambulatory Visit (INDEPENDENT_AMBULATORY_CARE_PROVIDER_SITE_OTHER): Payer: Medicare Other | Admitting: Family Medicine

## 2014-06-12 VITALS — BP 132/75 | HR 50 | Temp 98.4°F | Ht 68.75 in | Wt 211.5 lb

## 2014-06-12 DIAGNOSIS — E785 Hyperlipidemia, unspecified: Secondary | ICD-10-CM

## 2014-06-12 DIAGNOSIS — Z0000 Encounter for general adult medical examination without abnormal findings: Secondary | ICD-10-CM

## 2014-06-12 DIAGNOSIS — R062 Wheezing: Secondary | ICD-10-CM

## 2014-06-12 DIAGNOSIS — I471 Supraventricular tachycardia, unspecified: Secondary | ICD-10-CM

## 2014-06-12 DIAGNOSIS — N183 Chronic kidney disease, stage 3 unspecified: Secondary | ICD-10-CM

## 2014-06-12 DIAGNOSIS — R059 Cough, unspecified: Secondary | ICD-10-CM

## 2014-06-12 DIAGNOSIS — R05 Cough: Secondary | ICD-10-CM

## 2014-06-12 DIAGNOSIS — I10 Essential (primary) hypertension: Secondary | ICD-10-CM

## 2014-06-12 NOTE — Progress Notes (Signed)
Dr. Karleen Hampshire T. Jacolby Risby, MD, CAQ Sports Medicine Primary Care and Sports Medicine [address] [place], [address] Phone: [phone] Fax: [phone]  06/12/2014  Patient: Michael [name], MRN: [id number], DOB: 03/22/1932, 80 y.o.  Primary Physician:  [name], MD  Chief Complaint: Annual Exam  Subjective:   Michael [name] is a 80 y.o. pleasant patient who presents for a medicare wellness examination:  Preventative Health Maintenance Visit as well as ongoing acute issues.  Health Maintenance Summary Reviewed and updated, unless pt declines services.  Prevnar-13 Zostavax Flu - declines vaccines today, wants to wait until cough has resolved.   SVT / EP Procedure, Dr. Johney Frame - was scheduled to have an ablation next week but he cancelled it.  Had very symptomatic SVT episode recently in the cardiology office and was placed on Flecainide by Dr. Mariah Milling. Since then, he has been feeling reall lightheadedness or fainting spells. He feels the best he has felt the best he has a long time. He would like to know if he could be maintained on her medical regiment rather than a procedural one.  I previously saw him on 05/26/2014, and I thought that he likely had atypical PNA and placed him on Doxy. He is feeling somewhat better, but he is still coughing up a lot of phlegm. Most of the time and still feels like he is wheezing.  HTN: Tolerating all medications without side effects Stable and at goal No CP, no sob. No HA.  BP Readings from Last 3 Encounters:  06/12/14 132/75  05/26/14 128/74  05/26/14 120/62    Basic Metabolic Panel:    Component Value Date/Time   NA 136 06/09/2014 1010   K 4.4 06/09/2014 1010   CL 99 06/09/2014 1010   CO2 32 06/09/2014 1010   BUN 24* 06/09/2014 1010   CREATININE 1.4 06/09/2014 1010   GLUCOSE 92 06/09/2014 1010   CALCIUM 9.1 06/09/2014 1010   Lipids: Doing well, stable. Tolerating meds fine with no SE. Panel reviewed with  patient.  Lipids:    Component Value Date/Time   CHOL 152 06/09/2014 1010   TRIG 84.0 06/09/2014 1010   HDL 54.70 06/09/2014 1010   LDLDIRECT 75.4 05/03/2011 1603   VLDL 16.8 06/09/2014 1010   CHOLHDL 3 06/09/2014 1010    Lab Results  Component Value Date   ALT 26 06/09/2014   AST 29 06/09/2014   ALKPHOS 61 06/09/2014   BILITOT 0.9 06/09/2014    Tobacco History Reviewed. Alcohol: No concerns, no excessive use Exercise Habits: Some activity, limited some now with joint pains STD concerns: no risk or activity to increase risk Drug Use: None Encouraged self-testicular check  Health Maintenance  Topic Date Due  . Tetanus/tdap  03/26/1953  . Zostavax  03/26/1994  . Colonoscopy  01/08/2014  . Influenza Vaccine  04/12/2014  . Pneumococcal Polysaccharide Vaccine Age 8 And Over  Completed    Immunization History  Administered Date(s) Administered  . Pneumococcal Polysaccharide-23 02/25/2013    Patient Active Problem List   Diagnosis Date Noted  . SVT (supraventricular tachycardia) 04/18/2014  . 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 Impotence of organic origin   . Orthostatic hypotension 05/10/2012  . OA (osteoarthritis) of knee 03/05/2012  . Essential hypertension 01/08/2010  . BENIGN PROSTATIC HYPERTROPHY, WITH URINARY OBSTRUCTION 01/07/2010  . HEARING LOSS, UNSPEC. 12/08/2008  . GAIT DISTURBANCE 12/08/2008  . Hyperlipidemia LDL goal <100 07/23/2008  . GERD 07/23/2008  . CKD (chronic kidney disease) stage  3, GFR 30-59 ml/min 07/23/2008  . Colon cancer 07/22/2008   Past Medical History  Diagnosis Date  . Colon cancer 1988  . Ventral hernia 1988  . HLD (hyperlipidemia)   . GERD (gastroesophageal reflux disease)   . Rotator cuff rupture     Right; s/p repair  . BPH (benign prostatic hypertrophy)   . Abnormality of gait   . Unspecified hearing loss   . Impotence of organic origin   . HTN (hypertension)   . Renal insufficiency     stones  . Arthritis   . Wears  hearing aid     both ears  . Chronic back pain   . 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 HOH (hard of hearing)     bilateral hearing aids  . SVT (supraventricular tachycardia)    Past Surgical History  Procedure Laterality Date  . Hemicoloectomy w/ anastomosis  1988    [name]  . Ventral hernia repair  [facility]  . Rotator cuff repair      Right; [facility]  . Vein ligation and stripping      right  . Eye surgery      cataract extraction with IOL bilaterally  . Hernia repair  1963    inguinal  . Rotator cuff repair      right  . Total knee arthroplasty  03/05/2012    Procedure: TOTAL KNEE ARTHROPLASTY;  Surgeon: Loanne Drilling, MD;  Location: [facility];  Service: Orthopedics;  Laterality: Left;  . Colon surgery      colon cancer  . Mastectomy, partial Left 02/07/2013    Procedure: LEFT SUBCUTANEOUS MASTECTOMY;  Surgeon: Wilmon Arms. Corliss Skains, MD;  Location: [facility];  Service: General;  Laterality: Left;   History   Social History  . Marital Status: Married    Spouse Name: N/A    Number of Children: 3  . Years of Education: college   Occupational History  . Retired    Social History Main Topics  . Smoking status: Never Smoker   . Smokeless tobacco: Never Used  . Alcohol Use: Yes     Comment: Wine (1/2-1 glass 2x/week  . Drug Use: No  . Sexual Activity: Not on file   Other Topics Concern  . 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 Osteoarthritis Father   . Congestive Heart Failure Mother    No Known Allergies  Medication list has been reviewed and updated.   General: Denies fever, chills, sweats. No significant weight loss. Eyes: Denies  blurring,significant itching ENT: Denies earache, sore throat, and hoarseness. Cardiovascular: AS ABOVE, FEELING MUCH BETTER, NO PALPITATIONS SINCE ON FLECAINIDE Respiratory: AS ABOVE Breast: no concerns about lumps GI: Denies nausea, vomiting, diarrhea, constipation, change in bowel habits, abdominal pain, melena, hematochezia GU: Denies penile discharge, ED, urinary flow / outflow problems. No STD concerns. Musculoskeletal: Denies back pain, joint pain Derm: Denies rash, itching Neuro: Denies  paresthesias, frequent falls, frequent headaches Psych: Denies depression, anxiety Endocrine: Denies cold intolerance,  heat intolerance, polydipsia Heme: Denies enlarged lymph nodes Allergy: No hayfever  Objective:   BP 132/75  Pulse 50  Temp(Src) 98.4 F (36.9 C) (Oral)  Ht 5' 8.75" (1.746 m)  Wt 211 lb 8 oz (95.936 kg)  BMI 31.47 kg/m2  SpO2 96%  The patient completed a fall screen and PHQ-2 and PHQ-9 if necessary, which is documented in the EHR. The CMA/LPN/RN who assisted the patient verbally completed with them and documented results in [facility] link EHR.  Hearing Screening Comments: Wears Bilateral Hearing Aides Vision Screening Comments: Had yearly eye exam 09/2013.  GEN: well developed, well nourished, no acute distress Eyes: conjunctiva and lids normal, PERRLA, EOMI ENT: TM clear, nares clear, oral exam WNL Neck: supple, no lymphadenopathy, no thyromegaly, no JVD Pulm: very rare, faint wheeze, respiratory effort normal CV: regular rate and rhythm, S1-S2, no murmur, rub or gallop, no bruits, peripheral pulses normal and symmetric, no cyanosis, clubbing, edema or varicosities GI: soft, non-tender; no hepatosplenomegaly, masses; active bowel sounds all quadrants GU: no hernia, testicular mass, penile discharge Lymph: no cervical, axillary or inguinal adenopathy MSK: gait normal, muscle tone and strength WNL, no joint swelling, effusions, discoloration, crepitus  SKIN: clear,  good turgor, color WNL, no rashes, lesions, or ulcerations Neuro: normal mental status, normal strength, sensation, and motion Psych: alert; oriented to person, place and time, normally interactive and not anxious or depressed in appearance.  All labs reviewed with patient.  Lipids:    Component Value Date/Time   CHOL 152 06/09/2014 1010   TRIG 84.0 06/09/2014 1010   HDL 54.70 06/09/2014 1010   LDLDIRECT 75.4 05/03/2011 1603   VLDL 16.8 06/09/2014 1010   CHOLHDL 3 06/09/2014 1010   CBC: CBC Latest Ref Rng 06/09/2014 02/05/2014 02/25/2013  WBC 4.0 - 10.5 K/uL 7.0 8.3 6.5  Hemoglobin 13.0 - 17.0 g/dL 33.2 95.1 88.4  Hematocrit 39.0 - 52.0 % 42.3 42.1 41.0  Platelets 150.0 - 400.0 K/uL 244.0 203.0 212.0    Basic Metabolic Panel:    Component Value Date/Time   NA 136 06/09/2014 1010   K 4.4 06/09/2014 1010   CL 99 06/09/2014 1010   CO2 32 06/09/2014 1010   BUN 24* 06/09/2014 1010   CREATININE 1.4 06/09/2014 1010   GLUCOSE 92 06/09/2014 1010   CALCIUM 9.1 06/09/2014 1010   Hepatic Function Latest Ref Rng 06/09/2014 02/05/2014 02/25/2013  Total Protein 6.0 - 8.3 g/dL 7.0 6.9 6.7  Albumin 3.5 - 5.2 g/dL 4.1 4.3 4.2  AST 0 - 37 U/L 29 27 19   ALT 0 - 53 U/L 26 22 20   Alk Phosphatase 39 - 117 U/L 61 57 68  Total Bilirubin 0.2 - 1.2 mg/dL 0.9 0.8 0.6  Bilirubin, Direct 0.0 - 0.3 mg/dL - 0.1 0.1    Lab Results  Component Value Date   TSH 2.36 06/09/2014   Lab Results  Component Value Date   PSA 1.11 06/09/2014   PSA 2.21 02/25/2013   PSA 13.96* 01/07/2010   Dg Chest 2 View  06/12/2014   CLINICAL DATA:  Six weeks cough.  EXAM: CHEST  2 VIEW  COMPARISON:  02/23/2012.  FINDINGS: Mediastinum and hilar structures are normal. Lungs are clear. No pleural effusion or pneumothorax. Heart size normal. No acute bony abnormality. Degenerative changes thoracic spine.  IMPRESSION: No active cardiopulmonary disease.   Electronically Signed   By: Maisie Fus  Register   On: 06/12/2014 16:27    Assessment and Plan:    Health maintenance examination: as  below. F/u nurse visit for vaccines when feeling better.   Cough - Plan: DG Chest 2 View. S/p 10 days of doxy, clear chest x-ray. This may all be post-infectious cough. No additional ABX should be needed. No history of smoking or asthma. Albuterol inhaler prn for now.   Wheezing  CKD (chronic kidney disease) stage 3, GFR 30-59 ml/min. Cr is 1.4 right now.  Hyperlipidemia LDL goal <100: stable and at goal  Essential hypertension: Stable  SVT (supraventricular tachycardia): This is a more challenging question, and I really need to defer to my Cardiology colleagues. He cancelled his ablation, and he would like to talk more with his cardiologist about continuing on Flecainide and cardizem. This is a reasonable conversation to have, and I recommended that he talk about this with Dr. Mariah Milling. Symptomatic from frequent SVT captured on event monitor. He had a major event described on recent cardiologist office visit and in the office.  Health Maintenance Exam: The patient's preventative maintenance and recommended screening tests for an annual wellness exam were reviewed in full today. Brought up to date unless services declined.  Counselled on the importance of diet, exercise, and its role in overall health and mortality. The patient's FH and SH was reviewed, including their home life, tobacco status, and drug and alcohol status.  I have personally reviewed the Medicare Annual Wellness questionnaire and have noted 1. The patient's medical and social history 2. Their use of alcohol, tobacco or illicit drugs 3. Their current medications and supplements 4. The patient's functional ability including ADL's, fall risks, home safety risks and hearing or visual             impairment. 5. Diet and physical activities 6. Evidence for depression or mood disorders  The patients weight, height, BMI and visual acuity have been recorded in the chart I have made  referrals, counseling and provided education to the patient based review of the above and I have provided the pt with a written personalized care plan for preventive services.  I have provided the patient with a copy of your personalized plan for preventive services. Instructed to take the time to review along with their updated medication list.  Follow-up: No Follow-up on file. Or follow-up in 1 year for complete physical examination  New Prescriptions   ALBUTEROL (PROAIR HFA) 108 (90 BASE) MCG/ACT INHALER    Inhale 2 puffs into the lungs every 4 (four) hours as needed for wheezing or shortness of breath.   Orders Placed This Encounter  Procedures  . DG Chest 2 View    Signed,  Karleen Hampshire T. Januel Doolan, MD   Patient's Medications  New Prescriptions   ALBUTEROL (PROAIR HFA) 108 (90 BASE) MCG/ACT INHALER    Inhale 2 puffs into the lungs every 4 (four) hours as needed for wheezing or shortness of breath.  Previous Medications   ASPIRIN 81 MG EC TABLET    Take 4 tablets (325 mg total) by mouth daily.   CHOLECALCIFEROL (VITAMIN D3) 2000 UNITS TABS    Take 1 tablet by mouth as needed.   DILTIAZEM (CARDIZEM CD) 120 MG 24 HR CAPSULE    Take 1 capsule (120 mg total) by mouth daily.   DILTIAZEM (CARDIZEM) 30 MG TABLET    Take 1 tablet (30 mg total) by mouth 3 (three) times daily as needed.   DOXYCYCLINE (VIBRAMYCIN) 100 MG CAPSULE    Take 1 capsule (100 mg total) by mouth 2 (two) times daily.   FLECAINIDE (TAMBOCOR) 100 MG  TABLET    Take 1 tablet (100 mg total) by mouth 2 (two) times daily.   HYDROCODONE-ACETAMINOPHEN (NORCO) 10-325 MG PER TABLET    Take 1 tablet by mouth as needed for severe pain.    LOVASTATIN (MEVACOR) 20 MG TABLET    TAKE ONE TABLET BY MOUTH ONCE DAILY   MELATONIN 10 MG TABS    Take 1 tablet by mouth as needed (sleep).    METOPROLOL TARTRATE (LOPRESSOR) 25 MG TABLET    Take 1 tablet (25 mg total) by mouth 2 (two) times daily.   OMEPRAZOLE (PRILOSEC) 20 MG CAPSULE    TAKE 1  CAPSULE BY MOUTH EVERY DAY AS NEEDED FOR HEARTBURN   OXYCODONE-ACETAMINOPHEN (PERCOCET/ROXICET) 5-325 MG PER TABLET    Take 1 tablet by mouth as needed for severe pain.   TRIAMCINOLONE CREAM (KENALOG) 0.1 %    Apply topically as needed (flare ups).    ZOLPIDEM (AMBIEN) 10 MG TABLET    TAKE 1 TABLET AT BEDTIME AS NEEDED FOR SLEEP  Modified Medications   No medications on file  Discontinued Medications   No medications on file

## 2014-06-12 NOTE — Progress Notes (Signed)
Pre visit review using our clinic review tool, if applicable. No additional management support is needed unless otherwise documented below in the visit note. 

## 2014-06-13 MED ORDER — ALBUTEROL SULFATE HFA 108 (90 BASE) MCG/ACT IN AERS: 2.0000 | INHALATION_SPRAY | RESPIRATORY_TRACT | Status: DC | PRN

## 2014-06-19 ENCOUNTER — Ambulatory Visit (HOSPITAL_COMMUNITY): Admission: RE | Admit: 2014-06-19 | Payer: Medicare Other | Source: Ambulatory Visit | Admitting: Internal Medicine

## 2014-06-19 ENCOUNTER — Encounter (HOSPITAL_COMMUNITY): Admission: RE | Payer: Self-pay | Source: Ambulatory Visit

## 2014-06-19 ENCOUNTER — Telehealth: Payer: Self-pay

## 2014-06-19 SURGERY — SUPRAVENTRICULAR TACHYCARDIA ABLATION
Anesthesia: Monitor Anesthesia Care

## 2014-06-19 MED ORDER — AMOXICILLIN 500 MG PO CAPS: 1000.0000 mg | ORAL_CAPSULE | Freq: Two times a day (BID) | ORAL | Status: DC

## 2014-06-19 NOTE — Telephone Encounter (Signed)
Michael [name] pts daughter in law said pt was seen on 06/12/14 and symptoms were improving until 06/18/14. Pt has prod cough with dark yellow thick phlegm. Pt is not wheezing and does not have fever. Does pt need to be rechecked or is there a med can be sent to [facility]? Pt request cb.

## 2014-06-19 NOTE — Telephone Encounter (Signed)
Michael [name] notified prescription for an antibiotic has been sent to [facility].

## 2014-06-19 NOTE — Telephone Encounter (Signed)
I saw him last week. I am ok sending in another round of abx for him.  Amoxicillin 500 mg, 2 tabs po bid, #40, 0 ref

## 2014-06-25 ENCOUNTER — Ambulatory Visit: Payer: Medicare Other | Admitting: Cardiovascular Disease

## 2014-06-27 ENCOUNTER — Other Ambulatory Visit: Payer: Self-pay

## 2014-07-04 ENCOUNTER — Other Ambulatory Visit: Payer: Self-pay | Admitting: Family Medicine

## 2014-07-10 ENCOUNTER — Ambulatory Visit (INDEPENDENT_AMBULATORY_CARE_PROVIDER_SITE_OTHER): Payer: Medicare Other

## 2014-07-10 DIAGNOSIS — I471 Supraventricular tachycardia: Secondary | ICD-10-CM

## 2014-08-05 ENCOUNTER — Telehealth: Payer: Self-pay | Admitting: Family Medicine

## 2014-08-05 ENCOUNTER — Ambulatory Visit (INDEPENDENT_AMBULATORY_CARE_PROVIDER_SITE_OTHER): Payer: Medicare Other | Admitting: Family Medicine

## 2014-08-05 ENCOUNTER — Encounter: Payer: Self-pay | Admitting: Family Medicine

## 2014-08-05 ENCOUNTER — Other Ambulatory Visit: Payer: Self-pay | Admitting: *Deleted

## 2014-08-05 VITALS — BP 110/60 | HR 55 | Temp 98.2°F | Ht 68.75 in | Wt 221.0 lb

## 2014-08-05 DIAGNOSIS — N529 Male erectile dysfunction, unspecified: Secondary | ICD-10-CM

## 2014-08-05 DIAGNOSIS — N528 Other male erectile dysfunction: Secondary | ICD-10-CM

## 2014-08-05 DIAGNOSIS — M1711 Unilateral primary osteoarthritis, right knee: Secondary | ICD-10-CM

## 2014-08-05 DIAGNOSIS — G4700 Insomnia, unspecified: Secondary | ICD-10-CM

## 2014-08-05 DIAGNOSIS — M25561 Pain in right knee: Secondary | ICD-10-CM

## 2014-08-05 MED ORDER — TRAZODONE HCL 50 MG PO TABS: 25.0000 mg | ORAL_TABLET | Freq: Every evening | ORAL | Status: DC | PRN

## 2014-08-05 MED ORDER — SILDENAFIL CITRATE 20 MG PO TABS: ORAL_TABLET | ORAL | Status: DC

## 2014-08-05 MED ORDER — HYDROCODONE-ACETAMINOPHEN 10-325 MG PO TABS: 1.0000 | ORAL_TABLET | ORAL | Status: DC | PRN

## 2014-08-05 MED ORDER — METHYLPREDNISOLONE ACETATE 40 MG/ML IJ SUSP
80.0000 mg | Freq: Once | INTRAMUSCULAR | Status: AC
  Administered 2014-08-05: 80 mg via INTRA_ARTICULAR

## 2014-08-05 NOTE — Progress Notes (Signed)
Pre visit review using our clinic review tool, if applicable. No additional management support is needed unless otherwise documented below in the visit note. 

## 2014-08-05 NOTE — Progress Notes (Signed)
Dr. Karleen Hampshire T. Sayda Grable, MD, CAQ Sports Medicine Primary Care and Sports Medicine [address] [place], [address] Phone: [phone] Fax: [phone]  08/05/2014  Patient: Michael [name], MRN: [id number], DOB: 03-27-1932, 80 y.o.  Primary Physician:  [name], MD  Chief Complaint: Knee Pain  Subjective:   Michael [name] is a 80 y.o. very pleasant male patient who presents with the following:  Right knee:  Elderly gentleman presents with multiple problems.  RIGHT knee with known severe osteoarthritis, status post LEFT knee total knee arthroplasty done by Dr. Merlyn Albert.  He has been starting to get more and more pain in the RIGHT knee and he currently has a large effusion.  He is intermittently had pain, and has had these a cane.  He has not had any current particular injury that he can think of.  He has had the use of strong opioid medication, and has required some Percocet tens recently.  He also has had intermittent insomnia, which we have treated with Ambien in the past.  Upon discussion with the patient's spouse and cost, where going to change to a different medication.  He also has some interest in trying some Viagra.  He does have some organic impotence.  Was ready to start walking and will sometimes get a middle of the night pain and will get a sharp pain in it and had to use cane. Since then, it has been getting better every day. Taking some opioids twice a day.   Has been takng some percocet 10.   Aspirate knee. And inject  Past Medical History, Surgical History, Social History, Family History, Problem List, Medications, and Allergies have been reviewed and updated if relevant.  GEN: No fevers, chills. Nontoxic. Primarily MSK c/o today. MSK: Detailed in the HPI GI: tolerating PO intake without difficulty Neuro: No numbness, parasthesias, or tingling associated. Otherwise the pertinent positives of the ROS are noted above.   Objective:   BP 110/60 mmHg   Pulse 55  Temp(Src) 98.2 F (36.8 C) (Oral)  Ht 5' 8.75" (1.746 m)  Wt 221 lb (100.245 kg)  BMI 32.88 kg/m2   GEN: WDWN, NAD, Non-toxic, Alert & Oriented x 3 HEENT: Atraumatic, Normocephalic.  Ears and Nose: No external deformity. EXTR: No clubbing/cyanosis/edema NEURO: Normal gait.  PSYCH: Normally interactive. Conversant. Not depressed or anxious appearing.  Calm demeanor.   RIGHT knee: The patient lacks 3 of extension.  Flexion to 110.  Pain on the medial and lateral joint line.  Medial collateral ligament and lateral collateral ligament are stable.  Anterior cruciate ligament appears stable.  Pain with flexion.  McMurray's also causes pain.   Radiology: No results found.  Assessment and Plan:   Primary osteoarthritis of right knee  Right knee pain - Plan: methylPREDNISolone acetate (DEPO-MEDROL) injection 80 mg  Impotence of organic origin  Insomnia  Discontinue Ambien and start trial of trazodone when necessary for sleep.  Generic sildenafil when necessary for intercourse.  RIGHT knee, known severe osteoarthritic changes.  I would rather keep the patient off of opioids.  He is very hard of hearing.  Recommended aspiration and injection of the patient's knee today in the office.  Knee Aspiration and Injection, RIGHT Patient verbally consented; risks, benefits, and alternatives explained including possible infection. Patient prepped with Chloraprep. Ethyl chloride for anesthesia. 10 cc of 1% Lidocaine used in wheal then injected Subcutaneous fashion with 22 gauge needle on lateral approach. Under sterilne conditions, 18 gauge needle used via lateral  approach to aspirate 50 cc of yellowish synovial fluid. Then 8 cc of Lidocaine 1% and Depo-Medrol 80 mg injected. Tolerated well, decreased pain, no complications.   Follow-up: No Follow-up on file.  New Prescriptions   SILDENAFIL (REVATIO) 20 MG TABLET    Generic Revatio / Sildanefil 20 mg. 2 - 5 tabs 30 mins prior to  intercourse.   TRAZODONE (DESYREL) 50 MG TABLET    Take 0.5-1 tablets (25-50 mg total) by mouth at bedtime as needed for sleep.   No orders of the defined types were placed in this encounter.    Signed,  Elpidio Galea. Kazaria Gaertner, MD   Patient's Medications  New Prescriptions   SILDENAFIL (REVATIO) 20 MG TABLET    Generic Revatio / Sildanefil 20 mg. 2 - 5 tabs 30 mins prior to intercourse.   TRAZODONE (DESYREL) 50 MG TABLET    Take 0.5-1 tablets (25-50 mg total) by mouth at bedtime as needed for sleep.  Previous Medications   ASPIRIN 81 MG EC TABLET    Take 4 tablets (325 mg total) by mouth daily.   CHOLECALCIFEROL (VITAMIN D3) 2000 UNITS TABS    Take 1 tablet by mouth as needed.   DILTIAZEM (CARDIZEM CD) 120 MG 24 HR CAPSULE    Take 1 capsule (120 mg total) by mouth daily.   FLECAINIDE (TAMBOCOR) 100 MG TABLET    Take 1 tablet (100 mg total) by mouth 2 (two) times daily.   LOVASTATIN (MEVACOR) 20 MG TABLET    Take 1 tablet (20 mg total) by mouth daily.   MELATONIN 10 MG TABS    Take 1 tablet by mouth as needed (sleep).    METOPROLOL TARTRATE (LOPRESSOR) 25 MG TABLET    Take 1 tablet (25 mg total) by mouth 2 (two) times daily.   OMEPRAZOLE (PRILOSEC) 20 MG CAPSULE    TAKE 1 CAPSULE BY MOUTH EVERY DAY AS NEEDED FOR HEARTBURN   TRIAMCINOLONE CREAM (KENALOG) 0.1 %    Apply topically as needed (flare ups).   Modified Medications   Modified Medication Previous Medication   HYDROCODONE-ACETAMINOPHEN (NORCO) 10-325 MG PER TABLET HYDROcodone-acetaminophen (NORCO) 10-325 MG per tablet      Take 1 tablet by mouth as needed for severe pain.    Take 1 tablet by mouth as needed for severe pain.   Discontinued Medications   ALBUTEROL (PROAIR HFA) 108 (90 BASE) MCG/ACT INHALER    Inhale 2 puffs into the lungs every 4 (four) hours as needed for wheezing or shortness of breath.   AMOXICILLIN (AMOXIL) 500 MG CAPSULE    Take 2 capsules (1,000 mg total) by mouth 2 (two) times daily.   DILTIAZEM (CARDIZEM) 30 MG  TABLET    Take 1 tablet (30 mg total) by mouth 3 (three) times daily as needed.   DOXYCYCLINE (VIBRAMYCIN) 100 MG CAPSULE    Take 1 capsule (100 mg total) by mouth 2 (two) times daily.   OXYCODONE-ACETAMINOPHEN (PERCOCET/ROXICET) 5-325 MG PER TABLET    Take 1 tablet by mouth as needed for severe pain.   ZOLPIDEM (AMBIEN) 10 MG TABLET    TAKE 1 TABLET AT BEDTIME AS NEEDED FOR SLEEP

## 2014-08-05 NOTE — Telephone Encounter (Signed)
Pt needs hydrocodone refilled  Pt's wife is here to pick it up  Says it was not given at today's appt and Dr Lorelei Pont is no longer here   Px printed and signed

## 2014-08-05 NOTE — Telephone Encounter (Signed)
Michael [name] said they were suppose to get a prescription today for Norco.  Please advise.

## 2014-08-05 NOTE — Telephone Encounter (Signed)
i apologize - i forgot to print for him.

## 2014-08-06 NOTE — Telephone Encounter (Signed)
Dr. Glori Bickers refilled medication for Michael [name] on 08/05/2014, since Dr. Lorelei Pont had already left for the day.

## 2014-08-13 ENCOUNTER — Encounter: Payer: Self-pay | Admitting: Cardiovascular Disease

## 2014-08-13 ENCOUNTER — Ambulatory Visit (INDEPENDENT_AMBULATORY_CARE_PROVIDER_SITE_OTHER): Payer: Medicare Other | Admitting: Cardiovascular Disease

## 2014-08-13 VITALS — BP 130/70 | HR 55 | Ht 70.0 in | Wt 226.3 lb

## 2014-08-13 DIAGNOSIS — I951 Orthostatic hypotension: Secondary | ICD-10-CM

## 2014-08-13 DIAGNOSIS — I471 Supraventricular tachycardia: Secondary | ICD-10-CM

## 2014-08-13 NOTE — Assessment & Plan Note (Signed)
He denies having frequent episodes of SVT, possibly one episode since his last office visit associated with dizziness. He reports it was relatively short-lived. At this time he is inclined not to have the ablation though would like to continue to monitor his symptoms. Recommended he stay on his current medication regiment despite his desire to wean off some of the medications. We have suggested if he would like to come off the medications, that he proceed with the ablation. He'll call us for recurrent episodes, keep a diary

## 2014-08-13 NOTE — Progress Notes (Signed)
Patient ID: Michael [name], male    DOB: [birth year], 80 y.o.   MRN: [id number]  HPI Comments: Michael [name] is a pleasant 80 year old gentleman with history of total knee replacement,  renal dysfunction, who presents for followup of lightheadedness/dizziness,frequent episodes of SVT. He was recently seen by Dr. Rayann Heman and plan was made for SVT ablation. He developed bronchial pneumonia and procedure was delayed  In follow-up today, he reports having only one episode of dizziness concerning for recurrent SVT. Otherwise he reports that he has been feeling relatively well. He tracks his pulse on a regular basis. Ideally would like to come off some of the medications. Denies having any side effects on the flecainide, diltiazem, metoprolol He has not been active as before since the pneumonia. He would like to restart his activities, exercise program  EKG shows normal sinus rhythm with rate 55 bpm, no significant ST or T-wave changes  Other past medical history Previous 30 day monitor  showed runs of SVT, heart rate up to 177 beats per minute associated with dizziness. Continued to have runs of SVT even on diltiazem 120 mg daily   blood work showing BUN 20, creatinine 1.7. Baseline creatinine appears 1.4    Outpatient Encounter Prescriptions as of 08/13/2014  Medication Sig  . aspirin 81 MG EC tablet Take 4 tablets (325 mg total) by mouth daily.  . Cholecalciferol (VITAMIN D3) 2000 UNITS TABS Take 1 tablet by mouth as needed.  . diltiazem (CARDIZEM CD) 120 MG 24 hr capsule Take 1 capsule (120 mg total) by mouth daily.  . flecainide (TAMBOCOR) 100 MG tablet Take 1 tablet (100 mg total) by mouth 2 (two) times daily.  Marland Kitchen HYDROcodone-acetaminophen (NORCO) 10-325 MG per tablet Take 1 tablet by mouth as needed for severe pain.  Marland Kitchen lovastatin (MEVACOR) 20 MG tablet Take 1 tablet (20 mg total) by mouth daily.  . metoprolol tartrate (LOPRESSOR) 25 MG tablet Take 1 tablet (25 mg total) by mouth 2 (two) times  daily.  Marland Kitchen omeprazole (PRILOSEC) 20 MG capsule TAKE 1 CAPSULE BY MOUTH EVERY DAY AS NEEDED FOR HEARTBURN  . sildenafil (REVATIO) 20 MG tablet Generic Revatio / Sildanefil 20 mg. 2 - 5 tabs 30 mins prior to intercourse.  . traZODone (DESYREL) 50 MG tablet Take 0.5-1 tablets (25-50 mg total) by mouth at bedtime as needed for sleep.  Marland Kitchen triamcinolone cream (KENALOG) 0.1 % Apply topically as needed (flare ups).   . [DISCONTINUED] Melatonin 10 MG TABS Take 1 tablet by mouth as needed (sleep).    Social history  reports that he has never smoked. He has never used smokeless tobacco. He reports that he drinks alcohol. He reports that he does not use illicit drugs.  Review of Systems  Constitutional: Negative.   Eyes: Negative.   Respiratory: Negative.   Cardiovascular: Negative.        Tachycardia  Gastrointestinal: Negative.   Musculoskeletal: Negative.   Neurological: Positive for dizziness.  Hematological: Negative.   Psychiatric/Behavioral: Negative.   All other systems reviewed and are negative.    BP 130/70 mmHg  Pulse 55  Ht 5\' 10"  (1.778 m)  Wt 226 lb 5 oz (102.655 kg)  BMI 32.47 kg/m2  Physical Exam  Constitutional: He is oriented to person, place, and time. He appears well-developed and well-nourished.  HENT:  Head: Normocephalic.  Nose: Nose normal.  Mouth/Throat: Oropharynx is clear and moist.  Eyes: Conjunctivae are normal. Pupils are equal, round, and reactive to light.  Neck: Normal range  of motion. Neck supple. No JVD present.  Cardiovascular: Normal rate, regular rhythm, S1 normal, S2 normal, normal heart sounds and intact distal pulses.  Exam reveals no gallop and no friction rub.   No murmur heard. Pulmonary/Chest: Effort normal and breath sounds normal. No respiratory distress. He has no wheezes. He has no rales. He exhibits no tenderness.  Abdominal: Soft. Bowel sounds are normal. He exhibits no distension. There is no tenderness.  Musculoskeletal: Normal range  of motion. He exhibits no edema or tenderness.  Lymphadenopathy:    He has no cervical adenopathy.  Neurological: He is alert and oriented to person, place, and time. Coordination normal.  Skin: Skin is warm and dry. No rash noted. No erythema.  Psychiatric: He has a normal mood and affect. His behavior is normal. Judgment and thought content normal.      Assessment and Plan   Nursing note and vitals reviewed.

## 2014-08-13 NOTE — Patient Instructions (Signed)
You are doing well. No medication changes were made.  Please call us if you have new issues that need to be addressed before your next appt.  Your physician wants you to follow-up in: 6 months.  You will receive a reminder letter in the mail two months in advance. If you don't receive a letter, please call our office to schedule the follow-up appointment.   

## 2014-08-13 NOTE — Assessment & Plan Note (Signed)
In the setting of SVT, he has orthostatic hypotension. This leads to dizziness. Only one episode recently

## 2014-08-15 ENCOUNTER — Other Ambulatory Visit: Payer: Self-pay | Admitting: *Deleted

## 2014-08-15 MED ORDER — METOPROLOL TARTRATE 25 MG PO TABS: 25.0000 mg | ORAL_TABLET | Freq: Two times a day (BID) | ORAL | Status: DC

## 2014-09-13 ENCOUNTER — Encounter: Payer: Self-pay | Admitting: Family Medicine

## 2014-10-08 DIAGNOSIS — H35033 Hypertensive retinopathy, bilateral: Secondary | ICD-10-CM | POA: Diagnosis not present

## 2014-10-08 DIAGNOSIS — H52223 Regular astigmatism, bilateral: Secondary | ICD-10-CM | POA: Diagnosis not present

## 2014-10-08 DIAGNOSIS — Z9849 Cataract extraction status, unspecified eye: Secondary | ICD-10-CM | POA: Diagnosis not present

## 2014-10-08 LAB — HM DIABETES EYE EXAM

## 2014-10-10 DIAGNOSIS — M4806 Spinal stenosis, lumbar region: Secondary | ICD-10-CM | POA: Diagnosis not present

## 2014-10-15 ENCOUNTER — Encounter: Payer: Self-pay | Admitting: Family Medicine

## 2014-10-15 ENCOUNTER — Other Ambulatory Visit: Payer: Self-pay | Admitting: Physical Medicine and Rehabilitation

## 2014-10-15 ENCOUNTER — Other Ambulatory Visit: Payer: Self-pay | Admitting: *Deleted

## 2014-10-15 DIAGNOSIS — M545 Low back pain, unspecified: Secondary | ICD-10-CM

## 2014-10-15 DIAGNOSIS — G8929 Other chronic pain: Secondary | ICD-10-CM

## 2014-10-15 MED ORDER — DILTIAZEM HCL ER COATED BEADS 120 MG PO CP24: 120.0000 mg | ORAL_CAPSULE | Freq: Every day | ORAL | Status: DC

## 2014-10-25 DIAGNOSIS — R042 Hemoptysis: Secondary | ICD-10-CM | POA: Diagnosis not present

## 2014-10-25 DIAGNOSIS — E785 Hyperlipidemia, unspecified: Secondary | ICD-10-CM | POA: Diagnosis not present

## 2014-10-25 DIAGNOSIS — J9601 Acute respiratory failure with hypoxia: Secondary | ICD-10-CM | POA: Diagnosis not present

## 2014-10-25 DIAGNOSIS — M25461 Effusion, right knee: Secondary | ICD-10-CM | POA: Diagnosis not present

## 2014-10-25 DIAGNOSIS — R918 Other nonspecific abnormal finding of lung field: Secondary | ICD-10-CM | POA: Diagnosis not present

## 2014-10-25 DIAGNOSIS — R600 Localized edema: Secondary | ICD-10-CM | POA: Diagnosis not present

## 2014-10-25 DIAGNOSIS — K920 Hematemesis: Secondary | ICD-10-CM | POA: Diagnosis not present

## 2014-10-25 DIAGNOSIS — M1990 Unspecified osteoarthritis, unspecified site: Secondary | ICD-10-CM | POA: Diagnosis not present

## 2014-10-25 DIAGNOSIS — I10 Essential (primary) hypertension: Secondary | ICD-10-CM | POA: Diagnosis not present

## 2014-10-25 DIAGNOSIS — E861 Hypovolemia: Secondary | ICD-10-CM | POA: Diagnosis not present

## 2014-10-25 DIAGNOSIS — N179 Acute kidney failure, unspecified: Secondary | ICD-10-CM | POA: Diagnosis not present

## 2014-10-25 DIAGNOSIS — Z85038 Personal history of other malignant neoplasm of large intestine: Secondary | ICD-10-CM | POA: Diagnosis not present

## 2014-10-25 DIAGNOSIS — R58 Hemorrhage, not elsewhere classified: Secondary | ICD-10-CM | POA: Diagnosis not present

## 2014-10-25 DIAGNOSIS — D539 Nutritional anemia, unspecified: Secondary | ICD-10-CM | POA: Diagnosis not present

## 2014-10-25 DIAGNOSIS — N281 Cyst of kidney, acquired: Secondary | ICD-10-CM | POA: Diagnosis not present

## 2014-10-25 DIAGNOSIS — R312 Other microscopic hematuria: Secondary | ICD-10-CM | POA: Diagnosis not present

## 2014-10-25 DIAGNOSIS — J9691 Respiratory failure, unspecified with hypoxia: Secondary | ICD-10-CM | POA: Diagnosis not present

## 2014-10-25 DIAGNOSIS — Z0389 Encounter for observation for other suspected diseases and conditions ruled out: Secondary | ICD-10-CM | POA: Diagnosis not present

## 2014-10-25 DIAGNOSIS — R000 Tachycardia, unspecified: Secondary | ICD-10-CM | POA: Diagnosis not present

## 2014-10-25 DIAGNOSIS — K219 Gastro-esophageal reflux disease without esophagitis: Secondary | ICD-10-CM | POA: Diagnosis not present

## 2014-10-25 DIAGNOSIS — Z96652 Presence of left artificial knee joint: Secondary | ICD-10-CM | POA: Diagnosis not present

## 2014-10-25 DIAGNOSIS — R531 Weakness: Secondary | ICD-10-CM | POA: Diagnosis not present

## 2014-10-25 DIAGNOSIS — J189 Pneumonia, unspecified organism: Secondary | ICD-10-CM | POA: Diagnosis not present

## 2014-10-25 DIAGNOSIS — I272 Other secondary pulmonary hypertension: Secondary | ICD-10-CM | POA: Diagnosis not present

## 2014-10-25 DIAGNOSIS — R0602 Shortness of breath: Secondary | ICD-10-CM | POA: Diagnosis not present

## 2014-10-25 DIAGNOSIS — M1711 Unilateral primary osteoarthritis, right knee: Secondary | ICD-10-CM | POA: Diagnosis not present

## 2014-10-25 DIAGNOSIS — I471 Supraventricular tachycardia: Secondary | ICD-10-CM | POA: Diagnosis not present

## 2014-10-27 ENCOUNTER — Telehealth: Payer: Self-pay | Admitting: Family Medicine

## 2014-10-27 NOTE — Telephone Encounter (Signed)
Wife called in and husband was sent by ambulance in [place] to the [facility] (hospital).  He has been admitted.  Wife says that husbands creatine and BUN were low and has questions about that.  Husband was seen by Dr Anastasio Auerbach.  The phone number to hospital is [phone] and the fax number is [phone].  Wife is not in the area at the time, she is still in [place].

## 2014-10-28 NOTE — Telephone Encounter (Signed)
Let me know if there is anything they need

## 2014-10-28 NOTE — Telephone Encounter (Signed)
Michael [name] notified as instructed by telephone.  She states they are down if [place] with friends and on Saturday Michael [name] started coughing up blood.  She would like his most recent labs (06/09/2014) faxed to Dr. Anastasio Auerbach at [phone].  Labs faxed as requested.

## 2014-10-28 NOTE — Telephone Encounter (Signed)
Let me know if there is anything they need.

## 2014-10-28 NOTE — Telephone Encounter (Signed)
Please let them know I am out of the country. His baseline creatinine was 1.4 the last time we checked it. (normal for age) It has been slightly elevated in the past.   If there is anything they specifically need, can you help? If complex question, Dr. B might offer input. He has generally been pretty healthy.   Mr. Sotomayor is Mrs. Lynden Ang father-in-law.  Electronically Signed  By: Owens Loffler, MD On: 10/28/2014 7:40 AM

## 2014-10-29 ENCOUNTER — Telehealth: Payer: Self-pay | Admitting: *Deleted

## 2014-10-29 NOTE — Telephone Encounter (Signed)
Pt is hospitalized in [place] due to coughing up blood.  Dr. Horris Latino would like to speak w/ Dr. Rockey Situ re pt's flecainide.

## 2014-10-29 NOTE — Telephone Encounter (Signed)
Please call Dr. Horris Latino [phone] re: Rx Flecanide.

## 2014-10-29 NOTE — Telephone Encounter (Signed)
Dr. Horris Latino calling back to speak with Dr. Rockey Situ

## 2014-11-14 ENCOUNTER — Encounter: Payer: Self-pay | Admitting: Gastroenterology

## 2014-12-11 ENCOUNTER — Ambulatory Visit (INDEPENDENT_AMBULATORY_CARE_PROVIDER_SITE_OTHER): Payer: Medicare Other | Admitting: Cardiovascular Disease

## 2014-12-11 ENCOUNTER — Encounter: Payer: Self-pay | Admitting: Cardiovascular Disease

## 2014-12-11 VITALS — BP 132/72 | HR 50 | Ht 70.0 in | Wt 225.2 lb

## 2014-12-11 DIAGNOSIS — E785 Hyperlipidemia, unspecified: Secondary | ICD-10-CM | POA: Diagnosis not present

## 2014-12-11 DIAGNOSIS — R5383 Other fatigue: Secondary | ICD-10-CM | POA: Insufficient documentation

## 2014-12-11 DIAGNOSIS — G933 Postviral fatigue syndrome: Secondary | ICD-10-CM

## 2014-12-11 DIAGNOSIS — I471 Supraventricular tachycardia: Secondary | ICD-10-CM | POA: Diagnosis not present

## 2014-12-11 DIAGNOSIS — I10 Essential (primary) hypertension: Secondary | ICD-10-CM

## 2014-12-11 DIAGNOSIS — G9331 Postviral fatigue syndrome: Secondary | ICD-10-CM

## 2014-12-11 NOTE — Progress Notes (Signed)
Patient ID: Michael [name], male    DOB: Jul 26, 1932, 81 y.o.   MRN: [id number]  HPI Comments: [name] is a pleasant 81 year old gentleman with history of total knee replacement,  renal dysfunction, who presents for followup of lightheadedness/dizziness,frequent episodes of SVT. He was recently seen by Dr. Rayann Heman and plan was made for SVT ablation. He developed bronchial pneumonia and procedure was delayed He presents today for follow-up of his SVT  He reports that he was recently traveling out of town and developed shortness of breath, vomiting blood, presented to [facility] clinic and was diagnosed with pneumonia, kept in the hospital for 6 days, He reports having some arrhythmia on one night but is unclear of the details. Sent home on Levaquin Currently feels better with no respiratory distress but feels very tired. He is not doing any regular exercise. Relatively sedentary at baseline  He reports that he was taking 2 of his medications in the morning and 2 at night when he was supposed to take 1 and 1. He is not sure which medication it was, possibly metoprolol, unable to exclude flecainide. Denies having any SVT symptoms, no near syncope or syncope  EKG on today's visit shows normal sinus rhythm with rate 50 bpm, no significant ST or T-wave changes  Other past medical history Previous 30 day monitor  showed runs of SVT, heart rate up to 177 beats per minute associated with dizziness. Continued to have runs of SVT even on diltiazem 120 mg daily   blood work showing BUN 20, creatinine 1.7. Baseline creatinine appears 1.4    No Known Allergies  Outpatient Encounter Prescriptions as of 12/11/2014  Medication Sig  . aspirin 81 MG EC tablet Take 4 tablets (325 mg total) by mouth daily. (Patient taking differently: Take 162 mg by mouth daily. )  . calcium carbonate (OS-CAL) 600 MG TABS tablet Take 600 mg by mouth daily with breakfast.  . diltiazem (CARDIZEM CD) 120 MG 24 hr capsule  Take 1 capsule (120 mg total) by mouth daily.  . flecainide (TAMBOCOR) 100 MG tablet Take 1 tablet (100 mg total) by mouth 2 (two) times daily.  Marland Kitchen lovastatin (MEVACOR) 20 MG tablet Take 1 tablet (20 mg total) by mouth daily.  . metoprolol tartrate (LOPRESSOR) 25 MG tablet Take 1 tablet (25 mg total) by mouth 2 (two) times daily.  Marland Kitchen omeprazole (PRILOSEC) 20 MG capsule TAKE 1 CAPSULE BY MOUTH EVERY DAY AS NEEDED FOR HEARTBURN  . oxyCODONE-acetaminophen (PERCOCET) 10-325 MG per tablet Take 1 tablet by mouth every 4 (four) hours as needed.   . sildenafil (REVATIO) 20 MG tablet Generic Revatio / Sildanefil 20 mg. 2 - 5 tabs 30 mins prior to intercourse.  . traZODone (DESYREL) 50 MG tablet Take 0.5-1 tablets (25-50 mg total) by mouth at bedtime as needed for sleep.  Marland Kitchen triamcinolone cream (KENALOG) 0.1 % Apply topically as needed (flare ups).   . [DISCONTINUED] Cholecalciferol (VITAMIN D3) 2000 UNITS TABS Take 1 tablet by mouth as needed.  . [DISCONTINUED] HYDROcodone-acetaminophen (NORCO) 10-325 MG per tablet Take 1 tablet by mouth as needed for severe pain. (Patient not taking: Reported on 12/11/2014)    Past Medical History  Diagnosis Date  . Colon cancer 1988  . Ventral hernia 1988  . HLD (hyperlipidemia)   . GERD (gastroesophageal reflux disease)   . Rotator cuff rupture     Right; s/p repair  . BPH (benign prostatic hypertrophy)   . Abnormality of gait   . Unspecified hearing  loss   . Impotence of organic origin   . HTN (hypertension)   . Renal insufficiency     stones  . Arthritis   . Wears hearing aid     both ears  . Chronic back pain   . 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 HOH (hard of hearing)     bilateral hearing aids  . SVT (supraventricular tachycardia)     Past Surgical History  Procedure Laterality Date  . Hemicoloectomy w/ anastomosis  [place]  . Ventral hernia repair  [facility]  . Rotator cuff repair      Right; [facility]  .  Vein ligation and stripping      right  . Eye surgery      cataract extraction with IOL bilaterally  . Hernia repair  1963    inguinal  . Rotator cuff repair      right  . Total knee arthroplasty  03/05/2012    Procedure: TOTAL KNEE ARTHROPLASTY;  Surgeon: Gearlean Alf, MD;  Location: [facility];  Service: Orthopedics;  Laterality: Left;  . Colon surgery      colon cancer  . Mastectomy, partial Left 02/07/2013    Procedure: LEFT SUBCUTANEOUS MASTECTOMY;  Surgeon: Imogene Burn. Georgette Dover, MD;  Location: [place];  Service: General;  Laterality: Left;    Social History  reports that he has never smoked. He has never used smokeless tobacco. He reports that he drinks alcohol. He reports that he does not use illicit drugs.  Family History family history includes Congestive Heart Failure in his mother; Osteoarthritis in his father.   Review of Systems  Constitutional: Positive for fatigue.  Eyes: Negative.   Respiratory: Negative.   Cardiovascular: Negative.        Tachycardia  Gastrointestinal: Negative.   Musculoskeletal: Negative.   Neurological: Negative.   Hematological: Negative.   Psychiatric/Behavioral: Negative.   All other systems reviewed and are negative.   BP 132/72 mmHg  Pulse 50  Ht 5\' 10"  (1.778 m)  Wt 225 lb 4 oz (102.173 kg)  BMI 32.32 kg/m2  Physical Exam  Constitutional: He is oriented to person, place, and time. He appears well-developed and well-nourished.  HENT:  Head: Normocephalic.  Nose: Nose normal.  Mouth/Throat: Oropharynx is clear and moist.  Eyes: Conjunctivae are normal. Pupils are equal, round, and reactive to light.  Neck: Normal range of motion. Neck supple. No JVD present.  Cardiovascular: Normal rate, regular rhythm, S1 normal, S2 normal, normal heart sounds and intact distal pulses.  Exam reveals no gallop and no friction rub.   No murmur heard. Pulmonary/Chest: Effort normal and breath sounds normal. No respiratory distress.  He has no wheezes. He has no rales. He exhibits no tenderness.  Abdominal: Soft. Bowel sounds are normal. He exhibits no distension. There is no tenderness.  Musculoskeletal: Normal range of motion. He exhibits no edema or tenderness.  Lymphadenopathy:    He has no cervical adenopathy.  Neurological: He is alert and oriented to person, place, and time. Coordination normal.  Skin: Skin is warm and dry. No rash noted. No erythema.  Psychiatric: He has a normal mood and affect. His behavior is normal. Judgment and thought content normal.      Assessment and Plan   Nursing note and vitals reviewed.

## 2014-12-11 NOTE — Assessment & Plan Note (Signed)
Recommended he stay on metoprolol 25 mg twice a day, flecainide 100 mg twice a day, diltiazem daily. Suggested he call us within name of the pill he was taking 2 of in the morning and evening If symptoms get worse concerning his SVT, recommended he call us

## 2014-12-11 NOTE — Assessment & Plan Note (Signed)
Cholesterol is at goal on the current lipid regimen. No changes to the medications were made.  

## 2014-12-11 NOTE — Assessment & Plan Note (Signed)
Blood pressure is well controlled on today's visit. No changes made to the medications. 

## 2014-12-11 NOTE — Patient Instructions (Signed)
You are doing well.  Please check the medication that you are doubling Please take flecainide one pill twice a day Please take metoprolol one pill twice a day  Please call us if you have new issues that need to be addressed before your next appt.  Your physician wants you to follow-up in: 6 months.  You will receive a reminder letter in the mail two months in advance. If you don't receive a letter, please call our office to schedule the follow-up appointment.

## 2014-12-11 NOTE — Assessment & Plan Note (Signed)
Recent hospital admission for pneumonia. Still has not recovered yet. Recommended he start a regular exercise program

## 2014-12-15 ENCOUNTER — Other Ambulatory Visit: Payer: Self-pay | Admitting: Family Medicine

## 2014-12-22 ENCOUNTER — Encounter: Payer: Self-pay | Admitting: Family Medicine

## 2014-12-22 ENCOUNTER — Ambulatory Visit (INDEPENDENT_AMBULATORY_CARE_PROVIDER_SITE_OTHER): Payer: Medicare Other | Admitting: Family Medicine

## 2014-12-22 VITALS — BP 100/60 | HR 70 | Temp 97.9°F | Ht 70.0 in | Wt 224.5 lb

## 2014-12-22 DIAGNOSIS — N183 Chronic kidney disease, stage 3 unspecified: Secondary | ICD-10-CM

## 2014-12-22 DIAGNOSIS — Z79899 Other long term (current) drug therapy: Secondary | ICD-10-CM | POA: Diagnosis not present

## 2014-12-22 DIAGNOSIS — J189 Pneumonia, unspecified organism: Secondary | ICD-10-CM | POA: Diagnosis not present

## 2014-12-22 DIAGNOSIS — Z23 Encounter for immunization: Secondary | ICD-10-CM

## 2014-12-22 DIAGNOSIS — R042 Hemoptysis: Secondary | ICD-10-CM | POA: Diagnosis not present

## 2014-12-22 LAB — CBC WITH DIFFERENTIAL/PLATELET
Basophils Absolute: 0 10*3/uL (ref 0.0–0.1)
Basophils Relative: 0.6 % (ref 0.0–3.0)
EOS ABS: 0.2 10*3/uL (ref 0.0–0.7)
EOS PCT: 3.2 % (ref 0.0–5.0)
HCT: 38.1 % — ABNORMAL LOW (ref 39.0–52.0)
Hemoglobin: 12.9 g/dL — ABNORMAL LOW (ref 13.0–17.0)
LYMPHS PCT: 20.8 % (ref 12.0–46.0)
Lymphs Abs: 1.2 10*3/uL (ref 0.7–4.0)
MCHC: 33.7 g/dL (ref 30.0–36.0)
MCV: 94.5 fl (ref 78.0–100.0)
MONO ABS: 0.9 10*3/uL (ref 0.1–1.0)
Monocytes Relative: 15.1 % — ABNORMAL HIGH (ref 3.0–12.0)
NEUTROS PCT: 60.3 % (ref 43.0–77.0)
Neutro Abs: 3.5 10*3/uL (ref 1.4–7.7)
Platelets: 211 10*3/uL (ref 150.0–400.0)
RBC: 4.04 Mil/uL — ABNORMAL LOW (ref 4.22–5.81)
RDW: 13.4 % (ref 11.5–15.5)
WBC: 5.9 10*3/uL (ref 4.0–10.5)

## 2014-12-22 LAB — HEPATIC FUNCTION PANEL
ALT: 38 U/L (ref 0–53)
AST: 43 U/L — ABNORMAL HIGH (ref 0–37)
Albumin: 3.9 g/dL (ref 3.5–5.2)
Alkaline Phosphatase: 58 U/L (ref 39–117)
Bilirubin, Direct: 0.1 mg/dL (ref 0.0–0.3)
TOTAL PROTEIN: 7.1 g/dL (ref 6.0–8.3)
Total Bilirubin: 0.5 mg/dL (ref 0.2–1.2)

## 2014-12-22 LAB — BASIC METABOLIC PANEL
BUN: 30 mg/dL — AB (ref 6–23)
CALCIUM: 9.3 mg/dL (ref 8.4–10.5)
CHLORIDE: 99 meq/L (ref 96–112)
CO2: 32 meq/L (ref 19–32)
CREATININE: 1.55 mg/dL — AB (ref 0.40–1.50)
GFR: 46 mL/min — ABNORMAL LOW (ref >60.00)
GLUCOSE: 72 mg/dL (ref 70–99)
Potassium: 4.4 mEq/L (ref 3.5–5.1)
Sodium: 135 mEq/L (ref 135–145)

## 2014-12-22 NOTE — Progress Notes (Signed)
Dr. Karleen Hampshire T. Raquel Racey, MD, CAQ Sports [facility] Sports Medicine [address] [place], [address] Phone: [phone] Fax: [phone]  12/22/2014  Patient: Michael [name], MRN: [id number], DOB: 23-Sep-1931, 81 y.o.  Primary Physician:  [name], MD  Chief Complaint: Hospitalization Follow-up  Subjective:   Michael [name] is a 81 y.o. very pleasant male patient who presents with the following:  Patient with remote h/o colon cancer and CKD stage 3, arthritis, and SVT on flecainide, B-blockade, and Ca-channel blocker presents almost 2 months post-hosp in [place].   The patient was hospitalized at the [facility] in [place].  Hospitalization dates per documentation are 2/13 - 11/02/2014.  The patient presented to the emergency room in [place] acutely ill, hypoxic to the 70s per the patient's wife, and having a large amount of hemoptysis.  He was found to have a bilateral pneumonia as well as the ongoing hemoptysis.  He also developed some worsening renal function while he was in the hospital.  This improved by time of discharge.  I have the admission note as well as the discharge summary but do not have the entirety of the hospitalization.  The patient is very hard of hearing, and his wife supplements history.  He did very poorly for 3 days, and he was on what sounds like BiPAP due to his hypoxia.  He had been getting IV antibiotics the entire time he was there in the hospital.  It appears as if he got IV Zithromax and IV Zosyn, and was discharged on Levaquin.  He also had an NG tube for 3 days.  Since then, the patient still has felt relatively poorly, and his energy level is decreased.  He feels fatigued.  Hemoptysis. 70's pulse ox.  Always tired. No energy. Gained weight.   Immunization History  Administered Date(s) Administered  . Pneumococcal Polysaccharide-23 02/25/2013    -- prevnar-13.   Past Medical History, Surgical History, Social  History, Family History, Problem List, Medications, and Allergies have been reviewed and updated if relevant.  GEN: No acute illnesses, no fevers, chills currently. GI: No n/v/d, eating normally Pulm: No SOB Increased aches and pains overall.  Interactive and getting along well at home.  Otherwise, ROS is as per the HPI.  Objective:   BP 100/60 mmHg  Pulse 70  Temp(Src) 97.9 F (36.6 C) (Oral)  Ht 5\' 10"  (1.778 m)  Wt 224 lb 8 oz (101.833 kg)  BMI 32.21 kg/m2  GEN: WDWN, NAD, Non-toxic, A & O x 3 HEENT: Atraumatic, Normocephalic. Neck supple. No masses, No LAD. Ears and Nose: No external deformity. CV: RRR, No M/G/R. No JVD. No thrill. No extra heart sounds. PULM: CTA B, no wheezes, crackles, rhonchi. No retractions. No resp. distress. No accessory muscle use. EXTR: No c/c/e NEURO Normal gait.  PSYCH: Normally interactive. Conversant. Not depressed or anxious appearing.  Calm demeanor.   Laboratory and Imaging Data: Results for orders placed or performed in visit on 12/22/14  CBC with Differential/Platelet  Result Value Ref Range   WBC 5.9 4.0 - 10.5 K/uL   RBC 4.04 (L) 4.22 - 5.81 Mil/uL   Hemoglobin 12.9 (L) 13.0 - 17.0 g/dL   HCT 43.3 (L) 29.5 - 18.8 %   MCV 94.5 78.0 - 100.0 fl   MCHC 33.7 30.0 - 36.0 g/dL   RDW 41.6 60.6 - 30.1 %   Platelets 211.0 150.0 - 400.0 K/uL   Neutrophils Relative % 60.3 43.0 - 77.0 %  Lymphocytes Relative 20.8 12.0 - 46.0 %   Monocytes Relative 15.1 (H) 3.0 - 12.0 %   Eosinophils Relative 3.2 0.0 - 5.0 %   Basophils Relative 0.6 0.0 - 3.0 %   Neutro Abs 3.5 1.4 - 7.7 K/uL   Lymphs Abs 1.2 0.7 - 4.0 K/uL   Monocytes Absolute 0.9 0.1 - 1.0 K/uL   Eosinophils Absolute 0.2 0.0 - 0.7 K/uL   Basophils Absolute 0.0 0.0 - 0.1 K/uL  Basic metabolic panel  Result Value Ref Range   Sodium 135 135 - 145 mEq/L   Potassium 4.4 3.5 - 5.1 mEq/L   Chloride 99 96 - 112 mEq/L   CO2 32 19 - 32 mEq/L   Glucose, Bld 72 70 - 99 mg/dL   BUN 30 (H) 6 -  23 mg/dL   Creatinine, Ser 5.64 (H) 0.40 - 1.50 mg/dL   Calcium 9.3 8.4 - 33.2 mg/dL   GFR 95.18 (L) >84.16 mL/min  Hepatic function panel  Result Value Ref Range   Total Bilirubin 0.5 0.2 - 1.2 mg/dL   Bilirubin, Direct 0.1 0.0 - 0.3 mg/dL   Alkaline Phosphatase 58 39 - 117 U/L   AST 43 (H) 0 - 37 U/L   ALT 38 0 - 53 U/L   Total Protein 7.1 6.0 - 8.3 g/dL   Albumin 3.9 3.5 - 5.2 g/dL     Assessment and Plan:   Bilateral pneumonia  CKD (chronic kidney disease) stage 3, GFR 30-59 ml/min - Plan: Basic metabolic panel  Hemoptysis - Plan: CBC with Differential/Platelet  Long-term use of high-risk medication - Plan: Hepatic function panel  Need for prophylactic vaccination against Streptococcus pneumoniae (pneumococcus) - Plan: Pneumococcal conjugate vaccine 13-valent IM  >25 minutes spent in face to face time with patient, >50% spent in counselling or coordination of care  His lungs are clear currently.  Renal function is stable.  Fatigue is complex and multifactorial.  81 year old patient who was hypoxic and acutely ill, most likely this is a combination of multiple issues, and deconditioning is certainly playing a role.  Recommended that he start to become more active again, start walking, and he and his wife are going to try to do this.  Follow-up: 6 mo  New Prescriptions   No medications on file   Orders Placed This Encounter  Procedures  . Pneumococcal conjugate vaccine 13-valent IM  . CBC with Differential/Platelet  . Basic metabolic panel  . Hepatic function panel    Signed,  Karleen Hampshire T. Jesse Nosbisch, MD   Patient's Medications  New Prescriptions   No medications on file  Previous Medications   ASPIRIN 81 MG EC TABLET    Take 4 tablets (325 mg total) by mouth daily.   CALCIUM CARBONATE (OS-CAL) 600 MG TABS TABLET    Take 600 mg by mouth daily with breakfast.   DILTIAZEM (CARDIZEM CD) 120 MG 24 HR CAPSULE    Take 1 capsule (120 mg total) by mouth daily.    FLECAINIDE (TAMBOCOR) 100 MG TABLET    Take 1 tablet (100 mg total) by mouth 2 (two) times daily.   LOVASTATIN (MEVACOR) 20 MG TABLET    Take 1 tablet (20 mg total) by mouth daily.   METOPROLOL TARTRATE (LOPRESSOR) 25 MG TABLET    Take 1 tablet (25 mg total) by mouth 2 (two) times daily.   OMEPRAZOLE (PRILOSEC) 20 MG CAPSULE    TAKE 1 CAPSULE BY MOUTH EVERY DAY AS NEEDED.   OXYCODONE-ACETAMINOPHEN (PERCOCET) 10-325 MG PER  TABLET    Take 1 tablet by mouth every 4 (four) hours as needed.    SILDENAFIL (REVATIO) 20 MG TABLET    Generic Revatio / Sildanefil 20 mg. 2 - 5 tabs 30 mins prior to intercourse.   TRAZODONE (DESYREL) 50 MG TABLET    Take 0.5-1 tablets (25-50 mg total) by mouth at bedtime as needed for sleep.   TRIAMCINOLONE CREAM (KENALOG) 0.1 %    Apply topically as needed (flare ups).   Modified Medications   No medications on file  Discontinued Medications   DILTIAZEM (TIAZAC) 180 MG 24 HR CAPSULE    Take by mouth.   METOPROLOL (LOPRESSOR) 50 MG TABLET    Take by mouth.   OMEPRAZOLE (PRILOSEC) 40 MG CAPSULE    Take by mouth.   TRAMADOL (ULTRAM) 50 MG TABLET    Take by mouth.

## 2014-12-22 NOTE — Progress Notes (Signed)
Pre visit review using our clinic review tool, if applicable. No additional management support is needed unless otherwise documented below in the visit note. 

## 2014-12-26 ENCOUNTER — Other Ambulatory Visit: Payer: Self-pay | Admitting: Cardiovascular Disease

## 2015-01-03 ENCOUNTER — Inpatient Hospital Stay (HOSPITAL_COMMUNITY)
Admission: EM | Admit: 2015-01-03 | Discharge: 2015-01-06 | DRG: 178 | Disposition: A | Discharge status: Home / Self Care | Payer: Medicare Other | Attending: Internal Medicine | Admitting: Internal Medicine

## 2015-01-03 ENCOUNTER — Encounter (HOSPITAL_COMMUNITY): Payer: Self-pay | Admitting: Emergency Medicine

## 2015-01-03 ENCOUNTER — Emergency Department (HOSPITAL_COMMUNITY): Payer: Medicare Other

## 2015-01-03 DIAGNOSIS — N183 Chronic kidney disease, stage 3 unspecified: Secondary | ICD-10-CM | POA: Diagnosis present

## 2015-01-03 DIAGNOSIS — Z79899 Other long term (current) drug therapy: Secondary | ICD-10-CM | POA: Diagnosis not present

## 2015-01-03 DIAGNOSIS — R4702 Dysphasia: Secondary | ICD-10-CM | POA: Diagnosis not present

## 2015-01-03 DIAGNOSIS — R6883 Chills (without fever): Secondary | ICD-10-CM | POA: Diagnosis not present

## 2015-01-03 DIAGNOSIS — Z8701 Personal history of pneumonia (recurrent): Secondary | ICD-10-CM | POA: Diagnosis not present

## 2015-01-03 DIAGNOSIS — K449 Diaphragmatic hernia without obstruction or gangrene: Secondary | ICD-10-CM | POA: Diagnosis present

## 2015-01-03 DIAGNOSIS — J690 Pneumonitis due to inhalation of food and vomit: Secondary | ICD-10-CM | POA: Diagnosis not present

## 2015-01-03 DIAGNOSIS — R042 Hemoptysis: Secondary | ICD-10-CM | POA: Diagnosis present

## 2015-01-03 DIAGNOSIS — R05 Cough: Secondary | ICD-10-CM

## 2015-01-03 DIAGNOSIS — I471 Supraventricular tachycardia: Secondary | ICD-10-CM | POA: Diagnosis not present

## 2015-01-03 DIAGNOSIS — I10 Essential (primary) hypertension: Secondary | ICD-10-CM | POA: Diagnosis present

## 2015-01-03 DIAGNOSIS — J189 Pneumonia, unspecified organism: Secondary | ICD-10-CM | POA: Diagnosis not present

## 2015-01-03 DIAGNOSIS — R1310 Dysphagia, unspecified: Secondary | ICD-10-CM | POA: Diagnosis present

## 2015-01-03 DIAGNOSIS — I129 Hypertensive chronic kidney disease with stage 1 through stage 4 chronic kidney disease, or unspecified chronic kidney disease: Secondary | ICD-10-CM | POA: Diagnosis not present

## 2015-01-03 DIAGNOSIS — Y95 Nosocomial condition: Secondary | ICD-10-CM | POA: Diagnosis present

## 2015-01-03 DIAGNOSIS — H9190 Unspecified hearing loss, unspecified ear: Secondary | ICD-10-CM | POA: Diagnosis not present

## 2015-01-03 DIAGNOSIS — Z7982 Long term (current) use of aspirin: Secondary | ICD-10-CM

## 2015-01-03 DIAGNOSIS — Z85038 Personal history of other malignant neoplasm of large intestine: Secondary | ICD-10-CM

## 2015-01-03 DIAGNOSIS — K219 Gastro-esophageal reflux disease without esophagitis: Secondary | ICD-10-CM | POA: Diagnosis not present

## 2015-01-03 DIAGNOSIS — Z8249 Family history of ischemic heart disease and other diseases of the circulatory system: Secondary | ICD-10-CM

## 2015-01-03 DIAGNOSIS — E785 Hyperlipidemia, unspecified: Secondary | ICD-10-CM | POA: Diagnosis not present

## 2015-01-03 DIAGNOSIS — J181 Lobar pneumonia, unspecified organism: Secondary | ICD-10-CM | POA: Diagnosis not present

## 2015-01-03 DIAGNOSIS — R059 Cough, unspecified: Secondary | ICD-10-CM

## 2015-01-03 DIAGNOSIS — K920 Hematemesis: Secondary | ICD-10-CM | POA: Diagnosis present

## 2015-01-03 HISTORY — DX: Personal history of other diseases of the musculoskeletal system and connective tissue: Z87.39

## 2015-01-03 HISTORY — DX: Diaphragmatic hernia without obstruction or gangrene: K44.9

## 2015-01-03 LAB — COMPREHENSIVE METABOLIC PANEL
ALBUMIN: 3.4 g/dL — AB (ref 3.5–5.2)
ALK PHOS: 50 U/L (ref 39–117)
ALT: 38 U/L (ref 0–53)
AST: 45 U/L — ABNORMAL HIGH (ref 0–37)
Anion gap: 7 (ref 5–15)
BILIRUBIN TOTAL: 0.5 mg/dL (ref 0.3–1.2)
BUN: 28 mg/dL — ABNORMAL HIGH (ref 6–23)
CO2: 27 mmol/L (ref 19–32)
CREATININE: 1.55 mg/dL — AB (ref 0.50–1.35)
Calcium: 8.6 mg/dL (ref 8.4–10.5)
Chloride: 104 mmol/L (ref 96–112)
GFR calc Af Amer: 47 mL/min — ABNORMAL LOW (ref >90)
GFR calc non Af Amer: 41 mL/min — ABNORMAL LOW (ref >90)
Glucose, Bld: 100 mg/dL — ABNORMAL HIGH (ref 70–99)
Potassium: 4 mmol/L (ref 3.5–5.1)
Sodium: 138 mmol/L (ref 135–145)
Total Protein: 6.3 g/dL (ref 6.0–8.3)

## 2015-01-03 LAB — CBC
HEMATOCRIT: 36.6 % — AB (ref 39.0–52.0)
HEMOGLOBIN: 12.2 g/dL — AB (ref 13.0–17.0)
MCH: 31.9 pg (ref 26.0–34.0)
MCHC: 33.3 g/dL (ref 30.0–36.0)
MCV: 95.8 fL (ref 78.0–100.0)
Platelets: 216 10*3/uL (ref 150–400)
RBC: 3.82 MIL/uL — AB (ref 4.22–5.81)
RDW: 13.2 % (ref 11.5–15.5)
WBC: 11.4 10*3/uL — AB (ref 4.0–10.5)

## 2015-01-03 LAB — EXPECTORATED SPUTUM ASSESSMENT W GRAM STAIN, RFLX TO RESP C

## 2015-01-03 LAB — EXPECTORATED SPUTUM ASSESSMENT W REFEX TO RESP CULTURE: SPECIAL REQUESTS: NORMAL

## 2015-01-03 LAB — I-STAT CG4 LACTIC ACID, ED: Lactic Acid, Venous: 1.83 mmol/L (ref 0.5–2.0)

## 2015-01-03 LAB — PROTIME-INR
INR: 1.04 (ref 0.00–1.49)
Prothrombin Time: 13.7 seconds (ref 11.6–15.2)

## 2015-01-03 MED ORDER — OXYCODONE-ACETAMINOPHEN 10-325 MG PO TABS: 1.0000 | ORAL_TABLET | Freq: Four times a day (QID) | ORAL | Status: DC | PRN

## 2015-01-03 MED ORDER — VANCOMYCIN HCL 10 G IV SOLR
1250.0000 mg | INTRAVENOUS | Status: DC
  Administered 2015-01-04: 1250 mg via INTRAVENOUS
  Filled 2015-01-03: qty 1250

## 2015-01-03 MED ORDER — OXYCODONE-ACETAMINOPHEN 5-325 MG PO TABS
1.0000 | ORAL_TABLET | Freq: Four times a day (QID) | ORAL | Status: DC | PRN
Start: 2015-01-03 — End: 2015-01-06

## 2015-01-03 MED ORDER — CALCIUM CARBONATE 1250 (500 CA) MG PO TABS
1250.0000 mg | ORAL_TABLET | Freq: Every day | ORAL | Status: DC
  Administered 2015-01-04 – 2015-01-06 (×3): 1250 mg via ORAL
  Filled 2015-01-03: qty 3
  Filled 2015-01-03 (×2): qty 1
  Filled 2015-01-03: qty 3

## 2015-01-03 MED ORDER — METOPROLOL TARTRATE 25 MG PO TABS
25.0000 mg | ORAL_TABLET | Freq: Two times a day (BID) | ORAL | Status: DC
Start: 2015-01-03 — End: 2015-01-06
  Administered 2015-01-03 – 2015-01-06 (×6): 25 mg via ORAL
  Filled 2015-01-03 (×7): qty 1

## 2015-01-03 MED ORDER — SODIUM CHLORIDE 0.9 % IV SOLN: INTRAVENOUS | Status: DC

## 2015-01-03 MED ORDER — VANCOMYCIN HCL IN DEXTROSE 1-5 GM/200ML-% IV SOLN
1000.0000 mg | Freq: Once | INTRAVENOUS | Status: DC
  Filled 2015-01-03: qty 200

## 2015-01-03 MED ORDER — TRAZODONE HCL 50 MG PO TABS
25.0000 mg | ORAL_TABLET | Freq: Every evening | ORAL | Status: DC | PRN
  Administered 2015-01-04: 50 mg via ORAL
  Filled 2015-01-03: qty 1

## 2015-01-03 MED ORDER — PIPERACILLIN-TAZOBACTAM 3.375 G IVPB 30 MIN
3.3750 g | Freq: Once | INTRAVENOUS | Status: AC
  Administered 2015-01-03: 3.375 g via INTRAVENOUS
  Filled 2015-01-03: qty 50

## 2015-01-03 MED ORDER — VANCOMYCIN HCL 10 G IV SOLR
2000.0000 mg | Freq: Once | INTRAVENOUS | Status: DC
  Filled 2015-01-03: qty 2000

## 2015-01-03 MED ORDER — CEFEPIME HCL 1 G IJ SOLR
1.0000 g | Freq: Two times a day (BID) | INTRAMUSCULAR | Status: DC
  Administered 2015-01-03 – 2015-01-04 (×3): 1 g via INTRAVENOUS
  Filled 2015-01-03 (×4): qty 1

## 2015-01-03 MED ORDER — ASPIRIN EC 81 MG PO TBEC
81.0000 mg | DELAYED_RELEASE_TABLET | Freq: Every day | ORAL | Status: DC
  Administered 2015-01-04: 81 mg via ORAL
  Filled 2015-01-03: qty 1

## 2015-01-03 MED ORDER — DILTIAZEM HCL ER COATED BEADS 120 MG PO CP24
120.0000 mg | ORAL_CAPSULE | Freq: Every day | ORAL | Status: DC
  Administered 2015-01-04 – 2015-01-06 (×3): 120 mg via ORAL
  Filled 2015-01-03 (×3): qty 1

## 2015-01-03 MED ORDER — PRAVASTATIN SODIUM 20 MG PO TABS
20.0000 mg | ORAL_TABLET | Freq: Every day | ORAL | Status: DC
  Administered 2015-01-03 – 2015-01-05 (×3): 20 mg via ORAL
  Filled 2015-01-03 (×4): qty 1

## 2015-01-03 MED ORDER — VANCOMYCIN HCL IN DEXTROSE 1-5 GM/200ML-% IV SOLN
1000.0000 mg | Freq: Once | INTRAVENOUS | Status: AC
  Administered 2015-01-03: 1000 mg via INTRAVENOUS
  Filled 2015-01-03: qty 200

## 2015-01-03 MED ORDER — OXYCODONE HCL 5 MG PO TABS
5.0000 mg | ORAL_TABLET | Freq: Four times a day (QID) | ORAL | Status: DC | PRN
Start: 2015-01-03 — End: 2015-01-06
  Administered 2015-01-04: 5 mg via ORAL
  Filled 2015-01-03: qty 1

## 2015-01-03 MED ORDER — PANTOPRAZOLE SODIUM 40 MG PO TBEC
40.0000 mg | DELAYED_RELEASE_TABLET | Freq: Every day | ORAL | Status: DC
  Administered 2015-01-04 – 2015-01-06 (×3): 40 mg via ORAL
  Filled 2015-01-03 (×3): qty 1

## 2015-01-03 MED ORDER — FLECAINIDE ACETATE 100 MG PO TABS
100.0000 mg | ORAL_TABLET | Freq: Two times a day (BID) | ORAL | Status: DC
  Administered 2015-01-03 – 2015-01-06 (×6): 100 mg via ORAL
  Filled 2015-01-03 (×8): qty 1

## 2015-01-03 NOTE — H&P (Signed)
History and Physical:    Michael [name]   GBT:[id number] DOB: September 07, 1932 DOA: 01/03/2015  Referring physician: Dr. Lajean Saver PCP: Owens Loffler, MD   Chief Complaint: Hemoptysis  History of Present Illness:   Michael [name] is an 81 y.o. male with a PMH his SVT managed with Flecainide and Cardizem, last echocardiogram done 10/26/14 with EF of 60% and stage II diastolic dysfunction, no PMH of lung disease who has had 2 episodes of pneumonia over the past 6 months, most recently hospitalized in [facility] where he was treated with broad-spectrum antibiotics including vancomycin, who presents with the sudden onset of chills, cough hemptysis and wheezing over the past 24 hours.  His symptoms were associated with pre-syncope, dizziness.  No frank aggravating factors. He takes aspirin every day.  Of note, he had a CT scan of his chest 10/25/14 which showed diffuse multifocal bilateral airspace and groundglass opacities, suspicious for pneumonia.He was referred to a pulmonologist post discharge, but did not follow up with one. The patient also endorses solid food dysphasia of long-standing duration, but has not thought to bring this up, since he felt it was related to specific foods he was eating. He also has significant reflux, particularly at night. He has trouble sleeping at night, but his wife denies that he snores, and he does not seem to awaken due to problems with his breathing. He says he is chronically fatigued and that he has been fatigued since starting on the Flecainide.  Upon initial evaluation, the patient is well appearing and a chest x-ray shows right upper and lower lobe infiltrates consistent with multilobar pneumonia.   ROS:   Constitutional: + Low-grade fever, + chills;  Appetite good; No weight loss, + weight gain, + fatigue.  HEENT: No blurry vision, no diplopia, no pharyngitis, + dysphagia, occasional PND CV: No chest pain, no palpitations, no PND, no orthopnea, no edema.  Resp: No  SOB, no persistent cough, no pleuritic pain. GI: No nausea, no vomiting, no diarrhea, no melena, no hematochezia, no constipation, no abdominal pain, +GERD.  GU: No dysuria, no hematuria, no frequency, no urgency. MSK: no myalgias, + knee arthralgias.  Neuro:  No headache, no focal neurological deficits, no history of seizures.  Psych: No depression, no anxiety, +insomnia.  Endo: No heat intolerance, no cold intolerance, no polyuria, no polydipsia  Skin: No rashes, no skin lesions.  Heme: + easy bruising.  Travel history: [place].   Past Medical History:   Past Medical History  Diagnosis Date  . Colon cancer 1988    Surgery alone, no history of chemo  . Ventral hernia 1988  . HLD (hyperlipidemia)   . GERD (gastroesophageal reflux disease)   . Rotator cuff rupture     Right; s/p repair  . BPH (benign prostatic hypertrophy)   . Abnormality of gait   . Unspecified hearing loss   . Impotence of organic origin   . HTN (hypertension)   . Renal insufficiency     stones  . Arthritis   . Wears hearing aid     both ears  . Chronic back pain   . 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 HOH (hard of hearing)     bilateral hearing aids  . SVT (supraventricular tachycardia)   . GERD (gastroesophageal reflux disease)   . Dysphagia   . H/O calcium pyrophosphate deposition disease (CPPD)     Past Surgical History:   Past Surgical History  Procedure Laterality Date  . Hemicoloectomy  w/ anastomosis  [place]  . Ventral hernia repair  [facility]  . Rotator cuff repair      Right; [facility]  . Vein ligation and stripping      right  . Eye surgery      cataract extraction with IOL bilaterally  . Hernia repair  1963    inguinal  . Rotator cuff repair      right  . Total knee arthroplasty  03/05/2012    Procedure: TOTAL KNEE ARTHROPLASTY;  Surgeon: Gearlean Alf, MD;  Location: [facility];  Service: Orthopedics;  Laterality: Left;  . Colon surgery      colon  cancer  . Mastectomy, partial Left 02/07/2013    Procedure: LEFT SUBCUTANEOUS MASTECTOMY;  Surgeon: Imogene Burn. Georgette Dover, MD;  Location: [place];  Service: General;  Laterality: Left;    Social History:   History   Social History  . Marital Status: Married    Spouse Name: Festus Holts  . Number of Children: 3  . Years of Education: college   Occupational History  . Retired    Social History Main Topics  . Smoking status: Never Smoker   . Smokeless tobacco: Never Used  . Alcohol Use: Yes     Comment: Wine (1/2-1 glass 2x/week  . Drug Use: No  . Sexual Activity: Not on file   Other Topics Concern  . Not on file   Social History Narrative   Married.  Independent with ADLs/ambulation.         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 Osteoarthritis Father   . Congestive Heart Failure Mother     Allergies   Review of patient's allergies indicates no known allergies.  Current Medications:   Prior to Admission medications   Medication Sig Start Date End Date Taking? Authorizing Provider  aspirin 81 MG EC tablet Take 4 tablets (325 mg total) by mouth daily. Patient taking differently: Take 81 mg by mouth daily.  05/26/14  Yes Thompson Grayer, MD  calcium carbonate (OS-CAL) 600 MG TABS tablet Take 600 mg by mouth daily with breakfast.   Yes Historical Provider, MD  diltiazem (CARDIZEM CD) 120 MG 24 hr capsule Take 1 capsule (120 mg total) by mouth daily. 10/15/14  Yes Minna Merritts, MD  flecainide (TAMBOCOR) 100 MG tablet TAKE 1 TABLET BY MOUTH TWICE A DAY 12/26/14  Yes Minna Merritts, MD  lovastatin (MEVACOR) 20 MG tablet Take 1 tablet (20 mg total) by mouth daily. 07/04/14  Yes Owens Loffler, MD    metoprolol tartrate (LOPRESSOR) 25 MG tablet Take 1 tablet (25 mg total) by mouth 2 (two) times daily. 08/15/14  Yes Minna Merritts, MD  omeprazole (PRILOSEC) 20 MG capsule TAKE 1 CAPSULE BY MOUTH EVERY DAY AS NEEDED. 12/15/14  Yes Owens Loffler, MD  oxyCODONE-acetaminophen (PERCOCET) 10-325 MG per tablet Take 1 tablet by mouth every 6 (six) hours as needed for pain (pain).  10/10/14  Yes Historical Provider, MD  sildenafil (REVATIO) 20 MG tablet  Generic Revatio / Sildanefil 20 mg. 2 - 5 tabs 30 mins prior to intercourse. Patient taking differently: Take 40-80 mg by mouth daily as needed (erectile dysfunction). Generic Revatio / Sildanefil 20 mg. 2 - 5 tabs 30 mins prior to intercourse. 08/05/14  Yes Owens Loffler, MD  traZODone (DESYREL) 50 MG tablet Take 0.5-1 tablets (25-50 mg total) by mouth at bedtime as needed for sleep. 08/05/14  Yes Owens Loffler, MD    Physical Exam:   Filed Vitals:   01/03/15 1347  BP: 132/72  Pulse: 60  Temp: 98.6 F (37 C)  TempSrc: Oral  Resp: 18  SpO2: 100%     Physical Exam: Blood pressure 132/72, pulse 60, temperature 98.6 F (37 C), temperature source Oral, resp. rate 18, SpO2 100 %. Gen: No acute distress. Well appearing. Head: Normocephalic, atraumatic. Eyes: PERRL, EOMI, sclerae nonicteric. Mouth: Oropharynx clear. No thrush or posterior pharyngeal exudates. Neck: Supple, no thyromegaly, no lymphadenopathy, no jugular venous distention. Chest: Lungs clear to auscultation bilaterally. CV: Heart sounds are regular, no murmurs, rubs, or gallops. Abdomen: Soft, nontender, nondistended with normal active bowel sounds. Extremities: Extremities show trace edema. Skin: Warm and dry. Neuro: Alert and oriented times 3; cranial nerves II through XII grossly intact with some bilateral hearing loss. Psych: Mood and affect normal.   Data Review:    Labs: Basic Metabolic Panel:  Recent Labs Lab 01/03/15 1439  NA 138  K 4.0  CL 104  CO2 27   GLUCOSE 100*  BUN 28*  CREATININE 1.55*  CALCIUM 8.6   Liver Function Tests:  Recent Labs Lab 01/03/15 1439  AST 45*  ALT 38  ALKPHOS 50  BILITOT 0.5  PROT 6.3  ALBUMIN 3.4*   CBC:  Recent Labs Lab 01/03/15 1439  WBC 11.4*  HGB 12.2*  HCT 36.6*  MCV 95.8  PLT 216    Radiographic Studies: Dg Chest 2 View  01/03/2015   CLINICAL DATA:  HEMOPTYSIS SINCE LAST NIGHT. PNEUMONIA DIAGNOSIS 1 MONTH AGO. Relapse.  EXAM: CHEST  2 VIEW  COMPARISON:  06/12/2014.  FINDINGS: New airspace disease in the RIGHT upper and RIGHT lower lobes. No definite airspace disease in the RIGHT middle lobe based on the lateral view. LEFT lung is clear. Cardiopericardial silhouette is unchanged allowing for mild rotation on today's exam. Aortic arch atherosclerosis. No pleural effusion.  IMPRESSION: New RIGHT upper and RIGHT lower lobe airspace disease, most compatible with multifocal pneumonia. In a patient with a hemoptysis, pulmonary hemorrhage could produce this appearance as well. Followup in 4 weeks to ensure radiographic clearing and exclude an underlying lesion is recommended.   Electronically Signed   By: Dereck Ligas M.D.   On: 01/03/2015 15:01   *I have personally reviewed the images above*  EKG: No EKG done.   Assessment/Plan:   Principal Problem:   Aspiration pneumonia/healthcare associated pneumonia with hemoptysis - Suspect the patient's recent pneumonias in the setting of dysphasia are aspiration related. - Pneumonia order set placed. Would continue cefepime and vancomycin given mild leukocytosis, hemoptysis and low-grade fever. - Check blood cultures, strep pneumonia antigen/Legionella antigen, sputum culture. - Speech therapy evaluation requested. - Patient has further episodes of hemoptysis, discontinue aspirin.  Active Problems:   Hyperlipidemia LDL goal <100 - Continue statin.    Essential hypertension / SVT - Continue Cardizem and Flecainide.    GERD - Continue  PPI.    CKD (chronic kidney disease) stage 3, GFR 30-59 ml/min - Baseline creatinine 1.4-1.7. Creatinine at usual baseline  values.    Dysphasia - Speech therapy for modified barium swallow. - May need EGD for further evaluation.    DVT prophylaxis - SCDs only given complaints of hemoptysis.  Code Status: Full. Family Communication: Festus Holts (wife) and Remo Lipps (son) at bedside. Disposition Plan: Home when stable.  Time spent: 70 minutes.  RAMA,CHRISTINA [facility] Pager [phone] Cell: [phone]   If 7PM-7AM, please contact night-coverage [email or web address] Password [facility] 01/03/2015, 4:45 PM

## 2015-01-03 NOTE — ED Notes (Signed)
Patient transported to X-ray 

## 2015-01-03 NOTE — ED Notes (Signed)
Dr. Rockne Menghini at bedside assessing the patient.

## 2015-01-03 NOTE — ED Notes (Addendum)
Well appearing pt states he began coughing up blood since last night.  States that he was dx with pna about a month ago and is afraid that he is "relapsing".

## 2015-01-03 NOTE — Progress Notes (Signed)
ANTIBIOTIC CONSULT NOTE - INITIAL  Pharmacy Consult for vancomycin/cefepime (Rx may adjust for renal function) Indication: rule out pneumonia  No Known Allergies  Patient Measurements: Height: 5\' 10"  (177.8 cm) Weight: 223 lb 9.6 oz (101.424 kg) IBW/kg (Calculated) : 73 Adjusted Body Weight:   Vital Signs: Temp: 97.6 F (36.4 C) (04/23 1656) Temp Source: Oral (04/23 1656) BP: 141/73 mmHg (04/23 1656) Pulse Rate: 54 (04/23 1656) Intake/Output from previous day:   Intake/Output from this shift: Total I/O In: 50 [IV Piggyback:50] Out: -   Labs:  Recent Labs  01/03/15 1439  WBC 11.4*  HGB 12.2*  PLT 216  CREATININE 1.55*   Estimated Creatinine Clearance: 45.4 mL/min (by C-G formula based on Cr of 1.55). No results for input(s): VANCOTROUGH, VANCOPEAK, VANCORANDOM, GENTTROUGH, GENTPEAK, GENTRANDOM, TOBRATROUGH, TOBRAPEAK, TOBRARND, AMIKACINPEAK, AMIKACINTROU, AMIKACIN in the last 72 hours.   Microbiology: No results found for this or any previous visit (from the past 720 hour(s)).  Medical History: Past Medical History  Diagnosis Date  . Colon cancer 1988    Surgery alone, no history of chemo  . Ventral hernia 1988  . HLD (hyperlipidemia)   . GERD (gastroesophageal reflux disease)   . Rotator cuff rupture     Right; s/p repair  . BPH (benign prostatic hypertrophy)   . Abnormality of gait   . Unspecified hearing loss   . Impotence of organic origin   . HTN (hypertension)   . Renal insufficiency     stones  . Arthritis   . Wears hearing aid     both ears  . Chronic back pain   . 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 HOH (hard of hearing)     bilateral hearing aids  . SVT (supraventricular tachycardia)   . GERD (gastroesophageal reflux disease)   . Dysphagia   . H/O calcium pyrophosphate deposition disease (CPPD)     Assessment: 40 YOF presents with hemoptysis, starting broad spectrum antibiotics for posible aspiration pneumonia or HCAP. SCr is  noted to be elevated at admission.    4/23 >> vancomycin >> 4/23 >> cefepime >>  Normalized CrCl = 106ml/min  Goal of Therapy:  Vancomycin trough level 15-20 mcg/ml  Plan:   Vancomycin 2gm x 1 then 1250mg  IV q24h  Check trough if remains on for ? 72h  Cefepime 1gm IV q12h  Doreene Eland, PharmD, BCPS.   Pager: [phone]  01/03/2015,5:02 PM

## 2015-01-03 NOTE — ED Provider Notes (Signed)
CSN: [id number]     Arrival date & time 01/03/15  1341 History   First MD Initiated Contact with Patient 01/03/15 1406     Chief Complaint  Patient presents with  . Hemoptysis     (Consider location/radiation/quality/duration/timing/severity/associated sxs/prior Treatment) The history is provided by the patient and a relative.  Patient w hx pneumonia, presents from home indicating that this morning had episode increased coughing, and small amount hemoptysis. States was concerned as a month ago in [place] had similar symptoms and had pneumonia then.  Those symptoms had improved/resolved w treatment.  Pt off all antibiotics in past few weeks.  Pt does not chills, feeling cold early this morning - those symptoms have resolved. No sweats. Pt denies sore throat, runny nose, body aches, headaches, or other uri c/o. No current or recent cp or discomfort of any sort. No unusual doe or fatigue. No abd pain. No nvd. No dysuria or gu c/o. No extremity pain or swelling. No skin changes, rash or lesions.  Non smoker. No leg pain or swelling. No pleuritic pain. No sob.      Past Medical History  Diagnosis Date  . Colon cancer 1988  . Ventral hernia 1988  . HLD (hyperlipidemia)   . GERD (gastroesophageal reflux disease)   . Rotator cuff rupture     Right; s/p repair  . BPH (benign prostatic hypertrophy)   . Abnormality of gait   . Unspecified hearing loss   . Impotence of organic origin   . HTN (hypertension)   . Renal insufficiency     stones  . Arthritis   . Wears hearing aid     both ears  . Chronic back pain   . 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 HOH (hard of hearing)     bilateral hearing aids  . SVT (supraventricular tachycardia)    Past Surgical History  Procedure Laterality Date  . Hemicoloectomy w/ anastomosis  [place]  . Ventral hernia repair  [facility]  . Rotator cuff repair      Right; [facility]  . Vein ligation and stripping       right  . Eye surgery      cataract extraction with IOL bilaterally  . Hernia repair  1963    inguinal  . Rotator cuff repair      right  . Total knee arthroplasty  03/05/2012    Procedure: TOTAL KNEE ARTHROPLASTY;  Surgeon: Gearlean Alf, MD;  Location: [facility];  Service: Orthopedics;  Laterality: Left;  . Colon surgery      colon cancer  . Mastectomy, partial Left 02/07/2013    Procedure: LEFT SUBCUTANEOUS MASTECTOMY;  Surgeon: Imogene Burn. Georgette Dover, MD;  Location: [place];  Service: General;  Laterality: Left;   Family History  Problem Relation Age of Onset  . Osteoarthritis Father   . Congestive Heart Failure Mother    History  Substance Use Topics  . Smoking status: Never Smoker   . Smokeless tobacco: Never Used  . Alcohol Use: Yes     Comment: Wine (1/2-1 glass 2x/week    Review of Systems  Constitutional: Positive for chills. Negative for fever.  HENT: Negative for sore throat.   Eyes: Negative for redness.  Respiratory: Positive for cough. Negative for shortness of breath.   Cardiovascular: Negative for chest pain, palpitations and leg swelling.  Gastrointestinal: Negative for vomiting, abdominal pain and diarrhea.  Endocrine: Negative for  polyuria.  Genitourinary: Negative for dysuria and flank pain.  Musculoskeletal: Negative for back pain and neck pain.  Skin: Negative for rash.  Neurological: Negative for headaches.  Hematological: Does not bruise/bleed easily.  Psychiatric/Behavioral: Negative for confusion.      Allergies  Review of patient's allergies indicates no known allergies.  Home Medications   Prior to Admission medications   Medication Sig Start Date End Date Taking? Authorizing Provider  aspirin 81 MG EC tablet Take 4 tablets (325 mg total) by mouth daily. Patient taking differently: Take 81 mg by mouth daily.  05/26/14  Yes Thompson Grayer, MD  calcium carbonate (OS-CAL) 600 MG TABS tablet Take 600 mg by mouth daily with breakfast.    Yes Historical Provider, MD  diltiazem (CARDIZEM CD) 120 MG 24 hr capsule Take 1 capsule (120 mg total) by mouth daily. 10/15/14  Yes Minna Merritts, MD  flecainide (TAMBOCOR) 100 MG tablet TAKE 1 TABLET BY MOUTH TWICE A DAY 12/26/14  Yes Minna Merritts, MD  lovastatin (MEVACOR) 20 MG tablet Take 1 tablet (20 mg total) by mouth daily. 07/04/14  Yes Owens Loffler, MD  metoprolol tartrate (LOPRESSOR) 25 MG tablet Take 1 tablet (25 mg total) by mouth 2 (two) times daily. 08/15/14  Yes Minna Merritts, MD  omeprazole (PRILOSEC) 20 MG capsule TAKE 1 CAPSULE BY MOUTH EVERY DAY AS NEEDED. 12/15/14  Yes Owens Loffler, MD  oxyCODONE-acetaminophen (PERCOCET) 10-325 MG per tablet Take 1 tablet by mouth every 6 (six) hours as needed for pain (pain).  10/10/14  Yes Historical Provider, MD  sildenafil (REVATIO) 20 MG tablet Generic Revatio / Sildanefil 20 mg. 2 - 5 tabs 30 mins prior to intercourse. Patient taking differently: Take 40-80 mg by mouth daily as needed (erectile dysfunction). Generic Revatio / Sildanefil 20 mg. 2 - 5 tabs 30 mins prior to intercourse. 08/05/14  Yes Owens Loffler, MD  traZODone (DESYREL) 50 MG tablet Take 0.5-1 tablets (25-50 mg total) by mouth at bedtime as needed for sleep. 08/05/14  Yes Spencer Copland, MD   BP 132/72 mmHg  Pulse 60  Temp(Src) 98.6 F (37 C) (Oral)  Resp 18  SpO2 100% Physical Exam  Constitutional: He is oriented to person, place, and time. He appears well-developed and well-nourished. No distress.  HENT:  Nose: Nose normal.  Mouth/Throat: Oropharynx is clear and moist.  Eyes: Conjunctivae are normal. No scleral icterus.  Neck: Neck supple. No tracheal deviation present.  No stiffness or rigidity.   Cardiovascular: Normal rate, regular rhythm, normal heart sounds and intact distal pulses.  Exam reveals no gallop and no friction rub.   No murmur heard. Pulmonary/Chest: Effort normal. No accessory muscle usage. No respiratory distress. He has rales.   Rales right mid to lower  Abdominal: Soft. Bowel sounds are normal. He exhibits no distension and no mass. There is no tenderness. There is no rebound and no guarding.  Genitourinary:  No cva tenderness  Musculoskeletal: Normal range of motion. He exhibits no edema or tenderness.  Neurological: He is alert and oriented to person, place, and time.  Skin: Skin is warm and dry. No rash noted. He is not diaphoretic.  Psychiatric: He has a normal mood and affect.  Nursing note and vitals reviewed.   ED Course  Procedures (including critical care time) Labs Review  Results for orders placed or performed during the hospital encounter of 01/03/15  CBC  Result Value Ref Range   WBC 11.4 (H) 4.0 - 10.5 K/uL  RBC 3.82 (L) 4.22 - 5.81 MIL/uL   Hemoglobin 12.2 (L) 13.0 - 17.0 g/dL   HCT 36.6 (L) 39.0 - 52.0 %   MCV 95.8 78.0 - 100.0 fL   MCH 31.9 26.0 - 34.0 pg   MCHC 33.3 30.0 - 36.0 g/dL   RDW 13.2 11.5 - 15.5 %   Platelets 216 150 - 400 K/uL  Comprehensive metabolic panel  Result Value Ref Range   Sodium 138 135 - 145 mmol/L   Potassium 4.0 3.5 - 5.1 mmol/L   Chloride 104 96 - 112 mmol/L   CO2 27 19 - 32 mmol/L   Glucose, Bld 100 (H) 70 - 99 mg/dL   BUN 28 (H) 6 - 23 mg/dL   Creatinine, Ser 1.55 (H) 0.50 - 1.35 mg/dL   Calcium 8.6 8.4 - 10.5 mg/dL   Total Protein 6.3 6.0 - 8.3 g/dL   Albumin 3.4 (L) 3.5 - 5.2 g/dL   AST 45 (H) 0 - 37 U/L   ALT 38 0 - 53 U/L   Alkaline Phosphatase 50 39 - 117 U/L   Total Bilirubin 0.5 0.3 - 1.2 mg/dL   GFR calc non Af Amer 41 (L) >90 mL/min   GFR calc Af Amer 47 (L) >90 mL/min   Anion gap 7 5 - 15  Protime-INR  Result Value Ref Range   Prothrombin Time 13.7 11.6 - 15.2 seconds   INR 1.04 0.00 - 1.49  I-Stat CG4 Lactic Acid, ED  Result Value Ref Range   Lactic Acid, Venous 1.83 0.5 - 2.0 mmol/L   Dg Chest 2 View  01/03/2015   CLINICAL DATA:  HEMOPTYSIS SINCE LAST NIGHT. PNEUMONIA DIAGNOSIS 1 MONTH AGO. Relapse.  EXAM: CHEST  2 VIEW   COMPARISON:  06/12/2014.  FINDINGS: New airspace disease in the RIGHT upper and RIGHT lower lobes. No definite airspace disease in the RIGHT middle lobe based on the lateral view. LEFT lung is clear. Cardiopericardial silhouette is unchanged allowing for mild rotation on today's exam. Aortic arch atherosclerosis. No pleural effusion.  IMPRESSION: New RIGHT upper and RIGHT lower lobe airspace disease, most compatible with multifocal pneumonia. In a patient with a hemoptysis, pulmonary hemorrhage could produce this appearance as well. Followup in 4 weeks to ensure radiographic clearing and exclude an underlying lesion is recommended.   Electronically Signed   By: Dereck Ligas M.D.   On: 01/03/2015 15:01        MDM   Iv ns. Labs. Cxr.  Reviewed nursing notes and prior charts for additional history.   Pt reports chills, pna on cxr.  Pt/family report hospitalization for pna recently.    Will cover for suspected hcap.  Zosyn and vanc iv.    Given recurrent pna/hemoptysis in past couple months, during hospitalization may benefit from pulmonary med consult/eval.  Hospitalists called for admission.    Lajean Saver, MD 01/03/15 [phone]

## 2015-01-03 NOTE — ED Notes (Signed)
Nurse getting labs 

## 2015-01-03 NOTE — Progress Notes (Signed)
Only one blood culture was drawn in the ED before antibiotic was started in ED, notified Dr. Rockne Menghini and she stated to cancel the 2nd blood culture.

## 2015-01-04 DIAGNOSIS — I10 Essential (primary) hypertension: Secondary | ICD-10-CM

## 2015-01-04 DIAGNOSIS — E785 Hyperlipidemia, unspecified: Secondary | ICD-10-CM

## 2015-01-04 LAB — STREP PNEUMONIAE URINARY ANTIGEN: STREP PNEUMO URINARY ANTIGEN: NEGATIVE

## 2015-01-04 NOTE — Evaluation (Signed)
Clinical/Bedside Swallow Evaluation Patient Details  Name: Michael [name] MRN: [id number] Date of Birth: Jul 25, 1932  Today's Date: 01/04/2015 Time: SLP Start Time (ACUTE ONLY): 1520 SLP Stop Time (ACUTE ONLY): 1550 SLP Time Calculation (min) (ACUTE ONLY): 30 min  Past Medical History:  Past Medical History  Diagnosis Date  . Colon cancer 1988    Surgery alone, no history of chemo  . Ventral hernia 1988  . HLD (hyperlipidemia)   . GERD (gastroesophageal reflux disease)   . Rotator cuff rupture     Right; s/p repair  . BPH (benign prostatic hypertrophy)   . Abnormality of gait   . Unspecified hearing loss   . Impotence of organic origin   . HTN (hypertension)   . Renal insufficiency     stones  . Arthritis   . Wears hearing aid     both ears  . Chronic back pain   . 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 HOH (hard of hearing)     bilateral hearing aids  . SVT (supraventricular tachycardia)   . GERD (gastroesophageal reflux disease)   . Dysphagia   . H/O calcium pyrophosphate deposition disease (CPPD)    Past Surgical History:  Past Surgical History  Procedure Laterality Date  . Hemicoloectomy w/ anastomosis  [place]  . Ventral hernia repair  [facility]  . Rotator cuff repair      Right; [facility]  . Vein ligation and stripping      right  . Eye surgery      cataract extraction with IOL bilaterally  . Hernia repair  1963    inguinal  . Rotator cuff repair      right  . Total knee arthroplasty  03/05/2012    Procedure: TOTAL KNEE ARTHROPLASTY;  Surgeon: Gearlean Alf, MD;  Location: [facility];  Service: Orthopedics;  Laterality: Left;  . Colon surgery      colon cancer  . Mastectomy, partial Left 02/07/2013    Procedure: LEFT SUBCUTANEOUS MASTECTOMY;  Surgeon: Imogene Burn. Tsuei, MD;  Location: [place];  Service: General;  Laterality: Left;   HPI:  81 y.o. male with a PMH of SVT, no PMH of lung disease who has had 2  episodes of pneumonia over the past 6 months, most recently hospitalized in [facility], who was admitted 01/03/15 with the sudden onset of chills, cough hemoptysis and wheezing over the past 24 hours. Of note, the patient's review of systems was positive for solid food dysphagia which has never been evaluated. Dx include PNA, hyperlipidemia, GERD, solid food dysphagia.  MD has ordered SLP for swallow eval and MBS if needed.  Pt describes hx of reflux, occasional burning in throat, with intermittent use of Omeprazole.     Assessment / Plan / Recommendation Clinical Impression  Pt presents with normal oropharyngeal swallow with no focal deficits, adequate mastication, appropriate coordination of respiration/swallow sequence, and no overt s/s of aspiration.  He describes symptoms of globus, reflux that reaches his pharynx (LPR), coughing at night that awakens him.  Recommend further esophageal work-up to evaluate for dysmotility vs stricture.  SLP will follow chart for results/recs and education as needed.  Discussed at length with pt and wife, who agree with plan.      Aspiration Risk  Mild    Diet Recommendation Regular;Thin liquid   Liquid Administration via: Cup;Straw Medication Administration: Whole meds with liquid Supervision: Patient able to self feed Postural  Changes and/or Swallow Maneuvers: Seated upright 90 degrees;Upright 30-60 min after meal    Other  Recommendations Recommended Consults: Consider esophageal assessment Oral Care Recommendations: Oral care BID   Follow Up Recommendations  None    Frequency and Duration min 2x/week  1 week     Swallow Study Prior Functional Status       General HPI: 81 y.o. male with a PMH of SVT, no PMH of lung disease who has had 2 episodes of pneumonia over the past 6 months, most recently hospitalized in [facility], who was admitted 01/03/15 with the sudden onset of chills, cough hemoptysis and wheezing over the past 24 hours. Of note, the patient's review  of systems was positive for solid food dysphagia which has never been evaluated. Dx include PNA, hyperlipidemia, GERD, solid food dysphagia.  MD has ordered SLP for swallow eval and MBS if needed.  Pt describes hx of reflux, occasional burning in throat, with intermittent use of Omeprazole.   Type of Study: Bedside swallow evaluation Previous Swallow Assessment: none per records Diet Prior to this Study: Regular;Thin liquids Temperature Spikes Noted: No Respiratory Status: Room air History of Recent Intubation: No Behavior/Cognition: Alert;Cooperative;Pleasant mood Oral Cavity - Dentition: Adequate natural dentition Self-Feeding Abilities: Able to feed self Patient Positioning: Upright in bed Baseline Vocal Quality: Clear Volitional Cough: Strong Volitional Swallow: Able to elicit    Oral/Motor/Sensory Function Overall Oral Motor/Sensory Function: Appears within functional limits for tasks assessed   Ice Chips Ice chips: Within functional limits Presentation: Self Fed;Spoon   Thin Liquid Thin Liquid: Within functional limits Presentation: Straw;Cup    Nectar Thick Nectar Thick Liquid: Not tested   Honey Thick Honey Thick Liquid: Not tested   Puree Puree: Within functional limits Presentation: [place]. [place], [place] CCC/SLP Pager [phone]     Solid: Within functional limits Presentation: Self Fed       Juan Quam Laurice 01/04/2015,3:55 PM

## 2015-01-04 NOTE — Progress Notes (Signed)
Progress Note   [name]:[id number] DOB: 11-27-1931 DOA: 01/03/2015 PCP: [name], MD   Brief Narrative:   Michael [name] is an 81 y.o. male with a PMH his SVT managed with Flecainide and Cardizem, last echocardiogram done 10/26/14 with EF of 60% and stage II diastolic dysfunction, no PMH of lung disease who has had 2 episodes of pneumonia over the past 6 months, most recently hospitalized in [facility] where he was treated with broad-spectrum antibiotics including vancomycin, who was admitted 01/03/15 with the sudden onset of chills, cough hemptysis and wheezing over the past 24 hours. Of note, the patient's review of systems was positive for solid food dysphagia which has never been evaluated.  Assessment/Plan:   Principal Problem:  Aspiration pneumonia/healthcare associated pneumonia with hemoptysis - Suspect the patient's recent pneumonias in the setting of dysphasia are aspiration related. - Pneumonia order set placed. Continue cefepime and vancomycin given mild leukocytosis, hemoptysis and low-grade fever. - Follow-up blood cultures, strep pneumonia antigen/Legionella antigen, sputum culture. - Speech therapy evaluation pending. - Stop ASA due to hemoptysis.  Active Problems:  Hyperlipidemia LDL goal <100 - Continue statin.   Essential hypertension / SVT - Continue Cardizem and Flecainide.   GERD - Continue PPI.   CKD (chronic kidney disease) stage 3, GFR 30-59 ml/min - Baseline creatinine 1.4-1.7. Creatinine at usual baseline values.   Dysphasia - Speech therapy for modified barium swallow. - May need EGD for further evaluation.   DVT prophylaxis - SCDs only given complaints of hemoptysis.  Code Status: Full. Family Communication: [name] (wife) and [name] (son) at bedside. Disposition Plan: Home when stable.  IV Access:    Peripheral IV   Procedures and diagnostic studies:   Dg Chest 2 View 01/03/2015: New RIGHT upper and RIGHT lower lobe  airspace disease, most compatible with multifocal pneumonia. In a patient with a hemoptysis, pulmonary hemorrhage could produce this appearance as well. Followup in 4 weeks to ensure radiographic clearing and exclude an underlying lesion is recommended.     Medical Consultants:    None.  Anti-Infectives:    Cefepime 01/03/15 --->  Vancomycin 01/03/15--->  Subjective:   Michael [name] is still having some cough/hemoptysis.  No fevers, had some short-lasting chills.  No nausea or vomiting.  No trouble swallowing.   Objective:    Filed Vitals:   01/03/15 1347 01/03/15 1656 01/03/15 2210 01/04/15 0525  BP: 132/72 141/73 157/75 152/63  Pulse: 60 54 62 54  Temp: 98.6 F (37 C) 97.6 F (36.4 C) 98.2 F (36.8 C) 97.8 F (36.6 C)  TempSrc: Oral Oral Oral Oral  Resp: 18  18 18   Height:  5\' 10"  (1.778 m)    Weight:  101.424 kg (223 lb 9.6 oz)    SpO2: 100% 95% 96% 96%    Intake/Output Summary (Last 24 hours) at 01/04/15 0853 Last data filed at 01/04/15 8119  Gross per 24 hour  Intake    380 ml  Output   2100 ml  Net  -1720 ml    Exam: Gen:  NAD Cardiovascular:  RRR, No M/R/G Respiratory:  Lungs CTAB Gastrointestinal:  Abdomen soft, NT/ND, + BS Extremities:  Trace edema   Data Reviewed:    Labs: Basic Metabolic Panel:  Recent Labs Lab 01/03/15 1439  NA 138  K 4.0  CL 104  CO2 27  GLUCOSE 100*  BUN 28*  CREATININE 1.55*  CALCIUM 8.6   GFR Estimated Creatinine Clearance: 45.4 mL/min (by C-G  formula based on Cr of 1.55). Liver Function Tests:  Recent Labs Lab 01/03/15 1439  AST 45*  ALT 38  ALKPHOS 50  BILITOT 0.5  PROT 6.3  ALBUMIN 3.4*   Coagulation profile  Recent Labs Lab 01/03/15 1439  INR 1.04    CBC:  Recent Labs Lab 01/03/15 1439  WBC 11.4*  HGB 12.2*  HCT 36.6*  MCV 95.8  PLT 216   Sepsis Labs:  Recent Labs Lab 01/03/15 1439 01/03/15 1440  WBC 11.4*  --   LATICACIDVEN  --  1.83   Microbiology Recent Results  (from the past 240 hour(s))  Culture, blood (single)     Status: None (Preliminary result)   Collection Time: 01/03/15  3:30 PM  Result Value Ref Range Status   Specimen Description BLOOD LEFT HAND  Final   Special Requests   Final    BOTTLES DRAWN AEROBIC AND ANAEROBIC 4CC BOTH BOTTLES   Culture   Final           BLOOD CULTURE RECEIVED NO GROWTH TO DATE CULTURE WILL BE HELD FOR 5 DAYS BEFORE ISSUING A FINAL NEGATIVE REPORT Performed at [facility]    Report Status PENDING  Incomplete  Culture, sputum-assessment     Status: None   Collection Time: 01/03/15  7:27 PM  Result Value Ref Range Status   Specimen Description SPUTUM  Final   Special Requests Normal  Final   Sputum evaluation   Final    THIS SPECIMEN IS ACCEPTABLE. RESPIRATORY CULTURE REPORT TO FOLLOW.   Report Status 01/03/2015 FINAL  Final     Medications:   . aspirin  81 mg Oral Daily  . calcium carbonate  1,250 mg Oral Q breakfast  . ceFEPime (MAXIPIME) IV  1 g Intravenous Q12H  . diltiazem  120 mg Oral Daily  . flecainide  100 mg Oral BID  . metoprolol tartrate  25 mg Oral BID  . pantoprazole  40 mg Oral Daily  . pravastatin  20 mg Oral q1800  . vancomycin  1,250 mg Intravenous Q24H   Continuous Infusions:   Time spent: 35 minutes with > 50% of time discussing current diagnostic test results, clinical impression and plan of care.    LOS: 1 day   [name]  [facility] Pager [phone]. If unable to reach me by pager, please call my cell phone at [phone].  *Please refer to [email or web address], password [email or web address] to get updated schedule on who will round on this patient, as hospitalists switch teams weekly. If 7PM-7AM, please contact night-coverage at [email or web address], password [email or web address] for any overnight needs.  01/04/2015, 8:53 AM

## 2015-01-05 ENCOUNTER — Inpatient Hospital Stay (HOSPITAL_COMMUNITY): Payer: Medicare Other

## 2015-01-05 ENCOUNTER — Telehealth: Payer: Self-pay

## 2015-01-05 LAB — BASIC METABOLIC PANEL
Anion gap: 5 (ref 5–15)
BUN: 21 mg/dL (ref 6–23)
CALCIUM: 8.2 mg/dL — AB (ref 8.4–10.5)
CO2: 27 mmol/L (ref 19–32)
CREATININE: 1.38 mg/dL — AB (ref 0.50–1.35)
Chloride: 105 mmol/L (ref 96–112)
GFR calc Af Amer: 54 mL/min — ABNORMAL LOW (ref >90)
GFR, EST NON AFRICAN AMERICAN: 47 mL/min — AB (ref >90)
GLUCOSE: 98 mg/dL (ref 70–99)
POTASSIUM: 3.7 mmol/L (ref 3.5–5.1)
SODIUM: 137 mmol/L (ref 135–145)

## 2015-01-05 LAB — LEGIONELLA ANTIGEN, URINE

## 2015-01-05 LAB — CBC
HCT: 36.2 % — ABNORMAL LOW (ref 39.0–52.0)
Hemoglobin: 11.8 g/dL — ABNORMAL LOW (ref 13.0–17.0)
MCH: 31.6 pg (ref 26.0–34.0)
MCHC: 32.6 g/dL (ref 30.0–36.0)
MCV: 97.1 fL (ref 78.0–100.0)
PLATELETS: 188 10*3/uL (ref 150–400)
RBC: 3.73 MIL/uL — ABNORMAL LOW (ref 4.22–5.81)
RDW: 13.5 % (ref 11.5–15.5)
WBC: 7 10*3/uL (ref 4.0–10.5)

## 2015-01-05 MED ORDER — LEVOFLOXACIN 750 MG PO TABS: 750.0000 mg | ORAL_TABLET | Freq: Every day | ORAL | Status: DC

## 2015-01-05 MED ORDER — METRONIDAZOLE 500 MG PO TABS
500.0000 mg | ORAL_TABLET | Freq: Three times a day (TID) | ORAL | Status: DC
  Administered 2015-01-05: 500 mg via ORAL
  Filled 2015-01-05: qty 1

## 2015-01-05 MED ORDER — AMOXICILLIN-POT CLAVULANATE 875-125 MG PO TABS
1.0000 | ORAL_TABLET | Freq: Two times a day (BID) | ORAL | Status: DC
  Administered 2015-01-05 – 2015-01-06 (×3): 1 via ORAL
  Filled 2015-01-05 (×4): qty 1

## 2015-01-05 NOTE — Telephone Encounter (Signed)
PLEASE NOTE: All timestamps contained within this report are represented as [place] Standard Time. CONFIDENTIALTY NOTICE: This fax transmission is intended only for the addressee. It contains information that is legally privileged, confidential or otherwise protected from use or disclosure. If you are not the intended recipient, you are strictly prohibited from reviewing, disclosing, copying using or disseminating any of this information or taking any action in reliance on or regarding this information. If you have received this fax in error, please notify us immediately by telephone so that we can arrange for its return to [place]. Phone: [phone], Toll-Free: [phone], Fax: [phone] Page: 1 of 2 Call Id: [id number] [place] Patient Name: Michael [name] Gender: Male DOB: 02-07-1932 Age: 81 Y 9 M 8 D Return Phone Number: [phone] (Primary) Address: City/State/Zip: [place] [address] Client [facility] Night - Client Client Site [place] Physician [place], [place] Type Call Call Type Triage / Clinical Caller Name Festus Holts Relationship To Patient Spouse Return Phone Number [phone] (Primary) Chief Complaint WHEEZING Initial Comment Caller states her husband last night he was wheezing and spitting up blood PreDisposition Did not know what to do Nurse Assessment Nurse: Julien Girt, RN, Almyra Free Date/Time Eilene Ghazi Time): 01/03/2015 9:58:31 AM Confirm and document reason for call. If symptomatic, describe symptoms. ---Caller states last night her husband was wheezing, coughing and spit up some blood. Last night he had cold chills, temp was 99, this morning he is warm, not sure if her thermometer is working. Has the patient traveled out of the country within the last 30 days? ---Not Applicable Does the patient require triage?  ---Yes Related visit to physician within the last 2 weeks? ---No Does the PT have any chronic conditions? (i.e. diabetes, asthma, etc.) ---Yes List chronic conditions. ---Hx pneumonia in Feb. 2016, htn, rapid heart beat Guidelines Guideline Title Affirmed Question Affirmed Notes Nurse Date/Time (Eastern Time) Cough - Acute Productive Wheezing is present Chancy Hurter 01/03/2015 10:04:49 AM Disp. Time Eilene Ghazi Time) Disposition Final User 01/03/2015 9:55:53 AM [employer] Paged On Call to Patient Mickie Hillier 01/03/2015 9:55:56 AM Send to Urgent Queue Mickie Hillier 01/03/2015 10:11:05 AM Called On-Call Provider Julien Girt, RN, Almyra Free Reason: Attempted to [facility]t, spoke to [facility] and they are full. 01/03/2015 10:13:40 AM Call Completed Julien Girt RN, Almyra Free 01/03/2015 10:05:51 AM See Physician within 4 Hours (or PCP triage) Yes Julien Girt, RN, Sheilah Mins NOTE: All timestamps contained within this report are represented as [place] Standard Time. CONFIDENTIALTY NOTICE: This fax transmission is intended only for the addressee. It contains information that is legally privileged, confidential or otherwise protected from use or disclosure. If you are not the intended recipient, you are strictly prohibited from reviewing, disclosing, copying using or disseminating any of this information or taking any action in reliance on or regarding this information. If you have received this fax in error, please notify us immediately by telephone so that we can arrange for its return to [place]. Phone: [phone], Toll-Free: [phone], Fax: [phone] Page: 2 of 2 Call Id: [id number] [place] Understands: Yes Disagree/Comply: Comply Care Advice Given Per Guideline SEE PHYSICIAN WITHIN 4 HOURS (or PCP triage): * You become worse. CARE ADVICE given per Cough - Acute Productive (Adult) guideline. After Care Instructions Given Call Event Type User Date / Time Description Comments User:  Eloisa Northern, RN Date/Time Eilene Ghazi Time): 01/03/2015 10:12:25 AM Advised Festus Holts that the [facility]  clinic is full, she will take her husband to the ED. Verbalized understanding of all instructions and will cb as needed. Referrals [facility] - ED

## 2015-01-05 NOTE — Progress Notes (Addendum)
Progress Note   Michael [name] WUJ:[id number] DOB: 1932/07/07 DOA: 01/03/2015 PCP: [name], MD   Brief Narrative:   Michael [name] is an 81 y.o. male with a PMH his SVT managed with Flecainide and Cardizem, last echocardiogram done 10/26/14 with EF of 60% and stage II diastolic dysfunction, no PMH of lung disease who has had 2 episodes of pneumonia over the past 6 months, most recently hospitalized in [facility] where he was treated with broad-spectrum antibiotics including vancomycin, who was admitted 01/03/15 with the sudden onset of chills, cough hemptysis and wheezing over the past 24 hours. Of note, the patient's review of systems was positive for solid food dysphagia which has never been evaluated.  Assessment/Plan:   Principal Problem:  Aspiration pneumonia/healthcare associated pneumonia with hemoptysis - Suspect the patient's recent pneumonias in the setting of dysphasia are aspiration related. - Pneumonia order set placed. Initially on cefepime/vancomycin. Will switch antibiotics to oral Augmentin. - Follow-up blood cultures, Legionella antigen, sputum culture. - Strep pneumonia negative. - Been followed by speech therapy. - Holding aspirin due to hemoptysis.  Active Problems:  Hyperlipidemia LDL goal <100 - Continue statin.   Essential hypertension / SVT - Continue Cardizem and Flecainide.   GERD - Continue PPI.   CKD (chronic kidney disease) stage 3, GFR 30-59 ml/min - Baseline creatinine 1.4-1.7. Creatinine at usual baseline values.   Dysphasia - Esophagram pending. Speech therapy following. - May need EGD for further evaluation.   DVT prophylaxis - SCDs only given complaints of hemoptysis.  Code Status: Full. Family Communication: [name] (wife) updated at bedside. Disposition Plan: Home when dysphagia fully worked up, possibly tomorrow.  IV Access:    Peripheral IV   Procedures and diagnostic studies:   Dg Chest 2 View 01/03/2015: New RIGHT  upper and RIGHT lower lobe airspace disease, most compatible with multifocal pneumonia. In a patient with a hemoptysis, pulmonary hemorrhage could produce this appearance as well. Followup in 4 weeks to ensure radiographic clearing and exclude an underlying lesion is recommended.     Medical Consultants:    None.  Anti-Infectives:    Cefepime 01/03/15 ---> 01/05/15  Vancomycin 01/03/15---> 01/05/15  Augmentin 01/05/15--->  Subjective:   Michael [name] reports a headache today.  No cough or dyspnea.  One episode of cough with hemoptysis (mild) last night.  No nausea or vomiting.  Ate without any sense of dysphagia.   Objective:    Filed Vitals:   01/04/15 1034 01/04/15 1500 01/04/15 2103 01/05/15 0638  BP: 152/67 137/67 163/58 137/60  Pulse: 60 57 64 59  Temp:  98.1 F (36.7 C) 98.9 F (37.2 C) 98.8 F (37.1 C)  TempSrc:  Oral Oral Oral  Resp:  18 20 18   Height:      Weight:      SpO2:  97% 97% 97%    Intake/Output Summary (Last 24 hours) at 01/05/15 0810 Last data filed at 01/05/15 9562  Gross per 24 hour  Intake   1320 ml  Output   1800 ml  Net   -480 ml    Exam: Gen:  NAD Cardiovascular:  RRR, No M/R/G Respiratory:  Lungs CTAB Gastrointestinal:  Abdomen soft, NT/ND, + BS Extremities:  Trace edema   Data Reviewed:    Labs: Basic Metabolic Panel:  Recent Labs Lab 01/03/15 1439 01/05/15 0524  NA 138 137  K 4.0 3.7  CL 104 105  CO2 27 27  GLUCOSE 100* 98  BUN 28* 21  CREATININE 1.55* 1.38*  CALCIUM 8.6 8.2*   GFR Estimated Creatinine Clearance: 51 mL/min (by C-G formula based on Cr of 1.38). Liver Function Tests:  Recent Labs Lab 01/03/15 1439  AST 45*  ALT 38  ALKPHOS 50  BILITOT 0.5  PROT 6.3  ALBUMIN 3.4*   Coagulation profile  Recent Labs Lab 01/03/15 1439  INR 1.04    CBC:  Recent Labs Lab 01/03/15 1439 01/05/15 0524  WBC 11.4* 7.0  HGB 12.2* 11.8*  HCT 36.6* 36.2*  MCV 95.8 97.1  PLT 216 188   Sepsis  Labs:  Recent Labs Lab 01/03/15 1439 01/03/15 1440 01/05/15 0524  WBC 11.4*  --  7.0  LATICACIDVEN  --  1.83  --    Microbiology Recent Results (from the past 240 hour(s))  Culture, blood (single)     Status: None (Preliminary result)   Collection Time: 01/03/15  3:30 PM  Result Value Ref Range Status   Specimen Description BLOOD LEFT HAND  Final   Special Requests   Final    BOTTLES DRAWN AEROBIC AND ANAEROBIC 4CC BOTH BOTTLES   Culture   Final           BLOOD CULTURE RECEIVED NO GROWTH TO DATE CULTURE WILL BE HELD FOR 5 DAYS BEFORE ISSUING A FINAL NEGATIVE REPORT Performed at [facility]    Report Status PENDING  Incomplete  Culture, sputum-assessment     Status: None   Collection Time: 01/03/15  7:27 PM  Result Value Ref Range Status   Specimen Description SPUTUM  Final   Special Requests Normal  Final   Sputum evaluation   Final    THIS SPECIMEN IS ACCEPTABLE. RESPIRATORY CULTURE REPORT TO FOLLOW.   Report Status 01/03/2015 FINAL  Final     Medications:   . calcium carbonate  1,250 mg Oral Q breakfast  . ceFEPime (MAXIPIME) IV  1 g Intravenous Q12H  . diltiazem  120 mg Oral Daily  . flecainide  100 mg Oral BID  . metoprolol tartrate  25 mg Oral BID  . pantoprazole  40 mg Oral Daily  . pravastatin  20 mg Oral q1800  . vancomycin  1,250 mg Intravenous Q24H   Continuous Infusions:   Time spent: 25 minutes.    LOS: 2 days   Ezelle Surprenant  [facility] Pager [phone]. If unable to reach me by pager, please call my cell phone at [phone].  *Please refer to [email or web address], password [email or web address] to get updated schedule on who will round on this patient, as hospitalists switch teams weekly. If 7PM-7AM, please contact night-coverage at [email or web address], password [email or web address] for any overnight needs.  01/05/2015, 8:10 AM

## 2015-01-05 NOTE — Progress Notes (Signed)
SLP Cancellation Note  Patient Details Name: Michael [name] MRN: [id number] DOB: 20-Nov-1931   Cancelled treatment:       Reason Eval/Treat Not Completed:  (results of esophagram not yet in epic, will follw up next date)   Luanna Salk, [facility] SLP [phone]

## 2015-01-06 ENCOUNTER — Encounter (HOSPITAL_COMMUNITY): Payer: Self-pay | Admitting: Internal Medicine

## 2015-01-06 DIAGNOSIS — K449 Diaphragmatic hernia without obstruction or gangrene: Secondary | ICD-10-CM

## 2015-01-06 HISTORY — DX: Diaphragmatic hernia without obstruction or gangrene: K44.9

## 2015-01-06 LAB — CULTURE, RESPIRATORY: CULTURE: NORMAL

## 2015-01-06 LAB — CULTURE, RESPIRATORY W GRAM STAIN

## 2015-01-06 MED ORDER — AMOXICILLIN-POT CLAVULANATE 875-125 MG PO TABS: 1.0000 | ORAL_TABLET | Freq: Two times a day (BID) | ORAL | Status: DC

## 2015-01-06 NOTE — Discharge Summary (Signed)
Physician Discharge Summary  Michael [name] LTR:[id number] DOB: July 07, 1932 DOA: 01/03/2015  PCP: Owens Loffler, MD  Admit date: 01/03/2015 Discharge date: 01/06/2015   Recommendations for Outpatient Follow-Up:   1. The patient was instructed to F/U with his PCP for referral to GI for consideration of EGD. 2. Can resume aspirin in one week if no further complaints of hemoptysis. 3. Follow-up blood cultures. Negative to date.   Discharge Diagnosis:   Principal Problem:    Aspiration pneumonia Active Problems:    Hyperlipidemia LDL goal <100    Essential hypertension    GERD    CKD (chronic kidney disease) stage 3, GFR 30-59 ml/min    SVT (supraventricular tachycardia)    HCAP (healthcare-associated pneumonia)    Dysphasia    Hemoptysis  Discharge disposition:  Home.    Discharge Condition: Improved.  Diet recommendation: Low sodium, heart healthy.     History of Present Illness:   Michael [name] is an 81 y.o. male with a PMH his SVT managed with Flecainide and Cardizem, last echocardiogram done 10/26/14 with EF of 60% and stage II diastolic dysfunction, no PMH of lung disease who has had 2 episodes of pneumonia over the past 6 months, most recently hospitalized in [facility] where he was treated with broad-spectrum antibiotics including vancomycin, who was admitted 01/03/15 with the sudden onset of chills, cough hemptysis and wheezing over the past 24 hours. Of note, the patient's review of systems was positive for solid food dysphagia which has never been evaluated.   Hospital Course by Problem:   Principal Problem:  Aspiration pneumonia/healthcare associated pneumonia with hemoptysis - Suspect the patient's recent pneumonias in the setting of dysphasia are aspiration related. - Pneumonia order set placed. Initially on cefepime/vancomycin.  - Discharge home on oral Augmentin, to complete a total 7 day course of therapy.. - Strep pneumonia negative. Sputum cultures  grew normal oral pharyngeal flora. Follow-up blood cultures. - Been followed by speech therapy, no evidence of swallowing abnormality. - Holding aspirin due to hemoptysis.  Active Problems:  Hyperlipidemia LDL goal <100 - Continue statin.   Essential hypertension / SVT - Continue Cardizem and Flecainide.   GERD - Continue PPI.   CKD (chronic kidney disease) stage 3, GFR 30-59 ml/min - Baseline creatinine 1.4-1.7. Creatinine at usual baseline values.   Dysphasia - Esophagram showed a small-moderate hiatal hernia but was otherwise normal. - May need EGD for further evaluation.    Medical Consultants:    None.   Discharge Exam:   Filed Vitals:   01/06/15 0549  BP: 152/70  Pulse: 58  Temp: 98.6 F (37 C)  Resp: 18   Filed Vitals:   01/05/15 2133 01/05/15 2136 01/05/15 2320 01/06/15 0549  BP: 169/71 142/74 141/68 152/70  Pulse: 66  64 58  Temp: 98.6 F (37 C)  99.4 F (37.4 C) 98.6 F (37 C)  TempSrc: Oral   Oral  Resp: 18  18 18   Height:      Weight:      SpO2: 96%  96% 96%    Gen:  NAD Cardiovascular:  RRR, No M/R/G Respiratory: Lungs CTAB Gastrointestinal: Abdomen soft, NT/ND with normal active bowel sounds. Extremities: No C/E/C   The results of significant diagnostics from this hospitalization (including imaging, microbiology, ancillary and laboratory) are listed below for reference.     Procedures and Diagnostic Studies:   Dg Chest 2 View 01/03/2015: New RIGHT upper and RIGHT lower lobe airspace disease, most compatible with multifocal pneumonia.  In a patient with a hemoptysis, pulmonary hemorrhage could produce this appearance as well. Followup in 4 weeks to ensure radiographic clearing and exclude an underlying lesion is recommended.   Labs:   Basic Metabolic Panel:  Recent Labs Lab 01/03/15 1439 01/05/15 0524  NA 138 137  K 4.0 3.7  CL 104 105  CO2 27 27  GLUCOSE 100* 98  BUN 28* 21  CREATININE 1.55* 1.38*  CALCIUM 8.6 8.2*     GFR Estimated Creatinine Clearance: 51 mL/min (by C-G formula based on Cr of 1.38). Liver Function Tests:  Recent Labs Lab 01/03/15 1439  AST 45*  ALT 38  ALKPHOS 50  BILITOT 0.5  PROT 6.3  ALBUMIN 3.4*   Coagulation profile  Recent Labs Lab 01/03/15 1439  INR 1.04   CBC:  Recent Labs Lab 01/03/15 1439 01/05/15 0524  WBC 11.4* 7.0  HGB 12.2* 11.8*  HCT 36.6* 36.2*  MCV 95.8 97.1  PLT 216 188   Microbiology Recent Results (from the past 240 hour(s))  Culture, blood (single)     Status: None (Preliminary result)   Collection Time: 01/03/15  3:30 PM  Result Value Ref Range Status   Specimen Description BLOOD LEFT HAND  Final   Special Requests   Final    BOTTLES DRAWN AEROBIC AND ANAEROBIC 4CC BOTH BOTTLES   Culture   Final           BLOOD CULTURE RECEIVED NO GROWTH TO DATE CULTURE WILL BE HELD FOR 5 DAYS BEFORE ISSUING A FINAL NEGATIVE REPORT Performed at [facility]    Report Status PENDING  Incomplete  Culture, sputum-assessment     Status: None   Collection Time: 01/03/15  7:27 PM  Result Value Ref Range Status   Specimen Description SPUTUM  Final   Special Requests Normal  Final   Sputum evaluation   Final    THIS SPECIMEN IS ACCEPTABLE. RESPIRATORY CULTURE REPORT TO FOLLOW.   Report Status 01/03/2015 FINAL  Final  Culture, respiratory (NON-Expectorated)     Status: None   Collection Time: 01/03/15  7:27 PM  Result Value Ref Range Status   Specimen Description SPUTUM  Final   Special Requests NONE  Final   Gram Stain   Final    RARE WBC PRESENT, PREDOMINANTLY PMN NO SQUAMOUS EPITHELIAL CELLS SEEN RARE GRAM POSITIVE COCCI IN PAIRS Performed at [facility]    Culture   Final    NORMAL OROPHARYNGEAL FLORA Performed at [facility]    Report Status 01/06/2015 FINAL  Final     Discharge Instructions:       Discharge Instructions    Call MD for:  difficulty breathing, headache or visual disturbances    Complete  by:  As directed      Call MD for:  extreme fatigue    Complete by:  As directed      Call MD for:  temperature >100.4    Complete by:  As directed      Diet - low sodium heart healthy    Complete by:  As directed      Discharge instructions    Complete by:  As directed   You may resume taking your aspirin in 1 week.  You were treated for pneumonia while you were in the hospital.  It is important that you see your PCP in follow up, and have him/her order a follow up chest x-ray in 4-6 weeks to ensure resolution of the pneumonia and  to exclude any underlying pathology.  Take all of your antibiotics, as prescribed, even if you feel better.  Do not discontinue antibiotics prematurely.     Increase activity slowly    Complete by:  As directed             Medication List    STOP taking these medications        aspirin 81 MG EC tablet      TAKE these medications        amoxicillin-clavulanate 875-125 MG per tablet  Commonly known as:  AUGMENTIN  Take 1 tablet by mouth every 12 (twelve) hours.     calcium carbonate 600 MG Tabs tablet  Commonly known as:  OS-CAL  Take 600 mg by mouth daily with breakfast.     diltiazem 120 MG 24 hr capsule  Commonly known as:  CARDIZEM CD  Take 1 capsule (120 mg total) by mouth daily.     flecainide 100 MG tablet  Commonly known as:  TAMBOCOR  TAKE 1 TABLET BY MOUTH TWICE A DAY     lovastatin 20 MG tablet  Commonly known as:  MEVACOR  Take 1 tablet (20 mg total) by mouth daily.     metoprolol tartrate 25 MG tablet  Commonly known as:  LOPRESSOR  Take 1 tablet (25 mg total) by mouth 2 (two) times daily.     omeprazole 20 MG capsule  Commonly known as:  PRILOSEC  TAKE 1 CAPSULE BY MOUTH EVERY DAY AS NEEDED.     oxyCODONE-acetaminophen 10-325 MG per tablet  Commonly known as:  PERCOCET  Take 1 tablet by mouth every 6 (six) hours as needed for pain (pain).     sildenafil 20 MG tablet  Commonly known as:  REVATIO  Generic Revatio /  Sildanefil 20 mg. 2 - 5 tabs 30 mins prior to intercourse.     traZODone 50 MG tablet  Commonly known as:  DESYREL  Take 0.5-1 tablets (25-50 mg total) by mouth at bedtime as needed for sleep.       Follow-up Information    Follow up with Owens Loffler, MD. Schedule an appointment as soon as possible for a visit in 1 week.   Specialty:  Family Medicine   Why:  for GI follow up/referral.   Contact information:   [address] [place] [address] [phone]        Time coordinating discharge: 35 minutes.  Signed:  RAMA,CHRISTINA  Pager [phone] [facility] 01/06/2015, 5:15 PM

## 2015-01-06 NOTE — Progress Notes (Signed)
Discharge instructions given to patient and wife. Both verbalized understanding. IV d/c'd patient tolerated well.taken downstairs in wheelchair. C.Chelsae Zanella,RN

## 2015-01-06 NOTE — Discharge Instructions (Signed)
Aspiration Precautions Aspiration is the inhaling of a liquid or object into the lungs. Things that can be inhaled into the lungs include:  Food.  Any type of liquid, such as drinks or saliva.  Stomach contents, such as vomit or stomach acid. When these things go into the lungs, damage can occur. Serious complications can then result, such as:  A lung infection (pneumonia).  A collection of pus in the lungs (lung abscess). CAUSES  A decreased level of awareness (consciousness) due to:  Traumatic brain injury or head injury.  Stroke.  Neurological disease.  Seizures.  Decreased or absent gag reflex (inability to cough).  Medical conditions that affect swallowing.  Conditions that affect the food pipe (esophagus) such as a narrowing of the esophagus (esophageal stricture).  Gastroesophageal reflux (GERD). This is also known as acid reflux.  Any type of surgery where you are put under general anesthesia or have sedation.  Drinking large amounts of alcohol.  Taking medication that causes drowsiness, confusion, or weakness.  Aging.  Dental problems.  Having a feeding tube. SYMPTOMS When aspiration occurs, different signs and symptoms can occur, such as:  Coughing (if a person has a cough or gag reflex) after swallowing food or liquids.  Difficulty breathing. This can include things like:  Breathing rapidly.  Breathing very slowly.  Loud breathing.  Hearing "gurgling" lung sounds when a person breaths.  Coughing up phlegm (sputum) that is:  Yellow, tan, or green in color.  Has pieces of food in it.  Bad smelling.  A change in voice (hoarseness) or a "gurgly" sound to the voice.  A change in skin color. The skin may turn red, or a "bluish" type color because of a lack of oxygen (cyanosis).  Fever.  Eyes watering.  Pain in the chest or back.  Facial grimacing .  A feeling of fullness in the throat or that something is stuck in the  throat. DIAGNOSIS  A chest X-ray may be performed. This takes a picture of your lungs. It can show changes in the lungs if aspiration has occurred.  A bronchoscopy may be performed. This is a surgical procedure in which a thin, flexible tube with a camera at the end is inserted into the nose or mouth. The tube is advanced to the lungs so your health care provider can view the lungs and obtain a culture, tissue sample, or remove an aspirated object.  A swallowing evaluation study may be performed to evaluate:  A person's risk of aspiration.  How difficult it is for a person to swallow.  What types of foods are safe for a person to eat. PREVENTION If you are a caregiver to someone who may aspirate, follow the directions below. If you are caring for someone who can eat and drink through their mouth:  Have them sit in an upright position when eating food or drinking fluids, such as:  Sitting up in a chair.  If sitting in a chair is not possible, position the person in bed so they are upright.  Remind the person to eat slowly and chew well.  Do not distract the person. This is especially important for people with thinking or memory (cognitive) problems.  Check the person's mouth for leftover food after eating.  Keep the person sitting upright for 30 to 45 minutes after eating.  Do not serve food or drink for at least 2 hours before bedtime. If you are caring for someone with a feeding tube and he or she  cannot eat or drink through their mouth:  Keep the person in an upright position as much as possible.  Do not  lay the person flat if they are getting continuous feedings. Turn the feeding pump off if you need to lay the person flat for any reason.  Check feeding tube residuals as directed by your health care provider. If a large amount of tube feedings are pulled back (aspirated) from the feeding tube, call your health care provider right away. General guidelines to prevent  aspiration include:  Feed small amounts of food. Do not force feed.  Use as little water as possible when brushing the person's teeth or cleaning his or her mouth.  Provide oral care before and after meals.  Never put food or fluids in the mouth of a person who is not fully alert.  Crush pills and put them in soft food such as pudding or ice cream. Some pills should not be crushed. Check with your health care provider before crushing any medication. SEEK IMMEDIATE MEDICAL CARE IF:   The person has trouble breathing or starts to breathe rapidly.  The person is breathing very slowly or stops breathing.  The person coughs a lot after eating or drinking.  The person has a chronic cough.  The person coughs up thick, yellow, or tan sputum.  The person has a fever or persistent symptoms for more than 72 hours.  The person has a fever and their symptoms suddenly get worse. Document Released: 10/01/2010 Document Revised: 09/03/2013 Document Reviewed: 12/04/2013 [facility] Patient Information 2015 [place], [place]. This information is not intended to replace advice given to you by your health care provider. Make sure you discuss any questions you have with your health care provider.  Hemoptysis Hemoptysis, which means coughing up blood, can be a sign of a minor problem or a serious medical condition. The blood that is coughed up may come from the lungs and airways. Coughed-up blood can also come from bleeding that occurs outside the lungs and airways. Blood can drain into the windpipe during a severe nosebleed or when blood is vomited from the stomach. Because hemoptysis can be a sign of something serious, a medical evaluation is required. For some people with hemoptysis, no definite cause is ever identified. CAUSES  The most common cause of hemoptysis is bronchitis. Some other common causes include:   A ruptured blood vessel caused by coughing or an infection.   A medical condition that  causes damage to the large air passageways (bronchiectasis).   A blood clot in the lungs (pulmonary embolism).   Pneumonia.   Tuberculosis.   Breathing in a small foreign object.   Cancer. For some people with hemoptysis, no definite cause is ever identified.  HOME CARE INSTRUCTIONS  Only take over-the-counter or prescription medicines as directed by your caregiver. Do not use cough suppressants unless your caregiver approves.  If your caregiver prescribes antibiotic medicines, take them as directed. Finish them even if you start to feel better.  Do not smoke. Also avoid secondhand smoke.  Follow up with your caregiver as directed. SEEK IMMEDIATE MEDICAL CARE IF:   You cough up bloody mucus for longer than a week.  You have a blood-producing cough that is severe or getting worse.  You have a blood-producing cough thatcomes and goes over time.  You develop problems with your breathing.   You vomit blood.  You develop bloody or black-colored stools.  You have chest pain.   You develop night sweats.  You feel faint or pass out.   You have a fever or persistent symptoms for more than 2-3 days.  You have a fever and your symptoms suddenly get worse. MAKE SURE YOU:  Understand these instructions.  Will watch your condition.  Will get help right away if you are not doing well or get worse. Document Released: 11/07/2001 Document Revised: 08/15/2012 Document Reviewed: 06/15/2012 [facility] Patient Information 2015 [place], [place]. This information is not intended to replace advice given to you by your health care provider. Make sure you discuss any questions you have with your health care provider.  Pneumonia Pneumonia is an infection of the lungs.  CAUSES Pneumonia may be caused by bacteria or a virus. Usually, these infections are caused by breathing infectious particles into the lungs (respiratory tract). SIGNS AND SYMPTOMS   Cough.  Fever.  Chest  pain.  Increased rate of breathing.  Wheezing.  Mucus production. DIAGNOSIS  If you have the common symptoms of pneumonia, your health care provider will typically confirm the diagnosis with a chest X-ray. The X-ray will show an abnormality in the lung (pulmonary infiltrate) if you have pneumonia. Other tests of your blood, urine, or sputum may be done to find the specific cause of your pneumonia. Your health care provider may also do tests (blood gases or pulse oximetry) to see how well your lungs are working. TREATMENT  Some forms of pneumonia may be spread to other people when you cough or sneeze. You may be asked to wear a mask before and during your exam. Pneumonia that is caused by bacteria is treated with antibiotic medicine. Pneumonia that is caused by the influenza virus may be treated with an antiviral medicine. Most other viral infections must run their course. These infections will not respond to antibiotics.  HOME CARE INSTRUCTIONS   Cough suppressants may be used if you are losing too much rest. However, coughing protects you by clearing your lungs. You should avoid using cough suppressants if you can.  Your health care provider may have prescribed medicine if he or she thinks your pneumonia is caused by bacteria or influenza. Finish your medicine even if you start to feel better.  Your health care provider may also prescribe an expectorant. This loosens the mucus to be coughed up.  Take medicines only as directed by your health care provider.  Do not smoke. Smoking is a common cause of bronchitis and can contribute to pneumonia. If you are a smoker and continue to smoke, your cough may last several weeks after your pneumonia has cleared.  A cold steam vaporizer or humidifier in your room or home may help loosen mucus.  Coughing is often worse at night. Sleeping in a semi-upright position in a recliner or using a couple pillows under your head will help with this.  Get rest  as you feel it is needed. Your body will usually let you know when you need to rest. PREVENTION A pneumococcal shot (vaccine) is available to prevent a common bacterial cause of pneumonia. This is usually suggested for:  People over 54 years old.  Patients on chemotherapy.  People with chronic lung problems, such as bronchitis or emphysema.  People with immune system problems. If you are over 65 or have a high risk condition, you may receive the pneumococcal vaccine if you have not received it before. In some countries, a routine influenza vaccine is also recommended. This vaccine can help prevent some cases of pneumonia.You may be offered the influenza vaccine  as part of your care. If you smoke, it is time to quit. You may receive instructions on how to stop smoking. Your health care provider can provide medicines and counseling to help you quit. SEEK MEDICAL CARE IF: You have a fever. SEEK IMMEDIATE MEDICAL CARE IF:   Your illness becomes worse. This is especially true if you are elderly or weakened from any other disease.  You cannot control your cough with suppressants and are losing sleep.  You begin coughing up blood.  You develop pain which is getting worse or is uncontrolled with medicines.  Any of the symptoms which initially brought you in for treatment are getting worse rather than better.  You develop shortness of breath or chest pain. MAKE SURE YOU:   Understand these instructions.  Will watch your condition.  Will get help right away if you are not doing well or get worse. Document Released: 08/29/2005 Document Revised: 01/13/2014 Document Reviewed: 11/18/2010 [facility] Patient Information 2015 [place], [place]. This information is not intended to replace advice given to you by your health care provider. Make sure you discuss any questions you have with your health care provider.

## 2015-01-06 NOTE — Evaluation (Signed)
Physical Therapy Evaluation Patient Details Name: Michael [name] MRN: [id number] DOB: Mar 21, 1932 Today's Date: 01/06/2015   History of Present Illness  81 yo male adm with multilobular pna; PMH:  HTN, SVT, colon CA, L TKA  Clinical Impression  Patient evaluated by Physical Therapy with no further acute PT needs identified. All education has been completed and the patient has no further questions. See below for any follow-up Physial Therapy or equipment needs. PT is signing off. Thank you for this referral.     Follow Up Recommendations No PT follow up    Equipment Recommendations  None recommended by PT    Recommendations for Other Services       Precautions / Restrictions        Mobility  Bed Mobility Overal bed mobility: Needs Assistance Bed Mobility: Supine to Sit     Supine to sit: Modified independent (Device/Increase time)        Transfers Overall transfer level: Modified independent Equipment used: None                Ambulation/Gait Ambulation/Gait assistance: Supervision;Modified independent (Device/Increase time) Ambulation Distance (Feet): 280 Feet (10' no AD) Assistive device: Rolling walker (2 wheeled)       General Gait Details: cues for RW position adn sfety initially  Stairs            Wheelchair Mobility    Modified Rankin (Stroke Patients Only)       Balance Overall balance assessment: No apparent balance deficits (not formally assessed)                                           Pertinent Vitals/Pain Pain Assessment: 0-10 Pain Score: 3  Pain Location: R knee Pain Descriptors / Indicators: Discomfort Pain Intervention(s): Monitored during session;Repositioned    Home Living Family/patient expects to be discharged to:: Private residence Living Arrangements: Spouse/significant other Available Help at Discharge: Family Type of Home: House Home Access: Stairs to enter Entrance Stairs-Rails:  None Entrance Stairs-Number of Steps: 2-3 Home Layout: One level Home Equipment: Environmental consultant - 2 wheels;Cane - single point      Prior Function Level of Independence: Independent               Hand Dominance        Extremity/Trunk Assessment   Upper Extremity Assessment: Overall WFL for tasks assessed           Lower Extremity Assessment: Overall WFL for tasks assessed;RLE deficits/detail RLE Deficits / Details: R knee WFL but flexion adn extension slightly painful, knee swollen; pt reports OA, has had L knee replaced       Communication   Communication: HOH  Cognition Arousal/Alertness: Awake/alert Behavior During Therapy: WFL for tasks assessed/performed Overall Cognitive Status: Within Functional Limits for tasks assessed                      General Comments      Exercises General Exercises - Lower Extremity Ankle Circles/Pumps: AROM;Both;10 reps Long Arc Quad: AROM;Right;15 reps      Assessment/Plan    PT Assessment Patent does not need any further PT services  PT Diagnosis Difficulty walking   PT Problem List    PT Treatment Interventions     PT Goals (Current goals can be found in the Care Plan section) Acute Rehab PT Goals PT Goal Formulation:  All assessment and education complete, DC therapy    Frequency     Barriers to discharge        Co-evaluation               End of Session Equipment Utilized During Treatment: Gait belt Activity Tolerance: Patient tolerated treatment well Patient left: in chair;with call bell/phone within reach Nurse Communication: Mobility status         Time: 1001-1021 PT Time Calculation (min) (ACUTE ONLY): 20 min   Charges:   PT Evaluation $Initial PT Evaluation Tier I: 1 Procedure     PT G CodesKenyon [name] 01-25-15, 11:48 AM

## 2015-01-09 LAB — CULTURE, BLOOD (SINGLE): Culture: NO GROWTH

## 2015-01-14 ENCOUNTER — Encounter: Payer: Self-pay | Admitting: Gastroenterology

## 2015-01-14 ENCOUNTER — Ambulatory Visit (INDEPENDENT_AMBULATORY_CARE_PROVIDER_SITE_OTHER): Payer: Medicare Other | Admitting: Family Medicine

## 2015-01-14 ENCOUNTER — Encounter: Payer: Self-pay | Admitting: Family Medicine

## 2015-01-14 VITALS — BP 110/62 | HR 51 | Temp 98.2°F | Ht 70.0 in | Wt 217.5 lb

## 2015-01-14 DIAGNOSIS — J690 Pneumonitis due to inhalation of food and vomit: Secondary | ICD-10-CM | POA: Diagnosis not present

## 2015-01-14 DIAGNOSIS — K449 Diaphragmatic hernia without obstruction or gangrene: Secondary | ICD-10-CM | POA: Diagnosis not present

## 2015-01-14 DIAGNOSIS — C4491 Basal cell carcinoma of skin, unspecified: Secondary | ICD-10-CM | POA: Diagnosis not present

## 2015-01-14 DIAGNOSIS — R1310 Dysphagia, unspecified: Secondary | ICD-10-CM | POA: Diagnosis not present

## 2015-01-14 NOTE — Patient Instructions (Signed)

## 2015-01-14 NOTE — Progress Notes (Signed)
Pre visit review using our clinic review tool, if applicable. No additional management support is needed unless otherwise documented below in the visit note. 

## 2015-01-14 NOTE — Progress Notes (Signed)
Dr. Karleen Hampshire T. Maurica Omura, MD, CAQ [facility] [address] [place], [address] Phone: [phone] Fax: [phone]  01/14/2015  Patient: Michael [name], MRN: [id number], DOB: 10/04/1931, 81 y.o.  Primary Physician:  [name], MD  Chief Complaint: Hospitalization Follow-up  Subjective:   Michael [name] is a 81 y.o. very pleasant male patient who presents with the following:  Hospital f/u:  Admit date: 01/03/2015 Discharge date: 01/06/2015  Principal Problem: 1.  Aspiration pneumonia Active Problems:   Hyperlipidemia LDL goal <100   Essential hypertension   GERD   CKD (chronic kidney disease) stage 3, GFR 30-59 ml/min   SVT (supraventricular tachycardia)   HCAP (healthcare-associated pneumonia)   Dysphasia   Hemoptysis  Inpatient team recommendations to outpatient f/u: 2. The patient was instructed to F/U with his PCP for referral to GI for consideration of EGD. 3. Can resume aspirin in one week if no further complaints of hemoptysis. 4. Follow-up blood cultures. Negative to date.  Dr. York Ram note: Michael [name] is an 81 y.o. male with a PMH his SVT managed with Flecainide and Cardizem, last echocardiogram done 10/26/14 with EF of 60% and stage II diastolic dysfunction, no PMH of lung disease who has had 2 episodes of pneumonia over the past 6 months, most recently hospitalized in [facility] where he was treated with broad-spectrum antibiotics including vancomycin, who was admitted 01/03/15 with the sudden onset of chills, cough hemptysis and wheezing over the past 24 hours. Of note, the patient's review of systems was positive for solid food dysphagia which has never been evaluated.  The patient was admitted again, at the end of April with a presumed aspiration pneumonia. From 2/13 - 11/02/2014 he was admitted in [place], [place] with pneumonia and was extremely ill, hypoxic to the 70s at the time of admission and he  was having a large amount of hemoptysis.  During this hospitalization, the patient actually was placed on BiPAP for 3 days.  Since this event and February, the patient has never really fully recovered and he still feels tired and relatively poorly.  He has completed all of his antibiotics.  Secondary to hemoptysis, his aspirin has been held.  Has had some GERD in the past.   BCC left back - he and his wife asked me to look at it. Dr. Donnamarie [name] former patient.   Past Medical History, Surgical History, Social History, Family History, Problem List, Medications, and Allergies have been reviewed and updated if relevant.   GEN: as above GI: No n/v/d, eating normally Pulm: No SOB Interactive and getting along well at home.  Otherwise, ROS is as per the HPI.  Objective:   BP 110/62 mmHg  Pulse 51  Temp(Src) 98.2 F (36.8 C) (Oral)  Ht 5\' 10"  (1.778 m)  Wt 217 lb 8 oz (98.657 kg)  BMI 31.21 kg/m2  SpO2 96%  GEN: WDWN, NAD, Non-toxic, A & O x 3 HEENT: Atraumatic, Normocephalic. Neck supple. No masses, No LAD. Ears and Nose: No external deformity. CV: RRR, No M/G/R. No JVD. No thrill. No extra heart sounds. PULM: CTA B, no wheezes, crackles, rhonchi. No retractions. No resp. distress. No accessory muscle use. EXTR: No c/c/e NEURO Normal gait.  PSYCH: Normally interactive. Conversant. Not depressed or anxious appearing.  Calm demeanor.  SKIN: 1.5 cm lesion, L lower back, raised, somewhat pearly and rough  Laboratory and Imaging Data: Dg Chest 2 View  01/03/2015   CLINICAL DATA:  HEMOPTYSIS  SINCE LAST NIGHT. PNEUMONIA DIAGNOSIS 1 MONTH AGO. Relapse.  EXAM: CHEST  2 VIEW  COMPARISON:  06/12/2014.  FINDINGS: New airspace disease in the RIGHT upper and RIGHT lower lobes. No definite airspace disease in the RIGHT middle lobe based on the lateral view. LEFT lung is clear. Cardiopericardial silhouette is unchanged allowing for mild rotation on today's exam. Aortic arch atherosclerosis. No  pleural effusion.  IMPRESSION: New RIGHT upper and RIGHT lower lobe airspace disease, most compatible with multifocal pneumonia. In a patient with a hemoptysis, pulmonary hemorrhage could produce this appearance as well. Followup in 4 weeks to ensure radiographic clearing and exclude an underlying lesion is recommended.   Electronically Signed   By: Andreas Newport M.D.   On: 01/03/2015 15:01   Dg Esophagus  01/05/2015   CLINICAL DATA:  Multiple pneumonias over the past 6 months. Hemoptysis.  EXAM: ESOPHOGRAM / BARIUM SWALLOW  TECHNIQUE: Combined double contrast and single contrast examination performed using effervescent crystals, thick barium liquid, and thin barium liquid.  FLUOROSCOPY TIME:  Radiation Exposure Index (as provided by the fluoroscopic device):  If the device does not provide the exposure index:  Fluoroscopy Time:  2 minutes and 1 second  Number of Acquired Images:  0  COMPARISON:  Chest radiograph of 01/03/2015. Abdominal pelvic CT of 02/29/2012.  FINDINGS: Hypopharyngeal portion of the exam is unremarkable.  Double contrast evaluation of the esophagus demonstrates no mucosal abnormality.  Evaluation of primary peristalsis demonstrates a normal primary peristaltic wave on each swallow. Small to moderate hiatal hernia. Otherwise, no abnormality on full column evaluation of the esophagus. Aortic atherosclerosis incidentally noted.  IMPRESSION: 1. Small to moderate hiatal hernia. 2. Otherwise, normal esophagram for age.   Electronically Signed   By: Jeronimo Greaves M.D.   On: 01/05/2015 16:19     Assessment and Plan:   Aspiration pneumonia, unspecified aspiration pneumonia type - Plan: Ambulatory referral to Gastroenterology, DG Chest 2 View  Dysphagia - Plan: Ambulatory referral to Gastroenterology  Hiatal hernia - Plan: Ambulatory referral to Gastroenterology  Basal cell carcinoma - Plan: Ambulatory referral to Dermatology  2 very significant pneumonias in a short time.  It is highly  concerning.  Concern for potential aspiration pneumonia, and the inpatient team deferred to outpatient management and gastroenterological consultation.  The patient does have a hiatal hernia.  He has been having some dysphasia.  This may be contributing to his repetitive pneumonias and hemoptysis.  Consult GI for their opinion.  Consideration of EGD.  Follow-up chest x-ray will need to be done in one month's time, and we Arty have ordered this and scheduled this with the patient.  Skin lesion appears to be a basal cell carcinoma.  Consult new dermatological group given that the patient's dermatologist has retired.  The patient also had some questions regarding his chronic kidney disease and his wife.  I have been following him for this for multiple years and he actually had chronic kidney disease stage III in 2009 when he transferred to my practice.  I am not sure where the confusion came from, but may be in that typically I will use a term such as renal insufficiency or decreased kidney function in my discussions with patients, and we have discussed many times.  New Prescriptions   No medications on file   Orders Placed This Encounter  Procedures  . DG Chest 2 View  . Ambulatory referral to Gastroenterology  . Ambulatory referral to Dermatology    Signed,  Karleen Hampshire T.  Terique Kawabata, MD   Patient's Medications  New Prescriptions   No medications on file  Previous Medications   CALCIUM CARBONATE (OS-CAL) 600 MG TABS TABLET    Take 600 mg by mouth daily with breakfast.   DILTIAZEM (CARDIZEM CD) 120 MG 24 HR CAPSULE    Take 1 capsule (120 mg total) by mouth daily.   FLECAINIDE (TAMBOCOR) 100 MG TABLET    TAKE 1 TABLET BY MOUTH TWICE A DAY   LOVASTATIN (MEVACOR) 20 MG TABLET    Take 1 tablet (20 mg total) by mouth daily.   METOPROLOL TARTRATE (LOPRESSOR) 25 MG TABLET    Take 1 tablet (25 mg total) by mouth 2 (two) times daily.   MULTIPLE VITAMINS-MINERALS (CENTRUM SILVER ADULT 50+ PO)    Take 1  tablet by mouth daily.   OMEPRAZOLE (PRILOSEC) 20 MG CAPSULE    TAKE 1 CAPSULE BY MOUTH EVERY DAY AS NEEDED.   OXYCODONE-ACETAMINOPHEN (PERCOCET) 10-325 MG PER TABLET    Take 1 tablet by mouth every 6 (six) hours as needed for pain (pain).    SILDENAFIL (REVATIO) 20 MG TABLET    Generic Revatio / Sildanefil 20 mg. 2 - 5 tabs 30 mins prior to intercourse.   TRAZODONE (DESYREL) 50 MG TABLET    Take 0.5-1 tablets (25-50 mg total) by mouth at bedtime as needed for sleep.  Modified Medications   No medications on file  Discontinued Medications   AMOXICILLIN-CLAVULANATE (AUGMENTIN) 875-125 MG PER TABLET    Take 1 tablet by mouth every 12 (twelve) hours.

## 2015-01-15 ENCOUNTER — Other Ambulatory Visit: Payer: Self-pay | Admitting: Cardiovascular Disease

## 2015-01-15 ENCOUNTER — Encounter: Payer: Self-pay | Admitting: Family Medicine

## 2015-01-16 ENCOUNTER — Encounter: Payer: Self-pay | Admitting: *Deleted

## 2015-01-19 ENCOUNTER — Encounter: Payer: Self-pay | Admitting: Physician Assistant

## 2015-01-19 ENCOUNTER — Other Ambulatory Visit (HOSPITAL_COMMUNITY): Payer: Self-pay | Admitting: Physician Assistant

## 2015-01-19 ENCOUNTER — Ambulatory Visit (INDEPENDENT_AMBULATORY_CARE_PROVIDER_SITE_OTHER): Payer: Medicare Other | Admitting: Physician Assistant

## 2015-01-19 ENCOUNTER — Encounter: Payer: Self-pay | Admitting: Gastroenterology

## 2015-01-19 VITALS — BP 106/64 | HR 60 | Ht 68.5 in | Wt 217.1 lb

## 2015-01-19 DIAGNOSIS — R1310 Dysphagia, unspecified: Secondary | ICD-10-CM

## 2015-01-19 DIAGNOSIS — K219 Gastro-esophageal reflux disease without esophagitis: Secondary | ICD-10-CM | POA: Diagnosis not present

## 2015-01-19 DIAGNOSIS — R1314 Dysphagia, pharyngoesophageal phase: Secondary | ICD-10-CM

## 2015-01-19 DIAGNOSIS — Z86010 Personal history of colonic polyps: Secondary | ICD-10-CM

## 2015-01-19 MED ORDER — NA SULFATE-K SULFATE-MG SULF 17.5-3.13-1.6 GM/177ML PO SOLN: 1.0000 | Freq: Once | ORAL | Status: DC

## 2015-01-19 NOTE — Patient Instructions (Signed)
You have been scheduled for an endoscopy and colonoscopy. Please follow the written instructions given to you at your visit today. Please pick up your prep supplies at the pharmacy within the next 1-3 days. If you use inhalers (even only as needed), please bring them with you on the day of your procedure. Your physician has requested that you go to www.startemmi.com and enter the access code given to you at your visit today. This web site gives a general overview about your procedure. However, you should still follow specific instructions given to you by our office regarding your preparation for the procedure. You have been scheduled for a modified barium swallow on 01/23/15 at 11 am. Please arrive 15 minutes prior to your test for registration. You will go to [facility] Radiology (1st Floor) for your appointment. Please refrain from eating or drinking anything 4 hours prior to your test. Should you need to cancel or reschedule your appointment, please contact [phone] [facility]) or [phone] [facility]). _____________________________________________________________________ A Modified Barium Swallow Study, or MBS, is a special x-ray that is taken to check swallowing skills. It is carried out by a Stage manager and a Psychologist, clinical (SLP). During this test, yourmouth, throat, and esophagus, a muscular tube which connects your mouth to your stomach, is checked. The test will help you, your doctor, and the SLP plan what types of foods and liquids are easier for you to swallow. The SLP will also identify positions and ways to help you swallow more easily and safely. What will happen during an MBS? You will be taken to an x-ray room and seated comfortably. You will be asked to swallow small amounts of food and liquid mixed with barium. Barium is a liquid or paste that allows images of your mouth, throat and esophagus to be seen on x-ray. The x-ray captures moving images of the food you are  swallowing as it travels from your mouth through your throat and into your esophagus. This test helps identify whether food or liquid is entering your lungs (aspiration). The test also shows which part of your mouth or throat lacks strength or coordination to move the food or liquid in the right direction. This test typically takes 30 minutes to 1 hour to complete. _______________________________________________________________________ Use your omeprazole 1 tab in the morning 20-30 minutes prior to eating.  Gastroesophageal Reflux Disease, Adult Gastroesophageal reflux disease (GERD) happens when acid from your stomach flows up into the esophagus. When acid comes in contact with the esophagus, the acid causes soreness (inflammation) in the esophagus. Over time, GERD may create small holes (ulcers) in the lining of the esophagus. CAUSES   Increased body weight. This puts pressure on the stomach, making acid rise from the stomach into the esophagus.  Smoking. This increases acid production in the stomach.  Drinking alcohol. This causes decreased pressure in the lower esophageal sphincter (valve or ring of muscle between the esophagus and stomach), allowing acid from the stomach into the esophagus.  Late evening meals and a full stomach. This increases pressure and acid production in the stomach.  A malformed lower esophageal sphincter. Sometimes, no cause is found. SYMPTOMS   Burning pain in the lower part of the mid-chest behind the breastbone and in the mid-stomach area. This may occur twice a week or more often.  Trouble swallowing.  Sore throat.  Dry cough.  Asthma-like symptoms including chest tightness, shortness of breath, or wheezing. DIAGNOSIS  Your caregiver may be able to diagnose GERD based on  your symptoms. In some cases, X-rays and other tests may be done to check for complications or to check the condition of your stomach and esophagus. TREATMENT  Your caregiver may  recommend over-the-counter or prescription medicines to help decrease acid production. Ask your caregiver before starting or adding any new medicines.  HOME CARE INSTRUCTIONS   Change the factors that you can control. Ask your caregiver for guidance concerning weight loss, quitting smoking, and alcohol consumption.  Avoid foods and drinks that make your symptoms worse, such as:  Caffeine or alcoholic drinks.  Chocolate.  Peppermint or mint flavorings.  Garlic and onions.  Spicy foods.  Citrus fruits, such as oranges, lemons, or limes.  Tomato-based foods such as sauce, chili, salsa, and pizza.  Fried and fatty foods.  Avoid lying down for the 3 hours prior to your bedtime or prior to taking a nap.  Eat small, frequent meals instead of large meals.  Wear loose-fitting clothing. Do not wear anything tight around your waist that causes pressure on your stomach.  Raise the head of your bed 6 to 8 inches with wood blocks to help you sleep. Extra pillows will not help.  Only take over-the-counter or prescription medicines for pain, discomfort, or fever as directed by your caregiver.  Do not take aspirin, ibuprofen, or other nonsteroidal anti-inflammatory drugs (NSAIDs). SEEK IMMEDIATE MEDICAL CARE IF:   You have pain in your arms, neck, jaw, teeth, or back.  Your pain increases or changes in intensity or duration.  You develop nausea, vomiting, or sweating (diaphoresis).  You develop shortness of breath, or you faint.  Your vomit is green, yellow, black, or looks like coffee grounds or blood.  Your stool is red, bloody, or black. These symptoms could be signs of other problems, such as heart disease, gastric bleeding, or esophageal bleeding. MAKE SURE YOU:   Understand these instructions.  Will watch your condition.  Will get help right away if you are not doing well or get worse. Document Released: 06/08/2005 Document Revised: 11/21/2011 Document Reviewed:  03/18/2011 [facility] Patient Information 2015 [place], [place]. This information is not intended to replace advice given to you by your health care provider. Make sure you discuss any questions you have with your health care provider.

## 2015-01-19 NOTE — Progress Notes (Signed)
Patient ID: Michael [name], male   DOB: [birth year], 80 y.o.   MRN: [id number]    HPI:  Michael [name] is a 81 y.o.   male  referred by [name], MD for evaluation of dysphagia. He shouldn't was also like to discuss surveillance colonoscopy.  Michael [name] is a delightful 81 year old male who is accompanied to this visit by his wife. He has had 2 admissions for pneumonia over the past 6 months. He was hospitalized in [place] in February with pneumonia where he was treated with broad-spectrum antibiotics including vancomycin. He was admitted to [facility] April 23 with cough, hemoptysis, wheezing, and chills. It was felt that he had an aspiration pneumonia. He had a barium swallow in the hospital that was non-revealing. Review of records show that he did complain of some solid food dysphagia at some point but he says this is no longer occurring. He has no dysphagia to liquids. He has had several episodes where he coughs when he eats and feels as if food wants to go down the wrong pipe. He does have a history of GERD and has been prescribed omeprazole which he admits he only uses sporadically. He has frequent breakthrough heartburn and has been getting nocturnal regurgitation. He states when he belches it feels like his throat is on fire.  He has a history of colon cancer for which he had surgery in 1988 he did not have chemotherapy afterwards. His last surveillance colonoscopy was on 01/08/2009 at which time he was noted to have an anastomosis in the rectosigmoid colon and diverticuli in the descending colon. He was advised to have surveillance in 5 years and would like to schedule that at this time as well. He has moving his bowels regularly on a normal basis with no bright red blood per rectum or melena.   Past Medical History  Diagnosis Date  . Colon cancer 1988    Surgery alone, no history of chemo  . Ventral hernia 1988  . HLD (hyperlipidemia)   . GERD (gastroesophageal reflux disease)     . Rotator cuff rupture     Right; s/p repair  . BPH (benign prostatic hypertrophy)   . Abnormality of gait   . Unspecified hearing loss   . Impotence of organic origin   . HTN (hypertension)   . Renal insufficiency     stones  . Arthritis   . Chronic back pain   . 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 HOH (hard of hearing)     bilateral hearing aids  . SVT (supraventricular tachycardia)   . GERD (gastroesophageal reflux disease)   . Dysphagia   . H/O calcium pyrophosphate deposition disease (CPPD)   . Hiatal hernia 01/06/2015  . Diverticulosis   . Aspiration pneumonia     Past Surgical History  Procedure Laterality Date  . Hemicoloectomy w/ anastomosis  1988    [name]  . Umbilical hernia repair  [facility], x 4  . Rotator cuff repair Right      [facility]  . Vein ligation and stripping Right   . Eye surgery Bilateral     cataract extraction with IOL bilaterally  . Inguinal hernia repair Right 1963  . Rotator cuff repair Right   . Total knee arthroplasty  03/05/2012    Procedure: TOTAL KNEE ARTHROPLASTY;  Surgeon: Loanne Drilling, MD;  Location: [facility];  Service: Orthopedics;  Laterality: Left;  Marland Kitchen Mastectomy, partial Left 02/07/2013  Procedure: LEFT SUBCUTANEOUS MASTECTOMY;  Surgeon: Wilmon Arms. Corliss Skains, MD;  Location: [facility];  Service: General;  Laterality: Left;   Family History  Problem Relation Age of Onset  . Osteoarthritis Father   . Congestive Heart Failure Mother    History  Substance Use Topics  . Smoking status: Never Smoker   . Smokeless tobacco: Never Used  . Alcohol Use: 0.0 oz/week    0 Standard drinks or equivalent per week     Comment: Wine (1/2-1 glass 2x/week)   Current Outpatient Prescriptions  Medication Sig Dispense Refill  . calcium carbonate (OS-CAL) 600 MG TABS tablet Take 600 mg by mouth daily with breakfast.    . diltiazem (CARDIZEM CD) 120 MG 24 hr capsule Take 1 capsule (120 mg total) by  mouth daily. 30 capsule 3  . flecainide (TAMBOCOR) 100 MG tablet TAKE 1 TABLET BY MOUTH TWICE A DAY 60 tablet 3  . lovastatin (MEVACOR) 20 MG tablet Take 1 tablet (20 mg total) by mouth daily. 90 tablet 3  . metoprolol tartrate (LOPRESSOR) 25 MG tablet TAKE 1 TABLET BY MOUTH TWICE A DAY 60 tablet 3  . Multiple Vitamins-Minerals (CENTRUM SILVER ADULT 50+ PO) Take 1 tablet by mouth daily.    Marland Kitchen omeprazole (PRILOSEC) 20 MG capsule TAKE 1 CAPSULE BY MOUTH EVERY DAY AS NEEDED. 90 capsule 3  . oxyCODONE-acetaminophen (PERCOCET) 10-325 MG per tablet Take 1 tablet by mouth every 6 (six) hours as needed for pain (pain).     . sildenafil (REVATIO) 20 MG tablet Generic Revatio / Sildanefil 20 mg. 2 - 5 tabs 30 mins prior to intercourse. (Patient taking differently: Take 40-80 mg by mouth daily as needed (erectile dysfunction). Generic Revatio / Sildanefil 20 mg. 2 - 5 tabs 30 mins prior to intercourse.) 50 tablet 11  . traZODone (DESYREL) 50 MG tablet Take 0.5-1 tablets (25-50 mg total) by mouth at bedtime as needed for sleep. 30 tablet 5  . Na Sulfate-K Sulfate-Mg Sulf SOLN Take 1 kit by mouth once. 354 mL 0   No current facility-administered medications for this visit.   No Known Allergies   Review of Systems: Gen: Denies any fever, chills, sweats, anorexia, fatigue, weakness, malaise, weight loss, and sleep disorder CV: Denies chest pain, angina, palpitations, syncope, orthopnea, PND, peripheral edema, and claudication. Resp: Denies dyspnea at rest, dyspnea with exercise, sputum, wheezing, coughing up blood, and pleurisy. GI: Denies vomiting blood, jaundice, and fecal incontinence.    GU : Denies urinary burning, blood in urine, urinary frequency, urinary hesitancy, nocturnal urination, and urinary incontinence. MS: Denies joint pain, limitation of movement, and swelling, stiffness, low back pain, extremity pain. Denies muscle weakness, cramps, atrophy.  Derm: Denies rash, itching, dry skin, hives,  moles, warts, or unhealing ulcers.  Psych: Denies depression, anxiety, memory loss, suicidal ideation, hallucinations, paranoia, and confusion. Heme: Denies bruising, bleeding, and enlarged lymph nodes. Neuro:  Denies any headaches, dizziness, paresthesias. Endo:  Denies any problems with DM, thyroid, adrenal function  Studies: Dg Chest 2 View  01/03/2015   CLINICAL DATA:  HEMOPTYSIS SINCE LAST NIGHT. PNEUMONIA DIAGNOSIS 1 MONTH AGO. Relapse.  EXAM: CHEST  2 VIEW  COMPARISON:  06/12/2014.  FINDINGS: New airspace disease in the RIGHT upper and RIGHT lower lobes. No definite airspace disease in the RIGHT middle lobe based on the lateral view. LEFT lung is clear. Cardiopericardial silhouette is unchanged allowing for mild rotation on today's exam. Aortic arch atherosclerosis. No pleural effusion.  IMPRESSION: New RIGHT upper and  RIGHT lower lobe airspace disease, most compatible with multifocal pneumonia. In a patient with a hemoptysis, pulmonary hemorrhage could produce this appearance as well. Followup in 4 weeks to ensure radiographic clearing and exclude an underlying lesion is recommended.   Electronically Signed   By: Andreas Newport M.D.   On: 01/03/2015 15:01   Dg Esophagus  01/05/2015   CLINICAL DATA:  Multiple pneumonias over the past 6 months. Hemoptysis.  EXAM: ESOPHOGRAM / BARIUM SWALLOW  TECHNIQUE: Combined double contrast and single contrast examination performed using effervescent crystals, thick barium liquid, and thin barium liquid.  FLUOROSCOPY TIME:  Radiation Exposure Index (as provided by the fluoroscopic device):  If the device does not provide the exposure index:  Fluoroscopy Time:  2 minutes and 1 second  Number of Acquired Images:  0  COMPARISON:  Chest radiograph of 01/03/2015. Abdominal pelvic CT of 02/29/2012.  FINDINGS: Hypopharyngeal portion of the exam is unremarkable.  Double contrast evaluation of the esophagus demonstrates no mucosal abnormality.  Evaluation of primary  peristalsis demonstrates a normal primary peristaltic wave on each swallow. Small to moderate hiatal hernia. Otherwise, no abnormality on full column evaluation of the esophagus. Aortic atherosclerosis incidentally noted.  IMPRESSION: 1. Small to moderate hiatal hernia. 2. Otherwise, normal esophagram for age.   Electronically Signed   By: Jeronimo Greaves M.D.   On: 01/05/2015 16:19     Prior Endoscopies:   See history of present illness  Physical Exam: BP 106/64 mmHg  Pulse 60  Ht 5' 8.5" (1.74 m)  Wt 217 lb 2 oz (98.487 kg)  BMI 32.53 kg/m2 Constitutional: Pleasant,well-developed, male in no acute distress. HEENT: Normocephalic and atraumatic. Conjunctivae are normal. No scleral icterus. Neck supple. No JVD Cardiovascular: Normal rate, regular rhythm.  Pulmonary/chest: Effort normal and breath sounds normal. No wheezing, rales or rhonchi. Abdominal: Soft, nondistended, nontender. Bowel sounds active throughout. There are no masses palpable. No hepatomegaly. Extremities: no edema Lymphadenopathy: No cervical adenopathy noted. Neurological: Alert and oriented to person place and time. Skin: Skin is warm and dry. No rashes noted. Psychiatric: Normal mood and affect. Behavior is normal.  ASSESSMENT AND PLAN: #1. Intermittent dysphagia amongst a recent history of 2 bouts of pneumonia. Patient has been advised to use his omeprazole each morning 30 minutes prior to breakfast. An antireflux regimen has been reviewed. He has been instructed to avoid eating or drinking for at least 2 hours before he retires in the evening. He will be scheduled for a modified barium swallow with speech pathology to evaluate for possible transfer dysphagia. Pending the results of this study he may be scheduled for an EGD.  #2. Personal history of colon cancer. He is due for surveillance colonoscopy and would like to schedule this.The risks, benefits, and alternatives to colonoscopy with possible biopsy and possible  polypectomy were discussed with the patient and they consent to proceed.   Further recommendations will be made pending the findings of the above.    Ramy Greth, Tollie Pizza PA-C 01/19/2015, 4:14 PM  CC: [name], MD

## 2015-01-20 NOTE — Progress Notes (Signed)
Reviewed and agree with management. [place] Kievit D. Dortha Neighbors, M.D., FACG  

## 2015-01-23 ENCOUNTER — Ambulatory Visit (HOSPITAL_COMMUNITY)
Admission: RE | Admit: 2015-01-23 | Discharge: 2015-01-23 | Disposition: A | Discharge status: Home / Self Care | Payer: Medicare Other | Source: Ambulatory Visit | Attending: Physician Assistant | Admitting: Physician Assistant

## 2015-01-23 DIAGNOSIS — R1314 Dysphagia, pharyngoesophageal phase: Secondary | ICD-10-CM

## 2015-01-23 DIAGNOSIS — Z86010 Personal history of colonic polyps: Secondary | ICD-10-CM | POA: Diagnosis not present

## 2015-01-23 DIAGNOSIS — R1313 Dysphagia, pharyngeal phase: Secondary | ICD-10-CM | POA: Diagnosis not present

## 2015-01-23 DIAGNOSIS — R1310 Dysphagia, unspecified: Secondary | ICD-10-CM

## 2015-01-23 DIAGNOSIS — R05 Cough: Secondary | ICD-10-CM | POA: Diagnosis not present

## 2015-01-23 NOTE — Procedures (Signed)
Objective Swallowing Evaluation:  (MBS)  Patient Details  Name: Michael [name] MRN: [id number] Date of Birth: 20-Oct-1931  Today's Date: 01/23/2015 Time: SLP Start Time (ACUTE ONLY): 1105-SLP Stop Time (ACUTE ONLY): 1148 SLP Time Calculation (min) (ACUTE ONLY): 43 min  Past Medical History:  Past Medical History  Diagnosis Date  . Colon cancer 1988    Surgery alone, no history of chemo  . Ventral hernia 1988  . HLD (hyperlipidemia)   . GERD (gastroesophageal reflux disease)   . Rotator cuff rupture     Right; s/p repair  . BPH (benign prostatic hypertrophy)   . Abnormality of gait   . Unspecified hearing loss   . Impotence of organic origin   . HTN (hypertension)   . Renal insufficiency     stones  . Arthritis   . Chronic back pain   . 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 HOH (hard of hearing)     bilateral hearing aids  . SVT (supraventricular tachycardia)   . GERD (gastroesophageal reflux disease)   . Dysphagia   . H/O calcium pyrophosphate deposition disease (CPPD)   . Hiatal hernia 01/06/2015  . Diverticulosis   . Aspiration pneumonia    Past Surgical History:  Past Surgical History  Procedure Laterality Date  . Hemicoloectomy w/ anastomosis  [place]  . Umbilical hernia repair  [facility], x 4  . Rotator cuff repair Right      [facility]  . Vein ligation and stripping Right   . Eye surgery Bilateral     cataract extraction with IOL bilaterally  . Inguinal hernia repair Right 1963  . Rotator cuff repair Right   . Total knee arthroplasty  03/05/2012    Procedure: TOTAL KNEE ARTHROPLASTY;  Surgeon: Gearlean Alf, MD;  Location: [facility];  Service: Orthopedics;  Laterality: Left;  Marland Kitchen Mastectomy, partial Left 02/07/2013    Procedure: LEFT SUBCUTANEOUS MASTECTOMY;  Surgeon: Imogene Burn. Georgette Dover, MD;  Location: [place];  Service: General;  Laterality: Left;   HPI:  Other Pertinent Information: 81 yr old seen for  outpatient MBS accompanied by his wife. Pt reports pharyngeal globus senation intermittently during meals; denies coughing during or after meals. PMH: GERD, hiatal hernia (diagnosed during barium esophagram 01/05/15), multiple pneumonias x 6 months, lung diseaze. Previous BSE 01/04/15 suspected esophageal deficits.   No Data Recorded  Assessment / Plan / Recommendation CHL IP CLINICAL IMPRESSIONS 01/23/2015  Therapy Diagnosis (No Data)  Clinical Impression Pt exhibits mild-moderate pharyngeal dysphagia evidenced by decreased sensation leading to delayed swallow initiation to valleculae. Laryngeal penetration with thin and nectar (sensed intermittently with ocassional ineffective throat clear) remained in laryngeal vestibule. Cued volitional cough/throat clear resulted in clearance of penetrates. Various therapeutic techniques attempted including chin tuck did not effectively prevent of decrease penetration with thin or nectar, however volitional and conscious efforts for small sips nectar thick did not lead to penetration during this study. Brief esophageal scan did not reveal abnormalities although MBS does not diagnose below level of UES. SLP recommends continue regular texture (moist meats/gravy) and nectar thick liquids, small sips, alternate liquids/solids, cough/throat clear during meals and pills whole in applesauce.Verbal and written education provided re: ThickenUp Clear (where to purchase, how to mix etc).         CHL IP TREATMENT RECOMMENDATION 01/23/2015  Treatment Recommendations No treatment recommended at this time     CHL IP DIET RECOMMENDATION 01/23/2015  SLP  Diet Recommendations Nectar  Liquid Administration via (None)  Medication Administration Whole meds with puree  Compensations Small sips/bites;Slow rate;Clear throat intermittently;Hard cough after swallow;Follow solids with liquid  Postural Changes and/or Swallow Maneuvers (None)     CHL IP OTHER RECOMMENDATIONS 01/23/2015   Recommended Consults (None)  Oral Care Recommendations Oral care BID  Other Recommendations (None)     CHL IP FOLLOW UP RECOMMENDATIONS 01/04/2015  Follow up Recommendations None     CHL IP FREQUENCY AND DURATION 01/04/2015  Speech Therapy Frequency (ACUTE ONLY) min 2x/week  Treatment Duration 1 week     Pertinent Vitals/Pain none    SLP Swallow Goals No flowsheet data found.  No flowsheet data found.    CHL IP REASON FOR REFERRAL 01/23/2015  Reason for Referral Objectively evaluate swallowing function     CHL IP ORAL PHASE 01/23/2015  Lips (None)  Tongue (None)  Mucous membranes (None)  Nutritional status (None)  Other (None)  Oxygen therapy (None)  Oral Phase WFL  Oral - Pudding Teaspoon (None)  Oral - Pudding Cup (None)  Oral - Honey Teaspoon (None)  Oral - Honey Cup (None)  Oral - Honey Syringe (None)  Oral - Nectar Teaspoon (None)  Oral - Nectar Cup (None)  Oral - Nectar Straw (None)  Oral - Nectar Syringe (None)  Oral - Ice Chips (None)  Oral - Thin Teaspoon (None)  Oral - Thin Cup (None)  Oral - Thin Straw (None)  Oral - Thin Syringe (None)  Oral - Puree (None)  Oral - Mechanical Soft (None)  Oral - Regular (None)  Oral - Multi-consistency (None)  Oral - Pill (None)  Oral Phase - Comment (None)      CHL IP PHARYNGEAL PHASE 01/23/2015  Pharyngeal Phase Impaired  Pharyngeal - Pudding Teaspoon (None)  Penetration/Aspiration details (pudding teaspoon) (None)  Pharyngeal - Pudding Cup (None)  Penetration/Aspiration details (pudding cup) (None)  Pharyngeal - Honey Teaspoon (None)  Penetration/Aspiration details (honey teaspoon) (None)  Pharyngeal - Honey Cup (None)  Penetration/Aspiration details (honey cup) (None)  Pharyngeal - Honey Syringe (None)  Penetration/Aspiration details (honey syringe) (None)  Pharyngeal - Nectar Teaspoon (None)  Penetration/Aspiration details (nectar teaspoon) (None)  Pharyngeal - Nectar Cup (None)  Penetration/Aspiration  details (nectar cup) (None)  Pharyngeal - Nectar Straw (None)  Penetration/Aspiration details (nectar straw) (None)  Pharyngeal - Nectar Syringe (None)  Penetration/Aspiration details (nectar syringe) (None)  Pharyngeal - Ice Chips (None)  Penetration/Aspiration details (ice chips) (None)  Pharyngeal - Thin Teaspoon (None)  Penetration/Aspiration details (thin teaspoon) (None)  Pharyngeal - Thin Cup (None)  Penetration/Aspiration details (thin cup) (None)  Pharyngeal - Thin Straw (None)  Penetration/Aspiration details (thin straw) (None)  Pharyngeal - Thin Syringe (None)  Penetration/Aspiration details (thin syringe') (None)  Pharyngeal - Puree (None)  Penetration/Aspiration details (puree) (None)  Pharyngeal - Mechanical Soft (None)  Penetration/Aspiration details (mechanical soft) (None)  Pharyngeal - Regular (None)  Penetration/Aspiration details (regular) (None)  Pharyngeal - Multi-consistency (None)  Penetration/Aspiration details (multi-consistency) (None)  Pharyngeal - Pill (None)  Penetration/Aspiration details (pill) (None)  Pharyngeal Comment (None)      No flowsheet data found.  CHL IP GO 01/23/2015  Functional Assessment Tool Used skilled clinical judgement   Functional Limitations Swallowing  Swallow Current Status [phone]) CK  Swallow Goal Status (X9147) CK  Swallow Discharge Status (W2956) CK  Motor Speech Current Status (O1308) (None)  Motor Speech Goal Status (M5784) (None)  Motor Speech Goal Status (O9629) (None)  Spoken Language Comprehension Current Status (B2841) (None)  Spoken Language Comprehension Goal Status [phone]) (None)  Spoken Language Comprehension Discharge Status [phone]) (None)  Spoken Language Expression Current Status [phone]) (None)  Spoken Language Expression Goal Status [phone]) (None)  Spoken Language Expression Discharge Status [phone]) (None)  Attention Current Status (D6644) (None)  Attention Goal Status (I3474) (None)  Attention  Discharge Status [phone]) (None)  Memory Current Status (L8756) (None)  Memory Goal Status (E3329) (None)  Memory Discharge Status (J1884) (None)  Voice Current Status (Z6606) (None)  Voice Goal Status (T0160) (None)  Voice Discharge Status (F0932) (None)  Other Speech-Language Pathology Functional Limitation [phone]) (None)  Other Speech-Language Pathology Functional Limitation Goal Status (U2025) (None)  Other Speech-Language Pathology Functional Limitation Discharge Status [phone]) (None)           Houston Siren 01/23/2015, 2:01 PM   Orbie Pyo Colvin Caroli.Ed [employer] [phone]

## 2015-01-27 ENCOUNTER — Encounter: Payer: Self-pay | Admitting: Gastroenterology

## 2015-01-27 ENCOUNTER — Ambulatory Visit (AMBULATORY_SURGERY_CENTER): Payer: Medicare Other | Admitting: Gastroenterology

## 2015-01-27 ENCOUNTER — Other Ambulatory Visit: Payer: Self-pay | Admitting: Gastroenterology

## 2015-01-27 VITALS — BP 136/63 | HR 49 | Temp 96.8°F | Resp 15 | Ht 68.0 in | Wt 217.0 lb

## 2015-01-27 DIAGNOSIS — K5730 Diverticulosis of large intestine without perforation or abscess without bleeding: Secondary | ICD-10-CM

## 2015-01-27 DIAGNOSIS — K648 Other hemorrhoids: Secondary | ICD-10-CM

## 2015-01-27 DIAGNOSIS — D123 Benign neoplasm of transverse colon: Secondary | ICD-10-CM

## 2015-01-27 DIAGNOSIS — D122 Benign neoplasm of ascending colon: Secondary | ICD-10-CM | POA: Diagnosis not present

## 2015-01-27 DIAGNOSIS — K635 Polyp of colon: Secondary | ICD-10-CM | POA: Diagnosis not present

## 2015-01-27 DIAGNOSIS — Z86010 Personal history of colonic polyps: Secondary | ICD-10-CM

## 2015-01-27 DIAGNOSIS — I10 Essential (primary) hypertension: Secondary | ICD-10-CM | POA: Diagnosis not present

## 2015-01-27 DIAGNOSIS — Z8509 Personal history of malignant neoplasm of other digestive organs: Secondary | ICD-10-CM

## 2015-01-27 MED ORDER — SODIUM CHLORIDE 0.9 % IV SOLN: 500.0000 mL | INTRAVENOUS | Status: DC

## 2015-01-27 NOTE — Progress Notes (Signed)
Called to room to assist during endoscopic procedure.  Patient ID and intended procedure confirmed with present staff. Received instructions for my participation in the procedure from the performing physician.  

## 2015-01-27 NOTE — Op Note (Signed)
[facility]  [address] [place], [id number]   COLONOSCOPY PROCEDURE REPORT  PATIENT: [name], [name]  MR#: [id number] BIRTHDATE: 05-21-1932 , 82  yrs. old GENDER: male ENDOSCOPIST: Inda Castle, MD REFERRED KD:[id number] Celedonio Savage, M.D. PROCEDURE DATE:  01/27/2015 PROCEDURE:   Colonoscopy, surveillance , Colonoscopy with snare polypectomy, and Colonoscopy with cold biopsy polypectomy First Screening Colonoscopy - Avg.  risk and is 50 yrs.  old or older - No.  Prior Negative Screening - Now for repeat screening. N/A  History of Adenoma - Now for follow-up colonoscopy & has been > or = to 3 yrs.  Yes hx of adenoma.  Has been 3 or more years since last colonoscopy.  Polyps removed today? Yes ASA CLASS:   Class II INDICATIONS:high risk patient with personal history of colon cancer. 1988 MEDICATIONS: Monitored anesthesia care and Propofol 250 mg IV  DESCRIPTION OF PROCEDURE:   After the risks benefits and alternatives of the procedure were thoroughly explained, informed consent was obtained.  The digital rectal exam revealed no abnormalities of the rectum.   The [id number]  endoscope was introduced through the anus and advanced to the cecum, which was identified by both the appendix and ileocecal valve. No adverse events experienced.   The quality of the prep was (Suprep was used) good.  The instrument was then slowly withdrawn as the colon was fully examined. Estimated blood loss is zero unless otherwise noted in this procedure report.      COLON FINDINGS: There was mild diverticulosis noted in the descending colon.   There was evidence of a prior surgical anastomosis in the sigmoid colon.   Internal hemorrhoids were found.   Four sessile polyps ranging from 5 to 17mm in size were found in the transverse colon, at the hepatic flexure, and in the ascending colon.  Polypectomies were performed with a cold snare. The resection was complete, the polyp  tissue was completely retrieved and sent to histology.   A sessile polyp measuring 2 mm in size was found at the hepatic flexure.  A polypectomy was performed with cold forceps.  Retroflexed views revealed no abnormalities. The time to cecum = 3.5 Withdrawal time = 21.0   The scope was withdrawn and the procedure completed. COMPLICATIONS: There were no immediate complications.  ENDOSCOPIC IMPRESSION: 1.   Mild diverticulosis was noted in the descending colon 2.   There was evidence of a prior surgical anastomosis in the sigmoid colon 3.   Internal hemorrhoids 4.   Four sessile polyps ranging from 5 to 25mm in size were found in the transverse colon, at the hepatic flexure, and in the ascending colon; polypectomies were performed with a cold snare 5.   Sessile polyp was found at the hepatic flexure; polypectomy was performed with cold forceps  RECOMMENDATIONS: Given your age, you will not need another colonoscopy for colon cancer screening or polyp surveillance.  These types of tests usually stop around the age 19.  eSigned:  Inda Castle, MD 01/27/2015 2:20 PM   cc:   PATIENT NAME:  [name], Michael MR#: [id number]

## 2015-01-27 NOTE — Patient Instructions (Addendum)
Impressions/recommendations:  Polyps (handout given) Diverticulosis (handout given) High Fiber Diet (handout given) Hemorrhoids (handout given)  Do not take NSAIDS, aspirin or aspirin containing products for two weeks. May resume June 1. Tylenol only.  YOU HAD AN ENDOSCOPIC PROCEDURE TODAY AT [facility]:   Refer to the procedure report that was given to you for any specific questions about what was found during the examination.  If the procedure report does not answer your questions, please call your gastroenterologist to clarify.  If you requested that your care partner not be given the details of your procedure findings, then the procedure report has been included in a sealed envelope for you to review at your convenience later.  YOU SHOULD EXPECT: Some feelings of bloating in the abdomen. Passage of more gas than usual.  Walking can help get rid of the air that was put into your GI tract during the procedure and reduce the bloating. If you had a lower endoscopy (such as a colonoscopy or flexible sigmoidoscopy) you may notice spotting of blood in your stool or on the toilet paper. If you underwent a bowel prep for your procedure, you may not have a normal bowel movement for a few days.  Please Note:  You might notice some irritation and congestion in your nose or some drainage.  This is from the oxygen used during your procedure.  There is no need for concern and it should clear up in a day or so.  SYMPTOMS TO REPORT IMMEDIATELY:   Following lower endoscopy (colonoscopy or flexible sigmoidoscopy):  Excessive amounts of blood in the stool  Significant tenderness or worsening of abdominal pains  Swelling of the abdomen that is new, acute  Fever of 100F or higher  For urgent or emergent issues, a gastroenterologist can be reached at any hour by calling [phone].   DIET: Your first meal following the procedure should be a small meal and then it is ok to progress to  your normal diet. Heavy or fried foods are harder to digest and may make you feel nauseous or bloated.  Likewise, meals heavy in dairy and vegetables can increase bloating.  Drink plenty of fluids but you should avoid alcoholic beverages for 24 hours.  ACTIVITY:  You should plan to take it easy for the rest of today and you should NOT DRIVE or use heavy machinery until tomorrow (because of the sedation medicines used during the test).    FOLLOW UP: Our staff will call the number listed on your records the next business day following your procedure to check on you and address any questions or concerns that you may have regarding the information given to you following your procedure. If we do not reach you, we will leave a message.  However, if you are feeling well and you are not experiencing any problems, there is no need to return our call.  We will assume that you have returned to your regular daily activities without incident.  If any biopsies were taken you will be contacted by phone or by letter within the next 1-3 weeks.  Please call us at [phone] if you have not heard about the biopsies in 3 weeks.    SIGNATURES/CONFIDENTIALITY: You and/or your care partner have signed paperwork which will be entered into your electronic medical record.  These signatures attest to the fact that that the information above on your After Visit Summary has been reviewed and is understood.  Full responsibility of the confidentiality  of this discharge information lies with you and/or your care-partner.

## 2015-01-27 NOTE — Progress Notes (Signed)
  [facility] Post-op Note  Patient: Michael [name]  Procedure(s) Performed: colonoscopy  Patient Location: [facility] - Recovery Area  Anesthesia Type: Deep Sedation/Propofol  Level of Consciousness: awake, oriented and patient cooperative  Airway and Oxygen Therapy: Patient Spontanous Breathing  Post-op Pain: none  Post-op Assessment:  Post-op Vital signs reviewed, Patient's Cardiovascular Status Stable, Respiratory Function Stable, Patent Airway, No signs of Nausea or vomiting and Pain level controlled  Post-op Vital Signs: Reviewed and stable  Complications: No apparent anesthesia complications  Ines Warf E 2:10 PM

## 2015-01-28 ENCOUNTER — Telehealth: Payer: Self-pay | Admitting: *Deleted

## 2015-01-28 NOTE — Telephone Encounter (Signed)
  Follow up Call-  Call back number 01/27/2015  Post procedure Call Back phone  # [phone]  Permission to leave phone message Yes    Spoke with wife, he was lying down Patient questions:  Do you have a fever, pain , or abdominal swelling? No. Pain Score  0 *  Have you tolerated food without any problems? Yes.    Have you been able to return to your normal activities? Yes.    Do you have any questions about your discharge instructions: Diet   No. Medications  No. Follow up visit  No.  Do you have questions or concerns about your Care? No.  Actions: * If pain score is 4 or above: No action needed, pain <4.

## 2015-02-03 DIAGNOSIS — L820 Inflamed seborrheic keratosis: Secondary | ICD-10-CM | POA: Diagnosis not present

## 2015-02-03 DIAGNOSIS — D225 Melanocytic nevi of trunk: Secondary | ICD-10-CM | POA: Diagnosis not present

## 2015-02-03 DIAGNOSIS — L570 Actinic keratosis: Secondary | ICD-10-CM | POA: Diagnosis not present

## 2015-02-03 DIAGNOSIS — L821 Other seborrheic keratosis: Secondary | ICD-10-CM | POA: Diagnosis not present

## 2015-02-06 ENCOUNTER — Encounter: Payer: Self-pay | Admitting: Gastroenterology

## 2015-02-13 ENCOUNTER — Encounter: Payer: Self-pay | Admitting: Family Medicine

## 2015-02-13 ENCOUNTER — Ambulatory Visit (INDEPENDENT_AMBULATORY_CARE_PROVIDER_SITE_OTHER): Payer: Medicare Other | Admitting: Family Medicine

## 2015-02-13 ENCOUNTER — Ambulatory Visit (INDEPENDENT_AMBULATORY_CARE_PROVIDER_SITE_OTHER)
Admission: RE | Admit: 2015-02-13 | Discharge: 2015-02-13 | Disposition: A | Discharge status: Home / Self Care | Payer: Medicare Other | Source: Ambulatory Visit | Attending: Family Medicine | Admitting: Family Medicine

## 2015-02-13 VITALS — BP 90/54 | HR 46 | Temp 98.2°F | Ht 68.5 in | Wt 216.5 lb

## 2015-02-13 DIAGNOSIS — J690 Pneumonitis due to inhalation of food and vomit: Secondary | ICD-10-CM | POA: Diagnosis not present

## 2015-02-13 DIAGNOSIS — R05 Cough: Secondary | ICD-10-CM | POA: Diagnosis not present

## 2015-02-13 NOTE — Progress Notes (Signed)
Pre visit review using our clinic review tool, if applicable. No additional management support is needed unless otherwise documented below in the visit note. 

## 2015-02-13 NOTE — Patient Instructions (Signed)
Try mucinex DM for cough and to move mucus.  No sign of any pneumonia or aspiration pneumonia or mass on chest X-ray today.

## 2015-02-13 NOTE — Progress Notes (Signed)
showing Subjective:    Patient ID: Michael [name], male    DOB: Mar 23, 1932, 81 y.o.   MRN: [id number]  Cough   81 year old male pt of Dr. Lillie Fragmin with extensive PMH detailed below presents with cough.  Recent hospital admission on 4/23 to 4/26 for aspiration pneumonia. Also 2 proceeding episodes of PNA in last 6 months treated with broad-spectrum antibiotics including vancomycin  From 2/13 - 11/02/2014 he was admitted in [place], [place] with pneumonia and was extremely ill, hypoxic to the 70s at the time of admission and he was having a large amount of hemoptysis. During this hospitalization, the patient actually was placed on BiPAP for 3 days. .  Followed up with Dr. Lorelei Pont his PCP on 01/14/2015 referred to GI .Marland Kitchen For eval of dysphagia.  Planned repeat CXR in early June.  Saw GI on 01/19/2015  Recommended antireflux regimen, had a modified barium swallow non-revealing. Esophagram without frank aspiration. Dr. Deatra Ina did not recommend  EGD.Marland Kitchen Recommended speech path referral for swallowing and clearing throat.   TODAY: Pt reports in last 2-3 days he has been coughing more frequently few times a day. Discolored mucus, thicker than usual.  No blood in sputum.  No wheezing, no shortness of breath. He usual has wheezing with infection starting. No fever or chest pain. Feels well overall.  Using thickener in OJ.  Not using any med for symptoms. No allergy symptoms.   Wife is concerned and infection may be starting.    Social History /Family History/Past Medical History reviewed and updated if needed. HTN,  SCT on flecanideCKD stage 3  GFR 30-59, colon cancer  No hxx of chronic lung disease Review of Systems  Respiratory: Positive for cough.        Objective:   Physical Exam  Constitutional: Vital signs are normal. He appears well-developed and well-nourished.  Non-toxic appearance. He does not appear ill. No distress.  Elderly male in NAD  HENT:  Head: Normocephalic and  atraumatic.  Right Ear: Hearing, tympanic membrane, external ear and ear canal normal. No tenderness. No foreign bodies. Tympanic membrane is not retracted and not bulging.  Left Ear: Hearing, tympanic membrane, external ear and ear canal normal. No tenderness. No foreign bodies. Tympanic membrane is not retracted and not bulging.  Nose: Nose normal. No mucosal edema or rhinorrhea. Right sinus exhibits no maxillary sinus tenderness and no frontal sinus tenderness. Left sinus exhibits no maxillary sinus tenderness and no frontal sinus tenderness.  Mouth/Throat: Uvula is midline, oropharynx is clear and moist and mucous membranes are normal. Normal dentition. No dental caries. No oropharyngeal exudate or tonsillar abscesses.  Eyes: Conjunctivae, EOM and lids are normal. Pupils are equal, round, and reactive to light. Lids are everted and swept, no foreign bodies found.  Neck: Trachea normal, normal range of motion and phonation normal. Neck supple. Carotid bruit is not present. No thyroid mass and no thyromegaly present.  Cardiovascular: Normal rate, regular rhythm, S1 normal, S2 normal, normal heart sounds, intact distal pulses and normal pulses.  Exam reveals no gallop.   No murmur heard. No peripheral edema  Pulmonary/Chest: Effort normal and breath sounds normal. No respiratory distress. He has no wheezes. He has no rhonchi. He has no rales.  Abdominal: Soft. Normal appearance and bowel sounds are normal. There is no hepatosplenomegaly. There is no tenderness. There is no rebound, no guarding and no CVA tenderness. No hernia.  Neurological: He is alert. He has normal reflexes.  Skin: Skin is warm, dry  and intact. No rash noted.  Psychiatric: He has a normal mood and affect. His speech is normal and behavior is normal. Judgment normal.          Assessment & Plan:  Pt with recent complicated medical history. Due for follow up CXR for aspiration PNA resolution.  Increase in cough and  congestion, no other symptoms in last 2-3 days.  CXR clear. No sign of bacterial infection. Lung exam nml  Likely viral URI. Symptomatic care.  ? Aspiration: MBS was unrevealing, no clear aspiration. Pt using thickened liquids as he feel he swallows these better. Recent speech therapy done for swallowing per pt.

## 2015-03-20 ENCOUNTER — Other Ambulatory Visit: Payer: Self-pay | Admitting: Cardiovascular Disease

## 2015-03-23 ENCOUNTER — Ambulatory Visit: Payer: Medicare Other | Admitting: Gastroenterology

## 2015-04-22 DIAGNOSIS — M4806 Spinal stenosis, lumbar region: Secondary | ICD-10-CM | POA: Diagnosis not present

## 2015-05-04 ENCOUNTER — Other Ambulatory Visit: Payer: Self-pay | Admitting: Cardiovascular Disease

## 2015-05-19 ENCOUNTER — Other Ambulatory Visit: Payer: Self-pay | Admitting: Cardiovascular Disease

## 2015-07-15 ENCOUNTER — Encounter: Payer: Self-pay | Admitting: Gastroenterology

## 2015-07-22 ENCOUNTER — Other Ambulatory Visit: Payer: Self-pay | Admitting: Family Medicine

## 2015-07-22 NOTE — Telephone Encounter (Signed)
Please call and schedule CPE with fasting labs prior for Dr. Copland.  

## 2015-07-23 NOTE — Telephone Encounter (Signed)
Left message asking pt to call office  °

## 2015-07-24 ENCOUNTER — Other Ambulatory Visit: Payer: Self-pay | Admitting: Family Medicine

## 2015-07-25 ENCOUNTER — Other Ambulatory Visit: Payer: Self-pay | Admitting: Cardiovascular Disease

## 2015-07-28 ENCOUNTER — Ambulatory Visit: Payer: Medicare Other

## 2015-07-29 ENCOUNTER — Ambulatory Visit (INDEPENDENT_AMBULATORY_CARE_PROVIDER_SITE_OTHER): Payer: Medicare Other | Admitting: Family Medicine

## 2015-07-29 ENCOUNTER — Other Ambulatory Visit: Payer: Self-pay | Admitting: Family Medicine

## 2015-07-29 ENCOUNTER — Encounter: Payer: Self-pay | Admitting: Family Medicine

## 2015-07-29 ENCOUNTER — Ambulatory Visit: Payer: Medicare Other | Admitting: Family Medicine

## 2015-07-29 DIAGNOSIS — R269 Unspecified abnormalities of gait and mobility: Secondary | ICD-10-CM | POA: Diagnosis not present

## 2015-07-29 DIAGNOSIS — R42 Dizziness and giddiness: Secondary | ICD-10-CM | POA: Diagnosis not present

## 2015-07-29 DIAGNOSIS — R5383 Other fatigue: Secondary | ICD-10-CM | POA: Diagnosis not present

## 2015-07-29 DIAGNOSIS — I10 Essential (primary) hypertension: Secondary | ICD-10-CM

## 2015-07-29 DIAGNOSIS — N183 Chronic kidney disease, stage 3 unspecified: Secondary | ICD-10-CM

## 2015-07-29 DIAGNOSIS — Z23 Encounter for immunization: Secondary | ICD-10-CM | POA: Diagnosis not present

## 2015-07-29 NOTE — Progress Notes (Signed)
Dr. Karleen Hampshire T. Daesia Zylka, MD, CAQ Sports [facility] [address] [place], [address] Phone: [phone] Fax: [phone]  07/29/2015  Patient: Michael [name], MRN: [id number], DOB: [birth year], 81 y.o.  Primary Physician:  Michael Beat, MD   No chief complaint on file.  Subjective:   Michael [name] is a 81 y.o. very pleasant male patient who presents with the following:  Multiple c/o:  Chronic kidney disease stage 3.  Respond to questions from the patient, and he has had CKD stage 3 since he came to my practice in 2009 with CKD on prior physicians notes from [place] [facility].  Had 2 bouts of pneumonia and hard time bouncing back.   Balance problem is much worse and floats all over the place. Has to concentrate and with walking will feel like floating and unstable. With bending over - will feel unstable.   Will get dizzy and tired.  Gets tired very rapidly.   Wants bloodwork.   ? Instability. Is there a possible cause. Now is having some word recognition problems.   Concern about falls.   Gait training  Past Medical History, Surgical History, Social History, Family History, Problem List, Medications, and Allergies have been reviewed and updated if relevant.  Patient Active Problem List   Diagnosis Date Noted  . Hiatal hernia 01/06/2015  . Aspiration pneumonia (HCC) 01/03/2015  . Dysphasia 01/03/2015  . Hemoptysis 01/03/2015  . SVT (supraventricular tachycardia) (HCC) 04/18/2014  . 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 Impotence of organic origin   . Orthostatic hypotension 05/10/2012  . OA (osteoarthritis) of knee 03/05/2012  . Essential hypertension 01/08/2010  . BENIGN PROSTATIC HYPERTROPHY, WITH URINARY OBSTRUCTION 01/07/2010  . HEARING LOSS, UNSPEC. 12/08/2008  . GAIT DISTURBANCE 12/08/2008  . Hyperlipidemia LDL goal <100 07/23/2008  . GERD 07/23/2008  . CKD (chronic kidney disease) stage 3, GFR 30-59 ml/min  07/23/2008  . Colon cancer (HCC) 07/22/2008    Past Medical History  Diagnosis Date  . Colon cancer [facility]) 1988    Surgery alone, no history of chemo  . Ventral hernia 1988  . HLD (hyperlipidemia)   . GERD (gastroesophageal reflux disease)   . Rotator cuff rupture     Right; s/p repair  . BPH (benign prostatic hypertrophy)   . Abnormality of gait   . Unspecified hearing loss   . Impotence of organic origin   . HTN (hypertension)   . Renal insufficiency     stones  . Arthritis   . Chronic back pain   . 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 HOH (hard of hearing)     bilateral hearing aids  . SVT (supraventricular tachycardia) (HCC)   . GERD (gastroesophageal reflux disease)   . Dysphagia   . H/O calcium pyrophosphate deposition disease (CPPD)   . Hiatal hernia 01/06/2015  . Diverticulosis   . Aspiration pneumonia [facility]     Past Surgical History  Procedure Laterality Date  . Hemicoloectomy w/ anastomosis  1988    Michael [name]  . Umbilical hernia repair  [facility], x 4  . Rotator cuff repair Right      [facility]  . Vein ligation and stripping Right   . Eye surgery Bilateral     cataract extraction with IOL bilaterally  . Inguinal hernia repair Right 1963  . Rotator cuff repair Right   . Total knee arthroplasty  03/05/2012    Procedure: TOTAL KNEE ARTHROPLASTY;  Surgeon: Michael Drilling, MD;  Location: [facility];  Service: Orthopedics;  Laterality: Left;  Marland Kitchen Mastectomy, partial Left 02/07/2013    Procedure: LEFT SUBCUTANEOUS MASTECTOMY;  Surgeon: Michael Arms. Corliss Skains, MD;  Location: [facility];  Service: General;  Laterality: Left;    Social History   Social History  . Marital Status: Married    Spouse Name: Michael [name]  . Number of Children: 3  . Years of Education: college   Occupational History  . Retired    Social History Main Topics  . Smoking status: Never Smoker   . Smokeless tobacco: Never Used  . Alcohol Use: 1.2 oz/week    0  Standard drinks or equivalent, 2 Glasses of wine per week     Comment: Wine (1/2-1 glass 2x/week)  . Drug Use: No  . Sexual Activity: Not on file   Other Topics Concern  . Not on file   Social History Narrative   Married.  Independent with ADLs/ambulation.         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 Osteoarthritis Father   . Congestive Heart Failure Mother     No Known Allergies  Medication list reviewed and updated in full in [facility].   GEN: No acute illnesses, no fevers, chills. GI: No n/v/d, eating normally Pulm: No SOB Interactive and getting along well at home.  Otherwise, ROS is as per the HPI.  Objective:   There were no vitals taken for this visit.  GEN: WDWN, NAD, Non-toxic, A & O x 3 HEENT: Atraumatic, Normocephalic. Neck supple. No masses, No LAD. Ears and Nose: No external deformity. CV: RRR, No M/G/R. No JVD. No thrill. No extra heart sounds. PULM: CTA B, no wheezes, crackles, rhonchi. No retractions. No resp. distress. No accessory muscle use. EXTR: No c/c/e NEURO Normal gait.  PSYCH: Normally interactive. Conversant. Not depressed or anxious appearing.  Calm demeanor.   Laboratory and Imaging Data:NEUROIMAGING REPORT   STUDY DATE: 03/11/14 PATIENT NAME: Michael [name] DOB: 07/17/1932 MRN: [id number]  ORDERING CLINICIAN: York Spaniel, MD  CLINICAL HISTORY: 81 year old male with dizziness.  EXAM: MRA head (without)  TECHNIQUE: MR angiogram of the head was obtained utilizing 3D time of flight sequences from below the vertebrobasilar junction up to the intracranial vasculature without contrast. Computerized reconstructions were obtained. CONTRAST: no IMAGING SITE: [facility] [address] (1.5 Tesla MRI)   FINDINGS:  This study is of adequate technical quality. Flow signal of the bilateral internal carotid arteries have no stenosis. The bilateral middle and anterior cerebral arteries have no stenosis. The bilateral vertebral, basilar, bilateral posterior cerebral arteries have no stenosis. No aneurysmal dilatations are seen.   IMPRESSION:  Normal MRA head (without).   INTERPRETING PHYSICIAN:  Suanne Marker, MD Certified in Neurology, Neurophysiology and Neuroimaging  [facility] [address] [place], [place] [address] [phone]  NEUROIMAGING REPORT   STUDY DATE: 03/11/14 PATIENT NAME: DIAMONTE TUGMAN DOB: [birth year] MRN: [id number]  ORDERING CLINICIAN: York Spaniel, MD  CLINICAL HISTORY: 81 year old  male with dizziness.  EXAM: MRI brain (without)  TECHNIQUE: MRI of the brain without contrast was obtained utilizing 5 mm axial slices with T1, T2, T2 flair, SWI and diffusion weighted views. T1 sagittal and T2 coronal views were obtained. CONTRAST: no IMAGING SITE: [facility] [address] (1.5 Tesla MRI)   FINDINGS:  No abnormal lesions are seen on diffusion-weighted views to suggest acute ischemia. The cortical sulci, fissures and cisterns are notable for mild perisylvian atrophy. Lateral, third and fourth ventricle are normal in size and appearance. No extra-axial fluid collections are seen. No evidence of mass effect or midline shift. Few punctate foci of non-specific periventricular and subcortical gliosis.  On sagittal views the posterior fossa, pituitary gland and corpus callosum are unremarkable. No evidence of intracranial hemorrhage on SWI views. The orbits and their contents, paranasal sinuses and calvarium are notable for post-surgical orbits. Intracranial flow voids are present.   IMPRESSION:  Equivocal MRI brain (without) demonstrating a few punctate foci of non-specific  periventricular and subcortical gliosis. No acute findings. No change from MRI on 12/09/08.    INTERPRETING PHYSICIAN:  Suanne Marker, MD Certified in Neurology, Neurophysiology and Neuroimaging  [facility] [address], [address] [place], [place] [address] [phone]   Assessment and Plan:   Dizziness - Plan: CBC with Differential/Platelet, Basic metabolic panel, Vitamin B12, Hepatic function panel, TSH, VITAMIN D 25 Hydroxy (Vit-D Deficiency, Fractures), Ferritin, CANCELED: Ambulatory referral to Physical Therapy  CKD (chronic kidney disease) stage 3, GFR 30-59 ml/min - Plan: Basic metabolic panel  Other fatigue - Plan: CBC with Differential/Platelet, Basic metabolic panel, Vitamin B12, Hepatic function panel, TSH, VITAMIN D 25 Hydroxy (Vit-D Deficiency, Fractures), Ferritin, CANCELED: Ambulatory referral to Physical Therapy  Gait disturbance - Plan: CBC with Differential/Platelet, Basic metabolic panel, Vitamin B12, Hepatic function panel, TSH, VITAMIN D 25 Hydroxy (Vit-D Deficiency, Fractures), Ferritin, CANCELED: Ambulatory referral to Physical Therapy  Need for prophylactic vaccination and inoculation against influenza - Plan: Flu Vaccine QUAD 36+ mos IM  >25 minutes spent in face to face time with patient, >50% spent in counselling or coordination of care   We have worked these up in the past before with normal MRI, MRA, and neither ENT, Neurology, or Cardiology feel like they relate to those systems. I tried to reassure him and recommended balance training through PT, but he refused.   CKD ongoing since before he came to my practice, and in the past i have used terms like "renal insufficiency," or other terms meaning the same thing - i reviewed this data and his stable GFR with the patient.   Follow-up: No Follow-up on file.  Orders Placed This Encounter  Procedures  . Flu Vaccine QUAD 36+ mos IM  . CBC with Differential/Platelet  . Basic  metabolic panel  . Vitamin B12  . Hepatic function panel  . TSH  . VITAMIN D 25 Hydroxy (Vit-D Deficiency, Fractures)  . Ferritin    Signed,  Michael Hampshire T. Jaeger Trueheart, MD   Patient's Medications  New Prescriptions   No medications on file  Previous Medications   CALCIUM CARBONATE (OS-CAL) 600 MG TABS TABLET    Take 600 mg by mouth daily with breakfast.   DILTIAZEM (CARDIZEM CD) 120 MG 24 HR CAPSULE    TAKE ONE CAPSULE BY MOUTH DAILY   FLECAINIDE (TAMBOCOR) 100 MG TABLET    TAKE 1 TABLET BY MOUTH TWICE A DAY   LOVASTATIN (MEVACOR) 20 MG TABLET    TAKE ONE TABLET  BY MOUTH ONCE DAILY   METOPROLOL TARTRATE (LOPRESSOR) 25 MG TABLET    TAKE 1 TABLET BY MOUTH TWICE A DAY   MULTIPLE VITAMINS-MINERALS (CENTRUM SILVER ADULT 50+ PO)    Take 1 tablet by mouth daily.   OMEPRAZOLE (PRILOSEC) 20 MG CAPSULE    TAKE 1 CAPSULE BY MOUTH EVERY DAY AS NEEDED.   OXYCODONE-ACETAMINOPHEN (PERCOCET) 10-325 MG PER TABLET    Take 1 tablet by mouth every 6 (six) hours as needed for pain (pain).    SILDENAFIL (REVATIO) 20 MG TABLET    Generic Revatio / Sildanefil 20 mg. 2 - 5 tabs 30 mins prior to intercourse.   TRAZODONE (DESYREL) 50 MG TABLET    Take 0.5-1 tablets (25-50 mg total) by mouth at bedtime as needed for sleep.  Modified Medications   No medications on file  Discontinued Medications   No medications on file

## 2015-07-29 NOTE — Progress Notes (Signed)
Patient ID: Michael [name], male   DOB: [birth year], 81 y.o.   MRN: [id number] Pre visit review using our clinic review tool, if applicable. No additional management support is needed unless otherwise documented below in the visit note.

## 2015-07-29 NOTE — Patient Instructions (Signed)

## 2015-07-30 ENCOUNTER — Encounter: Payer: Self-pay | Admitting: Family Medicine

## 2015-07-30 LAB — CBC WITH DIFFERENTIAL/PLATELET
BASOS PCT: 0.1 % (ref 0.0–3.0)
Basophils Absolute: 0 10*3/uL (ref 0.0–0.1)
EOS ABS: 0.1 10*3/uL (ref 0.0–0.7)
Eosinophils Relative: 2.6 % (ref 0.0–5.0)
HEMATOCRIT: 39.8 % (ref 39.0–52.0)
HEMOGLOBIN: 13.3 g/dL (ref 13.0–17.0)
LYMPHS ABS: 1.3 10*3/uL (ref 0.7–4.0)
Lymphocytes Relative: 22 % (ref 12.0–46.0)
MCHC: 33.5 g/dL (ref 30.0–36.0)
MCV: 97.5 fl (ref 78.0–100.0)
MONO ABS: 0.8 10*3/uL (ref 0.1–1.0)
Monocytes Relative: 13.4 % — ABNORMAL HIGH (ref 3.0–12.0)
NEUTROS ABS: 3.6 10*3/uL (ref 1.4–7.7)
Neutrophils Relative %: 61.9 % (ref 43.0–77.0)
PLATELETS: 192 10*3/uL (ref 150.0–400.0)
RBC: 4.08 Mil/uL — ABNORMAL LOW (ref 4.22–5.81)
RDW: 14.3 % (ref 11.5–15.5)
WBC: 5.8 10*3/uL (ref 4.0–10.5)

## 2015-07-30 LAB — HEPATIC FUNCTION PANEL
ALT: 30 U/L (ref 0–53)
AST: 35 U/L (ref 0–37)
Albumin: 4.1 g/dL (ref 3.5–5.2)
Alkaline Phosphatase: 59 U/L (ref 39–117)
Bilirubin, Direct: 0.1 mg/dL (ref 0.0–0.3)
TOTAL PROTEIN: 6.6 g/dL (ref 6.0–8.3)
Total Bilirubin: 0.4 mg/dL (ref 0.2–1.2)

## 2015-07-30 LAB — VITAMIN D 25 HYDROXY (VIT D DEFICIENCY, FRACTURES): VITD: 61.69 ng/mL (ref 30.00–100.00)

## 2015-07-30 LAB — BASIC METABOLIC PANEL
BUN: 29 mg/dL — AB (ref 6–23)
CO2: 31 mEq/L (ref 19–32)
Calcium: 9.4 mg/dL (ref 8.4–10.5)
Chloride: 101 mEq/L (ref 96–112)
Creatinine, Ser: 1.61 mg/dL — ABNORMAL HIGH (ref 0.40–1.50)
GFR: 43.96 mL/min — AB (ref >60.00)
GLUCOSE: 77 mg/dL (ref 70–99)
Potassium: 4.9 mEq/L (ref 3.5–5.1)
Sodium: 141 mEq/L (ref 135–145)

## 2015-07-30 LAB — FERRITIN: FERRITIN: 26 ng/mL (ref 22.0–322.0)

## 2015-07-30 LAB — VITAMIN B12: Vitamin B-12: 662 pg/mL (ref 211–911)

## 2015-07-30 LAB — TSH: TSH: 2.05 u[IU]/mL (ref 0.35–4.50)

## 2015-08-04 ENCOUNTER — Encounter: Payer: Self-pay | Admitting: Family Medicine

## 2015-08-04 ENCOUNTER — Ambulatory Visit (INDEPENDENT_AMBULATORY_CARE_PROVIDER_SITE_OTHER): Payer: Medicare Other | Admitting: Family Medicine

## 2015-08-04 VITALS — BP 122/76 | HR 55 | Temp 98.6°F | Wt 217.2 lb

## 2015-08-04 DIAGNOSIS — J209 Acute bronchitis, unspecified: Secondary | ICD-10-CM | POA: Diagnosis not present

## 2015-08-04 MED ORDER — AMOXICILLIN-POT CLAVULANATE 875-125 MG PO TABS: 1.0000 | ORAL_TABLET | Freq: Two times a day (BID) | ORAL | Status: AC

## 2015-08-04 NOTE — Progress Notes (Signed)
BP 122/76 mmHg  Pulse 55  Temp(Src) 98.6 F (37 C) (Oral)  Wt 217 lb 4 oz (98.544 kg)  SpO2 94%   CC: congestion  Subjective:    Patient ID: Michael [name], male    DOB: [birth year], 81 y.o.   MRN: [id number]  HPI: Michael [name] is a 81 y.o. male presenting on 08/04/2015 for Sore Throat   1 wk h/o ST, cough productive of thickening yellow sputum.   No fevers/chills, ear or tooth pain, headaches, PNdrainage.   No h/o asthma or COPD. Never smoker.  No sick contacts at home.  So far has tried zyrtec.  Worried about recurrent pneumonia. Had PNA 10/2014 then again 12/2014. ?aspiration PNA.   Relevant past medical, surgical, family and social history reviewed and updated as indicated. Interim medical history since our last visit reviewed. Allergies and medications reviewed and updated. Current Outpatient Prescriptions on File Prior to Visit  Medication Sig  . calcium carbonate (OS-CAL) 600 MG TABS tablet Take 600 mg by mouth daily with breakfast.  . diltiazem (CARDIZEM CD) 120 MG 24 hr capsule TAKE ONE CAPSULE BY MOUTH DAILY  . flecainide (TAMBOCOR) 100 MG tablet TAKE 1 TABLET BY MOUTH TWICE A DAY  . lovastatin (MEVACOR) 20 MG tablet TAKE ONE TABLET BY MOUTH ONCE DAILY  . metoprolol tartrate (LOPRESSOR) 25 MG tablet TAKE 1 TABLET BY MOUTH TWICE A DAY  . Multiple Vitamins-Minerals (CENTRUM SILVER ADULT 50+ PO) Take 1 tablet by mouth daily.  Marland Kitchen omeprazole (PRILOSEC) 20 MG capsule TAKE 1 CAPSULE BY MOUTH EVERY DAY AS NEEDED.  Marland Kitchen oxyCODONE-acetaminophen (PERCOCET) 10-325 MG per tablet Take 1 tablet by mouth every 6 (six) hours as needed for pain (pain).   . sildenafil (REVATIO) 20 MG tablet Generic Revatio / Sildanefil 20 mg. 2 - 5 tabs 30 mins prior to intercourse.  . traZODone (DESYREL) 50 MG tablet Take 0.5-1 tablets (25-50 mg total) by mouth at bedtime as needed for sleep.   No current facility-administered medications on file prior to visit.    Review of Systems Per HPI unless  specifically indicated in ROS section     Objective:    BP 122/76 mmHg  Pulse 55  Temp(Src) 98.6 F (37 C) (Oral)  Wt 217 lb 4 oz (98.544 kg)  SpO2 94%  Wt Readings from Last 3 Encounters:  08/04/15 217 lb 4 oz (98.544 kg)  07/29/15 217 lb 12 oz (98.771 kg)  02/13/15 216 lb 8 oz (98.204 kg)    Physical Exam  Constitutional: He appears well-developed and well-nourished. No distress.  HENT:  Head: Normocephalic and atraumatic.  Right Ear: Decreased hearing is noted.  Left Ear: Decreased hearing is noted.  Nose: Mucosal edema (nasal mucosal irritation) present. No rhinorrhea. Right sinus exhibits no maxillary sinus tenderness and no frontal sinus tenderness. Left sinus exhibits no maxillary sinus tenderness and no frontal sinus tenderness.  Mouth/Throat: Uvula is midline and mucous membranes are normal. Posterior oropharyngeal edema and posterior oropharyngeal erythema present. No oropharyngeal exudate or tonsillar abscesses.  Hearing aides in place  Eyes: Conjunctivae and EOM are normal. Pupils are equal, round, and reactive to light. No scleral icterus.  Neck: Normal range of motion. Neck supple.  Cardiovascular: Normal rate, regular rhythm, normal heart sounds and intact distal pulses.   No murmur heard. Pulmonary/Chest: Effort normal and breath sounds normal. No respiratory distress. He has no wheezes. He has no rales.  Coughs up thick green sputum  Lymphadenopathy:    He has  no cervical adenopathy.  Skin: Skin is warm and dry. No rash noted.  Nursing note and vitals reviewed.     Assessment & Plan:   Problem List Items Addressed This Visit    Acute bronchitis - Primary    Anticipate developing bronchitis - concern for bacterial infection given worsening productive cough that is developing. Will treat aggressively with augmentin antibiotic course. Update if not improving with treatment. Pt agrees with plan.          Follow up plan: Return if symptoms worsen or fail to  improve.

## 2015-08-04 NOTE — Patient Instructions (Addendum)
I think you have bronchitis - treat with augmentin and tylenol. Push fluids and rest. Ok to take PLAIN mucinex with plenty of water to help mobilize mucous. Watch for fever >101 or worsening productive cough or not improving with treatment.  Acute Bronchitis Bronchitis is inflammation of the airways that extend from the windpipe into the lungs (bronchi). The inflammation often causes mucus to develop. This leads to a cough, which is the most common symptom of bronchitis.  In acute bronchitis, the condition usually develops suddenly and goes away over time, usually in a couple weeks. Smoking, allergies, and asthma can make bronchitis worse. Repeated episodes of bronchitis may cause further lung problems.  CAUSES Acute bronchitis is most often caused by the same virus that causes a cold. The virus can spread from person to person (contagious) through coughing, sneezing, and touching contaminated objects. SIGNS AND SYMPTOMS   Cough.   Fever.   Coughing up mucus.   Body aches.   Chest congestion.   Chills.   Shortness of breath.   Sore throat.  DIAGNOSIS  Acute bronchitis is usually diagnosed through a physical exam. Your health care provider will also ask you questions about your medical history. Tests, such as chest X-rays, are sometimes done to rule out other conditions.  TREATMENT  Acute bronchitis usually goes away in a couple weeks. Oftentimes, no medical treatment is necessary. Medicines are sometimes given for relief of fever or cough. Antibiotic medicines are usually not needed but may be prescribed in certain situations. In some cases, an inhaler may be recommended to help reduce shortness of breath and control the cough. A cool mist vaporizer may also be used to help thin bronchial secretions and make it easier to clear the chest.  HOME CARE INSTRUCTIONS  Get plenty of rest.   Drink enough fluids to keep your urine clear or pale yellow (unless you have a medical  condition that requires fluid restriction). Increasing fluids may help thin your respiratory secretions (sputum) and reduce chest congestion, and it will prevent dehydration.   Take medicines only as directed by your health care provider.  If you were prescribed an antibiotic medicine, finish it all even if you start to feel better.  Avoid smoking and secondhand smoke. Exposure to cigarette smoke or irritating chemicals will make bronchitis worse. If you are a smoker, consider using nicotine gum or skin patches to help control withdrawal symptoms. Quitting smoking will help your lungs heal faster.   Reduce the chances of another bout of acute bronchitis by washing your hands frequently, avoiding people with cold symptoms, and trying not to touch your hands to your mouth, nose, or eyes.   Keep all follow-up visits as directed by your health care provider.  SEEK MEDICAL CARE IF: Your symptoms do not improve after 1 week of treatment.  SEEK IMMEDIATE MEDICAL CARE IF:  You develop an increased fever or chills.   You have chest pain.   You have severe shortness of breath.  You have bloody sputum.   You develop dehydration.  You faint or repeatedly feel like you are going to pass out.  You develop repeated vomiting.  You develop a severe headache. MAKE SURE YOU:   Understand these instructions.  Will watch your condition.  Will get help right away if you are not doing well or get worse.   This information is not intended to replace advice given to you by your health care provider. Make sure you discuss any questions you have  with your health care provider.   Document Released: 10/06/2004 Document Revised: 09/19/2014 Document Reviewed: 02/19/2013 Elsevier Interactive Patient Education [employer].

## 2015-08-04 NOTE — Assessment & Plan Note (Signed)
Anticipate developing bronchitis - concern for bacterial infection given worsening productive cough that is developing. Will treat aggressively with augmentin antibiotic course. Update if not improving with treatment. Pt agrees with plan.

## 2015-08-04 NOTE — Progress Notes (Signed)
Pre visit review using our clinic review tool, if applicable. No additional management support is needed unless otherwise documented below in the visit note. 

## 2015-08-18 ENCOUNTER — Ambulatory Visit: Payer: Medicare Other | Admitting: Physical Therapy

## 2015-08-25 ENCOUNTER — Encounter: Payer: Self-pay | Admitting: Cardiovascular Disease

## 2015-08-25 ENCOUNTER — Ambulatory Visit (INDEPENDENT_AMBULATORY_CARE_PROVIDER_SITE_OTHER): Payer: Medicare Other | Admitting: Cardiovascular Disease

## 2015-08-25 VITALS — BP 150/85 | HR 55 | Ht 70.5 in | Wt 223.8 lb

## 2015-08-25 DIAGNOSIS — I471 Supraventricular tachycardia: Secondary | ICD-10-CM | POA: Diagnosis not present

## 2015-08-25 DIAGNOSIS — E785 Hyperlipidemia, unspecified: Secondary | ICD-10-CM | POA: Diagnosis not present

## 2015-08-25 DIAGNOSIS — N183 Chronic kidney disease, stage 3 unspecified: Secondary | ICD-10-CM

## 2015-08-25 DIAGNOSIS — J209 Acute bronchitis, unspecified: Secondary | ICD-10-CM

## 2015-08-25 DIAGNOSIS — I10 Essential (primary) hypertension: Secondary | ICD-10-CM

## 2015-08-25 NOTE — Assessment & Plan Note (Signed)
Blood pressure is elevated today, improved on recheck but still mildly elevated Encouraged him to monitor his blood pressure at home No changes made to his pressures

## 2015-08-25 NOTE — Assessment & Plan Note (Signed)
Creatinine1.6, high end of his baseline Encouraged him to increase his fluids

## 2015-08-25 NOTE — Assessment & Plan Note (Signed)
Cholesterol is at goal on the current lipid regimen. No changes to the medications were made.  

## 2015-08-25 NOTE — Progress Notes (Signed)
Patient ID: Michael [name], male    DOB: 10/16/1931, 81 y.o.   MRN: [id number]  HPI Comments: Michael [name] is a pleasant 81 year old gentleman with history of total knee replacement,  renal dysfunction, who presents for followup of lightheadedness/dizziness,frequent episodes of SVT. previously seen by Dr. Rayann Heman and plan was made for SVT ablation. in the end he changed his mind and canceled the procedure He developed bronchial pneumonia shortly after that decision He presents today for follow-up of his SVT  In follow-up, he reports that he is doing well. Denies any tachycardia or lightheadedness concerning for recurrent arrhythmia or SVT Has been taking indications as detailed Did have bronchitis 3 weeks ago requiring antibiotics Reports he recovered well with no residual symptoms No significant chest pain or leg edema. Baseline creatinine 1.6 Review of other blood work shows total cholesterol 152, LDL 81 He does report having chronic fatigue. No regular exercise program but he is going to start Relatively sedentary at baseline  EKG on today's visit shows normal sinus rhythm with rate 55 beats a minute, no significant ST or T-wave changes  Other past medical history Previous 30 day monitor  showed runs of SVT, heart rate up to 177 beats per minute associated with dizziness. Continued to have runs of SVT even on diltiazem 120 mg daily   blood work showing BUN 20, creatinine 1.7. Baseline creatinine appears 1.4    No Known Allergies  Outpatient Encounter Prescriptions as of 08/25/2015  Medication Sig  . calcium carbonate (OS-CAL) 600 MG TABS tablet Take 600 mg by mouth daily with breakfast.  . diltiazem (CARDIZEM CD) 120 MG 24 hr capsule TAKE ONE CAPSULE BY MOUTH DAILY  . flecainide (TAMBOCOR) 100 MG tablet TAKE 1 TABLET BY MOUTH TWICE A DAY  . lovastatin (MEVACOR) 20 MG tablet TAKE ONE TABLET BY MOUTH ONCE DAILY  . metoprolol tartrate (LOPRESSOR) 25 MG tablet TAKE 1 TABLET BY MOUTH  TWICE A DAY  . Multiple Vitamins-Minerals (CENTRUM SILVER ADULT 50+ PO) Take 1 tablet by mouth daily.  Marland Kitchen omeprazole (PRILOSEC) 20 MG capsule TAKE 1 CAPSULE BY MOUTH EVERY DAY AS NEEDED.  Marland Kitchen oxyCODONE-acetaminophen (PERCOCET) 10-325 MG per tablet Take 1 tablet by mouth every 6 (six) hours as needed for pain (pain).   . sildenafil (REVATIO) 20 MG tablet Generic Revatio / Sildanefil 20 mg. 2 - 5 tabs 30 mins prior to intercourse.  . traZODone (DESYREL) 50 MG tablet Take 0.5-1 tablets (25-50 mg total) by mouth at bedtime as needed for sleep.   No facility-administered encounter medications on file as of 08/25/2015.    Past Medical History  Diagnosis Date  . Colon cancer [facility]) 1988    Surgery alone, no history of chemo  . Ventral hernia 1988  . HLD (hyperlipidemia)   . GERD (gastroesophageal reflux disease)   . Rotator cuff rupture     Right; s/p repair  . BPH (benign prostatic hypertrophy)   . Abnormality of gait   . Unspecified hearing loss   . Impotence of organic origin   . HTN (hypertension)   . Renal insufficiency     stones  . Arthritis   . Chronic back pain   . 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 HOH (hard of hearing)     bilateral hearing aids  . SVT (supraventricular tachycardia) ([facility])   . GERD (gastroesophageal reflux disease)   . Dysphagia   . H/O calcium pyrophosphate deposition disease (CPPD)   . Hiatal hernia 01/06/2015  .  Diverticulosis   . Aspiration pneumonia [facility])     Past Surgical History  Procedure Laterality Date  . Hemicoloectomy w/ anastomosis  [place]  . Umbilical hernia repair  [facility], x 4  . Rotator cuff repair Right      [facility]  . Vein ligation and stripping Right   . Eye surgery Bilateral     cataract extraction with IOL bilaterally  . Inguinal hernia repair Right 1963  . Rotator cuff repair Right   . Total knee arthroplasty  03/05/2012    Procedure: TOTAL KNEE ARTHROPLASTY;  Surgeon: Gearlean Alf,  MD;  Location: [facility];  Service: Orthopedics;  Laterality: Left;  Marland Kitchen Mastectomy, partial Left 02/07/2013    Procedure: LEFT SUBCUTANEOUS MASTECTOMY;  Surgeon: Imogene Burn. Georgette Dover, MD;  Location: [place];  Service: General;  Laterality: Left;    Social History  reports that he has never smoked. He has never used smokeless tobacco. He reports that he drinks about 1.2 oz of alcohol per week. He reports that he does not use illicit drugs.  Family History family history includes Congestive Heart Failure in his mother; Osteoarthritis in his father.   Review of Systems  Constitutional: Positive for fatigue.  Eyes: Negative.   Respiratory: Negative.   Cardiovascular: Negative.        Tachycardia  Gastrointestinal: Negative.   Musculoskeletal: Negative.   Neurological: Negative.   Hematological: Negative.   Psychiatric/Behavioral: Negative.   All other systems reviewed and are negative.   BP 150/85 mmHg  Pulse 55  Ht 5' 10.5" (1.791 m)  Wt 223 lb 12.8 oz (101.515 kg)  BMI 31.65 kg/m2  Physical Exam  Constitutional: He is oriented to person, place, and time. He appears well-developed and well-nourished.  HENT:  Head: Normocephalic.  Nose: Nose normal.  Mouth/Throat: Oropharynx is clear and moist.  Eyes: Conjunctivae are normal. Pupils are equal, round, and reactive to light.  Neck: Normal range of motion. Neck supple. No JVD present.  Cardiovascular: Normal rate, regular rhythm, S1 normal, S2 normal, normal heart sounds and intact distal pulses.  Exam reveals no gallop and no friction rub.   No murmur heard. Pulmonary/Chest: Effort normal and breath sounds normal. No respiratory distress. He has no wheezes. He has no rales. He exhibits no tenderness.  Abdominal: Soft. Bowel sounds are normal. He exhibits no distension. There is no tenderness.  Musculoskeletal: Normal range of motion. He exhibits no edema or tenderness.  Lymphadenopathy:    He has no cervical  adenopathy.  Neurological: He is alert and oriented to person, place, and time. Coordination normal.  Skin: Skin is warm and dry. No rash noted. No erythema.  Psychiatric: He has a normal mood and affect. His behavior is normal. Judgment and thought content normal.      Assessment and Plan   Nursing note and vitals reviewed.

## 2015-08-25 NOTE — Patient Instructions (Signed)
You are doing well. No medication changes were made.  Please monitor your blood pressure at home Call the office if it runs high Goal is <150 on the top number, <90 on the bottom  Please call us if you have new issues that need to be addressed before your next appt.  Your physician wants you to follow-up in: 6 months.  You will receive a reminder letter in the mail two months in advance. If you don't receive a letter, please call our office to schedule the follow-up appointment.

## 2015-08-25 NOTE — Assessment & Plan Note (Signed)
He denies any further episodes of arrhythmia. At least denies having any symptoms. We will continue him on his current medication regimen

## 2015-08-25 NOTE — Assessment & Plan Note (Signed)
Details discussed with him, completed his antibiotics, symptoms have resolved

## 2015-09-03 ENCOUNTER — Other Ambulatory Visit: Payer: Self-pay | Admitting: Cardiovascular Disease

## 2015-09-21 ENCOUNTER — Other Ambulatory Visit: Payer: Self-pay | Admitting: *Deleted

## 2015-09-21 ENCOUNTER — Telehealth: Payer: Self-pay | Admitting: *Deleted

## 2015-09-21 MED ORDER — METOPROLOL TARTRATE 25 MG PO TABS: 25.0000 mg | ORAL_TABLET | Freq: Two times a day (BID) | ORAL | Status: DC

## 2015-09-21 NOTE — Telephone Encounter (Signed)
Refill sent to [facility] to [facility] in  [place], [place].

## 2015-09-21 NOTE — Telephone Encounter (Signed)
°*  STAT* If patient is at the pharmacy, call can be transferred to refill team.   1. Which medications need to be refilled? (please list name of each medication and dose if known) Metoprolol 25 mg   2. Which pharmacy/location (including street and city if local pharmacy) is medication to be sent to? [facility] [place] [address]  Fax: [phone]   3. Do they need a 30 day or 90 day supply? 90 day

## 2015-09-21 NOTE — Telephone Encounter (Signed)
Requested Prescriptions   Signed Prescriptions Disp Refills   metoprolol tartrate (LOPRESSOR) 25 MG tablet 180 tablet 3    Sig: Take 1 tablet (25 mg total) by mouth 2 (two) times daily.    Authorizing Provider: GOLLAN, TIMOTHY J    Ordering User: LOPEZ, MARINA C    

## 2015-09-30 ENCOUNTER — Ambulatory Visit: Payer: Medicare Other | Admitting: Family Medicine

## 2015-10-06 ENCOUNTER — Other Ambulatory Visit: Payer: Self-pay | Admitting: *Deleted

## 2015-10-06 MED ORDER — FLECAINIDE ACETATE 100 MG PO TABS: 100.0000 mg | ORAL_TABLET | Freq: Two times a day (BID) | ORAL | Status: DC

## 2015-10-08 ENCOUNTER — Other Ambulatory Visit: Payer: Self-pay

## 2015-10-08 MED ORDER — FLECAINIDE ACETATE 100 MG PO TABS: 100.0000 mg | ORAL_TABLET | Freq: Two times a day (BID) | ORAL | Status: DC

## 2015-10-08 NOTE — Telephone Encounter (Signed)
Refill sent for Flecainide acetate 100 mg one tablet twice a day for 90 day supply.

## 2015-10-21 ENCOUNTER — Other Ambulatory Visit: Payer: Self-pay | Admitting: Family Medicine

## 2015-10-21 NOTE — Telephone Encounter (Signed)
Last office visit 08/04/15 with Dr. Darnell Level.  Last CPE 06/12/2014.  Last Lipid 06/09/2014.  Ok to refill?

## 2015-11-10 ENCOUNTER — Ambulatory Visit (INDEPENDENT_AMBULATORY_CARE_PROVIDER_SITE_OTHER): Payer: Medicare Other

## 2015-11-10 VITALS — BP 130/72 | HR 45 | Temp 97.8°F | Ht 69.75 in | Wt 221.2 lb

## 2015-11-10 DIAGNOSIS — Z0000 Encounter for general adult medical examination without abnormal findings: Secondary | ICD-10-CM | POA: Diagnosis not present

## 2015-11-10 NOTE — Patient Instructions (Addendum)
Michael [name] , Thank you for taking time to come for your Medicare Wellness Visit. I appreciate your ongoing commitment to your health goals. Please review the following plan we discussed and let me know if I can assist you in the future.   These are the goals we discussed: Goals    Starting 11/10/15, I will perform relaxation techniques before bed time.       Please review information on [place] to determine if a sleep study would be of benefit to you. Please feel free to discuss this further with Dr. Edilia Bo at your next appointment.   This is a list of the screening recommended for you and due dates:  Health Maintenance  Topic Date Due  . Tetanus Vaccine  Please check with pharmacy about cost  . Shingles Vaccine  Please check with pharmacy about cost  . Flu Shot  04/12/2016  . Pneumonia vaccines  Completed   Preventive Care for Adults  A healthy lifestyle and preventive care can promote health and wellness. Preventive health guidelines for adults include the following key practices.  . A routine yearly physical is a good way to check with your health care provider about your health and preventive screening. It is a chance to share any concerns and updates on your health and to receive a thorough exam.  . Visit your dentist for a routine exam and preventive care every 6 months. Brush your teeth twice a day and floss once a day. Good oral hygiene prevents tooth decay and gum disease.  . The frequency of eye exams is based on your age, health, family medical history, use  of contact lenses, and other factors. Follow your health care provider's ecommendations for frequency of eye exams.  . Eat a healthy diet. Foods like vegetables, fruits, whole grains, low-fat dairy products, and lean protein foods contain the nutrients you need without too many calories. Decrease your intake of foods high in solid fats, added sugars, and salt. Eat the right amount of calories for  you. Get information about a proper diet from your health care provider, if necessary.  . Regular physical exercise is one of the most important things you can do for your health. Most adults should get at least 150 minutes of moderate-intensity exercise (any activity that increases your heart rate and causes you to sweat) each week. In addition, most adults need muscle-strengthening exercises on 2 or more days a week.  . Maintain a healthy weight. The body mass index (BMI) is a screening tool to identify possible weight problems. It provides an estimate of body fat based on height and weight. Your health care provider can find your BMI and can help you achieve or maintain a healthy weight.   For adults 20 years and older: ? A BMI below 18.5 is considered underweight. ? A BMI of 18.5 to 24.9 is normal. ? A BMI of 25 to 29.9 is considered overweight. ? A BMI of 30 and above is considered obese.   . Maintain normal blood lipids and cholesterol levels by exercising and minimizing your intake of saturated fat. Eat a balanced diet with plenty of fruit and vegetables. Blood tests for lipids and cholesterol should begin at age 70 and be repeated every 5 years. If your lipid or cholesterol levels are high, you are over 50, or you are at high risk for heart disease, you may need your cholesterol levels checked more frequently. Ongoing high lipid and cholesterol levels should  be treated with medicines if diet and exercise are not working.  . If you smoke, find out from your health care provider how to quit. If you do not use tobacco, please do not start.  . If you choose to drink alcohol, please do not consume more than 2 drinks per day. One drink is considered to be 12 ounces (355 mL) of beer, 5 ounces (148 mL) of wine, or 1.5 ounces (44 mL) of liquor.  . If you are 55-59 years old, ask your health care provider if you should take aspirin to prevent strokes.  . Osteoporosis is a disease in which the  bones lose minerals and strength with aging. This can result in serious bone fractures or breaks. The risk of osteoporosis can be identified using a bone density scan. Women ages 62 years and over and women at risk for fractures or osteoporosis should discuss screening with their health care providers. Ask your health care provider whether you should take a calcium supplement or vitamin D to reduce the rate of osteoporosis.  . Use sunscreen. Apply sunscreen liberally and repeatedly throughout the day. You should seek shade when your shadow is shorter than you. Protect yourself by wearing long sleeves, pants, a wide-brimmed hat, and sunglasses year round, whenever you are outdoors.  . Once a month, do a whole body skin exam, using a mirror to look at the skin on your back. Tell your health care provider of new moles, moles that have irregular borders, moles that are larger than a pencil eraser, or moles that have changed in shape or color.

## 2015-11-10 NOTE — Progress Notes (Signed)
Pre visit review using our clinic review tool, if applicable. No additional management support is needed unless otherwise documented below in the visit note. 

## 2015-11-10 NOTE — Progress Notes (Signed)
Subjective:   Michael [name] is a 82 y.o. male who presents for Medicare Annual/Subsequent preventive examination.  Review of Systems:   No ROS.  Cardiac Risk Factors include: advanced age (>48men, >35 women);dyslipidemia;hypertension;male gender;sedentary lifestyle;obesity (BMI >30kg/m2)     Objective:    Vitals: BP 130/72 mmHg  Pulse 45  Temp(Src) 97.8 F (36.6 C) (Oral)  Ht 5' 9.75" (1.772 m)  Wt 221 lb 4 oz (100.358 kg)  BMI 31.96 kg/m2  SpO2 96%  Tobacco History  Smoking status  . Never Smoker   Smokeless tobacco  . Never Used     Counseling given: No   Past Medical History  Diagnosis Date  . Colon cancer [facility]) 1988    Surgery alone, no history of chemo  . Ventral hernia 1988  . HLD (hyperlipidemia)   . GERD (gastroesophageal reflux disease)   . Rotator cuff rupture     Right; s/p repair  . BPH (benign prostatic hypertrophy)   . Abnormality of gait   . Unspecified hearing loss   . Impotence of organic origin   . HTN (hypertension)   . Renal insufficiency     stones  . Arthritis   . Chronic back pain   . 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 HOH (hard of hearing)     bilateral hearing aids  . SVT (supraventricular tachycardia) ([place])   . GERD (gastroesophageal reflux disease)   . Dysphagia   . H/O calcium pyrophosphate deposition disease (CPPD)   . Hiatal hernia 01/06/2015  . Diverticulosis   . Aspiration pneumonia [facility]    Past Surgical History  Procedure Laterality Date  . Hemicoloectomy w/ anastomosis  [place]  . Umbilical hernia repair  [facility], x 4  . Rotator cuff repair Right      [facility]  . Vein ligation and stripping Right   . Eye surgery Bilateral     cataract extraction with IOL bilaterally  . Inguinal hernia repair Right 1963  . Rotator cuff repair Right   . Total knee arthroplasty  03/05/2012    Procedure: TOTAL KNEE ARTHROPLASTY;  Surgeon: Gearlean Alf, MD;  [facility]: [facility];  Service:  Orthopedics;  Laterality: Left;  Marland Kitchen Mastectomy, partial Left 02/07/2013    Procedure: LEFT SUBCUTANEOUS MASTECTOMY;  Surgeon: Imogene Burn. Georgette Dover, MD;  Location: [place];  Service: General;  Laterality: Left;   Family History  Problem Relation Age of Onset  . Osteoarthritis Father   . Congestive Heart Failure Mother    History  Sexual Activity  . Sexual Activity: Yes    Outpatient Encounter Prescriptions as of 11/10/2015  Medication Sig  . calcium carbonate (OS-CAL) 600 MG TABS tablet Take 600 mg by mouth daily with breakfast.  . diltiazem (CARDIZEM CD) 120 MG 24 hr capsule TAKE ONE CAPSULE BY MOUTH DAILY  . flecainide (TAMBOCOR) 100 MG tablet Take 1 tablet (100 mg total) by mouth 2 (two) times daily.  Marland Kitchen lovastatin (MEVACOR) 20 MG tablet TAKE ONE TABLET BY MOUTH ONCE DAILY  . metoprolol tartrate (LOPRESSOR) 25 MG tablet Take 1 tablet (25 mg total) by mouth 2 (two) times daily.  . Multiple Vitamins-Minerals (CENTRUM SILVER ADULT 50+ PO) Take 1 tablet by mouth daily.  Marland Kitchen omeprazole (PRILOSEC) 20 MG capsule TAKE 1 CAPSULE BY MOUTH EVERY DAY AS NEEDED.  Marland Kitchen oxyCODONE-acetaminophen (PERCOCET) 10-325 MG per tablet Take 1 tablet by mouth every 6 (six) hours as needed  for pain (pain).   . sildenafil (REVATIO) 20 MG tablet TAKE 2 TO 5 TABLETS (40-100MG ) BY MOUTH 30 MINUTES PRIOR TO INTERCOURSE AS DIRECTED  . traZODone (DESYREL) 50 MG tablet Take 0.5-1 tablets (25-50 mg total) by mouth at bedtime as needed for sleep.   No facility-administered encounter medications on file as of 11/10/2015.    Activities of Daily Living In your present state of health, do you have any difficulty performing the following activities: 11/10/2015 01/03/2015  Hearing? Tempie Donning  Vision? N N  Difficulty concentrating or making decisions? Y N  Walking or climbing stairs? N N  Dressing or bathing? N N  Doing errands, shopping? N Y  Conservation officer, nature and eating ? N -  Using the Toilet? N -  In the past six months,  have you accidently leaked urine? N -  Do you have problems with loss of bowel control? N -  Managing your Medications? N -  Managing your Finances? N -  Housekeeping or managing your Housekeeping? N -    Patient Care Team: Owens Loffler, MD as PCP - General Jodi Marble, MD as Consulting Physician (Otolaryngology)   Provider list updated.  Assessment:     Exercise Activities and Dietary recommendations Current Exercise Habits:: The patient does not participate in regular exercise at present  Goals    Starting 11/10/15, pt will perform relaxation techniques prior to bedtime.      Fall Risk Fall Risk  11/10/2015 06/12/2014  Falls in the past year? No No   Depression Screen PHQ 2/9 Scores 11/10/2015 06/12/2014  PHQ - 2 Score 0 0    Cognitive Testing MMSE - Mini Mental State Exam 11/10/2015  Orientation to time 5  Orientation to Place 5  Registration 3  Attention/ Calculation 5  Recall 3  Language- repeat 1  Language- follow 3 step command 3  Language- read & follow direction 1    Immunization History  Administered Date(s) Administered  . Influenza,inj,Quad PF,36+ Mos 07/29/2015  . Pneumococcal Conjugate-13 12/22/2014  . Pneumococcal Polysaccharide-23 02/25/2013   Screening Tests Health Maintenance  Topic Date Due  . TETANUS/TDAP  Pt will have this vaccine completed at pharmacy  . ZOSTAVAX  Pt will have this vaccine completed at pharmacy.   . INFLUENZA VACCINE  04/12/2016  . PNA vac Low Risk Adult  Completed      Plan:      I have personally reviewed the Medicare Annual Wellness questionnaire and have noted the following in the patient's chart:  A. Medical and social history B. Use of alcohol, tobacco or illicit drugs  C. Current medications and supplements D. Functional ability and status E.  Nutritional status F.  Physical activity G. Advance directives H. List of other physicians I.  Hospitalizations, surgeries, and ER visits in previous 12  months  During the course of the visit the patient was educated and counseled about the following appropriate screening and preventive services :   -tetanus -shingles -general preventive health recommendations  A written personalized care plan for preventive services was provided to patient. No referrals were made but the patient was encouraged to consider discussing a sleep study referral with PCP at next appt due to reports of difficulty staying asleep and unusual sleep behaviors, i.e. physically acting out dreams while asleep.    See scanned questionnaire as needed for further documentation.  Signed,   Lindell Noe, MHA, BS, LPN Health Advisor  [phone]

## 2015-11-26 ENCOUNTER — Other Ambulatory Visit: Payer: Self-pay | Admitting: Cardiovascular Disease

## 2015-12-14 ENCOUNTER — Other Ambulatory Visit: Payer: Medicare Other

## 2015-12-25 ENCOUNTER — Other Ambulatory Visit: Payer: Self-pay | Admitting: Cardiovascular Disease

## 2015-12-28 DIAGNOSIS — H35033 Hypertensive retinopathy, bilateral: Secondary | ICD-10-CM | POA: Diagnosis not present

## 2015-12-28 DIAGNOSIS — H26492 Other secondary cataract, left eye: Secondary | ICD-10-CM | POA: Diagnosis not present

## 2015-12-28 DIAGNOSIS — Z961 Presence of intraocular lens: Secondary | ICD-10-CM | POA: Diagnosis not present

## 2015-12-28 DIAGNOSIS — I10 Essential (primary) hypertension: Secondary | ICD-10-CM | POA: Diagnosis not present

## 2016-01-07 ENCOUNTER — Ambulatory Visit (INDEPENDENT_AMBULATORY_CARE_PROVIDER_SITE_OTHER): Payer: Medicare Other | Admitting: Family Medicine

## 2016-01-07 ENCOUNTER — Encounter: Payer: Self-pay | Admitting: Family Medicine

## 2016-01-07 VITALS — BP 130/70 | HR 50 | Temp 98.0°F | Wt 218.5 lb

## 2016-01-07 DIAGNOSIS — R040 Epistaxis: Secondary | ICD-10-CM

## 2016-01-07 NOTE — Progress Notes (Signed)
BP 130/70 mmHg  Pulse 50  Temp(Src) 98 F (36.7 C) (Oral)  Wt 218 lb 8 oz (99.111 kg)  SpO2 94%   CC: nosebleed  Subjective:    Patient ID: Michael [name], male    DOB: August 06, 1932, 82 y.o.   MRN: [id number]  HPI: Michael [name] is a 82 y.o. male presenting on 01/07/2016 for Epistaxis   Yesterday had spontaneous nosebleed from right nostril after he woke up. Lasted 5 minutes. Stopped on its own - did not pinch nose or use tissue to stop bleed. Mild cough present, productive for last 3 weeks.   No fevers, headache.  Did not check BP.  Does not have frequent nosebleeds.  Only taking aspirin 81mg  daily.   No h/o allergies.  Relevant past medical, surgical, family and social history reviewed and updated as indicated. Interim medical history since our last visit reviewed. Allergies and medications reviewed and updated. Current Outpatient Prescriptions on File Prior to Visit  Medication Sig  . diltiazem (CARDIZEM CD) 120 MG 24 hr capsule TAKE ONE CAPSULE BY MOUTH DAILY  . flecainide (TAMBOCOR) 100 MG tablet Take 1 tablet (100 mg total) by mouth 2 (two) times daily.  Marland Kitchen lovastatin (MEVACOR) 20 MG tablet TAKE ONE TABLET BY MOUTH ONCE DAILY  . metoprolol tartrate (LOPRESSOR) 25 MG tablet Take 1 tablet (25 mg total) by mouth 2 (two) times daily.  . metoprolol tartrate (LOPRESSOR) 25 MG tablet TAKE 1 TABLET BY MOUTH TWICE A DAY  . Multiple Vitamins-Minerals (CENTRUM SILVER ADULT 50+ PO) Take 1 tablet by mouth daily.  Marland Kitchen omeprazole (PRILOSEC) 20 MG capsule TAKE 1 CAPSULE BY MOUTH EVERY DAY AS NEEDED.  Marland Kitchen oxyCODONE-acetaminophen (PERCOCET) 10-325 MG per tablet Take 1 tablet by mouth every 6 (six) hours as needed for pain (pain).   . sildenafil (REVATIO) 20 MG tablet TAKE 2 TO 5 TABLETS (40-100MG ) BY MOUTH 30 MINUTES PRIOR TO INTERCOURSE AS DIRECTED  . traZODone (DESYREL) 50 MG tablet Take 0.5-1 tablets (25-50 mg total) by mouth at bedtime as needed for sleep.   No current  facility-administered medications on file prior to visit.    Review of Systems Per HPI unless specifically indicated in ROS section     Objective:    BP 130/70 mmHg  Pulse 50  Temp(Src) 98 F (36.7 C) (Oral)  Wt 218 lb 8 oz (99.111 kg)  SpO2 94%  Wt Readings from Last 3 Encounters:  01/07/16 218 lb 8 oz (99.111 kg)  11/10/15 221 lb 4 oz (100.358 kg)  08/25/15 223 lb 12.8 oz (101.515 kg)    Physical Exam  Constitutional: He appears well-developed and well-nourished. No distress.  HENT:  Head: Normocephalic and atraumatic.  Right Ear: Decreased hearing is noted.  Left Ear: Decreased hearing is noted.  Nose: Nose normal. No mucosal edema or rhinorrhea. Right sinus exhibits no maxillary sinus tenderness and no frontal sinus tenderness. Left sinus exhibits no maxillary sinus tenderness and no frontal sinus tenderness.  Mouth/Throat: Uvula is midline, oropharynx is clear and moist and mucous membranes are normal. No oropharyngeal exudate, posterior oropharyngeal edema, posterior oropharyngeal erythema or tonsillar abscesses.  Hearing aides in place No nasal lesions identified today Dry nares bilaterally Mild PNDrainage  Eyes: Conjunctivae and EOM are normal. Pupils are equal, round, and reactive to light. No scleral icterus.  Neck: Normal range of motion. Neck supple.  Cardiovascular: Normal rate, regular rhythm, normal heart sounds and intact distal pulses.   No murmur heard. Pulmonary/Chest: Effort normal and  breath sounds normal. No respiratory distress. He has no wheezes. He has no rales.  Lymphadenopathy:    He has no cervical adenopathy.  Skin: Skin is warm and dry. No rash noted.  Nursing note and vitals reviewed.     Assessment & Plan:   Problem List Items Addressed This Visit    Nosebleed - Primary    Right sided, self limited, resolved on its own.  No lesions noted today. rec vaseline to nose or nasal saline.  Red flags to return discussed.  No signs of  bronchitis or PNA.           Follow up plan: No Follow-up on file.  Ria Bush, MD

## 2016-01-07 NOTE — Patient Instructions (Signed)
Possible allergic component with pollen in the air. Consider claritin or allegra for next few days (or zyrtec). Use vaseline to right nostril for next few days.

## 2016-01-07 NOTE — Progress Notes (Signed)
Pre visit review using our clinic review tool, if applicable. No additional management support is needed unless otherwise documented below in the visit note. 

## 2016-01-07 NOTE — Assessment & Plan Note (Addendum)
Right sided, self limited, resolved on its own.  No lesions noted today. rec vaseline to nose or nasal saline.  Red flags to return discussed.  No signs of bronchitis or PNA.

## 2016-01-12 ENCOUNTER — Ambulatory Visit (INDEPENDENT_AMBULATORY_CARE_PROVIDER_SITE_OTHER): Payer: Medicare Other | Admitting: Primary Care

## 2016-01-12 ENCOUNTER — Encounter: Payer: Self-pay | Admitting: Primary Care

## 2016-01-12 VITALS — BP 132/72 | HR 52 | Temp 97.2°F | Ht 70.5 in | Wt 220.0 lb

## 2016-01-12 DIAGNOSIS — R040 Epistaxis: Secondary | ICD-10-CM | POA: Diagnosis not present

## 2016-01-12 NOTE — Progress Notes (Signed)
Subjective:    Patient ID: TERRIO TURIN, male    DOB: 03/15/1932, 82 y.o.   MRN: [id number]  HPI  Mr. Sum is an 82 year old male who presents today with a chief complaint of Epistaxis. He was evaluated by PCP on 04/27 for spontaneous nosebleed from right nostril after waking on 04/26. This particular nosebleed resolved shortly on its own.  His exam was unremarkable and was encouraged to apply Vaseline or use saline nasal spray to help moisten cavity.  Since his last visit he's had several minor nosebleeds. His recent nosebleed began this morning at 8:30 am this morning upon waking. This nosebleed lasted for about 2.5 hours and dissipated 15 minutes prior to his appointment today. His nosebleed was moderate in intensity and did not improve after pinching his nose shut. He is currently managed on Aspirin 81 mg. He's never had a problem with nosebleeds until just recently. Denies weakness, fatigue, confusion, syncope.  Review of Systems  Constitutional: Negative for fatigue.  HENT: Positive for nosebleeds.   Respiratory: Negative for shortness of breath.   Skin: Negative for color change.  Neurological: Negative for dizziness and light-headedness.       Past Medical History  Diagnosis Date  . Colon cancer [facility]) 1988    Surgery alone, no history of chemo  . Ventral hernia 1988  . HLD (hyperlipidemia)   . GERD (gastroesophageal reflux disease)   . Rotator cuff rupture     Right; s/p repair  . BPH (benign prostatic hypertrophy)   . Abnormality of gait   . Unspecified hearing loss   . Impotence of organic origin   . HTN (hypertension)   . Renal insufficiency     stones  . Arthritis   . Chronic back pain   . 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 HOH (hard of hearing)     bilateral hearing aids  . SVT (supraventricular tachycardia) ([place])   . GERD (gastroesophageal reflux disease)   . Dysphagia   . H/O calcium pyrophosphate deposition disease (CPPD)   . Hiatal hernia  01/06/2015  . Diverticulosis   . Aspiration pneumonia [facility])      Social History   Social History  . Marital Status: Married    Spouse Name: Festus Holts  . Number of Children: 3  . Years of Education: college   Occupational History  . Retired    Social History Main Topics  . Smoking status: Never Smoker   . Smokeless tobacco: Never Used  . Alcohol Use: 1.2 oz/week    2 Glasses of wine, 0 Standard drinks or equivalent per week     Comment: Wine (1/2-1 glass 2x/week)  . Drug Use: No  . Sexual Activity: Yes   Other Topics Concern  . Not on file   Social History Narrative   Married.  Independent with ADLs/ambulation.         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 Hemicoloectomy w/ anastomosis  [place]  . Umbilical hernia repair  [facility], x 4  .  Rotator cuff repair Right      [facility]  . Vein ligation and stripping Right   . Eye surgery Bilateral     cataract extraction with IOL bilaterally  . Inguinal hernia repair Right 1963  . Rotator cuff repair Right   . Total knee arthroplasty  03/05/2012    Procedure: TOTAL KNEE ARTHROPLASTY;  Surgeon: Gearlean Alf, MD;  Location[facility];  Service: Orthopedics;  Laterality: Left;  Marland Kitchen Mastectomy, partial Left 02/07/2013    Procedure: LEFT SUBCUTANEOUS MASTECTOMY;  Surgeon: Imogene Burn. Georgette Dover, MD;  Location: [facility];  Service: General;  Laterality: Left;    Family History  Problem Relation Age of Onset  . Osteoarthritis Father   . Congestive Heart Failure Mother     No Known Allergies  Current Outpatient Prescriptions on File Prior to Visit  Medication Sig Dispense Refill  . aspirin (ASPIRIN EC) 81 MG EC tablet Take 81 mg by mouth  daily. Swallow whole.    . diltiazem (CARDIZEM CD) 120 MG 24 hr capsule TAKE ONE CAPSULE BY MOUTH DAILY 30 capsule 11  . flecainide (TAMBOCOR) 100 MG tablet Take 1 tablet (100 mg total) by mouth 2 (two) times daily. 180 tablet 3  . lovastatin (MEVACOR) 20 MG tablet TAKE ONE TABLET BY MOUTH ONCE DAILY 90 tablet 0  . metoprolol tartrate (LOPRESSOR) 25 MG tablet Take 1 tablet (25 mg total) by mouth 2 (two) times daily. 180 tablet 3  . metoprolol tartrate (LOPRESSOR) 25 MG tablet TAKE 1 TABLET BY MOUTH TWICE A DAY 60 tablet 3  . Multiple Vitamins-Minerals (CENTRUM SILVER ADULT 50+ PO) Take 1 tablet by mouth daily.    Marland Kitchen omeprazole (PRILOSEC) 20 MG capsule TAKE 1 CAPSULE BY MOUTH EVERY DAY AS NEEDED. 90 capsule 3  . oxyCODONE-acetaminophen (PERCOCET) 10-325 MG per tablet Take 1 tablet by mouth every 6 (six) hours as needed for pain (pain).     . sildenafil (REVATIO) 20 MG tablet TAKE 2 TO 5 TABLETS (40-100MG ) BY MOUTH 30 MINUTES PRIOR TO INTERCOURSE AS DIRECTED 50 tablet 2  . traZODone (DESYREL) 50 MG tablet Take 0.5-1 tablets (25-50 mg total) by mouth at bedtime as needed for sleep. 30 tablet 5  . vitamin B-12 (CYANOCOBALAMIN) 1000 MCG tablet Take 1,000 mcg by mouth daily.    . vitamin C (ASCORBIC ACID) 500 MG tablet Take 500 mg by mouth daily.     No current facility-administered medications on file prior to visit.    BP 132/72 mmHg  Pulse 52  Temp(Src) 97.2 F (36.2 C) (Oral)  Ht 5' 10.5" (1.791 m)  Wt 220 lb (99.791 kg)  BMI 31.11 kg/m2  SpO2 97%    Objective:   Physical Exam  Constitutional: He appears well-nourished.  HENT:  Nose: No nose lacerations or sinus tenderness. No epistaxis.  Mouth/Throat: Oropharynx is clear and moist.  No active bleeding, lesions, or abnormality to nasal cavity.  Cardiovascular: Normal rate and regular rhythm.   Pulmonary/Chest: Effort normal and breath sounds normal.  Skin: Skin is warm and dry.          Assessment & Plan:

## 2016-01-12 NOTE — Patient Instructions (Signed)
Stop by the front desk and speak with either Michael [name] or Michael [name] regarding your referral to Ear, Nose, Throat for the nosebleeds.  Please go to the emergency department if you're unable to stop a large nose bleed within 1 hour.   Please notify me if you develop increased swelling, redness, or discomfort to your arm.  It was a pleasure meeting you!  Nosebleed Nosebleeds are common. They are due to a crack in the inside lining of your nose (mucous membrane) or from a small blood vessel that starts to bleed. Nosebleeds can be caused by many conditions, such as injury, infections, dry mucous membranes or dry climate, medicines, nose picking, and home heating and cooling systems. Most nosebleeds come from blood vessels in the front of your nose. HOME CARE INSTRUCTIONS   Try controlling your nosebleed by pinching your nostrils gently and continuously for at least 10 minutes.  Avoid blowing or sniffing your nose for a number of hours after having a nosebleed.  Do not put gauze inside your nose yourself. If your nose was packed by your health care provider, try to maintain the pack inside of your nose until your health care provider removes it.  If a gauze pack was used and it starts to fall out, gently replace it or cut off the end of it.  If a balloon catheter was used to pack your nose, do not cut or remove it unless your health care provider has instructed you to do that.  Avoid lying down while you are having a nosebleed. Sit up and lean forward.  Use a nasal spray decongestant to help with a nosebleed as directed by your health care provider.  Do not use petroleum jelly or mineral oil in your nose. These can drip into your lungs.  Maintain humidity in your home by using less air conditioning or by using a humidifier.  Aspirinand blood thinners make bleeding more likely. If you are prescribed these medicines and you suffer from nosebleeds, ask your health care provider if you should stop  taking the medicines or adjust the dose. Do not stop medicines unless directed by your health care provider  Resume your normal activities as you are able, but avoid straining, lifting, or bending at the waist for several days.  If your nosebleed was caused by dry mucous membranes, use over-the-counter saline nasal spray or gel. This will keep the mucous membranes moist and allow them to heal. If you must use a lubricant, choose the water-soluble variety. Use it only sparingly, and do not use it within several hours of lying down.  Keep all follow-up visits as directed by your health care provider. This is important. SEEK MEDICAL CARE IF:  You have a fever.  You get frequent nosebleeds.  You are getting nosebleeds more often. SEEK IMMEDIATE MEDICAL CARE IF:  Your nosebleed lasts longer than 20 minutes.  Your nosebleed occurs after an injury to your face, and your nose looks crooked or broken.  You have unusual bleeding from other parts of your body.  You have unusual bruising on other parts of your body.  You feel light-headed or you faint.  You become sweaty.  You vomit blood.  Your nosebleed occurs after a head injury.   This information is not intended to replace advice given to you by your health care provider. Make sure you discuss any questions you have with your health care provider.   Document Released: 06/08/2005 Document Revised: 09/19/2014 Document Reviewed: 04/14/2014 Elsevier Interactive Patient  Education 2016 [employer].

## 2016-01-12 NOTE — Progress Notes (Signed)
Pre visit review using our clinic review tool, if applicable. No additional management support is needed unless otherwise documented below in the visit note. 

## 2016-01-12 NOTE — Assessment & Plan Note (Signed)
Present intermittently since 04/26. Several minor nosebleeds since office visit on 04/27. Nosebleed today worse than prior bleeds.  Exam today without lesions or obvious cause for bleeds, he is not bleeding at present. He appears well. Vitals stable.  Given reoccurrence and moderate/severe bleed this morning, will send to ENT for further evaluation.  Discussed techniques to minimize bleeding during acute events. Discussed ED precautions in the event that the nosebleeds do not dissipate.

## 2016-01-12 NOTE — Progress Notes (Signed)
Case has been reviewed, all notes and documentation reviewed, and I agree with Ms. Lajean Saver plan of care.   Signed,  Maud Deed. Milbern Doescher, MD

## 2016-01-13 ENCOUNTER — Other Ambulatory Visit: Payer: Self-pay | Admitting: Family Medicine

## 2016-01-13 DIAGNOSIS — R040 Epistaxis: Secondary | ICD-10-CM | POA: Diagnosis not present

## 2016-01-13 NOTE — Telephone Encounter (Signed)
Last office visit 01/12/2016 with Allie Bossier.  Last refilled 08/05/2014 for #30 with 5 refills . Ok to refill?

## 2016-02-09 ENCOUNTER — Other Ambulatory Visit: Payer: Self-pay | Admitting: Family Medicine

## 2016-02-09 NOTE — Telephone Encounter (Signed)
Last office visit 01/12/2016 with Gentry Fitz for nosebleed.  Last Lipid 01/11/2014.  Refill?

## 2016-03-30 DIAGNOSIS — M4806 Spinal stenosis, lumbar region: Secondary | ICD-10-CM | POA: Diagnosis not present

## 2016-04-14 ENCOUNTER — Other Ambulatory Visit: Payer: Self-pay | Admitting: Family Medicine

## 2016-04-14 NOTE — Telephone Encounter (Signed)
Last office visit 01/12/2016 with Gentry Fitz.  Not on current medication list. Refill?

## 2016-04-19 ENCOUNTER — Telehealth: Payer: Self-pay | Admitting: Cardiovascular Disease

## 2016-04-19 MED ORDER — METOPROLOL TARTRATE 25 MG PO TABS: 25.0000 mg | ORAL_TABLET | Freq: Two times a day (BID) | ORAL | 3 refills | Status: DC

## 2016-04-19 NOTE — Telephone Encounter (Signed)
°*  STAT* If patient is at the pharmacy, call can be transferred to refill team.   1. Which medications need to be refilled? (please list name of each medication and dose if known) metoprolol   2. Which pharmacy/location (including street and city if local pharmacy) is medication to be sent to? [place]   3. Do they need a 30 day or 90 day supply? 90 day   Pt is coming 05/12/16 to see Ignacia Bayley  He is going out of town for 2 weeks does not have enough to last him his trip  Please advise.

## 2016-05-12 ENCOUNTER — Ambulatory Visit (INDEPENDENT_AMBULATORY_CARE_PROVIDER_SITE_OTHER): Payer: Medicare Other | Admitting: Nurse Practitioner

## 2016-05-12 ENCOUNTER — Encounter: Payer: Self-pay | Admitting: Nurse Practitioner

## 2016-05-12 ENCOUNTER — Ambulatory Visit (INDEPENDENT_AMBULATORY_CARE_PROVIDER_SITE_OTHER)
Admission: RE | Admit: 2016-05-12 | Discharge: 2016-05-12 | Disposition: A | Discharge status: Home / Self Care | Payer: Medicare Other | Source: Ambulatory Visit | Attending: Nurse Practitioner | Admitting: Nurse Practitioner

## 2016-05-12 VITALS — BP 130/78 | HR 55 | Ht 70.0 in | Wt 219.2 lb

## 2016-05-12 DIAGNOSIS — R042 Hemoptysis: Secondary | ICD-10-CM

## 2016-05-12 DIAGNOSIS — I471 Supraventricular tachycardia: Secondary | ICD-10-CM

## 2016-05-12 DIAGNOSIS — I119 Hypertensive heart disease without heart failure: Secondary | ICD-10-CM | POA: Insufficient documentation

## 2016-05-12 DIAGNOSIS — E785 Hyperlipidemia, unspecified: Secondary | ICD-10-CM | POA: Diagnosis not present

## 2016-05-12 DIAGNOSIS — I110 Hypertensive heart disease with heart failure: Secondary | ICD-10-CM

## 2016-05-12 DIAGNOSIS — K219 Gastro-esophageal reflux disease without esophagitis: Secondary | ICD-10-CM

## 2016-05-12 NOTE — Patient Instructions (Signed)
Medication Instructions:  Your physician recommends that you continue on your current medications as directed. Please refer to the Current Medication list given to you today.   Labwork: none  Testing/Procedures: A chest x-ray takes a picture of the organs and structures inside the chest, including the heart, lungs, and blood vessels. This test can show several things, including, whether the heart is enlarges; whether fluid is building up in the lungs; and whether pacemaker / defibrillator leads are still in place.   Follow-Up: Your physician wants you to follow-up in: 6 months with Dr. Rockey Situ. You will receive a reminder letter in the mail two months in advance. If you don't receive a letter, please call our office to schedule the follow-up appointment.   Any Other Special Instructions Will Be Listed Below (If Applicable).     If you need a refill on your cardiac medications before your next appointment, please call your pharmacy.

## 2016-05-12 NOTE — Progress Notes (Signed)
Office Visit    Patient Name: Michael [name] Date of Encounter: 05/12/2016  Primary Care Provider:  Owens Loffler, MD Primary Cardiologist:  Johnny Bridge, MD   Chief Complaint    82 y/o ? with a h/o PSVT, HTN, and HL, who presents for f/u.  Past Medical History    Past Medical History:  Diagnosis Date  . Abnormality of gait   . Arthritis   . Aspiration pneumonia ([place])   . BPH (benign prostatic hypertrophy)   . Chronic back pain   . CKD (chronic kidney disease), stage III   . Colon cancer [facility]) 1988   Surgery alone, no history of chemo  . Diverticulosis   . Dysphagia   . Epistaxis   . Essential hypertension   . GERD (gastroesophageal reflux disease)   . H/O calcium pyrophosphate deposition disease (CPPD)   . Hiatal hernia 01/06/2015  . HLD (hyperlipidemia)   . HOH (hard of hearing)    bilateral hearing aids  . Impotence of organic origin   . PSVT (paroxysmal supraventricular tachycardia) ([place])    a. 04/2014 Event Monitor: Freq bouts of SVT with rates to 180-->seen by EP with recommendation for RFCA, pt deferred;  b. 05/2014 Echo: EF nl, mild conc LVH, no rwma, Gr1 DD, mildMR/TR.  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 Rotator cuff rupture    Right; s/p repair  . Unspecified hearing loss   . Ventral hernia 1988   Past Surgical History:  Procedure Laterality Date  . EYE SURGERY Bilateral    cataract extraction with IOL bilaterally  . [place]  . INGUINAL HERNIA REPAIR Right 1963  . MASTECTOMY, PARTIAL Left 02/07/2013   Procedure: LEFT SUBCUTANEOUS MASTECTOMY;  Surgeon: Imogene Burn. Georgette Dover, MD;  Location: [facility];  Service: General;  Laterality: Left;  . ROTATOR CUFF REPAIR Right     [facility]  . ROTATOR CUFF REPAIR Right   . TOTAL KNEE ARTHROPLASTY  03/05/2012   Procedure: TOTAL KNEE ARTHROPLASTY;  Surgeon: Gearlean Alf, MD;  Location: [facility];  Service: Orthopedics;  Laterality: Left;  . [place] [facility], x 4  . VEIN LIGATION AND STRIPPING Right     Allergies  No Known Allergies  History of Present Illness    82 y/o ? with a h/o PSVT, HTN, and HL.  He previously had frequent bouts of SVT, documented on event monitoring in 04/2014.  He was eval by EP @ that time with plan to pursue RFCA, but pt ultimately decided to defer and subsequently did pretty well. He was last seen by Dr. Rockey Situ in 08/2015, @ which time he was doing well.  He remains on flecainide,  blocker, and dilt.  Since his last visit in December, he has continued to do well.  He has gained some weight over the past year and he is disappointed by this.  He knows that he needs to increase his activity and watch what he is eating.  He has not been having any palpitations and He denies chest pain, pnd, orthopnea, n, v, dizziness, syncope, edema, weight gain, or early satiety. Over the past three days, he has had an intermittent productive cough with greenish sputum and a small amount of blood.  He has had hemoptysis in the past in the setting of pna.  He has not had any fevers/chills/malaise.  He also reports a recent increase in  GERD Ss - especially after eating acidic foods or lying down.  He has not been taking his prilosec.  Home Medications    Prior to Admission medications   Medication Sig Start Date End Date Taking? Authorizing Provider  aspirin (ASPIRIN EC) 81 MG EC tablet Take 81 mg by mouth daily. Swallow whole.    Historical Provider, MD  diltiazem (CARDIZEM CD) 120 MG 24 hr capsule TAKE ONE CAPSULE BY MOUTH DAILY 11/26/15   Minna Merritts, MD  flecainide (TAMBOCOR) 100 MG tablet Take 1 tablet (100 mg total) by mouth 2 (two) times daily. 10/08/15   Minna Merritts, MD  lovastatin (MEVACOR) 20 MG tablet TAKE ONE TABLET BY MOUTH ONCE DAILY 02/10/16   Owens Loffler, MD  metoprolol tartrate (LOPRESSOR) 25 MG tablet Take 1 tablet (25 mg total) by mouth 2 (two) times daily. 04/19/16    Minna Merritts, MD  Multiple Vitamins-Minerals (CENTRUM SILVER ADULT 50+ PO) Take 1 tablet by mouth daily.    Historical Provider, MD  omeprazole (PRILOSEC) 20 MG capsule TAKE 1 CAPSULE BY MOUTH EVERY DAY AS NEEDED. 12/15/14   Owens Loffler, MD  oxyCODONE-acetaminophen (PERCOCET) 10-325 MG per tablet Take 1 tablet by mouth every 6 (six) hours as needed for pain (pain).  10/10/14   Historical Provider, MD  sildenafil (REVATIO) 20 MG tablet TAKE 2 TO 5 TABLETS (40-100MG ) BY MOUTH 30 MINUTES PRIOR TO INTERCOURSE AS DIRECTED 10/21/15   Owens Loffler, MD  traZODone (DESYREL) 50 MG tablet TAKE 1/2 TO 1 TABLET BY MOUTH AT [facility] NEEDED FOR SLEEP 01/13/16   Owens Loffler, MD  triamcinolone cream (KENALOG) 0.1 % APPLY TO RASH TWICE A DAY FOR 10 DAYS, THEN DAILY FOR 10 DAYS 04/17/16   Owens Loffler, MD  vitamin B-12 (CYANOCOBALAMIN) 1000 MCG tablet Take 1,000 mcg by mouth daily.    Historical Provider, MD  vitamin C (ASCORBIC ACID) 500 MG tablet Take 500 mg by mouth daily.    Historical Provider, MD    Review of Systems    Productive cough with greenish sputum and trace amts of blood x 3 days.  He denies chest pain, palpitations, dyspnea, pnd, orthopnea, n, v, dizziness, syncope, edema, weight gain, or early satiety.  All other systems reviewed and are otherwise negative except as noted above.  Physical Exam    VS:  BP 130/78 (BP Location: Left Arm, Patient Position: Sitting, Cuff Size: Normal)   Pulse (!) 55   Ht 5\' 10"  (1.778 m)   Wt 219 lb 4 oz (99.5 kg)   BMI 31.46 kg/m  , BMI Body mass index is 31.46 kg/m. GEN: Well nourished, well developed, in no acute distress.  HEENT: normal.  Neck: Supple, no JVD, carotid bruits, or masses. Cardiac: RRR, no murmurs, rubs, or gallops. No clubbing, cyanosis, edema.  Radials/DP/PT 2+ and equal bilaterally.  Respiratory:  Respirations regular and unlabored, clear to auscultation bilaterally. GI: Soft, nontender, nondistended, BS + x 4. MS: no deformity  or atrophy. Skin: warm and dry, no rash. Neuro:  Strength and sensation are intact. Psych: Normal affect.  Accessory Clinical Findings    ECG - Sinus Brady, 56, 1st deg AVB, no acute st/t changes.  Assessment & Plan    1.  PSVT:  He continues to do well on medical therapy (flecainide,  blocker, and dilt) without recurrence of SVT/palpitations.  ECG stable today.  No changes.  2.  Essential HTN:  Stable on  blocker and dilt.  3.  HL: On lovastatin.  Lipids followed by PCP.  4.  Hemoptysis:  Over the past three days, he has been having intermittent productive cough with a small amt of blood in his sputum.  He has not had any malaise, fever, or chills.  He says that only prior episodes of hemoptysis occurred in the setting of PNA.  His lungs are clear and he is afebrile.  I've asked him to hold his ASA and will arrange for a cxr today.  He prefers to have this done @ his PCP's office - order written.  5.  GERD:  Increased GERD Ss recently - esp after acidic foods or while lying in bed @ night.  He has prilosec @ home but does not routinely take it.  I advised that he should take it daily and he should also try to avoid foods that aggravate this condition.  6.  Dispo:  F/u cxr today.  F/u Dr. Rockey Situ in 6 mos or sooner if necessary.  Murray Hodgkins, NP 05/12/2016, 2:55 PM

## 2016-05-22 ENCOUNTER — Telehealth: Payer: Self-pay | Admitting: Family Medicine

## 2016-05-23 ENCOUNTER — Telehealth: Payer: Self-pay

## 2016-05-23 ENCOUNTER — Ambulatory Visit (INDEPENDENT_AMBULATORY_CARE_PROVIDER_SITE_OTHER): Payer: Medicare Other | Admitting: Family Medicine

## 2016-05-23 ENCOUNTER — Encounter: Payer: Self-pay | Admitting: Family Medicine

## 2016-05-23 ENCOUNTER — Ambulatory Visit (INDEPENDENT_AMBULATORY_CARE_PROVIDER_SITE_OTHER)
Admission: RE | Admit: 2016-05-23 | Discharge: 2016-05-23 | Disposition: A | Discharge status: Home / Self Care | Payer: Medicare Other | Source: Ambulatory Visit | Attending: Family Medicine | Admitting: Family Medicine

## 2016-05-23 ENCOUNTER — Telehealth: Payer: Self-pay | Admitting: *Deleted

## 2016-05-23 VITALS — BP 110/60 | HR 64 | Temp 98.4°F | Ht 70.0 in | Wt 215.8 lb

## 2016-05-23 DIAGNOSIS — J690 Pneumonitis due to inhalation of food and vomit: Secondary | ICD-10-CM

## 2016-05-23 DIAGNOSIS — R042 Hemoptysis: Secondary | ICD-10-CM

## 2016-05-23 DIAGNOSIS — N183 Chronic kidney disease, stage 3 unspecified: Secondary | ICD-10-CM

## 2016-05-23 DIAGNOSIS — J189 Pneumonia, unspecified organism: Secondary | ICD-10-CM | POA: Diagnosis not present

## 2016-05-23 DIAGNOSIS — I2510 Atherosclerotic heart disease of native coronary artery without angina pectoris: Secondary | ICD-10-CM

## 2016-05-23 HISTORY — DX: Atherosclerotic heart disease of native coronary artery without angina pectoris: I25.10

## 2016-05-23 MED ORDER — LEVOFLOXACIN 500 MG PO TABS: 500.0000 mg | ORAL_TABLET | Freq: Every day | ORAL | 0 refills | Status: DC

## 2016-05-23 NOTE — Patient Instructions (Signed)

## 2016-05-23 NOTE — Telephone Encounter (Signed)
Michael [name] notified as instructed by telephone.  Levaquin Rx sent to [facility] as instructed by Dr. Lorelei Pont.  Michael [name] states he does not have a GI doctor and request referral to someone Dr. Lorelei Pont would recommend.  Will forward note back to Dr. Lorelei Pont for GI referral.

## 2016-05-23 NOTE — Telephone Encounter (Signed)
-----   Message from Owens Loffler, MD sent at 05/23/2016  4:31 PM EDT ----- Call  CT and xrays consistent most likely with pneumonia - not seen on xray 2 weeks ago.  No masses or clear cancers are seen.  Start Levaquin 500 mg, 1 po daily x 10 days. #10.  I would also like for him to follow-up with GI, too, given hiatal hernia and prior similar issues.   We will need to get a follow-up chest xray in 1 month

## 2016-05-23 NOTE — Progress Notes (Signed)
Pre visit review using our clinic review tool, if applicable. No additional management support is needed unless otherwise documented below in the visit note. 

## 2016-05-23 NOTE — Progress Notes (Signed)
Dr. Karleen Hampshire T. Ramonda Galyon, MD, CAQ Sports Medicine [facility] [address] [place], [address] Phone: [phone] Fax: [phone]  05/23/2016  Patient: Michael [name], MRN: [id number], DOB: [birth year], 82 y.o.  Primary Physician:  [name], MD   Chief Complaint  Patient presents with  . coughing up blood   Subjective:   Michael [name] is a 82 y.o. very pleasant male patient who presents with the following:  Spitting up blood almost continuously - up quite often. Yesterday had a temperature to 100 F.  A couple of days ago had some nausea.   Woke up in a puddle of sweat and sinc ethen has been doing better.  Blood tinged mucous. Will stay that way.   Taking 1 baby aspirin a day.   94% Off and on and not consistently coughing up blood Spicy food.   Body mass index is 30.96 kg/m.   Past Medical History, Surgical History, Social History, Family History, Problem List, Medications, and Allergies have been reviewed and updated if relevant.  Patient Active Problem List   Diagnosis Date Noted  . HLD (hyperlipidemia)   . Hypertensive heart disease   . PSVT (paroxysmal supraventricular tachycardia) (H[facility])   . Nosebleed 01/07/2016  . Acute bronchitis 08/04/2015  . Hiatal hernia 01/06/2015  . Aspiration pneumonia (H[facility]) 01/03/2015  . Dysphasia 01/03/2015  . Hemoptysis 01/03/2015  . SVT (supraventricular tachycardia) (H[facility]) 04/18/2014  . 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 Impotence of organic origin   . Orthostatic hypotension 05/10/2012  . OA (osteoarthritis) of knee 03/05/2012  . Essential hypertension 01/08/2010  . BENIGN PROSTATIC HYPERTROPHY, WITH URINARY OBSTRUCTION 01/07/2010  . HEARING LOSS, UNSPEC. 12/08/2008  . GAIT DISTURBANCE 12/08/2008  . Hyperlipidemia LDL goal <100 07/23/2008  . GERD 07/23/2008  . CKD (chronic kidney disease) stage 3, GFR 30-59 ml/min 07/23/2008  . Colon cancer (HCC) 07/22/2008    Past Medical  History:  Diagnosis Date  . Abnormality of gait   . Arthritis   . Aspiration pneumonia (H[facility])   . BPH (benign prostatic hypertrophy)   . Chronic back pain   . CKD (chronic kidney disease), stage III   . Colon cancer [facility]) 1988   Surgery alone, no history of chemo  . Diverticulosis   . Dysphagia   . Epistaxis   . Essential hypertension   . GERD (gastroesophageal reflux disease)   . H/O calcium pyrophosphate deposition disease (CPPD)   . Hiatal hernia 01/06/2015  . HLD (hyperlipidemia)   . HOH (hard of hearing)    bilateral hearing aids  . Impotence of organic origin   . PSVT (paroxysmal supraventricular tachycardia) (HCC)    a. 04/2014 Event Monitor: Freq bouts of SVT with rates to 180-->seen by EP with recommendation for RFCA, pt deferred;  b. 05/2014 Echo: EF nl, mild conc LVH, no rwma, Gr1 DD, mildMR/TR.  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 Rotator cuff rupture    Right; s/p repair  . Unspecified hearing loss   . Ventral hernia 1988    Past Surgical History:  Procedure Laterality Date  . EYE SURGERY Bilateral    cataract extraction with IOL bilaterally  . HEMICOLOECTOMY W/ ANASTOMOSIS  1988   [name]  . INGUINAL HERNIA REPAIR Right 1963  . MASTECTOMY, PARTIAL Left 02/07/2013   Procedure: LEFT SUBCUTANEOUS MASTECTOMY;  Surgeon: Wilmon Arms. Corliss Skains, MD;  Location: [facility];  Service: General;  Laterality: Left;  . ROTATOR CUFF REPAIR  Right     [facility]  . ROTATOR CUFF REPAIR Right   . TOTAL KNEE ARTHROPLASTY  03/05/2012   Procedure: TOTAL KNEE ARTHROPLASTY;  Surgeon: Loanne Drilling, MD;  Location: [facility];  Service: Orthopedics;  Laterality: Left;  . UMBILICAL HERNIA REPAIR  [facility], x 4  . VEIN LIGATION AND STRIPPING Right     Social History   Social History  . Marital status: Married    Spouse name: [name]  . Number of children: 3  . Years of education: college   Occupational History  . Retired Retired   Social  History Main Topics  . Smoking status: Never Smoker  . Smokeless tobacco: Never Used  . Alcohol use 1.2 oz/week    2 Glasses of wine per week     Comment: Wine (1/2-1 glass 2x/week)  . Drug use: No  . Sexual activity: Yes   Other Topics Concern  . Not on file   Social History Narrative   Married.  Independent with ADLs/ambulation.         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 Osteoarthritis Father   . Congestive Heart Failure Mother     No Known Allergies  Medication list reviewed and updated in full in [place] [facility].  ROS: GEN: Acute illness details above GI: Tolerating PO intake GU: maintaining adequate hydration and urination Pulm: No SOB Interactive and getting along well at home.  Otherwise, ROS is as per the HPI.  Objective:   BP 110/60   Pulse 64   Temp 98.4 F (36.9 C) (Oral)   Ht 5\' 10"  (1.778 m)   Wt 215 lb 12 oz (97.9 kg)   SpO2 92%   BMI 30.96 kg/m    GEN: WDWN, NAD, Non-toxic, A & O x 3 HEENT: Atraumatic, Normocephalic. Neck supple. No masses, No LAD. Ears and Nose: No external deformity. CV: RRR, No M/G/R. No JVD. No thrill. No extra heart sounds. PULM: CTA B, no wheezes, LLL with crackles. No retractions. No resp. distress. No accessory muscle use. EXTR: No c/c/e NEURO Normal gait.  PSYCH: Normally interactive. Conversant. Not depressed or anxious appearing.  Calm demeanor.     Laboratory and Imaging Data: Dg Chest 2 View  Result Date: 05/23/2016 CLINICAL DATA:  Hemoptysis, history of left breast malignancy, hypertension, colonic malignancy. , nonsmoker. EXAM: CHEST  2 VIEW COMPARISON:  PA and lateral chest x-ray of May 12, 2016 FINDINGS: There is hazy interstitial infiltrate in the left  perihilar region. The right lung is clear. There is no pleural effusion or pneumothorax. The heart and pulmonary vascularity are normal. There is calcification in the wall of the thoracic aorta. The bony thorax exhibits no acute abnormality. IMPRESSION: Interstitial pneumonia in the right upper and right lower lobes. No alveolar infiltrate. Followup PA and lateral chest X-ray is recommended in 3-4 weeks following trial of antibiotic therapy to ensure resolution and exclude underlying malignancy. Aortic atherosclerosis. Electronically Signed   By: David  [place] M.D.   On: 05/23/2016 12:17   Dg Chest 2 View  Result  Date: 05/12/2016 CLINICAL DATA:  Hemoptysis.  Personal history of colon carcinoma. EXAM: CHEST  2 VIEW COMPARISON:  02/13/2015 FINDINGS: The heart size and mediastinal contours are within normal limits. Aortic atherosclerosis. No evidence of pleural effusion or pneumothorax. Both lungs are clear. Thoracic spine degenerative changes noted. IMPRESSION: No active cardiopulmonary disease. Aortic atherosclerosis. Electronically Signed   By: Myles Rosenthal M.D.   On: 05/12/2016 17:08    Basic Metabolic Panel:    Component Value Date/Time   NA 141 07/29/2015 1523   K 4.9 07/29/2015 1523   CL 101 07/29/2015 1523   CO2 31 07/29/2015 1523   BUN 29 (H) 07/29/2015 1523   CREATININE 1.61 (H) 07/29/2015 1523   GLUCOSE 77 07/29/2015 1523   CALCIUM 9.4 07/29/2015 1523     Assessment and Plan:   Coughing up blood - Plan: DG Chest 2 View, CT Chest Wo Contrast  Aspiration pneumonia of left lower lobe due to gastric secretions (HCC)  CKD (chronic kidney disease) stage 3, GFR 30-59 ml/min  3 different episodes of hemoptysis. Prior history of asp PNA req hosp.  Obtain a CT of the chest to evaluate for potential neoplasm - no contrast secondary to GFR. Cr 1.6 most recently.  Follow-up: No Follow-up on file.  Orders Placed This Encounter  Procedures  . DG Chest 2 View  . CT Chest Wo Contrast     Signed,  Phillip Maffei T. Glendora Clouatre, MD   Patient's Medications  New Prescriptions   No medications on file  Previous Medications   ASPIRIN (ASPIRIN EC) 81 MG EC TABLET    Take 81 mg by mouth daily. Swallow whole.   BIOFLAVONOID PRODUCTS (ESTER C PO)    Take by mouth.   DILTIAZEM (CARDIZEM CD) 120 MG 24 HR CAPSULE    TAKE ONE CAPSULE BY MOUTH DAILY   FLECAINIDE (TAMBOCOR) 100 MG TABLET    Take 1 tablet (100 mg total) by mouth 2 (two) times daily.   LOVASTATIN (MEVACOR) 20 MG TABLET    TAKE ONE TABLET BY MOUTH ONCE DAILY   METOPROLOL TARTRATE (LOPRESSOR) 25 MG TABLET    Take 1 tablet (25 mg total) by mouth 2 (two) times daily.   MULTIPLE VITAMINS-MINERALS (CENTRUM SILVER ADULT 50+ PO)    Take 1 tablet by mouth daily.   OMEPRAZOLE (PRILOSEC) 20 MG CAPSULE    TAKE 1 CAPSULE BY MOUTH EVERY DAY AS NEEDED.   OXYCODONE-ACETAMINOPHEN (PERCOCET) 10-325 MG PER TABLET    Take 1 tablet by mouth every 6 (six) hours as needed for pain (pain).    SILDENAFIL (REVATIO) 20 MG TABLET    TAKE 2 TO 5 TABLETS (40-100MG ) BY MOUTH 30 MINUTES PRIOR TO INTERCOURSE AS DIRECTED   TRAZODONE (DESYREL) 50 MG TABLET    TAKE 1/2 TO 1 TABLET BY MOUTH AT BEDTIMEAS NEEDED FOR SLEEP   TRIAMCINOLONE CREAM (KENALOG) 0.1 %    APPLY TO RASH TWICE A DAY FOR 10 DAYS, THEN DAILY FOR 10 DAYS   VITAMIN B-12 (CYANOCOBALAMIN) 1000 MCG TABLET    Take 1,000 mcg by mouth daily.   VITAMIN C (ASCORBIC ACID) 500 MG TABLET    Take 500 mg by mouth daily.  Modified Medications   No medications on file  Discontinued Medications   No medications on file

## 2016-05-23 NOTE — Telephone Encounter (Signed)
PLEASE NOTE: All timestamps contained within this report are represented as [place] Standard Time. CONFIDENTIALTY NOTICE: This fax transmission is intended only for the addressee. It contains information that is legally privileged, confidential or otherwise protected from use or disclosure. If you are not the intended recipient, you are strictly prohibited from reviewing, disclosing, copying using or disseminating any of this information or taking any action in reliance on or regarding this information. If you have received this fax in error, please notify us immediately by telephone so that we can arrange for its return to [place]. Phone: [phone], Toll-Free: [phone], Fax: [phone] Page: 1 of 2 Call Id: [id number] [place] Patient Name: Michael [name] Gender: Male DOB: 1932/08/20 Age: 82 Y 35 M 26 D Return Phone Number: [phone] (Primary), [phone] (Secondary) Address: [place] [address] Client [facility] Night - Client Client Site [place] Physician Copland, Frederico Hamman - MD Contact Type Call Who Is Calling Patient / Member / Family / Caregiver Call Type Triage / Clinical Relationship To Patient Self Return Phone Number [phone] (Primary) Chief Complaint Coughing Up Blood Reason for Call Symptomatic / Request for Health Information Initial Comment Caller states on Aug 31st was spitting up a little bit of blood, X-ray is negative, went over friend's house ate some spicy food, fever and is coughing up blood. PreDisposition Call Doctor Translation No Nurse Assessment Nurse: Wynetta Emery, RN, Baker Janus Date/Time Eilene Ghazi Time): 05/22/2016 4:36:58 PM Confirm and document reason for call. If symptomatic, describe symptoms. You must click the next button to save text entered. ---Michael [name] had negative chest Xray after  spitting up blood 05-12-2016; Last night he had spicy food and now he is coughing up blood; and came back the neighbor's chilled and fevered and acid reflux. during this time coughed up phelgm with blood in it. did go to sleep and woke chilling -- then this am woke up drenched in sweat; coughing up blood is less -- wheezing last night worried about PNA Has the patient traveled out of the country within the last 30 days? ---No Does the patient have any new or worsening symptoms? ---Yes Will a triage be completed? ---Yes Related visit to physician within the last 2 weeks? ---No Does the PT have any chronic conditions? (i.e. diabetes, asthma, etc.) ---Unknown Is this a behavioral health or substance abuse call? ---No Guidelines Guideline Title Affirmed Question Affirmed Notes Nurse Date/Time (Eastern Time) Coughing Up Blood Coughing up rustycolored sputum Michael [name] 05/22/2016 4:41:15 PM Disp. Time Eilene Ghazi Time) Disposition Final User 05/22/2016 4:44:18 PM See Physician within 24 Hours Yes Wynetta Emery, RN, Baker Janus PLEASE NOTE: All timestamps contained within this report are represented as [place] Standard Time. CONFIDENTIALTY NOTICE: This fax transmission is intended only for the addressee. It contains information that is legally privileged, confidential or otherwise protected from use or disclosure. If you are not the intended recipient, you are strictly prohibited from reviewing, disclosing, copying using or disseminating any of this information or taking any action in reliance on or regarding this information. If you have received this fax in error, please notify us immediately by telephone so that we can arrange for its return to [place]. Phone: [phone], Toll-Free: [phone], Fax: [phone] Page: 2 of 2 Call Id: [id number] Caller Understands: Yes Disagree/Comply: Comply Care Advice Given Per Guideline SEE PHYSICIAN WITHIN 24 HOURS: * OTC COUGH SYRUPS: The most common cough  suppressant in  OTC cough medications is dextromethorphan. Often the letters 'DM' appear in the name. HUMIDIFIER: If the air is dry, use a humidifier in the bedroom. (Reason: dry air makes coughs worse) * You cough up more than a tablespoon of pure red blood * Difficulty breathing * You become worse. CARE ADVICE given per Coughing Up Blood guideline. Comments User: Michael Rockers, RN Date/Time Eilene Ghazi Time): 05/22/2016 4:52:10 PM NOTE : appt made with Dr. Lorelei Pont for [phone] at 1015am for c/o cough up blood rusty colored sputum and wheeze occasional and fever. Referrals REFERRED TO PCP OFFICE

## 2016-05-23 NOTE — Telephone Encounter (Signed)
Pt has appt with Dr Lorelei Pont 05/23/16 at 10:15.

## 2016-08-23 ENCOUNTER — Other Ambulatory Visit: Payer: Self-pay | Admitting: Family Medicine

## 2016-08-23 NOTE — Telephone Encounter (Signed)
Last office visit 05/23/2016.  Last Lipid Panel 06/09/2014.  Refill?

## 2016-08-24 NOTE — Telephone Encounter (Signed)
Ok to refill 90, 0 ref  F/u medicare cpx

## 2016-08-24 NOTE — Telephone Encounter (Signed)
Michael [name] please call and schedule Medicare Wellness for sometime after 11/10/2015 with you and then a CPE with Dr. Lorelei Pont.  Patient needs a Lipid panel at that visit.

## 2016-08-25 ENCOUNTER — Other Ambulatory Visit: Payer: Self-pay | Admitting: Family Medicine

## 2016-10-10 ENCOUNTER — Other Ambulatory Visit: Payer: Self-pay | Admitting: Cardiovascular Disease

## 2016-10-27 ENCOUNTER — Other Ambulatory Visit: Payer: Self-pay | Admitting: Cardiovascular Disease

## 2016-11-04 ENCOUNTER — Ambulatory Visit (INDEPENDENT_AMBULATORY_CARE_PROVIDER_SITE_OTHER): Payer: Medicare Other | Admitting: Cardiovascular Disease

## 2016-11-04 ENCOUNTER — Encounter: Payer: Self-pay | Admitting: Cardiovascular Disease

## 2016-11-04 VITALS — BP 126/62 | HR 49 | Ht 70.0 in | Wt 216.5 lb

## 2016-11-04 DIAGNOSIS — I471 Supraventricular tachycardia: Secondary | ICD-10-CM

## 2016-11-04 DIAGNOSIS — E785 Hyperlipidemia, unspecified: Secondary | ICD-10-CM | POA: Diagnosis not present

## 2016-11-04 DIAGNOSIS — I10 Essential (primary) hypertension: Secondary | ICD-10-CM | POA: Diagnosis not present

## 2016-11-04 DIAGNOSIS — I700 Atherosclerosis of aorta: Secondary | ICD-10-CM | POA: Diagnosis not present

## 2016-11-04 DIAGNOSIS — I2510 Atherosclerotic heart disease of native coronary artery without angina pectoris: Secondary | ICD-10-CM | POA: Diagnosis not present

## 2016-11-04 NOTE — Patient Instructions (Addendum)
Medication Instructions:   Please stop the metoprolol  Labwork:  No new labs needed  Testing/Procedures:  No further testing at this time   I recommend watching educational videos on topics of interest to you at:       [email or web address]  Enter code: [email or web address]    Follow-Up: It was a pleasure seeing you in the office today. Please call us if you have new issues that need to be addressed before your next appt.  [phone]  Your physician wants you to follow-up in: 6 months.  You will receive a reminder letter in the mail two months in advance. If you don't receive a letter, please call our office to schedule the follow-up appointment.  If you need a refill on your cardiac medications before your next appointment, please call your pharmacy.

## 2016-11-04 NOTE — Progress Notes (Signed)
Cardiology Office Note  Date:  11/04/2016   ID:  Michael [name], Michael [name] 11/19/1931, MRN [id number]  PCP:  Owens Loffler, MD   Chief Complaint  Patient presents with  . other    6 month follow up. Patient states he is doing well. Meds reviewed verbally with patient.    HPI:  Michael [name] is a pleasant 83 year old gentleman with history of total knee replacement,  renal dysfunction, Previous episodes of SVT. previously seen by Dr. Rayann Heman and plan was made for SVT ablation. in the end he changed his mind and canceled the procedure He presents today for follow-up of his SVT  Recent CT scan September 2017 for pneumonia reviewed with him in detail Images pulled up in the office He has extensive coronary calcifications, all 3 coronary vessels , at least moderate diffuse aortic atherosclerosis  Recent visit by [place] health nurse told him to stop lovastatin Consider stopping diltiazem  He denies any tachycardia concerning for arrhythmia  Heart rate is low, though is asymptomatic No near syncope or syncope  He does report having chronic fatigue. No regular exercise program but he is going to start Relatively sedentary at baseline, weight is high  No recent lipid panel since 2015  EKG on today's visit shows normal sinus rhythm with rate 49 beats a minute, no significant ST or T-wave changes  Other past medical history Previous 30 day monitor  showed runs of SVT, heart rate up to 177 beats per minute associated with dizziness. Continued to have runs of SVT even on diltiazem 120 mg daily   blood work showing BUN 20, creatinine 1.7. Baseline creatinine appears 1.4  PMH:   has a past medical history of Abnormality of gait; Arthritis; Aspiration pneumonia (HCC); BPH (benign prostatic hypertrophy); Chronic back pain; CKD (chronic kidney disease), stage III; Colon cancer ([place]) (1988); Diverticulosis; Dysphagia; Epistaxis; Essential hypertension; GERD (gastroesophageal reflux disease); H/O  calcium pyrophosphate deposition disease (CPPD); Hiatal hernia (01/06/2015); HLD (hyperlipidemia); HOH (hard of hearing); Impotence of organic origin; PSVT (paroxysmal supraventricular tachycardia) ([place]); REM sleep behavior disorder (02/21/2014); Rotator cuff rupture; Unspecified hearing loss; and Ventral hernia (1988).  PSH:    Past Surgical History:  Procedure Laterality Date  . EYE SURGERY Bilateral    cataract extraction with IOL bilaterally  . [place]  . INGUINAL HERNIA REPAIR Right 1963  . MASTECTOMY, PARTIAL Left 02/07/2013   Procedure: LEFT SUBCUTANEOUS MASTECTOMY;  Surgeon: Imogene Burn. Georgette Dover, MD;  Location: [facility];  Service: General;  Laterality: Left;  . ROTATOR CUFF REPAIR Right     [facility]  . ROTATOR CUFF REPAIR Right   . TOTAL KNEE ARTHROPLASTY  03/05/2012   Procedure: TOTAL KNEE ARTHROPLASTY;  Surgeon: Gearlean Alf, MD;  Location: [facility];  Service: Orthopedics;  Laterality: Left;  . [facility] [facility], x 4  . VEIN LIGATION AND STRIPPING Right     Current Outpatient Prescriptions  Medication Sig Dispense Refill  . aspirin (ASPIRIN EC) 81 MG EC tablet Take 81 mg by mouth daily. Swallow whole.    Marland Kitchen Bioflavonoid Products (ESTER C PO) Take by mouth.    . diltiazem (CARDIZEM CD) 120 MG 24 hr capsule TAKE ONE CAPSULE BY MOUTH DAILY 90 capsule 3  . flecainide (TAMBOCOR) 100 MG tablet TAKE 1 TABLET BY MOUTH TWICE A DAY 180 tablet 3  . lovastatin (MEVACOR) 20 MG tablet TAKE ONE TABLET BY MOUTH ONCE DAILY  90 tablet 0  . Multiple Vitamins-Minerals (CENTRUM SILVER ADULT 50+ PO) Take 1 tablet by mouth daily.    Marland Kitchen omeprazole (PRILOSEC) 20 MG capsule TAKE 1 CAPSULE BY MOUTH EVERY DAY AS NEEDED. 90 capsule 3  . oxyCODONE-acetaminophen (PERCOCET) 10-325 MG per tablet Take 1 tablet by mouth every 6 (six) hours as needed for pain (pain).     . sildenafil (REVATIO) 20 MG tablet TAKE 2 TO 5 TABLETS  (40-100MG ) BY MOUTH 30 MINUTES PRIOR TO INTERCOURSE AS DIRECTED 50 tablet 2  . traZODone (DESYREL) 50 MG tablet TAKE 1/2 TO 1 TABLET BY MOUTH AT BEDTIMEAS NEEDED FOR SLEEP 30 tablet 5  . triamcinolone cream (KENALOG) 0.1 % APPLY TO RASH TWICE A DAY FOR 10 DAYS, THEN DAILY FOR 10 DAYS 160 g 0  . vitamin B-12 (CYANOCOBALAMIN) 1000 MCG tablet Take 1,000 mcg by mouth daily.    . vitamin C (ASCORBIC ACID) 500 MG tablet Take 500 mg by mouth daily.     No current facility-administered medications for this visit.      Allergies:   Patient has no known allergies.   Social History:  The patient  reports that he has never smoked. He has never used smokeless tobacco. He reports that he drinks about 1.2 oz of alcohol per week . He reports that he does not use drugs.   Family History:   family history includes Congestive Heart Failure in his mother; Osteoarthritis in his father.    Review of Systems: Review of Systems  Constitutional: Positive for malaise/fatigue.  Respiratory: Negative.   Cardiovascular: Negative.   Gastrointestinal: Negative.   Musculoskeletal: Negative.   Neurological: Negative.   Psychiatric/Behavioral: Negative.   All other systems reviewed and are negative.    PHYSICAL EXAM: VS:  BP 126/62 (BP Location: Left Arm, Patient Position: Sitting, Cuff Size: Normal)   Pulse (!) 49   Ht 5\' 10"  (1.778 m)   Wt 216 lb 8 oz (98.2 kg)   BMI 31.06 kg/m  , BMI Body mass index is 31.06 kg/m. GEN: Well nourished, well developed, in no acute distress  HEENT: normal  Neck: no JVD, carotid bruits, or masses Cardiac: Regular rhythm, bradycardia; no murmurs, rubs, or gallops,no edema  Respiratory:  clear to auscultation bilaterally, normal work of breathing GI: soft, nontender, nondistended, + BS MS: no deformity or atrophy  Skin: warm and dry, no rash Neuro:  Strength and sensation are intact Psych: euthymic mood, full affect    Recent Labs: No results found for requested labs  within last 8760 hours.    Lipid Panel Lab Results  Component Value Date   CHOL 152 06/09/2014   HDL 54.70 06/09/2014   LDLCALC 81 06/09/2014   TRIG 84.0 06/09/2014      Wt Readings from Last 3 Encounters:  11/04/16 216 lb 8 oz (98.2 kg)  05/23/16 215 lb 12 oz (97.9 kg)  05/12/16 219 lb 4 oz (99.5 kg)       ASSESSMENT AND PLAN:  Hyperlipidemia LDL  - Plan: EKG 12-Lead Goal LDL less than 70 given severe coronary disease, PAD Needs repeat lipid panel, may need high-dose lovastatin  Essential hypertension - Plan: EKG 12-Lead Recommended he stop metoprolol given his bradycardia  SVT (supraventricular tachycardia) ([place]) - Plan: EKG 12-Lead Denies any recent symptoms of arrhythmia We'll decrease metoprolol as heart rate 49 and has fatigue  Coronary artery disease involving native coronary artery of native heart without angina pectoris Currently with no symptoms of angina. No further  workup at this time. Continue current medication regimen. Heavy coronary calcification seen on CT scan  Aortic atherosclerosis (HCC) Moderate diffuse disease on CT scan September 2017 Stressed importance of aggressive cholesterol management   Total encounter time more than 25 minutes  Greater than 50% was spent in counseling and coordination of care with the patient   Disposition:   F/U  6 months   Orders Placed This Encounter  Procedures  . EKG 12-Lead     Signed, Esmond Plants, M.D., Ph.D. 11/04/2016  [place], [place]

## 2016-11-17 ENCOUNTER — Other Ambulatory Visit: Payer: Self-pay | Admitting: Family Medicine

## 2016-11-17 NOTE — Telephone Encounter (Signed)
Last office visit 05/23/2016 for coughing up blood.  Last Lipid 06/09/2014.  Refill?

## 2016-11-20 ENCOUNTER — Other Ambulatory Visit: Payer: Self-pay | Admitting: Family Medicine

## 2016-11-29 ENCOUNTER — Other Ambulatory Visit: Payer: Self-pay | Admitting: Family Medicine

## 2016-11-30 NOTE — Telephone Encounter (Signed)
Last office visit 05/23/2016.  Last refilled 04/17/2016 for 160 g with no refills.  Ok to refill?

## 2016-12-08 ENCOUNTER — Encounter: Payer: Self-pay | Admitting: Family Medicine

## 2016-12-12 ENCOUNTER — Other Ambulatory Visit: Payer: Self-pay | Admitting: Family Medicine

## 2016-12-12 DIAGNOSIS — E7849 Other hyperlipidemia: Secondary | ICD-10-CM

## 2016-12-12 DIAGNOSIS — Z79899 Other long term (current) drug therapy: Secondary | ICD-10-CM

## 2016-12-12 DIAGNOSIS — H35033 Hypertensive retinopathy, bilateral: Secondary | ICD-10-CM | POA: Diagnosis not present

## 2016-12-12 DIAGNOSIS — R5383 Other fatigue: Secondary | ICD-10-CM

## 2016-12-12 DIAGNOSIS — E538 Deficiency of other specified B group vitamins: Secondary | ICD-10-CM

## 2016-12-12 DIAGNOSIS — I10 Essential (primary) hypertension: Secondary | ICD-10-CM | POA: Diagnosis not present

## 2016-12-12 DIAGNOSIS — Z9842 Cataract extraction status, left eye: Secondary | ICD-10-CM | POA: Diagnosis not present

## 2016-12-12 DIAGNOSIS — Z9841 Cataract extraction status, right eye: Secondary | ICD-10-CM | POA: Diagnosis not present

## 2016-12-12 DIAGNOSIS — E559 Vitamin D deficiency, unspecified: Secondary | ICD-10-CM

## 2016-12-12 DIAGNOSIS — H04123 Dry eye syndrome of bilateral lacrimal glands: Secondary | ICD-10-CM | POA: Diagnosis not present

## 2016-12-13 ENCOUNTER — Encounter: Payer: Self-pay | Admitting: Family Medicine

## 2016-12-13 ENCOUNTER — Other Ambulatory Visit (INDEPENDENT_AMBULATORY_CARE_PROVIDER_SITE_OTHER): Payer: Medicare Other

## 2016-12-13 DIAGNOSIS — Z79899 Other long term (current) drug therapy: Secondary | ICD-10-CM

## 2016-12-13 DIAGNOSIS — E784 Other hyperlipidemia: Secondary | ICD-10-CM

## 2016-12-13 DIAGNOSIS — R5383 Other fatigue: Secondary | ICD-10-CM

## 2016-12-13 DIAGNOSIS — E559 Vitamin D deficiency, unspecified: Secondary | ICD-10-CM

## 2016-12-13 DIAGNOSIS — E7849 Other hyperlipidemia: Secondary | ICD-10-CM

## 2016-12-13 DIAGNOSIS — E538 Deficiency of other specified B group vitamins: Secondary | ICD-10-CM | POA: Diagnosis not present

## 2016-12-13 LAB — BASIC METABOLIC PANEL
BUN: 24 mg/dL — ABNORMAL HIGH (ref 6–23)
CALCIUM: 9.1 mg/dL (ref 8.4–10.5)
CO2: 28 mEq/L (ref 19–32)
Chloride: 106 mEq/L (ref 96–112)
Creatinine, Ser: 1.42 mg/dL (ref 0.40–1.50)
GFR: 50.64 mL/min — AB (ref >60.00)
GLUCOSE: 97 mg/dL (ref 70–99)
POTASSIUM: 3.6 meq/L (ref 3.5–5.1)
SODIUM: 141 meq/L (ref 135–145)

## 2016-12-13 LAB — LIPID PANEL
CHOLESTEROL: 143 mg/dL (ref 0–200)
HDL: 60.9 mg/dL (ref >39.00)
LDL CALC: 65 mg/dL (ref 0–99)
NONHDL: 82.34
Total CHOL/HDL Ratio: 2
Triglycerides: 85 mg/dL (ref 0.0–149.0)
VLDL: 17 mg/dL (ref 0.0–40.0)

## 2016-12-13 LAB — HEPATIC FUNCTION PANEL
ALBUMIN: 4 g/dL (ref 3.5–5.2)
ALT: 26 U/L (ref 0–53)
AST: 34 U/L (ref 0–37)
Alkaline Phosphatase: 56 U/L (ref 39–117)
BILIRUBIN TOTAL: 0.6 mg/dL (ref 0.2–1.2)
Bilirubin, Direct: 0.1 mg/dL (ref 0.0–0.3)
Total Protein: 6.7 g/dL (ref 6.0–8.3)

## 2016-12-13 LAB — CBC WITH DIFFERENTIAL/PLATELET
BASOS ABS: 0.1 10*3/uL (ref 0.0–0.1)
Basophils Relative: 1.1 % (ref 0.0–3.0)
EOS PCT: 5.1 % — AB (ref 0.0–5.0)
Eosinophils Absolute: 0.2 10*3/uL (ref 0.0–0.7)
HEMATOCRIT: 40.3 % (ref 39.0–52.0)
HEMOGLOBIN: 13.8 g/dL (ref 13.0–17.0)
LYMPHS ABS: 1.2 10*3/uL (ref 0.7–4.0)
Lymphocytes Relative: 25.3 % (ref 12.0–46.0)
MCHC: 34.2 g/dL (ref 30.0–36.0)
MCV: 97.1 fl (ref 78.0–100.0)
MONOS PCT: 13.2 % — AB (ref 3.0–12.0)
Monocytes Absolute: 0.6 10*3/uL (ref 0.1–1.0)
NEUTROS PCT: 55.3 % (ref 43.0–77.0)
Neutro Abs: 2.6 10*3/uL (ref 1.4–7.7)
Platelets: 213 10*3/uL (ref 150.0–400.0)
RBC: 4.15 Mil/uL — AB (ref 4.22–5.81)
RDW: 13.1 % (ref 11.5–15.5)
WBC: 4.6 10*3/uL (ref 4.0–10.5)

## 2016-12-13 LAB — VITAMIN D 25 HYDROXY (VIT D DEFICIENCY, FRACTURES): VITD: 58.14 ng/mL (ref 30.00–100.00)

## 2016-12-13 LAB — VITAMIN B12: Vitamin B-12: 552 pg/mL (ref 211–911)

## 2016-12-13 LAB — TSH: TSH: 1.92 u[IU]/mL (ref 0.35–4.50)

## 2016-12-14 ENCOUNTER — Other Ambulatory Visit (INDEPENDENT_AMBULATORY_CARE_PROVIDER_SITE_OTHER): Payer: Medicare Other

## 2016-12-14 DIAGNOSIS — Z125 Encounter for screening for malignant neoplasm of prostate: Secondary | ICD-10-CM | POA: Diagnosis not present

## 2016-12-14 LAB — PSA, MEDICARE: PSA: 2.26 ng/mL (ref 0.10–4.00)

## 2016-12-19 ENCOUNTER — Ambulatory Visit (INDEPENDENT_AMBULATORY_CARE_PROVIDER_SITE_OTHER): Payer: Medicare Other | Admitting: Family Medicine

## 2016-12-19 ENCOUNTER — Encounter: Payer: Self-pay | Admitting: Family Medicine

## 2016-12-19 VITALS — BP 109/59 | HR 81 | Temp 98.4°F | Ht 68.5 in | Wt 208.8 lb

## 2016-12-19 DIAGNOSIS — Z0000 Encounter for general adult medical examination without abnormal findings: Secondary | ICD-10-CM | POA: Diagnosis not present

## 2016-12-19 MED ORDER — AVANAFIL 200 MG PO TABS: ORAL_TABLET | ORAL | 5 refills | Status: DC

## 2016-12-19 NOTE — Progress Notes (Signed)
Pre visit review using our clinic review tool, if applicable. No additional management support is needed unless otherwise documented below in the visit note. 

## 2016-12-19 NOTE — Progress Notes (Signed)
Dr. Karleen Hampshire T. Deseri Loss, MD, CAQ Sports Medicine [facility] Sports Medicine [address] [place], [address] Phone: [phone] Fax: [phone]  12/19/2016  Patient: Michael [name], MRN: [id number], DOB: 04-Jun-1932, 83 y.o.  Primary Physician:  [name], MD   Chief Complaint  Patient presents with  . Medicare Wellness   Subjective:   [name] is a 83 y.o. pleasant patient who presents for a medicare wellness examination:  Preventative Health Maintenance Visit:  Health Maintenance Summary Reviewed and updated, unless pt declines services.  Tobacco History Reviewed. Alcohol: No concerns, no excessive use Exercise Habits: Walking about 5 days a week, rec at least 30 mins 5 times a week STD concerns: no risk or activity to increase risk Drug Use: None Encouraged self-testicular check  Health Maintenance  Topic Date Due  . TETANUS/TDAP  03/26/1953  . INFLUENZA VACCINE  04/12/2017  . PNA vac Low Risk Adult  Completed    Immunization History  Administered Date(s) Administered  . Influenza,inj,Quad PF,36+ Mos 07/29/2015, 07/18/2016  . Pneumococcal Conjugate-13 12/22/2014  . Pneumococcal Polysaccharide-23 02/25/2013   His primary concern today is his erectile quality. Hit or miss results with sildenafil 100 mg. He wants to talk about other options.   Patient Active Problem List   Diagnosis Date Noted  . Aortic atherosclerosis (HCC) 11/04/2016  . Coronary artery disease involving native coronary artery of native heart without angina pectoris 11/04/2016  . HLD (hyperlipidemia)   . Hypertensive heart disease   . PSVT (paroxysmal supraventricular tachycardia) (HCC)   . Hiatal hernia 01/06/2015  . Aspiration pneumonia (HCC) 01/03/2015  . SVT (supraventricular tachycardia) (HCC) 04/18/2014  . 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 Impotence of organic origin   . Essential hypertension 01/08/2010  . BENIGN PROSTATIC HYPERTROPHY, WITH URINARY  OBSTRUCTION 01/07/2010  . HEARING LOSS, UNSPEC. 12/08/2008  . Hyperlipidemia LDL goal <100 07/23/2008  . GERD 07/23/2008  . CKD (chronic kidney disease) stage 3, GFR 30-59 ml/min 07/23/2008  . Colon cancer (HCC) 07/22/2008   Past Medical History:  Diagnosis Date  . Arthritis   . Aspiration pneumonia (HCC)   . BPH (benign prostatic hypertrophy)   . Chronic back pain   . CKD (chronic kidney disease), stage III   . Colon cancer [facility]) 1988   Surgery alone, no history of chemo  . Diverticulosis   . Dysphagia   . Epistaxis   . Essential hypertension   . GERD (gastroesophageal reflux disease)   . H/O calcium pyrophosphate deposition disease (CPPD)   . Hiatal hernia 01/06/2015  . HLD (hyperlipidemia)   . HOH (hard of hearing)    bilateral hearing aids  . Impotence of organic origin   . PSVT (paroxysmal supraventricular tachycardia) (HCC)    a. 04/2014 Event Monitor: Freq bouts of SVT with rates to 180-->seen by EP with recommendation for RFCA, pt deferred;  b. 05/2014 Echo: EF nl, mild conc LVH, no rwma, Gr1 DD, mildMR/TR.  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 Rotator cuff rupture    Right; s/p repair  . Unspecified hearing loss   . Ventral hernia 1988   Past Surgical History:  Procedure Laterality Date  . EYE SURGERY Bilateral    cataract extraction with IOL bilaterally  . HEMICOLOECTOMY W/ ANASTOMOSIS  1988   [name]  . INGUINAL HERNIA REPAIR Right 1963  . MASTECTOMY, PARTIAL Left 02/07/2013   Procedure: LEFT SUBCUTANEOUS MASTECTOMY;  Surgeon: Wilmon Arms. Corliss Skains, MD;  Location: [facility];  Service: General;  Laterality: Left;  . ROTATOR CUFF REPAIR Right     [facility]  . ROTATOR CUFF REPAIR Right   . TOTAL KNEE ARTHROPLASTY  03/05/2012   Procedure: TOTAL KNEE ARTHROPLASTY;  Surgeon: Loanne Drilling, MD;  Location[facility];  Service: Orthopedics;  Laterality: Left;  . UMBILICAL HERNIA REPAIR  [facility] Surgery, x 4  . VEIN LIGATION AND STRIPPING  Right    Social History   Social History  . Marital status: Married    Spouse name: [name]  . Number of children: 3  . Years of education: college   Occupational History  . Retired Retired   Social History Main Topics  . Smoking status: Never Smoker  . Smokeless tobacco: Never Used  . Alcohol use 1.2 oz/week    2 Glasses of wine per week     Comment: Wine (1/2-1 glass 2x/week)  . Drug use: No  . Sexual activity: Yes   Other Topics Concern  . Not on file   Social History Narrative   Married.  Independent with ADLs/ambulation.         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 Osteoarthritis Father   . Congestive Heart Failure Mother    No Known Allergies  Medication list has been reviewed and updated.   General: Denies fever, chills, sweats. No significant weight loss. Eyes: Denies blurring,significant itching ENT: Denies earache, sore throat, and hoarseness. Cardiovascular: Denies chest pains, palpitations, dyspnea on exertion Respiratory: Denies cough, dyspnea at rest,wheeezing Breast: no concerns about lumps GI: Denies nausea, vomiting, diarrhea, constipation, change in bowel habits, abdominal pain, melena, hematochezia GU: Intermittent ED, polyuria Musculoskeletal: Denies back pain, joint pain Derm: Denies rash, itching Neuro: Denies  paresthesias, frequent falls, frequent headaches Psych: Denies depression, anxiety Endocrine: Denies cold intolerance, heat intolerance, polydipsia Heme: Denies enlarged lymph nodes Allergy: No hayfever  Objective:   BP (!) 109/59   Pulse 81   Temp 98.4 F (36.9 C) (Oral)   Ht 5' 8.5" (1.74 m)   Wt 208 lb 12 oz (94.7 kg)   BMI 31.28 kg/m   The patient completed a fall screen  and PHQ-2 and PHQ-9 if necessary, which is documented in the EHR. The CMA/LPN/RN who assisted the patient verbally completed with them and documented results in [facility] link EHR.  Hearing Screening Comments: Wears bilateral hearing aides. Vision Screening Comments: Wears glasses-Eye Exam 12/08/16 with Dr. Franz Dell  GEN: well developed, well nourished, no acute distress Eyes: conjunctiva and lids normal, PERRLA, EOMI ENT: TM clear, nares clear, oral exam WNL Neck: supple, no lymphadenopathy, no thyromegaly, no JVD Pulm: clear to auscultation and percussion, respiratory effort normal CV: regular rate and rhythm, S1-S2, no murmur, rub or gallop, no bruits, peripheral pulses normal and symmetric, no cyanosis, clubbing, edema or varicosities GI: soft, non-tender; no hepatosplenomegaly, masses; active bowel sounds all quadrants GU: no hernia, testicular mass, penile discharge Lymph: no cervical, axillary or inguinal adenopathy MSK: gait normal, muscle tone and strength WNL, no joint swelling, effusions, discoloration, crepitus  SKIN: clear, good turgor,  color WNL, no rashes, lesions, or ulcerations Neuro: normal mental status, normal strength, sensation, and motion Psych: alert; oriented to person, place and time, normally interactive and not anxious or depressed in appearance.  All labs reviewed with patient.  Lipids:    Component Value Date/Time   CHOL 143 12/13/2016 1020   TRIG 85.0 12/13/2016 1020   HDL 60.90 12/13/2016 1020   LDLDIRECT 75.4 05/03/2011 1603   VLDL 17.0 12/13/2016 1020   CHOLHDL 2 12/13/2016 1020   CBC: CBC Latest Ref Rng & Units 12/13/2016 07/29/2015 01/05/2015  WBC 4.0 - 10.5 K/uL 4.6 5.8 7.0  Hemoglobin 13.0 - 17.0 g/dL 40.9 81.1 11.8(L)  Hematocrit 39.0 - 52.0 % 40.3 39.8 36.2(L)  Platelets 150.0 - 400.0 K/uL 213.0 192.0 188    Basic Metabolic Panel:    Component Value Date/Time   NA 141 12/13/2016 1020   K 3.6 12/13/2016 1020   CL 106 12/13/2016 1020    CO2 28 12/13/2016 1020   BUN 24 (H) 12/13/2016 1020   CREATININE 1.42 12/13/2016 1020   GLUCOSE 97 12/13/2016 1020   CALCIUM 9.1 12/13/2016 1020   Hepatic Function Latest Ref Rng & Units 12/13/2016 07/29/2015 01/03/2015  Total Protein 6.0 - 8.3 g/dL 6.7 6.6 6.3  Albumin 3.5 - 5.2 g/dL 4.0 4.1 9.1(Y)  AST 0 - 37 U/L 34 35 45(H)  ALT 0 - 53 U/L 26 30 38  Alk Phosphatase 39 - 117 U/L 56 59 50  Total Bilirubin 0.2 - 1.2 mg/dL 0.6 0.4 0.5  Bilirubin, Direct 0.0 - 0.3 mg/dL 0.1 0.1 -    Lab Results  Component Value Date   TSH 1.92 12/13/2016   Lab Results  Component Value Date   PSA 2.26 12/14/2016   PSA 1.11 06/09/2014   PSA 2.21 02/25/2013    Assessment and Plan:   Healthcare maintenance   1. Recommended against further PSA testing at 84 w/ no h/o cancer. He strongly requested this time. Recent years normal.  2. Trial of stendra. Not absolute contraindication, but my preference would be to use a product with shorter half-life than cialis given he has known CAD, age, arrythmia.   Health Maintenance Exam: The patient's preventative maintenance and recommended screening tests for an annual wellness exam were reviewed in full today. Brought up to date unless services declined.  Counselled on the importance of diet, exercise, and its role in overall health and mortality. The patient's FH and SH was reviewed, including their home life, tobacco status, and drug and alcohol status.  Follow-up in 1 year for physical exam or additional follow-up below.  I have personally reviewed the Medicare Annual Wellness questionnaire and have noted 1. The patient's medical and social history 2. Their use of alcohol, tobacco or illicit drugs 3. Their current medications and supplements 4. The patient's functional ability including ADL's, fall risks, home safety risks and hearing or visual             impairment. 5. Diet and physical activities 6. Evidence for depression or mood  disorders 7. Reviewed Updated provider list, see scanned forms and [facility] Snapshot.   The patients weight, height, BMI and visual acuity have been recorded in the chart I have made referrals, counseling and provided education to the patient based review of the above and I have provided the pt with a written personalized care plan for preventive services.  I have provided the patient with a copy of your personalized plan for preventive services. Instructed to take  the time to review along with their updated medication list.  Follow-up: No Follow-up on file. Or follow-up in 1 year if not noted.  Meds ordered this encounter  Medications  . Avanafil (STENDRA) 200 MG TABS    Sig: 1 tab po 30 mins prior to intercourse    Dispense:  10 tablet    Refill:  5   Medications Discontinued During This Encounter  Medication Reason  . sildenafil (REVATIO) 20 MG tablet     Signed,  Jed Kutch T. Sapna Padron, MD   Allergies as of 12/19/2016   No Known Allergies     Medication List       Accurate as of 12/19/16 11:59 PM. Always use your most recent med list.          aspirin EC 81 MG EC tablet Generic drug:  aspirin Take 81 mg by mouth daily. Swallow whole.   Avanafil 200 MG Tabs Commonly known as:  STENDRA 1 tab po 30 mins prior to intercourse   CENTRUM SILVER ADULT 50+ PO Take 1 tablet by mouth daily.   diltiazem 120 MG 24 hr capsule Commonly known as:  CARDIZEM CD TAKE ONE CAPSULE BY MOUTH DAILY   ESTER C PO Take by mouth.   flecainide 100 MG tablet Commonly known as:  TAMBOCOR TAKE 1 TABLET BY MOUTH TWICE A DAY   lovastatin 20 MG tablet Commonly known as:  MEVACOR TAKE ONE TABLET BY MOUTH ONCE DAILY   omeprazole 20 MG capsule Commonly known as:  PRILOSEC TAKE 1 CAPSULE BY MOUTH EVERY DAY AS NEEDED.   oxyCODONE-acetaminophen 10-325 MG tablet Commonly known as:  PERCOCET Take 1 tablet by mouth every 6 (six) hours as needed for pain (pain).   traZODone 50 MG tablet Commonly  known as:  DESYREL TAKE 1/2 TO 1 TABLET BY MOUTH AT BEDTIMEAS NEEDED FOR SLEEP   triamcinolone cream 0.1 % Commonly known as:  KENALOG APPLY TO RASH TWICE A DAY FOR 10 DAYS, THEN DAILY FOR 10 DAYS   vitamin B-12 1000 MCG tablet Commonly known as:  CYANOCOBALAMIN Take 1,000 mcg by mouth daily.   vitamin C 500 MG tablet Commonly known as:  ASCORBIC ACID Take 500 mg by mouth daily.

## 2016-12-20 ENCOUNTER — Encounter: Payer: Self-pay | Admitting: Family Medicine

## 2016-12-20 ENCOUNTER — Telehealth: Payer: Self-pay | Admitting: *Deleted

## 2016-12-20 NOTE — Telephone Encounter (Signed)
PA denied.  Medicare is excluded from Medicare coverage by law and not offered as a supplemental benefit.  [facility] notified of denial via fax.

## 2016-12-20 NOTE — Telephone Encounter (Signed)
Received fax from [facility] requesting PA for Strendra 20 mg.  PA completed on CoverMyMeds.  Sent for review. Can take up to 72 hours for decision.

## 2016-12-22 MED ORDER — TADALAFIL 5 MG PO TABS: 5.0000 mg | ORAL_TABLET | Freq: Every day | ORAL | 11 refills | Status: DC

## 2016-12-22 NOTE — Addendum Note (Signed)
Addended by: Owens Loffler on: 12/22/2016 07:00 PM   Modules accepted: Orders

## 2016-12-26 NOTE — Telephone Encounter (Signed)
Tried to see if we could get Cialis 5 mg approved for BPH but will not be covered due to patient has not tired preferred alternatives like alfuzosin ER, doxazosin, rapaflo, tamsulosin or terazosin.  [name] notified by telephone.

## 2017-01-24 ENCOUNTER — Other Ambulatory Visit: Payer: Self-pay | Admitting: Family Medicine

## 2017-01-24 NOTE — Telephone Encounter (Signed)
Left message for Michael [name] to return my call.  Need to verify if patient is taking the sildenafil.

## 2017-01-24 NOTE — Addendum Note (Signed)
Addended by: Carter Kitten on: 01/24/2017 03:35 PM   Modules accepted: Orders

## 2017-01-25 ENCOUNTER — Other Ambulatory Visit: Payer: Self-pay | Admitting: Family Medicine

## 2017-01-25 DIAGNOSIS — M48062 Spinal stenosis, lumbar region with neurogenic claudication: Secondary | ICD-10-CM | POA: Diagnosis not present

## 2017-03-05 ENCOUNTER — Other Ambulatory Visit: Payer: Self-pay | Admitting: Family Medicine

## 2017-03-06 NOTE — Telephone Encounter (Signed)
Received refill electronically Last office visit 12/19/16 See drug warning with Dilitiazem

## 2017-03-19 ENCOUNTER — Emergency Department (HOSPITAL_COMMUNITY)
Admission: EM | Admit: 2017-03-19 | Discharge: 2017-03-19 | Disposition: A | Discharge status: Home / Self Care | Payer: Medicare Other | Attending: Emergency Medicine | Admitting: Emergency Medicine

## 2017-03-19 ENCOUNTER — Emergency Department (HOSPITAL_COMMUNITY): Payer: Medicare Other

## 2017-03-19 ENCOUNTER — Encounter (HOSPITAL_COMMUNITY): Payer: Self-pay | Admitting: Emergency Medicine

## 2017-03-19 DIAGNOSIS — I2510 Atherosclerotic heart disease of native coronary artery without angina pectoris: Secondary | ICD-10-CM | POA: Diagnosis not present

## 2017-03-19 DIAGNOSIS — R05 Cough: Secondary | ICD-10-CM | POA: Diagnosis not present

## 2017-03-19 DIAGNOSIS — Z79899 Other long term (current) drug therapy: Secondary | ICD-10-CM | POA: Diagnosis not present

## 2017-03-19 DIAGNOSIS — N183 Chronic kidney disease, stage 3 (moderate): Secondary | ICD-10-CM | POA: Diagnosis not present

## 2017-03-19 DIAGNOSIS — I129 Hypertensive chronic kidney disease with stage 1 through stage 4 chronic kidney disease, or unspecified chronic kidney disease: Secondary | ICD-10-CM | POA: Diagnosis not present

## 2017-03-19 DIAGNOSIS — R918 Other nonspecific abnormal finding of lung field: Secondary | ICD-10-CM | POA: Diagnosis not present

## 2017-03-19 DIAGNOSIS — R059 Cough, unspecified: Secondary | ICD-10-CM

## 2017-03-19 MED ORDER — DOXYCYCLINE HYCLATE 100 MG PO CAPS: 100.0000 mg | ORAL_CAPSULE | Freq: Two times a day (BID) | ORAL | 0 refills | Status: DC

## 2017-03-19 NOTE — ED Provider Notes (Signed)
[facility] DEPT Provider Note   CSN: [id number] Arrival date & time: 03/19/17  1359     History   Chief Complaint Chief Complaint  Patient presents with  . URI    The patient has been coughing up phlegm since last night and it is streaked with blood.  This morning he was having chills, but he denies any other symptoms./    HPI Michael [name] is a 83 y.o. male.  The history is provided by the patient.  Illness  This is a recurrent problem. The current episode started 12 to 24 hours ago. The problem occurs constantly. The problem has not changed since onset.Pertinent negatives include no chest pain, no abdominal pain, no headaches and no shortness of breath. Associated symptoms comments: Cough with clear phlegm with small blood streaks. Nothing aggravates the symptoms. Nothing relieves the symptoms. He has tried nothing for the symptoms.    Past Medical History:  Diagnosis Date  . Arthritis   . Aspiration pneumonia ([place])   . BPH (benign prostatic hypertrophy)   . Chronic back pain   . CKD (chronic kidney disease), stage III   . Colon cancer [facility]) 1988   Surgery alone, no history of chemo  . Diverticulosis   . Essential hypertension   . GERD (gastroesophageal reflux disease)   . H/O calcium pyrophosphate deposition disease (CPPD)   . Hiatal hernia 01/06/2015  . HLD (hyperlipidemia)   . HOH (hard of hearing)    bilateral hearing aids  . Impotence of organic origin   . PSVT (paroxysmal supraventricular tachycardia) ([place])    a. 04/2014 Event Monitor: Freq bouts of SVT with rates to 180-->seen by EP with recommendation for RFCA, pt deferred;  b. 05/2014 Echo: EF nl, mild conc LVH, no rwma, Gr1 DD, mildMR/TR.  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 Rotator cuff rupture    Right; s/p repair  . Unspecified hearing loss   . Ventral hernia 1988    Patient Active Problem List   Diagnosis Date Noted  . Aortic atherosclerosis ([place]) 11/04/2016  . Coronary artery disease  involving native coronary artery of native heart without angina pectoris 11/04/2016  . HLD (hyperlipidemia)   . Hypertensive heart disease   . PSVT (paroxysmal supraventricular tachycardia) ([place])   . Hiatal hernia 01/06/2015  . Aspiration pneumonia ([place]) 01/03/2015  . SVT (supraventricular tachycardia) ([place]) 04/18/2014  . 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 Impotence of organic origin   . Essential hypertension 01/08/2010  . BENIGN PROSTATIC HYPERTROPHY, WITH URINARY OBSTRUCTION 01/07/2010  . HEARING LOSS, UNSPEC. 12/08/2008  . Hyperlipidemia LDL goal <100 07/23/2008  . GERD 07/23/2008  . CKD (chronic kidney disease) stage 3, GFR 30-59 ml/min 07/23/2008  . Colon cancer ([place]) 07/22/2008    Past Surgical History:  Procedure Laterality Date  . EYE SURGERY Bilateral    cataract extraction with IOL bilaterally  . [place]  . INGUINAL HERNIA REPAIR Right 1963  . MASTECTOMY, PARTIAL Left 02/07/2013   Procedure: LEFT SUBCUTANEOUS MASTECTOMY;  Surgeon: Imogene Burn. Georgette Dover, MD;  Location: [facility] [place] [facility];  Service: General;  Laterality: Left;  . ROTATOR CUFF REPAIR Right     [facility]  . ROTATOR CUFF REPAIR Right   . TOTAL KNEE ARTHROPLASTY  03/05/2012   Procedure: TOTAL KNEE ARTHROPLASTY;  Surgeon: Gearlean Alf, MD;  Location: [facility];  Service: Orthopedics;  Laterality: Left;  . [place]  Surgery, x 4  . VEIN LIGATION AND STRIPPING Right        Home Medications    Prior to Admission medications   Medication Sig Start Date End Date Taking? Authorizing Provider  aspirin (ASPIRIN EC) 81 MG EC tablet Take 81 mg by mouth daily. Swallow whole.    [provider]  Bioflavonoid Products (ESTER C PO) Take by mouth.    [provider]  diltiazem (CARDIZEM CD) 120 MG 24 hr capsule TAKE ONE CAPSULE BY MOUTH DAILY 10/27/16   Minna Merritts, MD  doxycycline (VIBRAMYCIN) 100 MG capsule  Take 1 capsule (100 mg total) by mouth 2 (two) times daily. 03/19/17   Valda Lamb, MD  flecainide (TAMBOCOR) 100 MG tablet TAKE 1 TABLET BY MOUTH TWICE A DAY 10/10/16   Minna Merritts, MD  lovastatin (MEVACOR) 20 MG tablet TAKE 1 TABLET BY MOUTH ONCE DAILY 03/06/17   Copland, Frederico Hamman, MD  Multiple Vitamins-Minerals (CENTRUM SILVER ADULT 50+ PO) Take 1 tablet by mouth daily.    [provider]  omeprazole (PRILOSEC) 20 MG capsule TAKE 1 CAPSULE BY MOUTH EVERY DAY AS NEEDED. 12/15/14   Copland, Frederico Hamman, MD  oxyCODONE-acetaminophen (PERCOCET) 10-325 MG per tablet Take 1 tablet by mouth every 6 (six) hours as needed for pain (pain).  10/10/14   [provider]  sildenafil (REVATIO) 20 MG tablet TAKE 2 TO 5 TABLETS BY MOUTH 30 MINUTES PRIOR TO INTERCOURSE AS DIRECTED 01/24/17   Copland, Frederico Hamman, MD  traZODone (DESYREL) 50 MG tablet TAKE ONE-HALF TO ONE TABLET BY MOUTH AT BEDTIME AS NEEDED FOR SLEEP 01/25/17   Copland, Frederico Hamman, MD  triamcinolone cream (KENALOG) 0.1 % APPLY TO RASH TWICE A DAY FOR 10 DAYS, THEN DAILY FOR 10 DAYS 11/30/16   Copland, Frederico Hamman, MD  vitamin B-12 (CYANOCOBALAMIN) 1000 MCG tablet Take 1,000 mcg by mouth daily.    [provider]  vitamin C (ASCORBIC ACID) 500 MG tablet Take 500 mg by mouth daily.    [provider]    Family History Family History  Problem Relation Age of Onset  . Osteoarthritis Father   . Congestive Heart Failure Mother     Social History Social History  Substance Use Topics  . Smoking status: Never Smoker  . Smokeless tobacco: Never Used  . Alcohol use 1.2 oz/week    2 Glasses of wine per week     Comment: Wine (1/2-1 glass 2x/week)     Allergies   Patient has no known allergies.   Review of Systems Review of Systems  Constitutional: Negative for chills and fever.  HENT: Negative for ear pain and sore throat.   Eyes: Negative for pain and visual disturbance.  Respiratory: Positive for cough. Negative for  chest tightness, shortness of breath and wheezing.   Cardiovascular: Negative for chest pain and palpitations.  Gastrointestinal: Negative for abdominal pain and vomiting.  Genitourinary: Negative for dysuria and hematuria.  Musculoskeletal: Negative for arthralgias and back pain.  Skin: Negative for color change and rash.  Neurological: Negative for seizures, syncope and headaches.  All other systems reviewed and are negative.    Physical Exam Updated Vital Signs BP 125/71   Pulse 71   Temp 98.2 F (36.8 C) (Oral)   Resp 18   Ht 5\' 10"  (1.778 m)   Wt 95.3 kg (210 lb)   SpO2 95%   BMI 30.13 kg/m   Physical Exam  Constitutional: He appears well-developed and well-nourished.  HENT:  Head: Normocephalic and  atraumatic.  Eyes: Conjunctivae are normal.  Neck: Neck supple.  Cardiovascular: Normal rate and regular rhythm.   No murmur heard. Pulmonary/Chest: Effort normal and breath sounds normal. No respiratory distress. He has no wheezes. He has no rales. He exhibits no tenderness.  Abdominal: Soft. There is no tenderness.  Musculoskeletal: He exhibits no edema.  Neurological: He is alert.  Skin: Skin is warm and dry.  Psychiatric: He has a normal mood and affect.  Nursing note and vitals reviewed.    ED Treatments / Results  Labs (all labs ordered are listed, but only abnormal results are displayed) Labs Reviewed - No data to display  EKG  EKG Interpretation None       Radiology Dg Chest 2 View  Result Date: 03/19/2017 CLINICAL DATA:  Bright red blood in sputum since 4 a.m. EXAM: CHEST  2 VIEW COMPARISON:  05/23/2016 FINDINGS: Asymmetric airspace disease on the left. In this clinical setting this could reflect alveolar hemorrhage or infection, but appearance is very similar to prior, when there was ground-glass opacity by CT no Kerley lines, air bronchograms, or pneumothorax. Normal heart size and mediastinal contours. No acute osseous finding. IMPRESSION:  Left-sided airspace disease. This could reflect alveolar hemorrhage or infection in this setting, but pattern is very similar to that seen 05/23/2016 and is more likely to reflect chronic lung disease. Once back at baseline, recommend updated chest CT. Electronically Signed   By: Monte Fantasia M.D.   On: 03/19/2017 15:32    Procedures Procedures (including critical care time)  Medications Ordered in ED Medications - No data to display   Initial Impression / Assessment and Plan / ED Course  I have reviewed the triage vital signs and the nursing notes.  Pertinent labs & imaging results that were available during my care of the patient were reviewed by me and considered in my medical decision making (see chart for details).     JADIE COMAS is a 83 year old male with history of CAD, previous episodes of pneumonia is coming in with cough that started at 1 AM last night productive of clear phlegm occasionally streaked with blood. Denies any fevers, nausea, vomiting, shortness of breath. No chest pain. No recent weight loss or travel.  Chest x-ray shows left-sided airspace disease possibly affecting alveolar hemorrhage or infection however pattern similar to previous chest x-ray in September. Considering history of pneumonia, we'll treat with doxycycline as outpatient and he was instructed to follow up with primary care provider for outpatient CT once he is back to baseline. This chest x-ray is similar to previous. Do not believe any further workup is necessary at this time. Patient given the number to call pulmonology for follow-up appointment as well. Also given 1 week course of doxycycline concern for community-acquired pneumonia. Given strict return precautions and through sheer decision making the patient was comfortable with discharge home and he was discharged in stable condition.  Patient was seen with my attending, Dr. Jeneen Rinks, who voiced agreement and oversaw the evaluation and treatment  of this patient.   Dr[employer] seismologist was used in the creation of this note. If there are any errors or inconsistencies needing clarification, please contact me directly.     Final Clinical Impressions(s) / ED Diagnoses   Final diagnoses:  Cough    New Prescriptions New Prescriptions   DOXYCYCLINE (VIBRAMYCIN) 100 MG CAPSULE    Take 1 capsule (100 mg total) by mouth 2 (two) times daily.     Valda Lamb,  MD 03/19/17 1623    Tanna Furry, MD 03/23/17 [phone]

## 2017-03-19 NOTE — ED Triage Notes (Signed)
The patient has been coughing up phlegm since last night and it is streaked with blood.  This morning he was having chills, but he denies any other symptoms.  The patient denies any other symptoms.

## 2017-03-19 NOTE — ED Provider Notes (Signed)
Pt seen and evaluated. D/W Dr. Manya Silvas. Pt with cough, and trace hemoptysis. History of same 05/2016. States treated, and resolved, but never say Pulmonary. No h/o Lung CA, DVt, PE.  Afebrile, but shakes and chills today. CXR + LUL and LLL infiltrate. Differential infection, hemorrhage, pneumonitis. Agree with doxy. Precautions to return with dyspnea, other worsening.  Plan Doxy, Pulmonary follow up.   Tanna Furry, MD 03/19/17 [phone]

## 2017-03-20 ENCOUNTER — Telehealth: Payer: Self-pay

## 2017-03-20 NOTE — Telephone Encounter (Signed)
Per chart review tab pt was seen [place] on 03/19/17.

## 2017-03-20 NOTE — Telephone Encounter (Signed)
PLEASE NOTE: All timestamps contained within this report are represented as [place] Standard Time. CONFIDENTIALTY NOTICE: This fax transmission is intended only for the addressee. It contains information that is legally privileged, confidential or otherwise protected from use or disclosure. If you are not the intended recipient, you are strictly prohibited from reviewing, disclosing, copying using or disseminating any of this information or taking any action in reliance on or regarding this information. If you have received this fax in error, please notify us immediately by telephone so that we can arrange for its return to [place]. Phone: [phone], Toll-Free: [phone], Fax: [phone] Page: 1 of 2 Call Id: [id number] [place] Patient Name: Michael [name] Gender: Male DOB: Jun 18, 1932 Age: 83 Y 11 M 23 D Return Phone Number: [phone] (Primary), [phone] (Secondary) City/State/Zip: [place] [address] Client [facility] Night - Client Client Site [place] Physician Copland, Frederico Hamman - MD Who Is Calling Patient / Member / Family / Caregiver Call Type Triage / Clinical Caller Name Shermar Friedland [facility] [place] ) Relationship To Patient Spouse Return Phone Number [phone] (Primary) Chief Complaint Coughing Up Blood Reason for Call Symptomatic / Request for [place] says , husband has chills, temp 99, feels cold, coughing up blood in mucus. Nurse Assessment Nurse: Germain Osgood RN, Opal Sidles Date/Time Eilene Ghazi Time): 03/19/2017 9:38:37 AM Confirm and document reason for call. If symptomatic, describe symptoms. ---Caller verified her husband has chills, temp 99, feels cold, coughing up blood in mucus. Blood is mixed streaks of blood. Has trouble with back / spine. Reports cough is not harsh. Never has smoked Does the PT  have any chronic conditions? (i.e. diabetes, asthma, etc.) ---Yes List chronic conditions. ---as noted: only med Fluccinate Has Hypertension Guidelines Guideline Title Affirmed Question Coughing Up Blood Coughing up rusty-colored sputum Disp. Time Eilene Ghazi Time) Disposition Final User 03/19/2017 9:52:17 AM See Physician within 4 Hours (or PCP triage) Yes Germain Osgood, RN, [facility] - ED Care Advice Given Per Guideline SEE PHYSICIAN WITHIN 24 HOURS: SEE PHYSICIAN WITHIN 4 HOURS (or PCP triage): CALL BACK IF: * You become worse. CARE ADVICE given per Coughing Up Blood guideline. Comments User: Susanne Greenhouse, RN Date/Time Eilene Ghazi Time): 03/19/2017 9:52:02 AM Has been chilling for over 1 hour, coughing up blood for about 4 hours PLEASE NOTE: All timestamps contained within this report are represented as [place] Standard Time. CONFIDENTIALTY NOTICE: This fax transmission is intended only for the addressee. It contains information that is legally privileged, confidential or otherwise protected from use or disclosure. If you are not the intended recipient, you are strictly prohibited from reviewing, disclosing, copying using or disseminating any of this information or taking any action in reliance on or regarding this information. If you have received this fax in error, please notify us immediately by telephone so that we can arrange for its return to [place]. Phone: [phone], Toll-Free: [phone], Fax: [phone] Page: 2 of 2 Call Id: [id number]

## 2017-03-27 ENCOUNTER — Telehealth: Payer: Self-pay | Admitting: Cardiovascular Disease

## 2017-03-27 NOTE — Telephone Encounter (Signed)
No answer. Left message to call back.  Need to confirm what refills are needed. Patient has appt coming up with Dr Rockey Situ on 05/05/17.

## 2017-03-27 NOTE — Telephone Encounter (Signed)
Patient would like paper refill.

## 2017-03-27 NOTE — Telephone Encounter (Signed)
Patient spouse calling [facility] .  Patient will call back with fax number .

## 2017-03-27 NOTE — Telephone Encounter (Signed)
Patient wife calling back she will come pick up these paper rx and 2 years of medical records.  Please give Marissa Calamity. The rx to add to medical record packet for patient pick up.

## 2017-03-28 NOTE — Telephone Encounter (Signed)
S/w patient's wife, ok per DPR. She does not need any paper refills at this time. She only needs the 2 years medical records to take to the [place] so they know why patient is on the prescriptions. Patient has enough medications at this time. She verbalized understanding of upcoming appt date and time with Dr Rockey Situ in August.

## 2017-03-29 NOTE — Telephone Encounter (Signed)
Patient requesting last 2 years of cardiology records to take to [place].  Printed 09/12/2014-present.  Placed Records and ROI for patient to sign at front desk. 03/29/17 [facility]

## 2017-04-12 ENCOUNTER — Telehealth: Payer: Self-pay | Admitting: Family Medicine

## 2017-04-12 ENCOUNTER — Other Ambulatory Visit: Payer: Self-pay | Admitting: Family Medicine

## 2017-04-12 DIAGNOSIS — N63 Unspecified lump in unspecified breast: Secondary | ICD-10-CM

## 2017-04-12 NOTE — Telephone Encounter (Signed)
done

## 2017-04-12 NOTE — Telephone Encounter (Signed)
Pt is overdue for diagnostic mammogram and was told that he needed to have an order put in  cb number is [phone] Thanks

## 2017-04-13 ENCOUNTER — Other Ambulatory Visit: Payer: Self-pay

## 2017-04-13 NOTE — Telephone Encounter (Signed)
Spoke with [facility] [place], she stated since mr efired follow up with general surgery 01/2013 and had mastectomy he does not need to follow up with them unless he is having problems with right breast.  I spoke with Mr Coralee Rud he stated he was not having any problems with right breast he was just follow up because he received a notice a while ago to schedule appointment.  I spoke with [facility] again.  They stated they sent note because they wanted to make sure he followed up with general surgeon.  Can you cancel diagnostic mammogram order. Thanks

## 2017-04-13 NOTE — Telephone Encounter (Signed)
Done. Cancelled diagnostic mammogram order.

## 2017-04-13 NOTE — Telephone Encounter (Signed)
I don't know how to do this? Can you help?

## 2017-04-24 ENCOUNTER — Other Ambulatory Visit: Payer: Self-pay | Admitting: Dermatology

## 2017-04-24 DIAGNOSIS — C44311 Basal cell carcinoma of skin of nose: Secondary | ICD-10-CM | POA: Diagnosis not present

## 2017-04-24 DIAGNOSIS — D229 Melanocytic nevi, unspecified: Secondary | ICD-10-CM | POA: Diagnosis not present

## 2017-04-24 DIAGNOSIS — L570 Actinic keratosis: Secondary | ICD-10-CM | POA: Diagnosis not present

## 2017-04-24 DIAGNOSIS — L821 Other seborrheic keratosis: Secondary | ICD-10-CM | POA: Diagnosis not present

## 2017-04-24 DIAGNOSIS — L814 Other melanin hyperpigmentation: Secondary | ICD-10-CM | POA: Diagnosis not present

## 2017-05-04 NOTE — Progress Notes (Signed)
Cardiology Office Note  Date:  05/05/2017   ID:  Michael [name], Michael [name] June 07, 1932, MRN [id number]  PCP:  Owens Loffler, MD   Chief Complaint  Patient presents with  . other    6 month follow up. Meds reviewed by the pt. verbally. "doing well."     HPI:  Michael [name] is a pleasant 83 year old gentleman with history of Three-vessel CAD on CT scan Moderate diffuse aortic athero  total knee replacement,  renal dysfunction,  SVT previously seen by Dr. Rayann Heman and plan was made for SVT ablation. in the end he changed his mind and canceled the procedure He presents today for follow-up of his SVT and CAD  In follow-up today he reports having spells described as dizziness and neck pain Often spells are triggered when he will bend over, or if he gets overheated These situations  brings on spells Typically seems to happen in the afternoon 1 to 3 pm Sometimes with associated Nausea, neck pain He has not checked his blood pressure or heart rate when he has episodes  Interestingly, has been walking 1 mile a day, no sx with exertion Chronic fatigue  Lab work reviewed with him Total chol 143, LDL 65  EKG on today's visit shows normal sinus rhythm with rate 64 beats a minute, no significant ST or T-wave changes  Other past medical history reviewed CT scan September 2017 for pneumonia reviewed with him in detail extensive coronary calcifications, all 3 coronary vessels , at least moderate diffuse aortic atherosclerosis  Previous 30 day monitor  showed runs of SVT, heart rate up to 177 beats per minute associated with dizziness. Continued to have runs of SVT even on diltiazem 120 mg daily   blood work showing BUN 20, creatinine 1.7. Baseline creatinine appears 1.4  PMH:   has a past medical history of Arthritis; Aspiration pneumonia (HCC); BPH (benign prostatic hypertrophy); Chronic back pain; CKD (chronic kidney disease), stage III; Colon cancer ([place]) (1988); Diverticulosis; Essential  hypertension; GERD (gastroesophageal reflux disease); H/O calcium pyrophosphate deposition disease (CPPD); Hiatal hernia (01/06/2015); HLD (hyperlipidemia); HOH (hard of hearing); Impotence of organic origin; PSVT (paroxysmal supraventricular tachycardia) ([place]); REM sleep behavior disorder (02/21/2014); Rotator cuff rupture; Unspecified hearing loss; and Ventral hernia (1988).  PSH:    Past Surgical History:  Procedure Laterality Date  . EYE SURGERY Bilateral    cataract extraction with IOL bilaterally  . [place]  . INGUINAL HERNIA REPAIR Right 1963  . MASTECTOMY, PARTIAL Left 02/07/2013   Procedure: LEFT SUBCUTANEOUS MASTECTOMY;  Surgeon: Imogene Burn. Georgette Dover, MD;  Location: [facility];  Service: General;  Laterality: Left;  . ROTATOR CUFF REPAIR Right     [facility]  . ROTATOR CUFF REPAIR Right   . TOTAL KNEE ARTHROPLASTY  03/05/2012   Procedure: TOTAL KNEE ARTHROPLASTY;  Surgeon: Gearlean Alf, MD;  Location: [facility];  Service: Orthopedics;  Laterality: Left;  . [place] [facility], x 4  . VEIN LIGATION AND STRIPPING Right     Current Outpatient Prescriptions  Medication Sig Dispense Refill  . aspirin (ASPIRIN EC) 81 MG EC tablet Take 81 mg by mouth daily. Swallow whole.    . diltiazem (CARDIZEM CD) 120 MG 24 hr capsule TAKE ONE CAPSULE BY MOUTH DAILY 90 capsule 3  . flecainide (TAMBOCOR) 100 MG tablet TAKE 1 TABLET BY MOUTH TWICE A DAY 180 tablet 3  . lovastatin (MEVACOR) 20 MG tablet  TAKE 1 TABLET BY MOUTH ONCE DAILY 90 tablet 2  . Multiple Vitamins-Minerals (CENTRUM SILVER ADULT 50+ PO) Take 1 tablet by mouth daily.    Marland Kitchen omeprazole (PRILOSEC) 20 MG capsule TAKE 1 CAPSULE BY MOUTH EVERY DAY AS NEEDED. 90 capsule 3  . oxyCODONE-acetaminophen (PERCOCET) 10-325 MG per tablet Take 1 tablet by mouth every 6 (six) hours as needed for pain (pain).     . sildenafil (REVATIO) 20 MG tablet TAKE 2 TO 5 TABLETS  BY MOUTH 30 MINUTES PRIOR TO INTERCOURSE AS DIRECTED 50 tablet 2  . traZODone (DESYREL) 50 MG tablet TAKE ONE-HALF TO ONE TABLET BY MOUTH AT BEDTIME AS NEEDED FOR SLEEP 90 tablet 1  . triamcinolone cream (KENALOG) 0.1 % APPLY TO RASH TWICE A DAY FOR 10 DAYS, THEN DAILY FOR 10 DAYS 160 g 0  . vitamin B-12 (CYANOCOBALAMIN) 1000 MCG tablet Take 1,000 mcg by mouth daily.    . vitamin C (ASCORBIC ACID) 500 MG tablet Take 500 mg by mouth daily.     No current facility-administered medications for this visit.      Allergies:   Patient has no known allergies.   Social History:  The patient  reports that he has never smoked. He has never used smokeless tobacco. He reports that he drinks about 1.2 oz of alcohol per week . He reports that he does not use drugs.   Family History:   family history includes Congestive Heart Failure in his mother; Osteoarthritis in his father.    Review of Systems: Review of Systems  Respiratory: Negative.   Cardiovascular: Negative.   Gastrointestinal: Negative.   Musculoskeletal: Positive for neck pain.  Neurological: Positive for dizziness.  Psychiatric/Behavioral: Negative.   All other systems reviewed and are negative.    PHYSICAL EXAM: VS:  BP 130/70 (BP Location: Left Arm, Patient Position: Sitting, Cuff Size: Normal)   Pulse 63   Ht 5' 10.5" (1.791 m)   Wt 206 lb 8 oz (93.7 kg)   BMI 29.21 kg/m  , BMI Body mass index is 29.21 kg/m. GEN: Well nourished, well developed, in no acute distress  HEENT: normal  Neck: no JVD, carotid bruits, or masses Cardiac: Regular rhythm, bradycardia; no murmurs, rubs, or gallops,no edema  Respiratory:  clear to auscultation bilaterally, normal work of breathing GI: soft, nontender, nondistended, + BS MS: no deformity or atrophy  Skin: warm and dry, no rash Neuro:  Strength and sensation are intact Psych: euthymic mood, full affect    Recent Labs: 12/13/2016: ALT 26; BUN 24; Creatinine, Ser 1.42; Hemoglobin  13.8; Platelets 213.0; Potassium 3.6; Sodium 141; TSH 1.92    Lipid Panel Lab Results  Component Value Date   CHOL 143 12/13/2016   HDL 60.90 12/13/2016   LDLCALC 65 12/13/2016   TRIG 85.0 12/13/2016      Wt Readings from Last 3 Encounters:  05/05/17 206 lb (93.4 kg)  05/05/17 206 lb 8 oz (93.7 kg)  03/19/17 210 lb (95.3 kg)       ASSESSMENT AND PLAN:  Hyperlipidemia LDL  - Plan: EKG 12-Lead Goal LDL less than 70 given severe coronary disease, PAD Continue lovastatin  Dizziness Etiology unclear, sometimes associated with neck pain Possibly having episodes of SVT with orthostasis as he has in the past We have offered event monitor, he has declined at this time He prefers to check his heart rate at home when he has a spell  SVT (supraventricular tachycardia) ([place]) - Plan: EKG 12-Lead As detailed above,  long discussion about his SVT If symptoms get worse would recommend event monitor Recommended he monitor heart rate at home closely  Neck pain Concerning for angina possibly in the setting of SVT Stress test has been ordered 3 vessel coronary disease on CT scan  Coronary artery disease involving native coronary artery of native heart without angina pectoris Heavy coronary calcification seen on CT scan We have recommended Myoview He has arthritic issues and will need pharmacologic Myoview  Aortic atherosclerosis (HCC) Moderate diffuse disease on CT scan September 2017 Stressed importance of aggressive cholesterol management   Total encounter time more than 25 minutes  Greater than 50% was spent in counseling and coordination of care with the patient   Disposition:   F/U  6 months   Orders Placed This Encounter  Procedures  . NM Myocar Multi W/Spect W/Wall Motion / EF  . EKG 12-Lead     Signed, Esmond Plants, M.D., Ph.D. 05/05/2017  [place], [place]

## 2017-05-05 ENCOUNTER — Ambulatory Visit (INDEPENDENT_AMBULATORY_CARE_PROVIDER_SITE_OTHER): Payer: Medicare Other | Admitting: Pulmonary Disease

## 2017-05-05 ENCOUNTER — Ambulatory Visit (INDEPENDENT_AMBULATORY_CARE_PROVIDER_SITE_OTHER): Payer: Medicare Other | Admitting: Cardiovascular Disease

## 2017-05-05 ENCOUNTER — Encounter: Payer: Self-pay | Admitting: Cardiovascular Disease

## 2017-05-05 ENCOUNTER — Telehealth: Payer: Self-pay | Admitting: *Deleted

## 2017-05-05 ENCOUNTER — Ambulatory Visit
Admission: RE | Admit: 2017-05-05 | Discharge: 2017-05-05 | Disposition: A | Discharge status: Home / Self Care | Payer: Medicare Other | Source: Ambulatory Visit | Attending: Pulmonary Disease | Admitting: Pulmonary Disease

## 2017-05-05 ENCOUNTER — Inpatient Hospital Stay: Payer: Medicare Other | Admitting: Internal Medicine

## 2017-05-05 ENCOUNTER — Encounter: Payer: Self-pay | Admitting: Pulmonary Disease

## 2017-05-05 VITALS — BP 130/70 | HR 63 | Ht 70.5 in | Wt 206.0 lb

## 2017-05-05 VITALS — BP 130/70 | HR 63 | Ht 70.5 in | Wt 206.5 lb

## 2017-05-05 DIAGNOSIS — E785 Hyperlipidemia, unspecified: Secondary | ICD-10-CM

## 2017-05-05 DIAGNOSIS — J189 Pneumonia, unspecified organism: Secondary | ICD-10-CM | POA: Diagnosis not present

## 2017-05-05 DIAGNOSIS — R42 Dizziness and giddiness: Secondary | ICD-10-CM

## 2017-05-05 DIAGNOSIS — I471 Supraventricular tachycardia: Secondary | ICD-10-CM

## 2017-05-05 DIAGNOSIS — I2510 Atherosclerotic heart disease of native coronary artery without angina pectoris: Secondary | ICD-10-CM | POA: Diagnosis not present

## 2017-05-05 DIAGNOSIS — I10 Essential (primary) hypertension: Secondary | ICD-10-CM

## 2017-05-05 DIAGNOSIS — R042 Hemoptysis: Secondary | ICD-10-CM

## 2017-05-05 DIAGNOSIS — I700 Atherosclerosis of aorta: Secondary | ICD-10-CM

## 2017-05-05 NOTE — Patient Instructions (Addendum)
Please monitor heart rate and blood pressure with dizzy spells  If you have abnormal pulse rate, Call the office We would order a monitor  Medication Instructions:   No medication changes made  Labwork:  No new labs needed  Testing/Procedures:  We will order a lexiscan stress test [place]  Your caregiver has ordered a Stress Test with nuclear imaging. The purpose of this test is to evaluate the blood supply to your heart muscle. This procedure is referred to as a "Non-Invasive Stress Test." This is because other than having an IV started in your vein, nothing is inserted or "invades" your body. Cardiac stress tests are done to find areas of poor blood flow to the heart by determining the extent of coronary artery disease (CAD). Some patients exercise on a treadmill, which naturally increases the blood flow to your heart, while others who are  unable to walk on a treadmill due to physical limitations have a pharmacologic/chemical stress agent called Lexiscan . This medicine will mimic walking on a treadmill by temporarily increasing your coronary blood flow.   Please note: these test may take anywhere between 2-4 hours to complete  PLEASE REPORT TO [facility] AT THE FIRST DESK WILL DIRECT YOU WHERE TO GO  Date of Procedure:___Monday August 27th_____  Arrival Time for Procedure:__Arrive at 07:15AM____   PLEASE NOTIFY THE OFFICE AT LEAST 24 HOURS IN ADVANCE IF YOU ARE UNABLE TO [place].  [phone] AND  PLEASE NOTIFY NUCLEAR MEDICINE AT [facility] AT LEAST 24 HOURS IN ADVANCE IF YOU ARE UNABLE TO KEEP YOUR APPOINTMENT. [phone]  How to prepare for your Myoview test:  1. Do not eat or drink after midnight 2. No caffeine for 24 hours prior to test 3. No smoking 24 hours prior to test. 4. Your medication may be taken with water.  5. Please wear a short sleeve shirt and comfortable clothing. 6. No perfume, cologne or  lotion. 7. Wear comfortable walking shoes. No heels!       Follow-Up: It was a pleasure seeing you in the office today. Please call us if you have new issues that need to be addressed before your next appt.  [phone]  Your physician wants you to follow-up in: 6 months.  You will receive a reminder letter in the mail two months in advance. If you don't receive a letter, please call our office to schedule the follow-up appointment.  If you need a refill on your cardiac medications before your next appointment, please call your pharmacy.

## 2017-05-05 NOTE — Telephone Encounter (Signed)
-----   Message from Wilhelmina Mcardle, MD sent at 05/05/2017  3:20 PM EDT ----- Please let him know that the chest Xray is now entirely normal. There is no reason to do CT chest or any further diagnostic undertaking at this time. Thanks  Merton Border, MD PCCM service Mobile [phone] Pager [phone] 05/05/2017 3:22 PM

## 2017-05-05 NOTE — Patient Instructions (Signed)
Chest x-ray today. We will call you with results  If the chest x-ray has normalized no further evaluation this time  If there is something on the chest x-ray, I will order CT scan of the chest and we will consider bronchoscopy

## 2017-05-05 NOTE — Telephone Encounter (Signed)
Patient's spouse aware of results.

## 2017-05-08 ENCOUNTER — Ambulatory Visit
Admission: RE | Admit: 2017-05-08 | Discharge: 2017-05-08 | Disposition: A | Discharge status: Home / Self Care | Payer: Medicare Other | Source: Ambulatory Visit | Attending: Cardiovascular Disease | Admitting: Cardiovascular Disease

## 2017-05-08 DIAGNOSIS — I10 Essential (primary) hypertension: Secondary | ICD-10-CM | POA: Diagnosis not present

## 2017-05-08 DIAGNOSIS — E785 Hyperlipidemia, unspecified: Secondary | ICD-10-CM | POA: Insufficient documentation

## 2017-05-08 DIAGNOSIS — I471 Supraventricular tachycardia: Secondary | ICD-10-CM | POA: Insufficient documentation

## 2017-05-08 DIAGNOSIS — I700 Atherosclerosis of aorta: Secondary | ICD-10-CM | POA: Diagnosis not present

## 2017-05-08 DIAGNOSIS — I2510 Atherosclerotic heart disease of native coronary artery without angina pectoris: Secondary | ICD-10-CM | POA: Diagnosis not present

## 2017-05-08 LAB — NM MYOCAR MULTI W/SPECT W/WALL MOTION / EF
CHL CUP STRESS STAGE 1 HR: 65 {beats}/min
CHL CUP STRESS STAGE 1 SPEED: 0 mph
CHL CUP STRESS STAGE 2 GRADE: 0 %
CHL CUP STRESS STAGE 2 HR: 65 {beats}/min
CHL CUP STRESS STAGE 3 GRADE: 0 %
CHL CUP STRESS STAGE 3 HR: 67 {beats}/min
CHL CUP STRESS STAGE 4 DBP: 73 mmHg
CHL CUP STRESS STAGE 4 HR: 68 {beats}/min
CHL CUP STRESS STAGE 4 SBP: 143 mmHg
CHL CUP STRESS STAGE 4 SPEED: 0 mph
Estimated workload: 1 METS
LV sys vol: 38 mL
LVDIAVOL: 94 mL (ref 62–150)
NUC STRESS TID: 1.1
Peak HR: 67 {beats}/min
Percent HR: 52 %
Percent of predicted max HR: 48 %
Rest HR: 59 {beats}/min
SDS: 0
SRS: 6
SSS: 1
Stage 1 Grade: 0 %
Stage 2 Speed: 0 mph
Stage 3 Speed: 0 mph
Stage 4 Grade: 0 %

## 2017-05-08 MED ORDER — TECHNETIUM TC 99M TETROFOSMIN IV KIT
13.1710 | PACK | Freq: Once | INTRAVENOUS | Status: AC | PRN
  Administered 2017-05-08: 13.171 via INTRAVENOUS

## 2017-05-08 MED ORDER — TECHNETIUM TC 99M TETROFOSMIN IV KIT
30.2340 | PACK | Freq: Once | INTRAVENOUS | Status: AC | PRN
  Administered 2017-05-08: 30.234 via INTRAVENOUS

## 2017-05-08 MED ORDER — REGADENOSON 0.4 MG/5ML IV SOLN
0.4000 mg | Freq: Once | INTRAVENOUS | Status: AC
  Administered 2017-05-08: 0.4 mg via INTRAVENOUS

## 2017-05-08 NOTE — Progress Notes (Signed)
PULMONARY CONSULT NOTE  Requesting MD/Service: Tanna Furry, MD. [facility] ED Date of initial consultation: 05/05/17 Reason for consultation: Recent PNA with hemoptysis  PT PROFILE: 83 y.o. male never smoker referred for recent pneumonia with hemoptysis and previous similar episodes  HPI:  As above. Presented to [facility] ED 03/19/17 with 12-24 hours of subjective fevers, cough, scant hemoptysis. Had a similar episode in September 2017. He was treated with antibiotics with resolution of these symptoms. At the present time he has no respiratory symptoms. Denies chest pain, fever, cough, purulent sputum, dyspnea, orthopnea, PND, lower extremity edema, calf tenderness.  Past Medical History:  Diagnosis Date  . Arthritis   . Aspiration pneumonia ([place])   . BPH (benign prostatic hypertrophy)   . Chronic back pain   . CKD (chronic kidney disease), stage III   . Colon cancer [facility]) 1988   Surgery alone, no history of chemo  . Diverticulosis   . Essential hypertension   . GERD (gastroesophageal reflux disease)   . H/O calcium pyrophosphate deposition disease (CPPD)   . Hiatal hernia 01/06/2015  . HLD (hyperlipidemia)   . HOH (hard of hearing)    bilateral hearing aids  . Impotence of organic origin   . PSVT (paroxysmal supraventricular tachycardia) ([place])    a. 04/2014 Event Monitor: Freq bouts of SVT with rates to 180-->seen by EP with recommendation for RFCA, pt deferred;  b. 05/2014 Echo: EF nl, mild conc LVH, no rwma, Gr1 DD, mildMR/TR.  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 Rotator cuff rupture    Right; s/p repair  . Unspecified hearing loss   . Ventral hernia 1988    Past Surgical History:  Procedure Laterality Date  . EYE SURGERY Bilateral    cataract extraction with IOL bilaterally  . [place]  . INGUINAL HERNIA REPAIR Right 1963  . MASTECTOMY, PARTIAL Left 02/07/2013   Procedure: LEFT SUBCUTANEOUS MASTECTOMY;  Surgeon: Imogene Burn. Georgette Dover, MD;   Location: [facility];  Service: General;  Laterality: Left;  . ROTATOR CUFF REPAIR Right     [facility]  . ROTATOR CUFF REPAIR Right   . TOTAL KNEE ARTHROPLASTY  03/05/2012   Procedure: TOTAL KNEE ARTHROPLASTY;  Surgeon: Gearlean Alf, MD;  [facility];  Service: Orthopedics;  Laterality: Left;  . [place] Surgery, x 4  . VEIN LIGATION AND STRIPPING Right     MEDICATIONS: I have reviewed all medications and confirmed regimen as documented  Social History   Social History  . Marital status: Married    Spouse name: Festus Holts  . Number of children: 3  . Years of education: college   Occupational History  . Retired Retired   Social History Main Topics  . Smoking status: Never Smoker  . Smokeless tobacco: Never Used  . Alcohol use 1.2 oz/week    2 Glasses of wine per week     Comment: Wine (1/2-1 glass 2x/week)  . Drug use: No  . Sexual activity: Yes   Other Topics Concern  . Not on file   Social History Narrative   Married.  Independent with ADLs/ambulation.         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 Osteoarthritis Father   . Congestive Heart Failure Mother     ROS: No fever, myalgias/arthralgias, unexplained weight loss or weight gain No new focal weakness or sensory deficits No otalgia, hearing loss, visual changes, nasal and sinus symptoms, mouth and throat problems No neck pain or adenopathy No abdominal pain, N/V/D, diarrhea, change in bowel pattern No dysuria, change in urinary pattern   Vitals:   05/05/17 1108  BP: 130/70  Pulse: 63  SpO2: 97%  Weight: 206 lb (93.4 kg)  Height: 5' 10.5" (1.791 m)     EXAM:  Gen: WDWN, No overt respiratory  distress HEENT: NCAT, sclera white, oropharynx normal Neck: Supple without LAN, thyromegaly, JVD Lungs: breath sounds Full, percussion normal, no adventitious sounds Cardiovascular: RRR, no murmurs noted Abdomen: Soft, nontender, normal BS Ext: without clubbing, cyanosis. Symmetric mild ankle edema Neuro: CNs grossly intact, motor and sensory intact Skin: Limited exam, no lesions noted  DATA:   BMP Latest Ref Rng & Units 12/13/2016 07/29/2015 01/05/2015  Glucose 70 - 99 mg/dL 97 77 98  BUN 6 - 23 mg/dL 24(H) 29(H) 21  Creatinine 0.40 - 1.50 mg/dL 1.42 1.61(H) 1.38(H)  Sodium 135 - 145 mEq/L 141 141 137  Potassium 3.5 - 5.1 mEq/L 3.6 4.9 3.7  Chloride 96 - 112 mEq/L 106 101 105  CO2 19 - 32 mEq/L 28 31 27   Calcium 8.4 - 10.5 mg/dL 9.1 9.4 8.2(L)    CBC Latest Ref Rng & Units 12/13/2016 07/29/2015 01/05/2015  WBC 4.0 - 10.5 K/uL 4.6 5.8 7.0  Hemoglobin 13.0 - 17.0 g/dL 13.8 13.3 11.8(L)  Hematocrit 39.0 - 52.0 % 40.3 39.8 36.2(L)  Platelets 150.0 - 400.0 K/uL 213.0 192.0 188    CXR 03/19/17:  Vague left-sided opacity in a similar location to previous study from September 2017.  CT chest 05/23/16: Vague groundglass opacity in left upper lobe  IMPRESSION:     ICD-10-CM   1. Hemoptysis R04.2 DG Chest 2 View  2. Recurrent pneumonia J18.9 DG Chest 2 View   The pulmonary opacities seen on previous films could represent acute infection or simply pooled blood. At this point, bronchoscopy is likely a low yield procedure unless there are persistent abnormalities on chest x-ray.  PLAN:  CXR ordered on the day of this encounter. This reveals no acute cardiac or pulmonary findings.  Follow-up as needed for any recurrence of pneumonia and/or hemoptysis. I discussed this plan in detail with the patient and we have communicated the findings of this chest x-ray to him.  Merton Border, MD PCCM service Mobile [phone] Pager [phone] 05/08/2017 1:25 PM

## 2017-05-22 ENCOUNTER — Other Ambulatory Visit: Payer: Medicare Other

## 2017-06-05 DIAGNOSIS — L570 Actinic keratosis: Secondary | ICD-10-CM | POA: Diagnosis not present

## 2017-06-05 DIAGNOSIS — L905 Scar conditions and fibrosis of skin: Secondary | ICD-10-CM | POA: Diagnosis not present

## 2017-06-05 DIAGNOSIS — Z85828 Personal history of other malignant neoplasm of skin: Secondary | ICD-10-CM | POA: Diagnosis not present

## 2017-06-21 ENCOUNTER — Telehealth: Payer: Self-pay

## 2017-06-21 ENCOUNTER — Ambulatory Visit: Payer: Medicare Other | Admitting: Primary Care

## 2017-06-21 NOTE — Telephone Encounter (Signed)
Pt has appt to see Allie Bossier NP 06/21/17 at 11:45 am. Per Morey Hummingbird at front desk pts only symptom is memory issues.

## 2017-06-21 NOTE — Telephone Encounter (Signed)
PLEASE NOTE: All timestamps contained within this report are represented as [place] Standard Time. CONFIDENTIALTY NOTICE: This fax transmission is intended only for the addressee. It contains information that is legally privileged, confidential or otherwise protected from use or disclosure. If you are not the intended recipient, you are strictly prohibited from reviewing, disclosing, copying using or disseminating any of this information or taking any action in reliance on or regarding this information. If you have received this fax in error, please notify us immediately by telephone so that we can arrange for its return to [place]. Phone: [phone], Toll-Free: [phone], Fax: [phone] Page: 1 of 2 Call Id: [id number] [place] Patient Name: Michael [name] Gender: Male DOB: 1932-06-19 Age: 83 Y 2 M 25 D Return Phone Number: [phone] (Primary), [phone] (Secondary) Address[place] [address] Client [place] Night - Client Client Site [place] Physician [place], Frederico Hamman - MD Contact Type Call Who Is Calling Patient / Member / Family / Caregiver Call Type Triage / Clinical Caller Name Reeves Musick Relationship To Patient Mother Return Phone Number [phone] (Primary) Chief Complaint CONFUSION - new onset Reason for Call Symptomatic / Request for [name] says husband lost his memory for a little while earlier today. His blood pressure was high. Translation No Nurse Assessment Nurse: Brock Bad, RN, Barnetta Chapel Date/Time (Eastern Time): 06/20/2017 8:37:23 PM Confirm and document reason for call. If symptomatic, describe symptoms. ---Caller states that pt had a moment earlier where he "lost his memory". He did have a BP of 169/84 around 6 this evening. Last reading 139/80. The  confusion was around 430 today and lasted about an hour or so. Does the patient have any new or worsening symptoms? ---Yes Will a triage be completed? ---Yes Related visit to physician within the last 2 weeks? ---No Does the PT have any chronic conditions? (i.e. diabetes, asthma, etc.) ---Yes List chronic conditions. ---hypertension, CKD Is this a behavioral health or substance abuse call? ---No Guidelines Guideline Title Affirmed Question Affirmed Notes Nurse Date/Time (Eastern Time) Confusion - Delirium [1] Acting confused (e.g., disoriented, slurred speech) AND [2] brief (now gone) [place], RN, Barnetta Chapel 06/20/2017 8:37:47 PM Disp. Time Eilene Ghazi Time) Disposition Final User 06/20/2017 7:29:10 PM Send To RN Personal Rayder, Lorrin Goodell 06/20/2017 8:38:13 PM See Physician within 4 Hours (or PCP triage) Yes Brock Bad, RN, Mamie Nick NOTE: All timestamps contained within this report are represented as [place] Standard Time. CONFIDENTIALTY NOTICE: This fax transmission is intended only for the addressee. It contains information that is legally privileged, confidential or otherwise protected from use or disclosure. If you are not the intended recipient, you are strictly prohibited from reviewing, disclosing, copying using or disseminating any of this information or taking any action in reliance on or regarding this information. If you have received this fax in error, please notify us immediately by telephone so that we can arrange for its return to [place]. Phone: [phone], Toll-Free: [phone], Fax: [phone] Page: 2 of 2 Call Id: [id number] [place] Disagree/Comply Disagree Caller Understands Yes PreDisposition Did not know what to do Care Advice Given Per Guideline SEE PHYSICIAN WITHIN 4 HOURS (or PCP triage): * IF OFFICE WILL BE CLOSED AND NO PCP TRIAGE: You need to be seen within the next 3 or 4 hours. A nearby Urgent Care Center is often a good source of care. Another choice is to go  to the  ER. Go sooner if you become worse. CALL BACK IF: * You become worse. CARE ADVICE given per Confusion-Delirium (Adult) guideline. Referrals GO TO FACILITY REFUSED

## 2017-06-21 NOTE — Telephone Encounter (Signed)
Spoke with patient's daughter reported that he didn't want to come in after all. Recommended he be seen by PCP or another provider within the week.

## 2017-07-06 DIAGNOSIS — M48062 Spinal stenosis, lumbar region with neurogenic claudication: Secondary | ICD-10-CM | POA: Diagnosis not present

## 2017-07-13 ENCOUNTER — Other Ambulatory Visit: Payer: Self-pay

## 2017-07-13 NOTE — Patient Outreach (Signed)
[facility]) Care Management  07/13/2017  Michael [name] 06-26-1932 [id number]  Medication Adherence call to Michael [name] patient is showing past due under [facility] Ins.on Lovastatin 20 mg spoke to patient he said he is using [facility] now he has been getting his medication through them and is in the process of getting all his medication through the [facility]   [place] Management Direct Dial [phone]  Fax [phone] [email or web address]

## 2017-07-27 ENCOUNTER — Encounter: Payer: Self-pay | Admitting: Internal Medicine

## 2017-07-27 ENCOUNTER — Ambulatory Visit (INDEPENDENT_AMBULATORY_CARE_PROVIDER_SITE_OTHER): Payer: Medicare Other | Admitting: Internal Medicine

## 2017-07-27 ENCOUNTER — Ambulatory Visit: Payer: Self-pay | Admitting: *Deleted

## 2017-07-27 DIAGNOSIS — I4891 Unspecified atrial fibrillation: Secondary | ICD-10-CM | POA: Insufficient documentation

## 2017-07-27 DIAGNOSIS — R042 Hemoptysis: Secondary | ICD-10-CM

## 2017-07-27 DIAGNOSIS — I480 Paroxysmal atrial fibrillation: Secondary | ICD-10-CM

## 2017-07-27 MED ORDER — DOXYCYCLINE HYCLATE 100 MG PO TABS: 100.0000 mg | ORAL_TABLET | Freq: Two times a day (BID) | ORAL | 0 refills | Status: DC

## 2017-07-27 NOTE — Telephone Encounter (Signed)
I spoke with pt and pt does not want to go to UC; no available appts at [facility] or [place]; Dr Alain Marion has appt today at 11:15 and Mrs Buckalew said they are ready to go and can make appt.

## 2017-07-27 NOTE — Progress Notes (Signed)
Subjective:  Patient ID: Michael [name], male    DOB: May 10, 1932  Age: 83 y.o. MRN: [id number]  CC: No chief complaint on file.   HPI Michael [name] presents for c/o cough w/sputum and blood clots in it (small) since yesterday. He had a CAP in July and was coughing up blood too. He is on Eliquis ?dose now - new - started last week per [place] - ?A fib.Marland KitchenMarland KitchenPt states he had a CT of the chest, labs and cardiac ECHO at the [place] last week.  He saw Dr Alva Garnet in 8/18. No h/o PE, DVT, TB, lung Ca. He never smoked. They are traveling to [place] and refused all tests  Outpatient Medications Prior to Visit  Medication Sig Dispense Refill  . aspirin (ASPIRIN EC) 81 MG EC tablet Take 81 mg by mouth daily. Swallow whole.    . diltiazem (CARDIZEM CD) 120 MG 24 hr capsule TAKE ONE CAPSULE BY MOUTH DAILY 90 capsule 3  . flecainide (TAMBOCOR) 100 MG tablet TAKE 1 TABLET BY MOUTH TWICE A DAY 180 tablet 3  . lovastatin (MEVACOR) 20 MG tablet TAKE 1 TABLET BY MOUTH ONCE DAILY 90 tablet 2  . Multiple Vitamins-Minerals (CENTRUM SILVER ADULT 50+ PO) Take 1 tablet by mouth daily.    Marland Kitchen omeprazole (PRILOSEC) 20 MG capsule TAKE 1 CAPSULE BY MOUTH EVERY DAY AS NEEDED. 90 capsule 3  . oxyCODONE-acetaminophen (PERCOCET) 10-325 MG per tablet Take 1 tablet by mouth every 6 (six) hours as needed for pain (pain).     . sildenafil (REVATIO) 20 MG tablet TAKE 2 TO 5 TABLETS BY MOUTH 30 MINUTES PRIOR TO INTERCOURSE AS DIRECTED 50 tablet 2  . traZODone (DESYREL) 50 MG tablet TAKE ONE-HALF TO ONE TABLET BY MOUTH AT BEDTIME AS NEEDED FOR SLEEP 90 tablet 1  . triamcinolone cream (KENALOG) 0.1 % APPLY TO RASH TWICE A DAY FOR 10 DAYS, THEN DAILY FOR 10 DAYS 160 g 0  . vitamin B-12 (CYANOCOBALAMIN) 1000 MCG tablet Take 1,000 mcg by mouth daily.    . vitamin C (ASCORBIC ACID) 500 MG tablet Take 500 mg by mouth daily.     No facility-administered medications prior to visit.     ROS Review of Systems  Constitutional: Negative for activity  change, appetite change, chills, diaphoresis, fatigue, fever and unexpected weight change.  HENT: Negative for congestion, nosebleeds, sneezing, sore throat and trouble swallowing.   Eyes: Negative for itching and visual disturbance.  Respiratory: Positive for cough and wheezing.   Cardiovascular: Negative for chest pain, palpitations and leg swelling.  Gastrointestinal: Negative for abdominal distention, blood in stool, diarrhea and nausea.  Genitourinary: Negative for frequency and hematuria.  Musculoskeletal: Negative for back pain, gait problem, joint swelling and neck pain.  Skin: Negative for rash.  Neurological: Negative for dizziness, tremors, speech difficulty and weakness.  Psychiatric/Behavioral: Negative for agitation, dysphoric mood and sleep disturbance. The patient is not nervous/anxious.     Objective:  BP 130/76 (BP Location: Left Arm, Patient Position: Sitting, Cuff Size: Large)   Pulse 68   Temp 98.1 F (36.7 C) (Oral)   Ht 5\' 10"  (1.778 m)   Wt 207 lb (93.9 kg)   SpO2 98%   BMI 29.70 kg/m   BP Readings from Last 3 Encounters:  07/27/17 130/76  05/05/17 130/70  05/05/17 130/70    Wt Readings from Last 3 Encounters:  07/27/17 207 lb (93.9 kg)  05/05/17 206 lb (93.4 kg)  05/05/17 206 lb 8 oz (93.7 kg)  Physical Exam  Constitutional: He is oriented to person, place, and time. He appears well-developed. No distress.  NAD  HENT:  Mouth/Throat: Oropharynx is clear and moist.  Eyes: Conjunctivae are normal. Pupils are equal, round, and reactive to light.  Neck: Normal range of motion. No JVD present. No thyromegaly present.  Cardiovascular: Normal rate, regular rhythm, normal heart sounds and intact distal pulses. Exam reveals no gallop and no friction rub.  No murmur heard. Pulmonary/Chest: Effort normal and breath sounds normal. No respiratory distress. He has no wheezes. He has no rales. He exhibits no tenderness.  Abdominal: Soft. Bowel sounds are  normal. He exhibits no distension and no mass. There is no tenderness. There is no rebound and no guarding.  Musculoskeletal: Normal range of motion. He exhibits no edema or tenderness.  Lymphadenopathy:    He has no cervical adenopathy.  Neurological: He is alert and oriented to person, place, and time. He has normal reflexes. No cranial nerve deficit. He exhibits normal muscle tone. He displays a negative Romberg sign. Coordination and gait normal.  Skin: Skin is warm and dry. No rash noted.  Psychiatric: He has a normal mood and affect. His behavior is normal. Judgment and thought content normal.    Lab Results  Component Value Date   WBC 4.6 12/13/2016   HGB 13.8 12/13/2016   HCT 40.3 12/13/2016   PLT 213.0 12/13/2016   GLUCOSE 97 12/13/2016   CHOL 143 12/13/2016   TRIG 85.0 12/13/2016   HDL 60.90 12/13/2016   LDLDIRECT 75.4 05/03/2011   LDLCALC 65 12/13/2016   ALT 26 12/13/2016   AST 34 12/13/2016   NA 141 12/13/2016   K 3.6 12/13/2016   CL 106 12/13/2016   CREATININE 1.42 12/13/2016   BUN 24 (H) 12/13/2016   CO2 28 12/13/2016   TSH 1.92 12/13/2016   PSA 2.26 12/14/2016   INR 1.04 01/03/2015   HGBA1C 5.6 05/03/2011    Nm Myocar Multi W/spect W/wall Motion / Ef  Result Date: 05/08/2017  There was no ST segment deviation noted during stress.  No T wave inversion was noted during stress.  Defect 1: There is a small defect of mild severity present in the apex location. This defect is fixed and has normal wall motion and thus most likely is an artifact.  The study is normal.  This is a low risk study.  The left ventricular ejection fraction is normal (55-65%).     Assessment & Plan:   There are no diagnoses linked to this encounter. I am having [name] maintain his oxyCODONE-acetaminophen, omeprazole, Multiple Vitamins-Minerals (CENTRUM SILVER ADULT 50+ PO), vitamin C, vitamin B-12, aspirin, flecainide, diltiazem, triamcinolone cream, sildenafil, traZODone,  and lovastatin.  No orders of the defined types were placed in this encounter.    Follow-up: No Follow-up on file.  Walker Kehr, MD

## 2017-07-27 NOTE — Assessment & Plan Note (Signed)
Per [facility] If you are taking 5 mg Eliquis twice a day, start Eliquis 2.5 mg twice a day

## 2017-07-27 NOTE — Assessment & Plan Note (Addendum)
Recurrent - ?bronchitis vs other Chest CT - Pt states he had a CT of the chest at the [place], refused Labs - refused F/u w/Dr Alva Garnet adviced - pt refused If you are taking 5 mg Eliquis twice a day, start Eliquis 2.5 mg twice a day or stop it Empiric Doxy po. F/u w/Dr Copland in 3 weeks.   Pt states he had a CT of the chest, labs and cardiac ECHO at the [place] last week.  He saw Dr Alva Garnet in 8/18. No h/o PE, DVT, TB, lung Ca. He never smoked. They are traveling to [place] and the pt refused all tests

## 2017-07-27 NOTE — Patient Instructions (Addendum)
If you are taking 5 mg Eliquis twice a day, start Eliquis 2.5 mg twice a day  F/u w/Dr Copland in 3-4 weeks

## 2017-07-27 NOTE — Telephone Encounter (Signed)
Patient is to leave town today- he states he has had this occur before and he is wanting an appointment or an antibiotic. Advised patient can be seen at Urgent Care- but he does not want to do that. Will send message to office for review- patient can be reached at [phone] or [phone]  Reason for Disposition . [1] Coughed up blood AND [2] > 1 tablespoon (15 ml) (Exception: blood-tinged sputum)  Answer Assessment - Initial Assessment Questions 1. ONSET: "When did you start coughing up blood?"     Last night-cough with reflux or coughs phlemb 2. SEVERITY: "How many times?" "How much blood?" (e.g., flecks, streaks, tablespoons, etc)     4-5      Small clots of blood in phlemb 3. COUGHING SPASMS: "Did the blood appear after a coughing spell?"       Patient usually will cough up sputum- not associated with coughing spell 4. RESPIRATORY DISTRESS: "Describe your breathing."      No trouble breathing- patient is concerned that he may be developinh a problem 5. FEVER: "Do you have a fever?" If so, ask: "What is your temperature, how was it measured, and when did it start?"     no 6. SPUTUM: "Describe the color of your sputum" (clear, white, yellow, green), "Has there been any change recently?"     Clear to yellowish- now it is neutral with blood specks floats 7. CARDIAC HISTORY: "Do you have any history of heart disease?" (e.g., heart attack, congestive heart failure)      Rapid heart rate 8. LUNG HISTORY: "Do you have any history of lung disease?"  (e.g., pulmonary embolus, asthma, emphysema)     no 9. PE RISK FACTORS: "Do you have a history of blood clots?" (or: recent major surgery, recent prolonged travel, bedridden )     no 10. OTHER SYMPTOMS: "Do you have any other symptoms?" (e.g., nosebleed, chest pain, abdominal pain, vomiting)       no 11. PREGNANCY: "Is there any chance you are pregnant?" "When was your last menstrual period?"       n/a 12. TRAVEL: "Have you traveled out of the  country in the last month?" (e.g., travel history, exposures)       no  Protocols used: COUGHING UP BLOOD-A-AH

## 2017-12-21 DIAGNOSIS — M5136 Other intervertebral disc degeneration, lumbar region: Secondary | ICD-10-CM | POA: Diagnosis not present

## 2018-03-02 DIAGNOSIS — D485 Neoplasm of uncertain behavior of skin: Secondary | ICD-10-CM | POA: Diagnosis not present

## 2018-03-02 DIAGNOSIS — D692 Other nonthrombocytopenic purpura: Secondary | ICD-10-CM | POA: Diagnosis not present

## 2018-03-02 DIAGNOSIS — L570 Actinic keratosis: Secondary | ICD-10-CM | POA: Diagnosis not present

## 2018-03-02 DIAGNOSIS — L814 Other melanin hyperpigmentation: Secondary | ICD-10-CM | POA: Diagnosis not present

## 2018-03-02 DIAGNOSIS — L821 Other seborrheic keratosis: Secondary | ICD-10-CM | POA: Diagnosis not present

## 2018-03-29 ENCOUNTER — Encounter: Payer: Self-pay | Admitting: Family Medicine

## 2018-03-29 ENCOUNTER — Ambulatory Visit (INDEPENDENT_AMBULATORY_CARE_PROVIDER_SITE_OTHER): Payer: Medicare Other | Admitting: Family Medicine

## 2018-03-29 VITALS — BP 110/66 | HR 58 | Temp 98.7°F | Ht 70.0 in | Wt 207.0 lb

## 2018-03-29 DIAGNOSIS — F5101 Primary insomnia: Secondary | ICD-10-CM

## 2018-03-29 DIAGNOSIS — K1120 Sialoadenitis, unspecified: Secondary | ICD-10-CM

## 2018-03-29 MED ORDER — TRAZODONE HCL 100 MG PO TABS: ORAL_TABLET | ORAL | 5 refills | Status: DC

## 2018-03-29 MED ORDER — AMOXICILLIN-POT CLAVULANATE 875-125 MG PO TABS: 1.0000 | ORAL_TABLET | Freq: Two times a day (BID) | ORAL | 0 refills | Status: AC

## 2018-03-29 NOTE — Patient Instructions (Signed)
Parotid gland infection and inflammation.  Take all antibiotics  Try using some hard candy, ice, etc. To help flush out the parotid gland

## 2018-03-29 NOTE — Progress Notes (Signed)
Dr. Karleen Hampshire T. Kamonte Mcmichen, MD, CAQ [facility] [address] [place], [address] Phone: [phone] Fax: [phone]  03/29/2018  Patient: Michael [name], MRN: [id number], DOB: 08/16/1932, 84 y.o.  Primary Physician:  [name], MD   Chief Complaint  Patient presents with  . lump left side of neck    noticed yesterday   Subjective:   Michael [name] is a 84 y.o. very pleasant male patient who presents with the following:  Pleasant 84 year old gentleman who has a history of colon cancer and is well-known presents with a left-sided somewhat painful lump on the side of his neck at the angle of the jaw.  This is been present for only 1 day.  He does not have any trauma or injury that he can recall.  He is not running a fever.  He has never been a smoker and never been a tobacco user.  He also has been having intermittent problems with insomnia for a long time and he is taking trazodone 50 to 100 mg at night.  Past Medical History, Surgical History, Social History, Family History, Problem List, Medications, and Allergies have been reviewed and updated if relevant.  Patient Active Problem List   Diagnosis Date Noted  . Atrial fibrillation (HCC) 07/27/2017  . Aortic atherosclerosis (HCC) 11/04/2016  . Coronary artery disease involving native coronary artery of native heart without angina pectoris 11/04/2016  . Hypertensive heart disease   . PSVT (paroxysmal supraventricular tachycardia) (HCC)   . Hiatal hernia 01/06/2015  . Aspiration pneumonia (HCC) 01/03/2015  . Hemoptysis 01/03/2015  . SVT (supraventricular tachycardia) (HCC) 04/18/2014  . 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 Impotence of organic origin   . Essential hypertension 01/08/2010  . BENIGN PROSTATIC HYPERTROPHY, WITH URINARY OBSTRUCTION 01/07/2010  . HEARING LOSS, UNSPEC. 12/08/2008  . Hyperlipidemia LDL goal <100 07/23/2008  . GERD 07/23/2008  . CKD (chronic  kidney disease) stage 3, GFR 30-59 ml/min (HCC) 07/23/2008  . Colon cancer (HCC) 07/22/2008    Past Medical History:  Diagnosis Date  . Arthritis   . Aspiration pneumonia (HCC)   . BPH (benign prostatic hypertrophy)   . Chronic back pain   . CKD (chronic kidney disease), stage III (HCC)   . Colon cancer [facility]) 1988   Surgery alone, no history of chemo  . Diverticulosis   . Essential hypertension   . GERD (gastroesophageal reflux disease)   . H/O calcium pyrophosphate deposition disease (CPPD)   . Hiatal hernia 01/06/2015  . HLD (hyperlipidemia)   . HOH (hard of hearing)    bilateral hearing aids  . Impotence of organic origin   . PSVT (paroxysmal supraventricular tachycardia) (HCC)    a. 04/2014 Event Monitor: Freq bouts of SVT with rates to 180-->seen by EP with recommendation for RFCA, pt deferred;  b. 05/2014 Echo: EF nl, mild conc LVH, no rwma, Gr1 DD, mildMR/TR.  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 Rotator cuff rupture    Right; s/p repair  . Unspecified hearing loss   . Ventral hernia 1988    Past Surgical History:  Procedure Laterality Date  . EYE SURGERY Bilateral    cataract extraction with IOL bilaterally  . HEMICOLOECTOMY W/ ANASTOMOSIS  1988   [name]  . INGUINAL HERNIA REPAIR Right 1963  . MASTECTOMY, PARTIAL Left 02/07/2013   Procedure: LEFT SUBCUTANEOUS MASTECTOMY;  Surgeon: Wilmon Arms. Corliss Skains, MD;  Location: [facility];  Service: General;  Laterality: Left;  . ROTATOR CUFF REPAIR Right     [facility]  . ROTATOR CUFF REPAIR Right   . TOTAL KNEE ARTHROPLASTY  03/05/2012   Procedure: TOTAL KNEE ARTHROPLASTY;  Surgeon: Loanne Drilling, MD;  Location: [facility];  Service: Orthopedics;  Laterality: Left;  . UMBILICAL HERNIA REPAIR  [facility], x 4  . VEIN LIGATION AND STRIPPING Right     Social History   Socioeconomic History  . Marital status: Married    Spouse name: [name]  . Number of children: 3  . Years of education:  college  . Highest education level: Not on file  Occupational History  . Occupation: Retired    Associate Professor: Retired  Engineer, production  . Financial resource strain: Not on file  . Food insecurity:    Worry: Not on file    Inability: Not on file  . Transportation needs:    Medical: Not on file    Non-medical: Not on file  Tobacco Use  . Smoking status: Never Smoker  . Smokeless tobacco: Never Used  Substance and Sexual Activity  . Alcohol use: Yes    Alcohol/week: 1.2 oz    Types: 2 Glasses of wine per week    Comment: Wine (1/2-1 glass 2x/week)  . Drug use: No  . Sexual activity: Yes  Lifestyle  . Physical activity:    Days per week: Not on file    Minutes per session: Not on file  . Stress: Not on file  Relationships  . Social connections:    Talks on phone: Not on file    Gets together: Not on file    Attends religious service: Not on file    Active member of club or organization: Not on file    Attends meetings of clubs or organizations: Not on file    Relationship status: Not on file  . Intimate partner violence:    Fear of current or ex partner: Not on file    Emotionally abused: Not on file    Physically abused: Not on file    Forced sexual activity: Not on file  Other Topics Concern  . Not on file  Social History Narrative   Married.  Independent with ADLs/ambulation.         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 Osteoarthritis Father   . Congestive Heart Failure Mother     No Known Allergies  Medication list reviewed and updated in full in [place] [facility].   GEN: No acute illnesses, no fevers, chills. GI: No n/v/d, eating normally Pulm: No SOB Interactive and getting along well at home.  Otherwise, ROS  is as per the HPI.  Objective:   BP 110/66   Pulse (!) 58   Temp 98.7 F (37.1 C) (Oral)   Ht 5\' 10"  (1.778 m)   Wt 207 lb (93.9 kg)   BMI 29.70 kg/m   GEN: WDWN, NAD, Non-toxic, A & O x 3 HEENT: Atraumatic, Normocephalic. Neck supple. No masses, No LAD.  At the left angle of the jaw there is  some enlargement and tenderness at the parotid gland. Ears and Nose: No external deformity. CV: RRR, No M/G/R. No JVD. No thrill. No extra heart sounds. PULM: CTA B, no wheezes, crackles, rhonchi. No retractions. No resp. distress. No accessory muscle use. EXTR: No c/c/e NEURO Normal gait.  PSYCH: Normally interactive. Conversant. Not depressed or anxious appearing.  Calm demeanor.   Laboratory and Imaging Data:  Assessment and Plan:   Parotiditis  Primary insomnia  I would not be overly concerned about this unless it persist greater than a month.  It is likely parotid infection versus inflammation and possibly a stone in the duct.  I am to place him on some Augmentin.  Given his intermittent  significant insomnia, I am going to place the patient on some trazodone and increase the dose.  Follow-up: No follow-ups on file.  Meds ordered this encounter  Medications  . traZODone (DESYREL) 100 MG tablet    Sig: 1 to 2 tablets at night as needed 30 mins before bed    Dispense:  60 tablet    Refill:  5  . amoxicillin-clavulanate (AUGMENTIN) 875-125 MG tablet    Sig: Take 1 tablet by mouth 2 (two) times daily for 7 days.    Dispense:  14 tablet    Refill:  0   Signed,  Tyresa Prindiville T. Mateen Franssen, MD   Allergies as of 03/29/2018   No Known Allergies     Medication List        Accurate as of 03/29/18  6:38 PM. Always use your most recent med list.          amoxicillin-clavulanate 875-125 MG tablet Commonly known as:  AUGMENTIN Take 1 tablet by mouth 2 (two) times daily for 7 days.   aspirin EC 81 MG EC tablet Generic drug:  aspirin Take 81 mg by mouth daily. Swallow whole.     CENTRUM SILVER ADULT 50+ PO Take 1 tablet by mouth daily.   diltiazem 120 MG 24 hr capsule Commonly known as:  CARDIZEM CD TAKE ONE CAPSULE BY MOUTH DAILY   flecainide 100 MG tablet Commonly known as:  TAMBOCOR TAKE 1 TABLET BY MOUTH TWICE A DAY   lovastatin 20 MG tablet Commonly known as:  MEVACOR TAKE 1 TABLET BY MOUTH ONCE DAILY   omeprazole 20 MG capsule Commonly known as:  PRILOSEC TAKE 1 CAPSULE BY MOUTH EVERY DAY AS NEEDED.   oxyCODONE-acetaminophen 10-325 MG tablet Commonly known as:  PERCOCET Take 1 tablet by mouth every 6 (six) hours as needed for pain (pain).   sildenafil 20 MG tablet Commonly known as:  REVATIO TAKE 2 TO 5 TABLETS BY MOUTH 30 MINUTES PRIOR TO INTERCOURSE AS DIRECTED   traZODone 100 MG tablet Commonly known as:  DESYREL 1 to 2 tablets at night as needed 30 mins before bed   triamcinolone cream 0.1 % Commonly known as:  KENALOG Apply 1 application topically as needed.   vitamin B-12 1000 MCG tablet Commonly known as:  CYANOCOBALAMIN Take 1,000 mcg by mouth daily.   vitamin C 500 MG tablet Commonly known as:  ASCORBIC ACID Take 500 mg by mouth daily.

## 2018-04-11 DIAGNOSIS — C44622 Squamous cell carcinoma of skin of right upper limb, including shoulder: Secondary | ICD-10-CM | POA: Diagnosis not present

## 2018-04-13 DIAGNOSIS — L080 Pyoderma: Secondary | ICD-10-CM | POA: Diagnosis not present

## 2018-04-26 ENCOUNTER — Ambulatory Visit (INDEPENDENT_AMBULATORY_CARE_PROVIDER_SITE_OTHER): Payer: Medicare Other

## 2018-04-26 VITALS — BP 122/84 | HR 63 | Temp 97.6°F | Ht 69.0 in | Wt 198.5 lb

## 2018-04-26 DIAGNOSIS — Z125 Encounter for screening for malignant neoplasm of prostate: Secondary | ICD-10-CM | POA: Diagnosis not present

## 2018-04-26 DIAGNOSIS — E559 Vitamin D deficiency, unspecified: Secondary | ICD-10-CM | POA: Diagnosis not present

## 2018-04-26 DIAGNOSIS — I10 Essential (primary) hypertension: Secondary | ICD-10-CM | POA: Diagnosis not present

## 2018-04-26 DIAGNOSIS — E785 Hyperlipidemia, unspecified: Secondary | ICD-10-CM

## 2018-04-26 DIAGNOSIS — E538 Deficiency of other specified B group vitamins: Secondary | ICD-10-CM | POA: Diagnosis not present

## 2018-04-26 DIAGNOSIS — Z0000 Encounter for general adult medical examination without abnormal findings: Secondary | ICD-10-CM

## 2018-04-26 LAB — CBC WITH DIFFERENTIAL/PLATELET
Basophils Absolute: 0 10*3/uL (ref 0.0–0.1)
Basophils Relative: 1 % (ref 0.0–3.0)
EOS ABS: 0.2 10*3/uL (ref 0.0–0.7)
EOS PCT: 3.5 % (ref 0.0–5.0)
HCT: 37.9 % — ABNORMAL LOW (ref 39.0–52.0)
HEMOGLOBIN: 12.8 g/dL — AB (ref 13.0–17.0)
LYMPHS PCT: 19.2 % (ref 12.0–46.0)
Lymphs Abs: 0.8 10*3/uL (ref 0.7–4.0)
MCHC: 33.8 g/dL (ref 30.0–36.0)
MCV: 97.9 fl (ref 78.0–100.0)
Monocytes Absolute: 0.7 10*3/uL (ref 0.1–1.0)
Monocytes Relative: 15.4 % — ABNORMAL HIGH (ref 3.0–12.0)
Neutro Abs: 2.7 10*3/uL (ref 1.4–7.7)
Neutrophils Relative %: 60.9 % (ref 43.0–77.0)
Platelets: 223 10*3/uL (ref 150.0–400.0)
RBC: 3.87 Mil/uL — AB (ref 4.22–5.81)
RDW: 13.5 % (ref 11.5–15.5)
WBC: 4.4 10*3/uL (ref 4.0–10.5)

## 2018-04-26 LAB — LIPID PANEL
Cholesterol: 131 mg/dL (ref 0–200)
HDL: 62.2 mg/dL (ref >39.00)
LDL Cholesterol: 57 mg/dL (ref 0–99)
NONHDL: 68.82
Total CHOL/HDL Ratio: 2
Triglycerides: 60 mg/dL (ref 0.0–149.0)
VLDL: 12 mg/dL (ref 0.0–40.0)

## 2018-04-26 LAB — PSA, MEDICARE: PSA: 2.1 ng/mL (ref 0.10–4.00)

## 2018-04-26 LAB — VITAMIN B12: Vitamin B-12: 711 pg/mL (ref 211–911)

## 2018-04-26 LAB — HEPATIC FUNCTION PANEL
ALK PHOS: 59 U/L (ref 39–117)
ALT: 26 U/L (ref 0–53)
AST: 34 U/L (ref 0–37)
Albumin: 4 g/dL (ref 3.5–5.2)
Bilirubin, Direct: 0.1 mg/dL (ref 0.0–0.3)
TOTAL PROTEIN: 6.9 g/dL (ref 6.0–8.3)
Total Bilirubin: 0.5 mg/dL (ref 0.2–1.2)

## 2018-04-26 LAB — BASIC METABOLIC PANEL
BUN: 42 mg/dL — ABNORMAL HIGH (ref 6–23)
CO2: 35 meq/L — AB (ref 19–32)
Calcium: 9.7 mg/dL (ref 8.4–10.5)
Chloride: 103 mEq/L (ref 96–112)
Creatinine, Ser: 1.93 mg/dL — ABNORMAL HIGH (ref 0.40–1.50)
GFR: 35.42 mL/min — ABNORMAL LOW (ref >60.00)
GLUCOSE: 98 mg/dL (ref 70–99)
POTASSIUM: 4.6 meq/L (ref 3.5–5.1)
SODIUM: 143 meq/L (ref 135–145)

## 2018-04-26 LAB — TSH: TSH: 2.87 u[IU]/mL (ref 0.35–4.50)

## 2018-04-26 LAB — VITAMIN D 25 HYDROXY (VIT D DEFICIENCY, FRACTURES): VITD: 60.66 ng/mL (ref 30.00–100.00)

## 2018-04-26 NOTE — Progress Notes (Signed)
I reviewed health advisor's note, was available for consultation, and agree with documentation and plan.   Signed,  Shontell Prosser T. Philip Kotlyar, MD  

## 2018-04-26 NOTE — Progress Notes (Signed)
Subjective:   Michael [name] is a 84 y.o. male who presents for Medicare Annual/Subsequent preventive examination.  Review of Systems:  N/A Cardiac Risk Factors include: advanced age (>77men, >16 women);dyslipidemia;hypertension;male gender     Objective:    Vitals: BP 122/84 (BP Location: Right Arm, Patient Position: Sitting, Cuff Size: Normal)   Pulse 63   Temp 97.6 F (36.4 C) (Oral)   Ht 5\' 9"  (1.753 m) Comment: no shoes  Wt 198 lb 8 oz (90 kg)   SpO2 98%   BMI 29.31 kg/m   Body mass index is 29.31 kg/m.  Advanced Directives 04/26/2018 03/19/2017 11/10/2015 01/27/2015 01/03/2015 02/05/2013 03/05/2012  Does Patient Have a Medical Advance Directive? No No No No No Patient has advance directive, copy not in chart Patient does not have advance directive;Patient would not like information  Would patient like information on creating a medical advance directive? No - Patient declined No - Patient declined Yes - Educational materials given No - patient declined information - - -  Pre-existing out of facility DNR order (yellow form or pink MOST form) - - - - - - No    Tobacco Social History   Tobacco Use  Smoking Status Never Smoker  Smokeless Tobacco Never Used     Counseling given: No   Clinical Intake:  Pre-visit preparation completed: Yes  Pain : No/denies pain Pain Score: 0-No pain     Nutritional Status: BMI 25 -29 Overweight Nutritional Risks: None Diabetes: No  What is the last grade level you completed in school?: Bachelors degree + some additional college courses  Interpreter Needed?: No  Comments: pt lives with spouse Information entered by :: LPinson, LPN  Past Medical History:  Diagnosis Date  . Arthritis   . Aspiration pneumonia ([place])   . BPH (benign prostatic hypertrophy)   . Chronic back pain   . CKD (chronic kidney disease), stage III ([place])   . Colon cancer [facility]) 1988   Surgery alone, no history of chemo  . Diverticulosis   . Essential  hypertension   . GERD (gastroesophageal reflux disease)   . H/O calcium pyrophosphate deposition disease (CPPD)   . Hiatal hernia 01/06/2015  . HLD (hyperlipidemia)   . HOH (hard of hearing)    bilateral hearing aids  . Impotence of organic origin   . PSVT (paroxysmal supraventricular tachycardia) ([place])    a. 04/2014 Event Monitor: Freq bouts of SVT with rates to 180-->seen by EP with recommendation for RFCA, pt deferred;  b. 05/2014 Echo: EF nl, mild conc LVH, no rwma, Gr1 DD, mildMR/TR.  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 Rotator cuff rupture    Right; s/p repair  . Unspecified hearing loss   . Ventral hernia 1988   Past Surgical History:  Procedure Laterality Date  . EYE SURGERY Bilateral    cataract extraction with IOL bilaterally  . [place]  . INGUINAL HERNIA REPAIR Right 1963  . MASTECTOMY, PARTIAL Left 02/07/2013   Procedure: LEFT SUBCUTANEOUS MASTECTOMY;  Surgeon: Imogene Burn. Georgette Dover, MD;  Location: [facility];  Service: General;  Laterality: Left;  . ROTATOR CUFF REPAIR Right     [facility]  . ROTATOR CUFF REPAIR Right   . TOTAL KNEE ARTHROPLASTY  03/05/2012   Procedure: TOTAL KNEE ARTHROPLASTY;  Surgeon: Gearlean Alf, MD;  Location: [facility];  Service: Orthopedics;  Laterality: Left;  . [place]  Surgery, x 4  . VEIN LIGATION AND STRIPPING Right    Family History  Problem Relation Age of Onset  . Osteoarthritis Father   . Congestive Heart Failure Mother    Social History   Socioeconomic History  . Marital status: Married    Spouse name: Festus Holts  . Number of children: 3  . Years of education: college  . Highest education level: Not on file  Occupational History  . 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 Financial resource strain: Not on file  . Food insecurity:    Worry: Not on file    Inability: Not on file  . Transportation needs:    Medical:  Not on file    Non-medical: Not on file  Tobacco Use  . Smoking status: Never Smoker  . Smokeless tobacco: Never Used  Substance and Sexual Activity  . Alcohol use: Yes    Alcohol/week: 1.0 standard drinks    Types: 1 Glasses of wine per week    Comment: Wine (1/2-1 glass 2x/week)  . Drug use: No  . Sexual activity: Yes  Lifestyle  . Physical activity:    Days per week: Not on file    Minutes per session: Not on file  . Stress: Not on file  Relationships  . Social connections:    Talks on phone: Not on file    Gets together: Not on file    Attends religious service: Not on file    Active member of club or organization: Not on file    Attends meetings of clubs or organizations: Not on file    Relationship status: Not on file  Other Topics Concern  . Not on file  Social History Narrative   Married.  Independent with ADLs/ambulation.         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 apixaban (ELIQUIS) 5 MG TABS tablet Take 5 mg by mouth 2 (two) times daily.  Marland Kitchen diltiazem (CARDIZEM CD) 120 MG 24 hr capsule TAKE ONE CAPSULE BY MOUTH DAILY  . flecainide (TAMBOCOR) 100 MG tablet TAKE 1 TABLET BY MOUTH TWICE A DAY  . gabapentin (NEURONTIN) 100 MG capsule Take 100 mg by mouth at bedtime.  . lovastatin (MEVACOR) 20 MG tablet TAKE 1 TABLET BY MOUTH ONCE DAILY  . Multiple Vitamins-Minerals (CENTRUM SILVER ADULT 50+ PO) Take 1 tablet by mouth daily.  Marland Kitchen omeprazole (PRILOSEC) 20 MG capsule TAKE 1 CAPSULE BY MOUTH EVERY DAY AS NEEDED.  Marland Kitchen oxyCODONE-acetaminophen (PERCOCET) 10-325 MG per tablet Take 1 tablet by mouth every 6 (six) hours as needed for pain (pain).   . sildenafil (REVATIO) 20 MG tablet TAKE 2 TO 5 TABLETS BY MOUTH  30 MINUTES PRIOR TO INTERCOURSE AS DIRECTED  . traZODone (DESYREL) 100 MG tablet 1 to 2 tablets at night as needed 30 mins before bed  . triamcinolone cream (KENALOG) 0.1 % Apply 1 application topically as needed.  . vitamin B-12 (CYANOCOBALAMIN) 1000 MCG tablet Take 1,000 mcg by mouth daily.  . vitamin C (ASCORBIC ACID) 500 MG tablet Take 500 mg  by mouth daily.  . [DISCONTINUED] aspirin (ASPIRIN EC) 81 MG EC tablet Take 81 mg by mouth daily. Swallow whole.   No facility-administered encounter medications on file as of 04/26/2018.     Activities of Daily Living In your present state of health, do you have any difficulty performing the following activities: 04/26/2018  Hearing? Y  Vision? N  Difficulty concentrating or making decisions? Y  Walking or climbing stairs? N  Dressing or bathing? N  Doing errands, shopping? N  Preparing Food and eating ? N  Using the Toilet? N  In the past six months, have you accidently leaked urine? N  Do you have problems with loss of bowel control? N  Managing your Medications? N  Managing your Finances? N  Housekeeping or managing your Housekeeping? N  Some recent data might be hidden    Patient Care Team: Owens Loffler, MD as PCP - General Jodi Marble, MD as Consulting Physician (Otolaryngology) Thelma Comp, OD as Consulting Physician (Optometry)   Assessment:   This is a routine wellness examination for [place].  Hearing Screening Comments: Bilateral hearing aids Vision Screening Comments: Vision exam in 2018 with Dr. Maryruth Hancock B.    Exercise Activities and Dietary recommendations Current Exercise Habits: The patient does not participate in regular exercise at present, Exercise limited by: None identified  Goals    . Patient Stated     Starting 04/26/2018, I will continue to take medications as prescribed.        Fall Risk Fall Risk  04/26/2018 12/19/2016 11/10/2015 06/12/2014  Falls in the past year? No Yes No No  Number falls in  past yr: - 1 - -  Injury with Fall? - No - -   Depression Screen PHQ 2/9 Scores 04/26/2018 12/19/2016 11/10/2015 06/12/2014  PHQ - 2 Score 0 0 0 0  PHQ- 9 Score 0 - - -    Cognitive Function MMSE - Mini Mental State Exam 04/26/2018 11/10/2015  Orientation to time 5 5  Orientation to Place 5 5  Registration 3 3  Attention/ Calculation 0 5  Recall 1 3  Recall-comments unable to recall 2 of 3 words -  Language- name 2 objects 0 -  Language- repeat 1 1  Language- follow 3 step command 2 3  Language- follow 3 step command-comments unable to follow 1 step of 3 step command -  Language- read & follow direction 0 1  Write a sentence 0 -  Copy design 0 -  Total score 17 -       PLEASE NOTE: A Mini-Cog screen was completed. Maximum score is 20. A value of 0 denotes this part of [place] MMSE was not completed or the patient failed this part of the Mini-Cog screening.   Mini-Cog Screening Orientation to Time - Max 5 pts Orientation to Place - Max 5 pts Registration - Max 3 pts Recall - Max 3 pts Language Repeat - Max 1 pts Language Follow 3 Step Command - Max 3 pts   Immunization History  Administered Date(s) Administered  . Influenza,inj,Quad PF,6+ Mos 07/29/2015, 07/18/2016  . Pneumococcal Conjugate-13 12/22/2014  . Pneumococcal Polysaccharide-23 02/25/2013    Screening Tests Health Maintenance  Topic Date Due  . INFLUENZA VACCINE  12/11/2018 (Originally 04/12/2018)  . TETANUS/TDAP  04/27/2019 (Originally 03/26/1951)  . PNA vac Low Risk Adult  Completed      Plan:     I have personally reviewed, addressed, and noted the following in the patient's chart:  A. Medical  and social history B. Use of alcohol, tobacco or illicit drugs  C. Current medications and supplements D. Functional ability and status E.  Nutritional status F.  Physical activity G. Advance directives H. List of other physicians I.  Hospitalizations, surgeries, and ER visits in previous 12 months J.   [place] to include hearing, vision, cognitive, depression L. Referrals and appointments - none  In addition, I have reviewed and discussed with patient certain preventive protocols, quality metrics, and best practice recommendations. A written personalized care plan for preventive services as well as general preventive health recommendations were provided to patient.  See attached scanned questionnaire for additional information.   Signed,   Lindell Noe, MHA, BS, LPN Health Coach

## 2018-04-26 NOTE — Patient Instructions (Signed)
Mr. Cumba , Thank you for taking time to come for your Medicare Wellness Visit. I appreciate your ongoing commitment to your health goals. Please review the following plan we discussed and let me know if I can assist you in the future.   These are the goals we discussed: Goals    . Patient Stated     Starting 04/26/2018, I will continue to take medications as prescribed.        This is a list of the screening recommended for you and due dates:  Health Maintenance  Topic Date Due  . Flu Shot  12/11/2018*  . Tetanus Vaccine  04/27/2019*  . Pneumonia vaccines  Completed  *Topic was postponed. The date shown is not the original due date.   Preventive Care for Adults  A healthy lifestyle and preventive care can promote health and wellness. Preventive health guidelines for adults include the following key practices.  . A routine yearly physical is a good way to check with your health care provider about your health and preventive screening. It is a chance to share any concerns and updates on your health and to receive a thorough exam.  . Visit your dentist for a routine exam and preventive care every 6 months. Brush your teeth twice a day and floss once a day. Good oral hygiene prevents tooth decay and gum disease.  . The frequency of eye exams is based on your age, health, family medical history, use  of contact lenses, and other factors. Follow your health care provider's recommendations for frequency of eye exams.  . Eat a healthy diet. Foods like vegetables, fruits, whole grains, low-fat dairy products, and lean protein foods contain the nutrients you need without too many calories. Decrease your intake of foods high in solid fats, added sugars, and salt. Eat the right amount of calories for you. Get information about a proper diet from your health care provider, if necessary.  . Regular physical exercise is one of the most important things you can do for your health. Most adults should  get at least 150 minutes of moderate-intensity exercise (any activity that increases your heart rate and causes you to sweat) each week. In addition, most adults need muscle-strengthening exercises on 2 or more days a week.  Silver Sneakers may be a benefit available to you. To determine eligibility, you may visit the website: www.silversneakers.com or contact program at 848-401-9776 Mon-Fri between 8AM-8PM.   . Maintain a healthy weight. The body mass index (BMI) is a screening tool to identify possible weight problems. It provides an estimate of body fat based on height and weight. Your health care provider can find your BMI and can help you achieve or maintain a healthy weight.   For adults 20 years and older: ? A BMI below 18.5 is considered underweight. ? A BMI of 18.5 to 24.9 is normal. ? A BMI of 25 to 29.9 is considered overweight. ? A BMI of 30 and above is considered obese.   . Maintain normal blood lipids and cholesterol levels by exercising and minimizing your intake of saturated fat. Eat a balanced diet with plenty of fruit and vegetables. Blood tests for lipids and cholesterol should begin at age 64 and be repeated every 5 years. If your lipid or cholesterol levels are high, you are over 50, or you are at high risk for heart disease, you may need your cholesterol levels checked more frequently. Ongoing high lipid and cholesterol levels should be treated with medicines  if diet and exercise are not working.  . If you smoke, find out from your health care provider how to quit. If you do not use tobacco, please do not start.  . If you choose to drink alcohol, please do not consume more than 2 drinks per day. One drink is considered to be 12 ounces (355 mL) of beer, 5 ounces (148 mL) of wine, or 1.5 ounces (44 mL) of liquor.  . If you are 82-82 years old, ask your health care provider if you should take aspirin to prevent strokes.  . Use sunscreen. Apply sunscreen liberally and  repeatedly throughout the day. You should seek shade when your shadow is shorter than you. Protect yourself by wearing long sleeves, pants, a wide-brimmed hat, and sunglasses year round, whenever you are outdoors.  . Once a month, do a whole body skin exam, using a mirror to look at the skin on your back. Tell your health care provider of new moles, moles that have irregular borders, moles that are larger than a pencil eraser, or moles that have changed in shape or color.

## 2018-04-26 NOTE — Progress Notes (Signed)
PCP notes:   Health maintenance:  Flu vaccine - addressed  Abnormal screenings:   Mini-cog score: 17/20 MMSE - Mini Mental State Exam 04/26/2018 11/10/2015  Orientation to time 5 5  Orientation to Place 5 5  Registration 3 3  Attention/ Calculation 0 5  Recall 1 3  Recall-comments unable to recall 2 of 3 words -  Language- name 2 objects 0 -  Language- repeat 1 1  Language- follow 3 step command 2 3  Language- follow 3 step command-comments unable to follow 1 step of 3 step command -  Language- read & follow direction 0 1  Write a sentence 0 -  Copy design 0 -  Total score 17 -    Patient concerns:   Patient wants to discussed libido with PCP at next appt.  Nurse concerns:  None  Next PCP appt:   05/02/18 @ 1120

## 2018-05-01 ENCOUNTER — Encounter: Payer: Self-pay | Admitting: *Deleted

## 2018-05-01 NOTE — Progress Notes (Signed)
Dr. Karleen Hampshire T. Japji Kok, MD, CAQ [facility] [address] [place], [address] Phone: [phone] Fax: [phone]  05/02/2018  Patient: Michael [name], MRN: [id number], DOB: August 25, 1932, 84 y.o.  Primary Physician:  [name], MD   Chief Complaint  Patient presents with  . Annual Exam    Part 2   Subjective:   [name] is a 84 y.o. pleasant patient who presents with the following:  Preventative Health Maintenance Visit:  Health Maintenance Summary Reviewed and updated, unless pt declines services.  Tobacco History Reviewed. Alcohol: No concerns, no excessive use Exercise Habits: Some activity, rec at least 30 mins 5 times a week STD concerns: no risk or activity to increase risk Drug Use: None Encouraged self-testicular check  Cr has increased to 1.9 from baseline of 1.6  His hearing is poor Memory is worsening  Health Maintenance  Topic Date Due  . INFLUENZA VACCINE  12/11/2018 (Originally 04/12/2018)  . TETANUS/TDAP  04/27/2019 (Originally 03/26/1951)  . PNA vac Low Risk Adult  Completed   Immunization History  Administered Date(s) Administered  . Influenza,inj,Quad PF,6+ Mos 07/29/2015, 07/18/2016  . Pneumococcal Conjugate-13 12/22/2014  . Pneumococcal Polysaccharide-23 02/25/2013   Patient Active Problem List   Diagnosis Date Noted  . Atrial fibrillation (HCC) 07/27/2017  . Aortic atherosclerosis (HCC) 11/04/2016  . Coronary artery disease involving native coronary artery of native heart without angina pectoris 11/04/2016  . Hypertensive heart disease   . PSVT (paroxysmal supraventricular tachycardia) (HCC)   . Hiatal hernia 01/06/2015  . Hemoptysis 01/03/2015  . SVT (supraventricular tachycardia) (HCC) 04/18/2014  . 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 Impotence of organic origin   . Essential hypertension 01/08/2010  . BENIGN PROSTATIC HYPERTROPHY, WITH URINARY OBSTRUCTION 01/07/2010  .  HEARING LOSS, UNSPEC. 12/08/2008  . Hyperlipidemia LDL goal <100 07/23/2008  . GERD 07/23/2008  . CKD (chronic kidney disease) stage 3, GFR 30-59 ml/min (HCC) 07/23/2008  . Colon cancer (HCC) 07/22/2008   Past Medical History:  Diagnosis Date  . Arthritis   . Aspiration pneumonia (HCC)   . BPH (benign prostatic hypertrophy)   . Chronic back pain   . CKD (chronic kidney disease), stage III (HCC)   . Colon cancer [facility]) 1988   Surgery alone, no history of chemo  . Diverticulosis   . Essential hypertension   . GERD (gastroesophageal reflux disease)   . H/O calcium pyrophosphate deposition disease (CPPD)   . Hiatal hernia 01/06/2015  . HLD (hyperlipidemia)   . HOH (hard of hearing)    bilateral hearing aids  . Impotence of organic origin   . PSVT (paroxysmal supraventricular tachycardia) (HCC)    a. 04/2014 Event Monitor: Freq bouts of SVT with rates to 180-->seen by EP with recommendation for RFCA, pt deferred;  b. 05/2014 Echo: EF nl, mild conc LVH, no rwma, Gr1 DD, mildMR/TR.  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 Rotator cuff rupture    Right; s/p repair  . Unspecified hearing loss   . Ventral hernia 1988   Past Surgical History:  Procedure Laterality Date  . EYE SURGERY Bilateral    cataract extraction with IOL bilaterally  . HEMICOLOECTOMY W/ ANASTOMOSIS  1988   [name]  . INGUINAL HERNIA REPAIR Right 1963  . MASTECTOMY, PARTIAL Left 02/07/2013   Procedure: LEFT SUBCUTANEOUS MASTECTOMY;  Surgeon: Wilmon Arms. Corliss Skains, MD;  Location: [facility];  Service: General;  Laterality: Left;  . ROTATOR CUFF  REPAIR Right     [facility]  . ROTATOR CUFF REPAIR Right   . TOTAL KNEE ARTHROPLASTY  03/05/2012   Procedure: TOTAL KNEE ARTHROPLASTY;  Surgeon: Loanne Drilling, MD;  Location: [facility];  Service: Orthopedics;  Laterality: Left;  . UMBILICAL HERNIA REPAIR  [facility], x 4  . VEIN LIGATION AND STRIPPING Right    Social History   Socioeconomic  History  . Marital status: Married    Spouse name: [name]  . Number of children: 3  . Years of education: college  . Highest education level: Not on file  Occupational History  . Occupation: Retired    Associate Professor: Retired  Engineer, production  . Financial resource strain: Not on file  . Food insecurity:    Worry: Not on file    Inability: Not on file  . Transportation needs:    Medical: Not on file    Non-medical: Not on file  Tobacco Use  . Smoking status: Never Smoker  . Smokeless tobacco: Never Used  Substance and Sexual Activity  . Alcohol use: Yes    Alcohol/week: 1.0 standard drinks    Types: 1 Glasses of wine per week    Comment: Wine (1/2-1 glass 2x/week)  . Drug use: No  . Sexual activity: Yes  Lifestyle  . Physical activity:    Days per week: Not on file    Minutes per session: Not on file  . Stress: Not on file  Relationships  . Social connections:    Talks on phone: Not on file    Gets together: Not on file    Attends religious service: Not on file    Active member of club or organization: Not on file    Attends meetings of clubs or organizations: Not on file    Relationship status: Not on file  . Intimate partner violence:    Fear of current or ex partner: Not on file    Emotionally abused: Not on file    Physically abused: Not on file    Forced sexual activity: Not on file  Other Topics Concern  . Not on file  Social History Narrative   Married.  Independent with ADLs/ambulation.   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 Osteoarthritis Father   . Congestive Heart Failure Mother    No Known Allergies  Medication list has been reviewed and updated.   General: Denies fever, chills, sweats. No significant weight loss. Eyes: Denies  blurring,significant itching ENT: Denies earache, sore throat, and hoarseness. Cardiovascular: Denies chest pains, palpitations, dyspnea on exertion Respiratory: Denies cough, dyspnea at rest,wheeezing Breast: no concerns about lumps GI: Denies nausea, vomiting, diarrhea, constipation, change in bowel habits, abdominal pain, melena, hematochezia GU: Denies penile discharge, ED, urinary flow / outflow problems. No STD concerns. Musculoskeletal: Denies back pain, joint pain Derm: Denies rash, itching Neuro: Denies  paresthesias, frequent falls, frequent headaches Psych: Denies depression, anxiety Endocrine: Denies cold intolerance, heat intolerance, polydipsia Heme: Denies enlarged lymph nodes Allergy: No hayfever  Objective:   BP 122/70   Pulse 80   Temp 98.3 F (36.8 C) (Oral)   Ht 5\' 9"  (1.753 m)   Wt 202 lb 12 oz (92 kg)  BMI 29.94 kg/m  Ideal Body Weight: Weight in (lb) to have BMI = 25: 168.9  No exam data present  GEN: well developed, well nourished, no acute distress Eyes: conjunctiva and lids normal, PERRLA, EOMI ENT: TM clear, nares clear, oral exam WNL Neck: supple, no lymphadenopathy, no thyromegaly, no JVD Pulm: clear to auscultation and percussion, respiratory effort normal CV: regular rate and rhythm, S1-S2, no murmur, rub or gallop, no bruits, peripheral pulses normal and symmetric, no cyanosis, clubbing, edema or varicosities GI: soft, non-tender; no hepatosplenomegaly, masses; active bowel sounds all quadrants GU: no hernia, testicular mass, penile discharge Lymph: no cervical, axillary or inguinal adenopathy MSK: gait normal, muscle tone and strength WNL, no joint swelling, effusions, discoloration, crepitus  SKIN: clear, good turgor, color WNL, no rashes, lesions, or ulcerations Neuro: normal mental status, normal strength, sensation, and motion Psych: alert; oriented to person, place and time, normally interactive and not anxious or depressed in  appearance. All labs reviewed with patient.  Lipids:    Component Value Date/Time   CHOL 131 04/26/2018 1022   TRIG 60.0 04/26/2018 1022   HDL 62.20 04/26/2018 1022   LDLDIRECT 75.4 05/03/2011 1603   VLDL 12.0 04/26/2018 1022   CHOLHDL 2 04/26/2018 1022   CBC: CBC Latest Ref Rng & Units 04/26/2018 12/13/2016 07/29/2015  WBC 4.0 - 10.5 K/uL 4.4 4.6 5.8  Hemoglobin 13.0 - 17.0 g/dL 12.8(L) 13.8 13.3  Hematocrit 39.0 - 52.0 % 37.9(L) 40.3 39.8  Platelets 150.0 - 400.0 K/uL 223.0 213.0 192.0    Basic Metabolic Panel:    Component Value Date/Time   NA 143 04/26/2018 1022   K 4.6 04/26/2018 1022   CL 103 04/26/2018 1022   CO2 35 (H) 04/26/2018 1022   BUN 42 (H) 04/26/2018 1022   CREATININE 1.93 (H) 04/26/2018 1022   GLUCOSE 98 04/26/2018 1022   CALCIUM 9.7 04/26/2018 1022   Hepatic Function Latest Ref Rng & Units 04/26/2018 12/13/2016 07/29/2015  Total Protein 6.0 - 8.3 g/dL 6.9 6.7 6.6  Albumin 3.5 - 5.2 g/dL 4.0 4.0 4.1  AST 0 - 37 U/L 34 34 35  ALT 0 - 53 U/L 26 26 30   Alk Phosphatase 39 - 117 U/L 59 56 59  Total Bilirubin 0.2 - 1.2 mg/dL 0.5 0.6 0.4  Bilirubin, Direct 0.0 - 0.3 mg/dL 0.1 0.1 0.1    Lab Results  Component Value Date   TSH 2.87 04/26/2018   Lab Results  Component Value Date   PSA 2.10 04/26/2018   PSA 2.26 12/14/2016   PSA 1.11 06/09/2014    Assessment and Plan:   Healthcare maintenance  CKD (chronic kidney disease) stage 3, GFR 30-59 ml/min (HCC) - Plan: Basic metabolic panel   CKD worsening - recheck BMP. If returns to baseline likely prerenal effect. If stays up, Nephrology involvement would be a good idea.   Health Maintenance Exam: The patient's preventative maintenance and recommended screening tests for an annual wellness exam were reviewed in full today. Brought up to date unless services declined.  Counselled on the importance of diet, exercise, and its role in overall health and mortality. The patient's FH and SH was reviewed,  including their home life, tobacco status, and drug and alcohol status.  Follow-up in 1 year for physical exam or additional follow-up below.  Follow-up: Return in about 2 weeks (around 05/16/2018) for blood work to recheck kidneys. Or follow-up in 1 year if not noted.  Signed,  Elpidio Galea. Odyn Turko, MD   Allergies  as of 05/02/2018   No Known Allergies     Medication List        Accurate as of 05/02/18  1:20 PM. Always use your most recent med list.          apixaban 5 MG Tabs tablet Commonly known as:  ELIQUIS Take 5 mg by mouth 2 (two) times daily.   CENTRUM SILVER ADULT 50+ PO Take 1 tablet by mouth daily.   diltiazem 120 MG 24 hr capsule Commonly known as:  CARDIZEM CD TAKE ONE CAPSULE BY MOUTH DAILY   flecainide 100 MG tablet Commonly known as:  TAMBOCOR TAKE 1 TABLET BY MOUTH TWICE A DAY   gabapentin 100 MG capsule Commonly known as:  NEURONTIN Take 100 mg by mouth at bedtime.   lovastatin 20 MG tablet Commonly known as:  MEVACOR TAKE 1 TABLET BY MOUTH ONCE DAILY   mupirocin ointment 2 % Commonly known as:  BACTROBAN APPLY TO AFFECTED AREA 3 TIMES A DAY   omeprazole 20 MG capsule Commonly known as:  PRILOSEC TAKE 1 CAPSULE BY MOUTH EVERY DAY AS NEEDED.   sildenafil 20 MG tablet Commonly known as:  REVATIO TAKE 2 TO 5 TABLETS BY MOUTH 30 MINUTES PRIOR TO INTERCOURSE AS DIRECTED   traZODone 100 MG tablet Commonly known as:  DESYREL 1 to 2 tablets at night as needed 30 mins before bed   triamcinolone cream 0.1 % Commonly known as:  KENALOG Apply 1 application topically as needed.   vitamin B-12 1000 MCG tablet Commonly known as:  CYANOCOBALAMIN Take 1,000 mcg by mouth daily.   vitamin C 500 MG tablet Commonly known as:  ASCORBIC ACID Take 500 mg by mouth daily.

## 2018-05-02 ENCOUNTER — Ambulatory Visit: Payer: Medicare Other | Admitting: Family Medicine

## 2018-05-02 ENCOUNTER — Encounter: Payer: Self-pay | Admitting: Family Medicine

## 2018-05-02 VITALS — BP 122/70 | HR 80 | Temp 98.3°F | Ht 69.0 in | Wt 202.8 lb

## 2018-05-02 DIAGNOSIS — N183 Chronic kidney disease, stage 3 unspecified: Secondary | ICD-10-CM

## 2018-05-02 DIAGNOSIS — Z0000 Encounter for general adult medical examination without abnormal findings: Secondary | ICD-10-CM

## 2018-05-16 ENCOUNTER — Other Ambulatory Visit (INDEPENDENT_AMBULATORY_CARE_PROVIDER_SITE_OTHER): Payer: Medicare Other

## 2018-05-16 DIAGNOSIS — N183 Chronic kidney disease, stage 3 unspecified: Secondary | ICD-10-CM

## 2018-05-16 LAB — BASIC METABOLIC PANEL
BUN: 35 mg/dL — ABNORMAL HIGH (ref 6–23)
CHLORIDE: 100 meq/L (ref 96–112)
CO2: 31 meq/L (ref 19–32)
CREATININE: 1.69 mg/dL — AB (ref 0.40–1.50)
Calcium: 9 mg/dL (ref 8.4–10.5)
GFR: 41.28 mL/min — ABNORMAL LOW (ref >60.00)
Glucose, Bld: 92 mg/dL (ref 70–99)
Potassium: 3.9 mEq/L (ref 3.5–5.1)
Sodium: 138 mEq/L (ref 135–145)

## 2018-05-25 DIAGNOSIS — Z9842 Cataract extraction status, left eye: Secondary | ICD-10-CM | POA: Diagnosis not present

## 2018-05-25 DIAGNOSIS — H524 Presbyopia: Secondary | ICD-10-CM | POA: Diagnosis not present

## 2018-05-25 DIAGNOSIS — H0288B Meibomian gland dysfunction left eye, upper and lower eyelids: Secondary | ICD-10-CM | POA: Diagnosis not present

## 2018-05-25 DIAGNOSIS — H0288A Meibomian gland dysfunction right eye, upper and lower eyelids: Secondary | ICD-10-CM | POA: Diagnosis not present

## 2018-05-25 DIAGNOSIS — Z9841 Cataract extraction status, right eye: Secondary | ICD-10-CM | POA: Diagnosis not present

## 2018-09-12 DIAGNOSIS — J690 Pneumonitis due to inhalation of food and vomit: Secondary | ICD-10-CM

## 2018-09-12 HISTORY — DX: Pneumonitis due to inhalation of food and vomit: J69.0

## 2018-10-11 DIAGNOSIS — M5136 Other intervertebral disc degeneration, lumbar region: Secondary | ICD-10-CM | POA: Diagnosis not present

## 2018-10-15 ENCOUNTER — Ambulatory Visit: Payer: Self-pay | Admitting: *Deleted

## 2018-10-15 NOTE — Telephone Encounter (Signed)
Patient's wife is calling regarding patient. Patient has developed a cough with wheezing at night. Wife states she saw blood once in sputum.( it was in the toilet- she is sure he spit it out.)  Call to office- patient should be seen within 4 hours- advised ED/UC- wife will try to get husband to go. If he refuses- she will call back. Wife is answering questions- husband is resting. Reason for Disposition . [1] Coughed up blood AND [2] > 1 tablespoon (15 ml) (Exception: blood-tinged sputum)  Answer Assessment - Initial Assessment Questions 1. ONSET: "When did the cough begin?"      Last night 2. SEVERITY: "How bad is the cough today?"      Not bad cough- sore throat- patient has been sleeping 3. RESPIRATORY DISTRESS: "Describe your breathing."      No- last night he was making funny sound- wheezing 4. FEVER: "Do you have a fever?" If so, ask: "What is your temperature, how was it measured, and when did it start?"     No fever 5. SPUTUM: "Describe the color of your sputum" (clear, white, yellow, green)     Not sure 6. HEMOPTYSIS: "Are you coughing up any blood?" If so ask: "How much?" (flecks, streaks, tablespoons, etc.)     Wife has seen blood- tablespoon 7. CARDIAC HISTORY: "Do you have any history of heart disease?" (e.g., heart attack, congestive heart failure)      Heart disease 8. LUNG HISTORY: "Do you have any history of lung disease?"  (e.g., pulmonary embolus, asthma, emphysema)     no 9. PE RISK FACTORS: "Do you have a history of blood clots?" (or: recent major surgery, recent prolonged travel, bedridden)     no 10. OTHER SYMPTOMS: "Do you have any other symptoms?" (e.g., runny nose, wheezing, chest pain)       Cough, sore throat 11. PREGNANCY: "Is there any chance you are pregnant?" "When was your last menstrual period?"       n/a 12. TRAVEL: "Have you traveled out of the country in the last month?" (e.g., travel history, exposures)       No travel  Protocols used: [place]

## 2018-10-16 ENCOUNTER — Ambulatory Visit (INDEPENDENT_AMBULATORY_CARE_PROVIDER_SITE_OTHER): Payer: Medicare Other | Admitting: Family Medicine

## 2018-10-16 ENCOUNTER — Ambulatory Visit (INDEPENDENT_AMBULATORY_CARE_PROVIDER_SITE_OTHER)
Admission: RE | Admit: 2018-10-16 | Discharge: 2018-10-16 | Disposition: A | Discharge status: Home / Self Care | Payer: Medicare Other | Source: Ambulatory Visit | Attending: Family Medicine | Admitting: Family Medicine

## 2018-10-16 ENCOUNTER — Encounter: Payer: Self-pay | Admitting: Family Medicine

## 2018-10-16 VITALS — BP 136/80 | HR 70 | Temp 98.4°F | Ht 69.0 in | Wt 209.5 lb

## 2018-10-16 DIAGNOSIS — K219 Gastro-esophageal reflux disease without esophagitis: Secondary | ICD-10-CM | POA: Insufficient documentation

## 2018-10-16 DIAGNOSIS — R042 Hemoptysis: Secondary | ICD-10-CM

## 2018-10-16 DIAGNOSIS — R12 Heartburn: Secondary | ICD-10-CM

## 2018-10-16 NOTE — Assessment & Plan Note (Signed)
He has omeprazole at home but regularly takes tums. Advised limiting tums use, rec start omeprazole daily 1-2 wk course, could use OTC pepcid as well.

## 2018-10-16 NOTE — Patient Instructions (Addendum)
Xray today.  I think symptoms may have come from burst capillary after coughing. eliquis increases bleeding risk. Take cough drop or honey with lemon to soothe throat.   For heartburn: Take omeprazole for 1-2 week.  Head of bed elevated. Avoide of citrus, fatty foods, chocolate, peppermint, and excessive alcohol, along with sodas, orange juice (acidic drinks) At least a few hours between dinner and bed, minimize naps after eating.

## 2018-10-16 NOTE — Progress Notes (Addendum)
BP 136/80 (BP Location: Left Arm, Patient Position: Sitting, Cuff Size: Normal)   Pulse 70   Temp 98.4 F (36.9 C) (Oral)   Ht 5\' 9"  (1.753 m)   Wt 209 lb 8 oz (95 kg)   SpO2 97%   BMI 30.94 kg/m    CC: coughing up blood Subjective:    Patient ID: Michael [name], male    DOB: 02/04/1932, 85 y.o.   MRN: [id number]  HPI: Michael [name] is a 85 y.o. male presenting on 10/16/2018 for Cough (C/o coughing up bloody mucous. Started 2 days ago.   Pt acccompanied by his wife, Festus Holts. )   2d h/o coughing up mucous with small amt blood mixed with mucous - today some better. No significant cough otherwise. No other symptoms - no fevers, congestion, headache, ST, abd pain, nausea/vomting, diarrhea or constipation, urinary symptoms.   Sunday night had malaise with chills, but no fever or body aches. Had some wheezing that night but not since.   Has coughed up blood several times in the past with dx PNA, last 2019.  Acid reflux worsened after pizza after Super Bowl.  New pimple to right nare.   Known afib on full dose eliquis.  Weight gain noted.     Relevant past medical, surgical, family and social history reviewed and updated as indicated. Interim medical history since our last visit reviewed. Allergies and medications reviewed and updated. Outpatient Medications Prior to Visit  Medication Sig Dispense Refill  . apixaban (ELIQUIS) 5 MG TABS tablet Take 5 mg by mouth 2 (two) times daily.    . Calcium Carb-Cholecalciferol (CALCIUM+D3 PO) Take 1 tablet by mouth daily.    Marland Kitchen diltiazem (CARDIZEM CD) 120 MG 24 hr capsule TAKE ONE CAPSULE BY MOUTH DAILY 90 capsule 3  . flecainide (TAMBOCOR) 100 MG tablet TAKE 1 TABLET BY MOUTH TWICE A DAY 180 tablet 3  . gabapentin (NEURONTIN) 100 MG capsule Take 100 mg by mouth at bedtime.    . lovastatin (MEVACOR) 20 MG tablet TAKE 1 TABLET BY MOUTH ONCE DAILY 90 tablet 2  . Multiple Vitamins-Minerals (CENTRUM SILVER ADULT 50+ PO) Take 1 tablet by mouth daily.     . mupirocin ointment (BACTROBAN) 2 % APPLY TO AFFECTED AREA 3 TIMES A DAY  1  . omeprazole (PRILOSEC) 20 MG capsule TAKE 1 CAPSULE BY MOUTH EVERY DAY AS NEEDED. 90 capsule 3  . sildenafil (REVATIO) 20 MG tablet TAKE 2 TO 5 TABLETS BY MOUTH 30 MINUTES PRIOR TO INTERCOURSE AS DIRECTED 50 tablet 2  . traZODone (DESYREL) 100 MG tablet 1 to 2 tablets at night as needed 30 mins before bed 60 tablet 5  . triamcinolone cream (KENALOG) 0.1 % Apply 1 application topically as needed.    . vitamin B-12 (CYANOCOBALAMIN) 1000 MCG tablet Take 1,000 mcg by mouth daily.    . vitamin C (ASCORBIC ACID) 500 MG tablet Take 500 mg by mouth daily.     No facility-administered medications prior to visit.      Per HPI unless specifically indicated in ROS section below Review of Systems Objective:    BP 136/80 (BP Location: Left Arm, Patient Position: Sitting, Cuff Size: Normal)   Pulse 70   Temp 98.4 F (36.9 C) (Oral)   Ht 5\' 9"  (1.753 m)   Wt 209 lb 8 oz (95 kg)   SpO2 97%   BMI 30.94 kg/m   Wt Readings from Last 3 Encounters:  10/16/18 209 lb 8 oz (95  kg)  05/02/18 202 lb 12 oz (92 kg)  04/26/18 198 lb 8 oz (90 kg)    Physical Exam Vitals signs and nursing note reviewed.  Constitutional:      General: He is not in acute distress.    Appearance: Normal appearance. He is well-developed.  HENT:     Head: Normocephalic and atraumatic.     Right Ear: Tympanic membrane, ear canal and external ear normal. Decreased hearing noted.     Left Ear: Tympanic membrane, ear canal and external ear normal. Decreased hearing noted.     Ears:     Comments: Wears hearing aides    Nose: Nose normal. No mucosal edema, congestion or rhinorrhea.     Right Sinus: No maxillary sinus tenderness or frontal sinus tenderness.     Left Sinus: No maxillary sinus tenderness or frontal sinus tenderness.     Comments: Small ulcer at edge of R nare    Mouth/Throat:     Mouth: Mucous membranes are moist.     Pharynx: Uvula  midline. No oropharyngeal exudate or posterior oropharyngeal erythema.     Tonsils: No tonsillar abscesses.  Eyes:     General: No scleral icterus.    Conjunctiva/sclera: Conjunctivae normal.     Pupils: Pupils are equal, round, and reactive to light.  Neck:     Musculoskeletal: Normal range of motion and neck supple.  Cardiovascular:     Rate and Rhythm: Normal rate and regular rhythm.     Pulses: Normal pulses.     Heart sounds: Normal heart sounds. No murmur.  Pulmonary:     Effort: Pulmonary effort is normal. No respiratory distress.     Breath sounds: Normal breath sounds. No wheezing, rhonchi or rales.     Comments: Lungs clear Lymphadenopathy:     Cervical: No cervical adenopathy.  Skin:    General: Skin is warm and dry.     Findings: No rash.  Neurological:     Mental Status: He is alert.       Results for orders placed or performed in visit on 95/28/41  Basic metabolic panel  Result Value Ref Range   Sodium 138 135 - 145 mEq/L   Potassium 3.9 3.5 - 5.1 mEq/L   Chloride 100 96 - 112 mEq/L   CO2 31 19 - 32 mEq/L   Glucose, Bld 92 70 - 99 mg/dL   BUN 35 (H) 6 - 23 mg/dL   Creatinine, Ser 1.69 (H) 0.40 - 1.50 mg/dL   Calcium 9.0 8.4 - 10.5 mg/dL   GFR 41.28 (L) >60.00 mL/min   Assessment & Plan:   Problem List Items Addressed This Visit    Hemoptysis - Primary    Mild. Anticipate due to self-limited burst capillary from coughing, not deeper lower respiratory issue. Will update CXR today. Supportive care reviewed. Update if not improving for further evaluation.      Relevant Orders   DG Chest 2 View   Heartburn    He has omeprazole at home but regularly takes tums. Advised limiting tums use, rec start omeprazole daily 1-2 wk course, could use OTC pepcid as well.           No orders of the defined types were placed in this encounter.  Orders Placed This Encounter  Procedures  . DG Chest 2 View    Standing Status:   Future    Standing Expiration Date:    12/15/2019    Order Specific Question:   Reason  for Exam (SYMPTOM  OR DIAGNOSIS REQUIRED)    Answer:   hemoptysis, mild    Order Specific Question:   Preferred imaging location?    Answer:   [facility]    Order Specific Question:   Radiology Contrast Protocol - do NOT remove file path    Answer:   [email or web address]    Patient Instructions  Xray today.  I think symptoms may have come from burst capillary after coughing. eliquis increases bleeding risk. Take cough drop or honey with lemon to soothe throat.   For heartburn: Take omeprazole for 1-2 week.  Head of bed elevated. Avoide of citrus, fatty foods, chocolate, peppermint, and excessive alcohol, along with sodas, orange juice (acidic drinks) At least a few hours between dinner and bed, minimize naps after eating.   Follow up plan: Return if symptoms worsen or fail to improve.  Ria Bush, MD

## 2018-10-16 NOTE — Telephone Encounter (Signed)
Appears patient was seen in office today by Dr. Darnell Level.

## 2018-10-16 NOTE — Assessment & Plan Note (Signed)
Mild. Anticipate due to self-limited burst capillary from coughing, not deeper lower respiratory issue. Will update CXR today. Supportive care reviewed. Update if not improving for further evaluation.

## 2018-11-07 ENCOUNTER — Other Ambulatory Visit: Payer: Self-pay | Admitting: *Deleted

## 2018-11-07 MED ORDER — OMEPRAZOLE 20 MG PO CPDR: DELAYED_RELEASE_CAPSULE | ORAL | 3 refills | Status: AC

## 2019-01-16 ENCOUNTER — Telehealth: Payer: Self-pay

## 2019-01-16 NOTE — Telephone Encounter (Signed)
Called patient from recall.  No answer. LMOV.  This is the 3rd attempt per recall list. Will delete recall.

## 2019-02-01 ENCOUNTER — Encounter: Payer: Self-pay | Admitting: Family Medicine

## 2019-02-01 ENCOUNTER — Ambulatory Visit (INDEPENDENT_AMBULATORY_CARE_PROVIDER_SITE_OTHER): Payer: Medicare Other | Admitting: Family Medicine

## 2019-02-01 VITALS — BP 147/90 | HR 67 | Temp 98.1°F | Ht 69.0 in | Wt 191.0 lb

## 2019-02-01 DIAGNOSIS — N138 Other obstructive and reflux uropathy: Secondary | ICD-10-CM

## 2019-02-01 DIAGNOSIS — N183 Chronic kidney disease, stage 3 unspecified: Secondary | ICD-10-CM

## 2019-02-01 DIAGNOSIS — R350 Frequency of micturition: Secondary | ICD-10-CM | POA: Diagnosis not present

## 2019-02-01 DIAGNOSIS — N401 Enlarged prostate with lower urinary tract symptoms: Secondary | ICD-10-CM

## 2019-02-01 LAB — POC URINALSYSI DIPSTICK (AUTOMATED)
Bilirubin, UA: NEGATIVE
Blood, UA: NEGATIVE
Glucose, UA: NEGATIVE
Ketones, UA: NEGATIVE
Nitrite, UA: NEGATIVE
Protein, UA: POSITIVE — AB
Spec Grav, UA: 1.015 (ref 1.010–1.025)
Urobilinogen, UA: 0.2 E.U./dL
pH, UA: 7 (ref 5.0–8.0)

## 2019-02-01 MED ORDER — TAMSULOSIN HCL 0.4 MG PO CAPS: 0.4000 mg | ORAL_CAPSULE | Freq: Every day | ORAL | 1 refills | Status: DC

## 2019-02-01 NOTE — Progress Notes (Signed)
Virtual visit attempted through [employer]. Due to national recommendations of social distancing due to COVID-19, a virtual visit is felt to be most appropriate for this patient at this time. Interactive audio and video telecommunications were attempted between myself and BRANDO TAVES, however failed due to patient having technical difficulties. We continued and completed visit with audio only.   Patient location: home Provider location: [place] [facility], office If any vitals were documented, they were collected by patient at home unless specified below.    BP (!) 147/90 (BP Location: Right Arm, Patient Position: Sitting)   Pulse 67   Temp 98.1 F (36.7 C) (Oral)   Ht 5\' 9"  (1.753 m)   Wt 191 lb (86.6 kg)   BMI 28.21 kg/m    CC: urinary frequency Subjective:    Patient ID: Michael [name], male    DOB: 1931/10/17, 85 y.o.   MRN: [id number]  HPI: Michael [name] is a 85 y.o. male presenting on 02/01/2019 for Urinary Frequency (C/o urinary freqency for last 4 yrs but has worsened recently. )   Urine frequency for years. This is getting worse night > day. Nocturia every 30 min at night. Notes incomplete emptying - voids then if waits 30 seconds voids large amount again.   No fevers/chills, dysuria, hematuria, nausea/vomiting, flank pain.  No recent abx use.   Notes trouble sleeping despite trazodone 100-200mg  nightly.      Relevant past medical, surgical, family and social history reviewed and updated as indicated. Interim medical history since our last visit reviewed. Allergies and medications reviewed and updated. Outpatient Medications Prior to Visit  Medication Sig Dispense Refill  . apixaban (ELIQUIS) 5 MG TABS tablet Take 5 mg by mouth 2 (two) times daily.    . Calcium Carb-Cholecalciferol (CALCIUM+D3 PO) Take 1 tablet by mouth daily.    Marland Kitchen diltiazem (CARDIZEM CD) 120 MG 24 hr capsule TAKE ONE CAPSULE BY MOUTH DAILY 90 capsule 3  . flecainide (TAMBOCOR) 100 MG tablet  TAKE 1 TABLET BY MOUTH TWICE A DAY 180 tablet 3  . gabapentin (NEURONTIN) 100 MG capsule Take 100 mg by mouth at bedtime.    . lovastatin (MEVACOR) 20 MG tablet TAKE 1 TABLET BY MOUTH ONCE DAILY 90 tablet 2  . Multiple Vitamins-Minerals (CENTRUM SILVER ADULT 50+ PO) Take 1 tablet by mouth daily.    . mupirocin ointment (BACTROBAN) 2 % APPLY TO AFFECTED AREA 3 TIMES A DAY  1  . omeprazole (PRILOSEC) 20 MG capsule TAKE 1 CAPSULE BY MOUTH EVERY DAY AS NEEDED. 90 capsule 3  . sildenafil (REVATIO) 20 MG tablet TAKE 2 TO 5 TABLETS BY MOUTH 30 MINUTES PRIOR TO INTERCOURSE AS DIRECTED 50 tablet 2  . traZODone (DESYREL) 100 MG tablet 1 to 2 tablets at night as needed 30 mins before bed 60 tablet 5  . triamcinolone cream (KENALOG) 0.1 % Apply 1 application topically as needed.    . vitamin B-12 (CYANOCOBALAMIN) 1000 MCG tablet Take 1,000 mcg by mouth daily.    . vitamin C (ASCORBIC ACID) 500 MG tablet Take 500 mg by mouth daily.     No facility-administered medications prior to visit.      Per HPI unless specifically indicated in ROS section below Review of Systems Objective:    BP (!) 147/90 (BP Location: Right Arm, Patient Position: Sitting)   Pulse 67   Temp 98.1 F (36.7 C) (Oral)   Ht 5\' 9"  (1.753 m)   Wt 191 lb (86.6 kg)  BMI 28.21 kg/m   Wt Readings from Last 3 Encounters:  02/01/19 191 lb (86.6 kg)  10/16/18 209 lb 8 oz (95 kg)  05/02/18 202 lb 12 oz (92 kg)     Physical exam: Gen: alert, NAD, not ill appearing Pulm: speaks in complete sentences without increased work of breathing Psych: normal mood, normal thought content      Results for orders placed or performed in visit on 02/01/19  POCT Urinalysis Dipstick (Automated)  Result Value Ref Range   Color, UA yellow    Clarity, UA clear    Glucose, UA Negative Negative   Bilirubin, UA negative    Ketones, UA negative    Spec Grav, UA 1.015 1.010 - 1.025   Blood, UA negative    pH, UA 7.0 5.0 - 8.0   Protein, UA  Positive (A) Negative   Urobilinogen, UA 0.2 0.2 or 1.0 E.U./dL   Nitrite, UA negative    Leukocytes, UA Small (1+) (A) Negative   Lab Results  Component Value Date   CREATININE 1.69 (H) 05/16/2018   BUN 35 (H) 05/16/2018   NA 138 05/16/2018   K 3.9 05/16/2018   CL 100 05/16/2018   CO2 31 05/16/2018    Lab Results  Component Value Date   PSA 2.10 04/26/2018   PSA 2.26 12/14/2016   PSA 1.11 06/09/2014    Assessment & Plan:   Problem List Items Addressed This Visit    Urinary frequency - Primary    Ongoing but acutely worsening. Story nor UA/micro consistent with UTI. Anticipate worsening BPH.  Will trial flomax 0.4mg  nightly for 1 month and update with effect - cautioned about orthostatic hypotension/fall risk. Would consider 5a reductase inhibitor.  Pt/wife agree with plan.       Relevant Orders   POCT Urinalysis Dipstick (Automated) (Completed)   CKD (chronic kidney disease) stage 3, GFR 30-59 ml/min (HCC) (Chronic)   BPH with obstruction/lower urinary tract symptoms    Longstanding history. Last PSA normal (04/2018) Start flomax. Reviewed taking at night, monitoring for orthostatic hypotension. Struggling with night time awakenings affecting ability to sleep - will work towards better nocturia control then reassess insomnia.       Relevant Medications   tamsulosin (FLOMAX) 0.4 MG CAPS capsule       Meds ordered this encounter  Medications  . DISCONTD: tamsulosin (FLOMAX) 0.4 MG CAPS capsule    Sig: Take 1 capsule (0.4 mg total) by mouth daily.    Dispense:  30 capsule    Refill:  1  . tamsulosin (FLOMAX) 0.4 MG CAPS capsule    Sig: Take 1 capsule (0.4 mg total) by mouth daily.    Dispense:  30 capsule    Refill:  1   Orders Placed This Encounter  Procedures  . POCT Urinalysis Dipstick (Automated)    Follow up plan: Return if symptoms worsen or fail to improve.  Ria Bush, MD

## 2019-02-01 NOTE — Assessment & Plan Note (Addendum)
Ongoing but acutely worsening. Story nor UA/micro consistent with UTI. Anticipate worsening BPH.  Will trial flomax 0.4mg  nightly for 1 month and update with effect - cautioned about orthostatic hypotension/fall risk. Would consider 5a reductase inhibitor.  Pt/wife agree with plan.

## 2019-02-01 NOTE — Assessment & Plan Note (Addendum)
Longstanding history. Last PSA normal (04/2018) Start flomax. Reviewed taking at night, monitoring for orthostatic hypotension. Struggling with night time awakenings affecting ability to sleep - will work towards better nocturia control then reassess insomnia.

## 2019-02-05 ENCOUNTER — Telehealth: Payer: Self-pay | Admitting: Family Medicine

## 2019-02-05 NOTE — Telephone Encounter (Signed)
There are no barriers here. I have known him about 10 years and he is a very nice guy.

## 2019-02-05 NOTE — Telephone Encounter (Signed)
Pt is requesting to transfer care from Dr Lorelei Pont to Dr Danise Mina. He spoke with Dr Darnell Level 5/22 and likes his availability.  OK to transfer?

## 2019-02-05 NOTE — Telephone Encounter (Signed)
Noted. Ok to switch - thanks.

## 2019-02-05 NOTE — Telephone Encounter (Signed)
Noted  

## 2019-02-06 NOTE — Telephone Encounter (Signed)
Pt is set up for August AWV. Do you want to see him for [facility] prior to this or is his Aug annual ok for next visit?

## 2019-02-06 NOTE — Telephone Encounter (Signed)
Ok to keep Michael [name] thanks.

## 2019-02-07 ENCOUNTER — Telehealth: Payer: Self-pay | Admitting: Family Medicine

## 2019-02-07 NOTE — Telephone Encounter (Signed)
Noted! Thank you

## 2019-02-07 NOTE — Telephone Encounter (Signed)
I spoke with pt and rescheduled annual with Dr Danise Mina in Aug

## 2019-02-07 NOTE — Telephone Encounter (Signed)
Spoke with pt spouse, Festus Holts, to set up TOC/cpe in Aug. She mentioned his BP has been running low and they were a little concerned about waiting until Aug. She said his last reading was 89/52 with 75 pulse. I set him up for a phone call appt at 3:30 pm tomorrow 5/29, earliest agreed upon time. I offered to transfer to a nurse but was declined.

## 2019-02-08 ENCOUNTER — Ambulatory Visit (INDEPENDENT_AMBULATORY_CARE_PROVIDER_SITE_OTHER): Payer: Medicare Other | Admitting: Family Medicine

## 2019-02-08 ENCOUNTER — Encounter: Payer: Self-pay | Admitting: Family Medicine

## 2019-02-08 VITALS — BP 118/63 | HR 62 | Temp 96.9°F | Wt 195.0 lb

## 2019-02-08 DIAGNOSIS — N401 Enlarged prostate with lower urinary tract symptoms: Secondary | ICD-10-CM | POA: Diagnosis not present

## 2019-02-08 DIAGNOSIS — N138 Other obstructive and reflux uropathy: Secondary | ICD-10-CM

## 2019-02-08 DIAGNOSIS — F5104 Psychophysiologic insomnia: Secondary | ICD-10-CM

## 2019-02-08 MED ORDER — MELATONIN 10 MG PO CAPS: 1.0000 | ORAL_CAPSULE | Freq: Every day | ORAL | Status: DC

## 2019-02-08 MED ORDER — FINASTERIDE 5 MG PO TABS: 5.0000 mg | ORAL_TABLET | Freq: Every day | ORAL | 6 refills | Status: DC

## 2019-02-08 NOTE — Progress Notes (Signed)
   Michael [name] - 85 y.o. male  MRN [id number]  Date of Birth: 16-May-1932  PCP: Ria Bush, MD  This service was provided via telemedicine. Phone Visit performed on 02/08/2019    Rationale for phone visit along with limitations reviewed. Patient consented to telephone encounter.    Location of patient: home Location of provider: office, [place] [facility] Name of referring provider: N/A   Names of persons and role in encounter: Provider: Ria Bush, MD  Patient: Michael [name]  Other: N/A   Time on call: 3:40pm - 3:50pm   Subjective: Chief Complaint  Patient presents with  . Hypotension    has been getting low b/p readings since starting Tamsulosin on 02/01/2019. Has been getting dizzy-not new symptom.     HPI:  Recently transferred care from Dr Lorelei Pont to myself.   Seen last week with urinary frequency, UA didn't show infection. Thought due to worsening BPH. Flomax 0.4mg  started nightly - since then noticing low blood pressures and worsening dizziness. He has found flomax has helped decrease nocturia.   Woke up this morning - bp at 11:30am 83/53, pulse 70. 87/52 HR 67.   Insomnia - trazodone is not helping. Took 150mg  last night - no help. Sleeps the best after 7-8am. Melatonin 10mg  has helped. Advised to take 1 hr before bedtime.   Objective/Observations:   No physical exam or vital signs collected unless specifically identified below.   BP 118/63 Comment: per patient  Pulse 62 Comment: per patient  Temp (!) 96.9 F (36.1 C) Comment: per patient  Wt 195 lb (88.5 kg) Comment: per patient  BMI 28.80 kg/m    Respiratory status: speaks in complete sentences without evident shortness of breath.   Assessment/Plan:  BPH with obstruction/lower urinary tract symptoms flomax caused hypotension in am - will stop. Start proscar 5mg  nightly. Discussed slow acting nature of medication. Update with effect.  Chronic insomnia Trazodone not effective -  will add melatonin 10mg  nightly.    I discussed the assessment and treatment plan with the patient. The patient was provided an opportunity to ask questions and all were answered. The patient agreed with the plan and demonstrated an understanding of the instructions.  Lab Orders  No laboratory test(s) ordered today    Meds ordered this encounter  Medications  . finasteride (PROSCAR) 5 MG tablet    Sig: Take 1 tablet (5 mg total) by mouth daily.    Dispense:  30 tablet    Refill:  6  . Melatonin 10 MG CAPS    Sig: Take 1 capsule by mouth at bedtime.    Dispense:  30 capsule    The patient was advised to call back or seek an in-person evaluation if the symptoms worsen or if the condition fails to improve as anticipated.  Ria Bush, MD

## 2019-02-08 NOTE — Assessment & Plan Note (Signed)
flomax caused hypotension in am - will stop. Start proscar 5mg  nightly. Discussed slow acting nature of medication. Update with effect.

## 2019-02-08 NOTE — Assessment & Plan Note (Signed)
Trazodone not effective - will add melatonin 10mg  nightly.

## 2019-03-26 ENCOUNTER — Telehealth: Payer: Self-pay

## 2019-03-26 NOTE — Telephone Encounter (Signed)
plz call to review - what meds is he currently taking? Specifically is he taking diltiazem and flecainide daily? Several of his meds haven't been refilled since 2018?  Encourage increased water intake over next several days to help with blood pressures.  Looks like hasn't f/u with Dr Rockey Situ in 2 yrs.

## 2019-03-26 NOTE — Telephone Encounter (Signed)
Michael [name] signed) said that for few weeks BP has been low on and off. 03/23/19 BP 92/49; on 03/24/19 at 4 PM BP was 92/56 P 67; at 4:10 PM BP 76/48 P 66. On 03/25/19 at 2 PM BP was 107/62 P 68. Pt has had dizziness on and off for 1 year. No H/A,CP, or SOB. No covid symptoms; no travel and no known exposure to covid. Pt also has problems for long time getting to sleep at night. Pt is asleep now and Michael [name] does not want to wake him. Offered appt today with Michael [name] but only wants to see Michael [name]. Pt cannot schedule appt on 03/27/19. Michael [name] scheduled 81' in office appt with Michael [name] on 03/28/19.11:15. ED precautions given and Michael [name] voiced understanding.

## 2019-03-26 NOTE — Telephone Encounter (Signed)
Spoke with pt and wife, Michael [name], relaying Dr. Synthia Innocent message.  Says pt is taking everything on med list (reviewed with pt's wife) except finasteride and sildenafil.  Says pt stopped finasteride for past 2 nights to see if that was contributing to dizziness.  The sildenafil he no longer takes at all.   Also, pt's wife says pt saw Dr. Rockey Situ 04/2018 and will schedule appt for 04/2019.  Also, reports pt drinks about 64 oz of water daily.

## 2019-03-28 ENCOUNTER — Telehealth: Payer: Self-pay | Admitting: Family Medicine

## 2019-03-28 ENCOUNTER — Other Ambulatory Visit: Payer: Self-pay

## 2019-03-28 ENCOUNTER — Encounter: Payer: Self-pay | Admitting: Family Medicine

## 2019-03-28 ENCOUNTER — Ambulatory Visit (INDEPENDENT_AMBULATORY_CARE_PROVIDER_SITE_OTHER): Payer: Medicare Other | Admitting: Family Medicine

## 2019-03-28 VITALS — BP 104/60 | HR 61 | Temp 98.0°F | Ht 69.0 in | Wt 200.1 lb

## 2019-03-28 DIAGNOSIS — I480 Paroxysmal atrial fibrillation: Secondary | ICD-10-CM | POA: Diagnosis not present

## 2019-03-28 DIAGNOSIS — R42 Dizziness and giddiness: Secondary | ICD-10-CM

## 2019-03-28 DIAGNOSIS — I471 Supraventricular tachycardia, unspecified: Secondary | ICD-10-CM

## 2019-03-28 DIAGNOSIS — N183 Chronic kidney disease, stage 3 unspecified: Secondary | ICD-10-CM

## 2019-03-28 DIAGNOSIS — I10 Essential (primary) hypertension: Secondary | ICD-10-CM | POA: Diagnosis not present

## 2019-03-28 DIAGNOSIS — F5104 Psychophysiologic insomnia: Secondary | ICD-10-CM

## 2019-03-28 NOTE — Patient Instructions (Signed)
EKG today Stop diltiazem for now. Monitor heart rate and blood pressures at home, let us know right away if any recurrent racing heart.  Keep appointment with Dr Rockey Situ next month.  Use compression stockings.

## 2019-03-28 NOTE — Assessment & Plan Note (Signed)
BP now low - see below.

## 2019-03-28 NOTE — Assessment & Plan Note (Signed)
Longstanding complaint, evaluation has been largely unrevealing. Today is hypotensive but not orthostatic. Will stop diltiazem CD (was on lowest dose) and I have asked him to monitor BP, HR and monitor for recurrent tachycardias (in h/o SVT). Continues flecainide for now. Has f/u with cards next month. Will route note to Dr Rockey Situ.

## 2019-03-28 NOTE — Assessment & Plan Note (Addendum)
Ongoing struggle with sleep initiation insomnia despite trazodone and melatonin. Will need to review sleep hygiene measures. Would avoid benzo/hypnotic at this time.

## 2019-03-28 NOTE — Telephone Encounter (Signed)
Spoke with pt's wife, Festus Holts (on dpr), relaying Dr. Synthia Innocent message.  Scheduled lab visit for 03/29/19 at 12:00.

## 2019-03-28 NOTE — Telephone Encounter (Signed)
Forgot to check labs today - I would like patient to come in at his convenience for labwork - doesn't have to be fasting for this.

## 2019-03-28 NOTE — Progress Notes (Signed)
This visit was conducted in person.  BP 104/60 (BP Location: Right Arm, Patient Position: Sitting, Cuff Size: Normal)   Pulse 61   Temp 98 F (36.7 C) (Temporal)   Ht 5\' 9"  (1.753 m)   Wt 200 lb 2 oz (90.8 kg)   SpO2 96%   BMI 29.55 kg/m   Orthostatic VS for the past 24 hrs (Last 3 readings):  BP- Lying BP- Standing at 0 minutes  03/28/19 1129 - 98/52  03/28/19 1127 102/56 -    CC: low blood pressures Subjective:    Patient ID: Michael [name], male    DOB: 1931/09/15, 85 y.o.   MRN: [id number]  HPI: Michael [name] is a 85 y.o. male presenting on 03/28/2019 for Low BP (C/o decreased BP.  Started 2 wks ago. ) and Dizziness (C/o dizziness on and off for past yr. )   Longstanding history of dizziness/lightheadedness, more recently noticing low blood pressures as well. Brings log showing BP 80-100s/40-60s, HR 60-80s over the past week. Has not felt tachypalpitations. They feel he did better while on metoprolol but this was stopped 2 yrs ago due to symptomatic bradycardia. Denies syncope/LOC.   Known Afib and PSVT on flecainide 100mg  bid and eliquis. Last saw cardiology 04/2017 Rockey Situ). Has f/u appt next month.   Has tried CBD oil over the past week for aches - no significant benefit.   Ongoing chronic insomnia despite trazodone 100mg  and melatonin 10mg  - does not take daytime naps. Has bedtime routine. Wife takes Lorrin Mais - tried one of hers without any benefit.      Relevant past medical, surgical, family and social history reviewed and updated as indicated. Interim medical history since our last visit reviewed. Allergies and medications reviewed and updated. Outpatient Medications Prior to Visit  Medication Sig Dispense Refill  . apixaban (ELIQUIS) 5 MG TABS tablet Take 5 mg by mouth 2 (two) times daily.    . Calcium Carb-Cholecalciferol (CALCIUM+D3 PO) Take 1 tablet by mouth daily.    Marland Kitchen diltiazem (CARDIZEM CD) 120 MG 24 hr capsule TAKE ONE CAPSULE BY MOUTH DAILY 90 capsule 3  .  flecainide (TAMBOCOR) 100 MG tablet TAKE 1 TABLET BY MOUTH TWICE A DAY 180 tablet 3  . gabapentin (NEURONTIN) 100 MG capsule Take 100 mg by mouth at bedtime.    . lovastatin (MEVACOR) 20 MG tablet TAKE 1 TABLET BY MOUTH ONCE DAILY 90 tablet 2  . Melatonin 10 MG CAPS Take 1 capsule by mouth at bedtime. 30 capsule   . Multiple Vitamins-Minerals (CENTRUM SILVER ADULT 50+ PO) Take 1 tablet by mouth daily.    Marland Kitchen omeprazole (PRILOSEC) 20 MG capsule TAKE 1 CAPSULE BY MOUTH EVERY DAY AS NEEDED. 90 capsule 3  . traZODone (DESYREL) 100 MG tablet 1 to 2 tablets at night as needed 30 mins before bed 60 tablet 5  . triamcinolone cream (KENALOG) 0.1 % Apply 1 application topically as needed.    . vitamin B-12 (CYANOCOBALAMIN) 1000 MCG tablet Take 1,000 mcg by mouth daily.    . vitamin C (ASCORBIC ACID) 500 MG tablet Take 500 mg by mouth daily.    . finasteride (PROSCAR) 5 MG tablet Take 1 tablet (5 mg total) by mouth daily. (Patient not taking: Reported on 03/28/2019) 30 tablet 6   No facility-administered medications prior to visit.      Per HPI unless specifically indicated in ROS section below Review of Systems Objective:    BP 104/60 (BP Location: Right Arm, Patient Position:  Sitting, Cuff Size: Normal)   Pulse 61   Temp 98 F (36.7 C) (Temporal)   Ht 5\' 9"  (1.753 m)   Wt 200 lb 2 oz (90.8 kg)   SpO2 96%   BMI 29.55 kg/m   Wt Readings from Last 3 Encounters:  03/28/19 200 lb 2 oz (90.8 kg)  02/08/19 195 lb (88.5 kg)  02/01/19 191 lb (86.6 kg)    Physical Exam Vitals signs and nursing note reviewed.  Constitutional:      General: He is not in acute distress.    Appearance: Normal appearance. He is not ill-appearing.  HENT:     Right Ear: Decreased hearing noted.     Left Ear: Decreased hearing noted.     Ears:     Comments: Hard of hearing despite aides    Mouth/Throat:     Mouth: Mucous membranes are moist.     Pharynx: No posterior oropharyngeal erythema.  Eyes:     Extraocular  Movements: Extraocular movements intact.     Pupils: Pupils are equal, round, and reactive to light.  Cardiovascular:     Rate and Rhythm: Normal rate and regular rhythm.     Pulses: Normal pulses.     Heart sounds: Normal heart sounds. No murmur.  Pulmonary:     Effort: Pulmonary effort is normal. No respiratory distress.     Breath sounds: Normal breath sounds. No wheezing, rhonchi or rales.  Musculoskeletal:     Right lower leg: No edema.     Left lower leg: No edema.  Neurological:     Mental Status: He is alert.  Psychiatric:        Mood and Affect: Mood normal.        Behavior: Behavior normal.       Lab Results  Component Value Date   CREATININE 1.69 (H) 05/16/2018   BUN 35 (H) 05/16/2018   NA 138 05/16/2018   K 3.9 05/16/2018   CL 100 05/16/2018   CO2 31 05/16/2018    EKG - sinus bradycardia 50s, normal axis, intervals, no acute ST/T changes, good R wave progression Assessment & Plan:   Problem List Items Addressed This Visit    SVT (supraventricular tachycardia) (HCC)   Essential hypertension (Chronic)    BP now low - see below.       Dizziness - Primary    Longstanding complaint, evaluation has been largely unrevealing. Today is hypotensive but not orthostatic. Will stop diltiazem CD (was on lowest dose) and I have asked him to monitor BP, HR and monitor for recurrent tachycardias (in h/o SVT). Continues flecainide for now. Has f/u with cards next month. Will route note to Dr Rockey Situ.      Relevant Orders   EKG 12-Lead (Completed)   CKD (chronic kidney disease) stage 3, GFR 30-59 ml/min (HCC) (Chronic)    Update renal panel.       Chronic insomnia    Ongoing struggle with sleep initiation insomnia despite trazodone and melatonin. Will need to review sleep hygiene measures. Would avoid benzo/hypnotic at this time.       Atrial fibrillation (HCC) (Chronic)    Chronic. EKG with sinus bradycardia today.  May need to consider lower dose eliquis pending Cr  (>1.5) given age.          No orders of the defined types were placed in this encounter.  Orders Placed This Encounter  Procedures  . EKG 12-Lead    Follow up plan: Return if symptoms  worsen or fail to improve.  Ria Bush, MD

## 2019-03-28 NOTE — Assessment & Plan Note (Addendum)
Chronic. EKG with sinus bradycardia today.  May need to consider lower dose eliquis pending Cr (>1.5) given age.

## 2019-03-28 NOTE — Assessment & Plan Note (Signed)
Update renal panel 

## 2019-03-29 ENCOUNTER — Other Ambulatory Visit (INDEPENDENT_AMBULATORY_CARE_PROVIDER_SITE_OTHER): Payer: Medicare Other

## 2019-03-29 DIAGNOSIS — R42 Dizziness and giddiness: Secondary | ICD-10-CM

## 2019-03-29 LAB — RENAL FUNCTION PANEL
Albumin: 4.1 g/dL (ref 3.5–5.2)
BUN: 45 mg/dL — ABNORMAL HIGH (ref 6–23)
CO2: 29 mEq/L (ref 19–32)
Calcium: 8.7 mg/dL (ref 8.4–10.5)
Chloride: 102 mEq/L (ref 96–112)
Creatinine, Ser: 1.86 mg/dL — ABNORMAL HIGH (ref 0.40–1.50)
GFR: 34.7 mL/min — ABNORMAL LOW (ref >60.00)
Glucose, Bld: 83 mg/dL (ref 70–99)
Phosphorus: 4.3 mg/dL (ref 2.3–4.6)
Potassium: 4.4 mEq/L (ref 3.5–5.1)
Sodium: 138 mEq/L (ref 135–145)

## 2019-03-29 LAB — CBC WITH DIFFERENTIAL/PLATELET
Basophils Absolute: 0.1 10*3/uL (ref 0.0–0.1)
Basophils Relative: 0.9 % (ref 0.0–3.0)
Eosinophils Absolute: 0.1 10*3/uL (ref 0.0–0.7)
Eosinophils Relative: 1.7 % (ref 0.0–5.0)
HCT: 36.1 % — ABNORMAL LOW (ref 39.0–52.0)
Hemoglobin: 12.1 g/dL — ABNORMAL LOW (ref 13.0–17.0)
Lymphocytes Relative: 18.5 % (ref 12.0–46.0)
Lymphs Abs: 1.1 10*3/uL (ref 0.7–4.0)
MCHC: 33.6 g/dL (ref 30.0–36.0)
MCV: 97.7 fl (ref 78.0–100.0)
Monocytes Absolute: 0.7 10*3/uL (ref 0.1–1.0)
Monocytes Relative: 11.4 % (ref 3.0–12.0)
Neutro Abs: 4 10*3/uL (ref 1.4–7.7)
Neutrophils Relative %: 67.5 % (ref 43.0–77.0)
Platelets: 232 10*3/uL (ref 150.0–400.0)
RBC: 3.69 Mil/uL — ABNORMAL LOW (ref 4.22–5.81)
RDW: 14.4 % (ref 11.5–15.5)
WBC: 5.9 10*3/uL (ref 4.0–10.5)

## 2019-03-29 LAB — TSH: TSH: 2.2 u[IU]/mL (ref 0.35–4.50)

## 2019-04-23 ENCOUNTER — Ambulatory Visit: Payer: Medicare Other | Admitting: Cardiovascular Disease

## 2019-05-06 ENCOUNTER — Ambulatory Visit: Payer: Medicare Other

## 2019-05-08 ENCOUNTER — Other Ambulatory Visit: Payer: Self-pay

## 2019-05-08 ENCOUNTER — Encounter: Payer: Self-pay | Admitting: Family Medicine

## 2019-05-08 ENCOUNTER — Ambulatory Visit (INDEPENDENT_AMBULATORY_CARE_PROVIDER_SITE_OTHER): Payer: Medicare Other | Admitting: Family Medicine

## 2019-05-08 VITALS — BP 136/74 | HR 65 | Temp 98.7°F | Ht 68.5 in | Wt 199.1 lb

## 2019-05-08 DIAGNOSIS — N138 Other obstructive and reflux uropathy: Secondary | ICD-10-CM

## 2019-05-08 DIAGNOSIS — Z23 Encounter for immunization: Secondary | ICD-10-CM

## 2019-05-08 DIAGNOSIS — E785 Hyperlipidemia, unspecified: Secondary | ICD-10-CM

## 2019-05-08 DIAGNOSIS — R440 Auditory hallucinations: Secondary | ICD-10-CM

## 2019-05-08 DIAGNOSIS — Z7189 Other specified counseling: Secondary | ICD-10-CM | POA: Insufficient documentation

## 2019-05-08 DIAGNOSIS — N183 Chronic kidney disease, stage 3 unspecified: Secondary | ICD-10-CM

## 2019-05-08 DIAGNOSIS — H9193 Unspecified hearing loss, bilateral: Secondary | ICD-10-CM

## 2019-05-08 DIAGNOSIS — R42 Dizziness and giddiness: Secondary | ICD-10-CM

## 2019-05-08 DIAGNOSIS — Z0000 Encounter for general adult medical examination without abnormal findings: Secondary | ICD-10-CM | POA: Diagnosis not present

## 2019-05-08 DIAGNOSIS — I471 Supraventricular tachycardia, unspecified: Secondary | ICD-10-CM

## 2019-05-08 DIAGNOSIS — F5104 Psychophysiologic insomnia: Secondary | ICD-10-CM

## 2019-05-08 DIAGNOSIS — G4752 REM sleep behavior disorder: Secondary | ICD-10-CM

## 2019-05-08 DIAGNOSIS — I700 Atherosclerosis of aorta: Secondary | ICD-10-CM

## 2019-05-08 DIAGNOSIS — I480 Paroxysmal atrial fibrillation: Secondary | ICD-10-CM

## 2019-05-08 DIAGNOSIS — Z85038 Personal history of other malignant neoplasm of large intestine: Secondary | ICD-10-CM

## 2019-05-08 DIAGNOSIS — I10 Essential (primary) hypertension: Secondary | ICD-10-CM

## 2019-05-08 MED ORDER — DILTIAZEM HCL ER COATED BEADS 120 MG PO CP24: 120.0000 mg | ORAL_CAPSULE | Freq: Every day | ORAL | 0 refills | Status: DC

## 2019-05-08 NOTE — Assessment & Plan Note (Signed)
Preventative protocols reviewed and updated unless pt declined. Discussed healthy diet and lifestyle.  

## 2019-05-08 NOTE — Assessment & Plan Note (Signed)

## 2019-05-08 NOTE — Patient Instructions (Addendum)
Flu shot today Return for fasting blood work at [facility]. Restart diltiazem CD 120mg  once daily until you see Dr Rockey Situ.  Check on vaccines at the [place] and let me know.  If interested, check with pharmacy about new 2 shot shingles series (shingrix).  Return to see hearing doctor.  Return in 1 month for memory testing and geriatric assessment.   Health Maintenance After Age 83 After age 52, you are at a higher risk for certain long-term diseases and infections as well as injuries from falls. Falls are a major cause of broken bones and head injuries in people who are older than age 30. Getting regular preventive care can help to keep you healthy and well. Preventive care includes getting regular testing and making lifestyle changes as recommended by your health care provider. Talk with your health care provider about:  Which screenings and tests you should have. A screening is a test that checks for a disease when you have no symptoms.  A diet and exercise plan that is right for you. What should I know about screenings and tests to prevent falls? Screening and testing are the best ways to find a health problem early. Early diagnosis and treatment give you the best chance of managing medical conditions that are common after age 2. Certain conditions and lifestyle choices may make you more likely to have a fall. Your health care provider may recommend:  Regular vision checks. Poor vision and conditions such as cataracts can make you more likely to have a fall. If you wear glasses, make sure to get your prescription updated if your vision changes.  Medicine review. Work with your health care provider to regularly review all of the medicines you are taking, including over-the-counter medicines. Ask your health care provider about any side effects that may make you more likely to have a fall. Tell your health care provider if any medicines that you take make you feel dizzy or  sleepy.  Osteoporosis screening. Osteoporosis is a condition that causes the bones to get weaker. This can make the bones weak and cause them to break more easily.  Blood pressure screening. Blood pressure changes and medicines to control blood pressure can make you feel dizzy.  Strength and balance checks. Your health care provider may recommend certain tests to check your strength and balance while standing, walking, or changing positions.  Foot health exam. Foot pain and numbness, as well as not wearing proper footwear, can make you more likely to have a fall.  Depression screening. You may be more likely to have a fall if you have a fear of falling, feel emotionally low, or feel unable to do activities that you used to do.  Alcohol use screening. Using too much alcohol can affect your balance and may make you more likely to have a fall. What actions can I take to lower my risk of falls? General instructions  Talk with your health care provider about your risks for falling. Tell your health care provider if: ? You fall. Be sure to tell your health care provider about all falls, even ones that seem minor. ? You feel dizzy, sleepy, or off-balance.  Take over-the-counter and prescription medicines only as told by your health care provider. These include any supplements.  Eat a healthy diet and maintain a healthy weight. A healthy diet includes low-fat dairy products, low-fat (lean) meats, and fiber from whole grains, beans, and lots of fruits and vegetables. Home safety  Remove any tripping hazards,  such as rugs, cords, and clutter.  Install safety equipment such as grab bars in bathrooms and safety rails on stairs.  Keep rooms and walkways well-lit. Activity   Follow a regular exercise program to stay fit. This will help you maintain your balance. Ask your health care provider what types of exercise are appropriate for you.  If you need a cane or walker, use it as recommended by  your health care provider.  Wear supportive shoes that have nonskid soles. Lifestyle  Do not drink alcohol if your health care provider tells you not to drink.  If you drink alcohol, limit how much you have: ? 0-1 drink a day for women. ? 0-2 drinks a day for men.  Be aware of how much alcohol is in your drink. In the [place] one drink equals one typical bottle of beer (12 oz), one-half glass of wine (5 oz), or one shot of hard liquor (1 oz).  Do not use any products that contain nicotine or tobacco, such as cigarettes and e-cigarettes. If you need help quitting, ask your health care provider. Summary  Having a healthy lifestyle and getting preventive care can help to protect your health and wellness after age 64.  Screening and testing are the best way to find a health problem early and help you avoid having a fall. Early diagnosis and treatment give you the best chance for managing medical conditions that are more common for people who are older than age 52.  Falls are a major cause of broken bones and head injuries in people who are older than age 32. Take precautions to prevent a fall at home.  Work with your health care provider to learn what changes you can make to improve your health and wellness and to prevent falls. This information is not intended to replace advice given to you by your health care provider. Make sure you discuss any questions you have with your health care provider. Document Released: 07/12/2017 Document Revised: 12/20/2018 Document Reviewed: 07/12/2017 Elsevier Patient Education  2020 [employer].

## 2019-05-08 NOTE — Progress Notes (Signed)
This visit was conducted in person.  BP 136/74 (BP Location: Left Arm, Patient Position: Sitting, Cuff Size: Normal)   Pulse 65   Temp 98.7 F (37.1 C) (Temporal)   Ht 5' 8.5" (1.74 m)   Wt 199 lb 2 oz (90.3 kg)   SpO2 97%   BMI 29.84 kg/m    CC: AMW/CPE Subjective:    Patient ID: Michael [name], male    DOB: 1931-10-16, 85 y.o.   MRN: [id number]  HPI: Michael [name] is a 85 y.o. male presenting on 05/08/2019 for Medicare Wellness   Did not see health advisor this year. Dizziness- last month we stopped diltiazem CD (h/o afib), he remained on flecainide. Now states he was taking 240mg  of diltiazem CD from [place] cardiologist (med list had him on 120mg ). Now fully off diltiazem. He did see Dr Reece Agar cardiologist through the [facility] - placed on 14d event monitor - results are pending. Wife canceled Dr Donivan Scull appt - has rescheduled to early September.   Brings BP log showing marked fluctuations - many low blood pressures to 60-90/40s, some high recently to 160/90s, HR 60-120. Feels better when BP is higher. No significant change to dizziness off diltiazem.   Endorses auditory hallucinations ongoing for years - multiple voices male and male singing hears late at night and early in the morning. This is not bothersome to patient, he has gotten used to it.   Recently ate lunch.  Hearing getting worse despite wearing hearing aides. Needs to return to see audiologist.    Hearing Screening   125Hz  250Hz  500Hz  1000Hz  2000Hz  3000Hz  4000Hz  6000Hz  8000Hz   Right ear:           Left ear:           Comments: Wears bilateral hearing aids  Vision Screening Comments: Last eye exam, 06/2018    Office Visit from 05/08/2019 in [place] [facility]  PHQ-2 Total Score  0      Fall Risk  05/08/2019 04/26/2018 12/19/2016 11/10/2015 06/12/2014  Falls in the past year? 0 No Yes No No  Number falls in past yr: - - 1 - -  Injury with Fall? - - No - -      Preventative: H/o colon cancer s/p  hemicolectomy 1998 (Dr Tamala Julian). Last colonoscopy 2016 [facility] Ina) - mild diverticulosis, stable surgical anastomosis, 5 polyps removed Prostate cancer screening - aged out Lung cancer screening - not eligible Flu shot yearly Tetanus shot - unsure Pneumovax 2014, prevnar 2016 Zostavax - did not receive  Shingrix - discussed. To check with pharmacy.  Advanced directive discussion - has at home. Wife and son are HCPOA. Asked to bring [place] copy.  Seat belt use discussed Sunscreen use discussed. No changing moles on skin. Dentist - yearly Eye exam - yearly Non smoker Alcohol - 1 glass of wine every 2 wks Bowels - no constipation Bladder - no incontinence  Married.  Independent with ADLs/ambulation. [name]'s father in law (front office). Son is Administrator, sports in Biomedical scientist [employer]. 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 Interim medical history since our last visit reviewed. Allergies and medications reviewed and updated. Outpatient Medications Prior to Visit  Medication Sig Dispense Refill  . apixaban (ELIQUIS) 5 MG TABS tablet Take 5 mg by mouth 2 (two) times daily.    . Calcium  Carb-Cholecalciferol (CALCIUM+D3 PO) Take 1 tablet by mouth daily.    . flecainide (TAMBOCOR) 100 MG tablet TAKE 1 TABLET BY MOUTH TWICE A DAY 180 tablet 3  . gabapentin (NEURONTIN) 100 MG capsule Take 100 mg by mouth at bedtime.    . lovastatin (MEVACOR) 20 MG tablet TAKE 1 TABLET BY MOUTH ONCE DAILY 90 tablet 2  . Melatonin 10 MG CAPS Take 1 capsule by mouth at bedtime. 30 capsule   . Multiple Vitamins-Minerals (CENTRUM SILVER ADULT 50+ PO) Take 1 tablet by mouth daily.    Marland Kitchen omeprazole (PRILOSEC) 20 MG capsule TAKE 1 CAPSULE BY MOUTH EVERY DAY AS NEEDED. 90 capsule 3  . traZODone (DESYREL) 100 MG tablet 1 to 2 tablets at night as needed 30 mins before  bed 60 tablet 5  . triamcinolone cream (KENALOG) 0.1 % Apply 1 application topically as needed.    . finasteride (PROSCAR) 5 MG tablet Take 1 tablet (5 mg total) by mouth daily. (Patient not taking: Reported on 03/28/2019) 30 tablet 6  . diltiazem (CARDIZEM CD) 120 MG 24 hr capsule TAKE ONE CAPSULE BY MOUTH DAILY 90 capsule 3  . vitamin B-12 (CYANOCOBALAMIN) 1000 MCG tablet Take 1,000 mcg by mouth daily.    . vitamin C (ASCORBIC ACID) 500 MG tablet Take 500 mg by mouth daily.     No facility-administered medications prior to visit.      Per HPI unless specifically indicated in ROS section below Review of Systems  Constitutional: Negative for activity change, appetite change, chills, fatigue, fever and unexpected weight change.  HENT: Negative for hearing loss.   Eyes: Negative for visual disturbance.  Respiratory: Negative for cough, chest tightness, shortness of breath and wheezing.   Cardiovascular: Positive for leg swelling. Negative for chest pain and palpitations.  Gastrointestinal: Negative for abdominal distention, abdominal pain, blood in stool, constipation, diarrhea, nausea and vomiting.  Genitourinary: Negative for difficulty urinating and hematuria.  Musculoskeletal: Negative for arthralgias, myalgias and neck pain.  Skin: Negative for rash.  Neurological: Positive for dizziness. Negative for seizures, syncope and headaches.  Hematological: Negative for adenopathy. Does not bruise/bleed easily.  Psychiatric/Behavioral: Negative for dysphoric mood. The patient is not nervous/anxious.    Objective:    BP 136/74 (BP Location: Left Arm, Patient Position: Sitting, Cuff Size: Normal)   Pulse 65   Temp 98.7 F (37.1 C) (Temporal)   Ht 5' 8.5" (1.74 m)   Wt 199 lb 2 oz (90.3 kg)   SpO2 97%   BMI 29.84 kg/m   Wt Readings from Last 3 Encounters:  05/08/19 199 lb 2 oz (90.3 kg)  03/28/19 200 lb 2 oz (90.8 kg)  02/08/19 195 lb (88.5 kg)    Physical Exam Vitals signs and  nursing note reviewed.  Constitutional:      General: He is not in acute distress.    Appearance: Normal appearance. He is well-developed. He is not ill-appearing.  HENT:     Head: Normocephalic and atraumatic.     Right Ear: Tympanic membrane, ear canal and external ear normal. Decreased hearing noted.     Left Ear: Tympanic membrane, ear canal and external ear normal. Decreased hearing noted.     Ears:     Comments:  Marked hearing loss despite hearing aides. Cerumen bilaterally s/p irrigation today    Nose: Nose normal.     Mouth/Throat:     Mouth: Mucous membranes are moist.     Pharynx: Uvula midline. No oropharyngeal exudate or posterior oropharyngeal  erythema.  Eyes:     General: No scleral icterus.    Extraocular Movements: Extraocular movements intact.     Conjunctiva/sclera: Conjunctivae normal.     Pupils: Pupils are equal, round, and reactive to light.  Neck:     Musculoskeletal: Normal range of motion and neck supple.  Cardiovascular:     Rate and Rhythm: Normal rate and regular rhythm.     Pulses: Normal pulses.          Radial pulses are 2+ on the right side and 2+ on the left side.     Heart sounds: Normal heart sounds. No murmur.  Pulmonary:     Effort: Pulmonary effort is normal. No respiratory distress.     Breath sounds: Normal breath sounds. No wheezing, rhonchi or rales.  Abdominal:     General: Abdomen is flat. Bowel sounds are normal. There is no distension.     Palpations: Abdomen is soft. There is no mass.     Tenderness: There is no abdominal tenderness. There is no guarding or rebound.     Hernia: No hernia is present.  Musculoskeletal: Normal range of motion.     Right lower leg: No edema.     Left lower leg: No edema.     Comments: Wearing compression stockings  Lymphadenopathy:     Cervical: No cervical adenopathy.  Skin:    General: Skin is warm and dry.     Findings: No rash.  Neurological:     General: No focal deficit present.      Mental Status: He is alert and oriented to person, place, and time.     Comments:  CN grossly intact, station and gait intact Registration - 1/3 Did not test recall Did not test calculation  Psychiatric:        Mood and Affect: Mood normal.        Behavior: Behavior normal.       Results for orders placed or performed in visit on 03/29/19  CBC with Differential/Platelet  Result Value Ref Range   WBC 5.9 4.0 - 10.5 K/uL   RBC 3.69 (L) 4.22 - 5.81 Mil/uL   Hemoglobin 12.1 (L) 13.0 - 17.0 g/dL   HCT 36.1 (L) 39.0 - 52.0 %   MCV 97.7 78.0 - 100.0 fl   MCHC 33.6 30.0 - 36.0 g/dL   RDW 14.4 11.5 - 15.5 %   Platelets 232.0 150.0 - 400.0 K/uL   Neutrophils Relative % 67.5 43.0 - 77.0 %   Lymphocytes Relative 18.5 12.0 - 46.0 %   Monocytes Relative 11.4 3.0 - 12.0 %   Eosinophils Relative 1.7 0.0 - 5.0 %   Basophils Relative 0.9 0.0 - 3.0 %   Neutro Abs 4.0 1.4 - 7.7 K/uL   Lymphs Abs 1.1 0.7 - 4.0 K/uL   Monocytes Absolute 0.7 0.1 - 1.0 K/uL   Eosinophils Absolute 0.1 0.0 - 0.7 K/uL   Basophils Absolute 0.1 0.0 - 0.1 K/uL  TSH  Result Value Ref Range   TSH 2.20 0.35 - 4.50 uIU/mL  Renal function panel  Result Value Ref Range   Sodium 138 135 - 145 mEq/L   Potassium 4.4 3.5 - 5.1 mEq/L   Chloride 102 96 - 112 mEq/L   CO2 29 19 - 32 mEq/L   Calcium 8.7 8.4 - 10.5 mg/dL   Albumin 4.1 3.5 - 5.2 g/dL   BUN 45 (H) 6 - 23 mg/dL   Creatinine, Ser 1.86 (H) 0.40 - 1.50  mg/dL   Glucose, Bld 83 70 - 99 mg/dL   Phosphorus 4.3 2.3 - 4.6 mg/dL   GFR 34.70 (L) >60.00 mL/min   Lab Results  Component Value Date   CHOL 131 04/26/2018   HDL 62.20 04/26/2018   LDLCALC 57 04/26/2018   LDLDIRECT 75.4 05/03/2011   TRIG 60.0 04/26/2018   CHOLHDL 2 04/26/2018    Lab Results  Component Value Date   [id number] 04/26/2018    Assessment & Plan:   Problem List Items Addressed This Visit    REM sleep behavior disorder (Chronic)    H/o this. Consider sleep eval for ongoing insomnia.        PSVT (paroxysmal supraventricular tachycardia) ([place])    H/o this. Was on dilt, but stopped due to ongoing dizziness. Since no noted improvement off dilt, will restart lower dose 120mg  CD daily. Encouraged cards f/u.      Relevant Medications   diltiazem (CARDIZEM CD) 120 MG 24 hr capsule   Medicare annual wellness visit, subsequent - Primary    I have personally reviewed the Medicare Annual Wellness questionnaire and have noted 1. The patient's medical and social history 2. Their use of alcohol, tobacco or illicit drugs 3. Their current medications and supplements 4. The patient's functional ability including ADL's, fall risks, home safety risks and hearing or visual impairment. Cognitive function has been assessed and addressed as indicated.  5. Diet and physical activity 6. Evidence for depression or mood disorders The patients weight, height, BMI have been recorded in the chart. I have made referrals, counseling and provided education to the patient based on review of the above and I have provided the pt with a written personalized care plan for preventive services. Provider list updated.. See scanned questionairre as needed for further documentation. Reviewed preventative protocols and updated unless pt declined.       Hyperlipidemia LDL goal <100 (Chronic)    Update FLP when he returns fasting. Continue lovastatin. The ASCVD Risk score Mikey Bussing DC Jr., et al., 2013) failed to calculate for the following reasons:   The 2013 ASCVD risk score is only valid for ages 71 to 65       Relevant Medications   diltiazem (CARDIZEM CD) 120 MG 24 hr capsule   Other Relevant Orders   Lipid panel   History of colon cancer   Health maintenance examination    Preventative protocols reviewed and updated unless pt declined. Discussed healthy diet and lifestyle.       Essential hypertension (Chronic)    Marked fluctuations. Today in office looking ok. Will await event monitor results.        Relevant Medications   diltiazem (CARDIZEM CD) 120 MG 24 hr capsule   Dizziness    Ongoing despite stopping dilt. Will restart low dose dilt.       CKD (chronic kidney disease) stage 3, GFR 30-59 ml/min (HCC) (Chronic)    Again reviewed with patient. Encouraged good water intake.       Relevant Orders   Renal function panel   VITAMIN D 25 Hydroxy (Vit-D Deficiency, Fractures)   Chronic insomnia    Ongoing struggle despite trazodone and melatonin.       BPH with obstruction/lower urinary tract symptoms    Stable period on proscar. flomax caused am hypotension.       Bilateral hearing loss    Marked, despite hearing aides.  They will call to schedule audiology follow up ASAP.  Cerumen irrigation performed today.  Auditory hallucinations    Longstanding per patient report, not bothersome at this time. Hears many voices singing to him. Noted significant cognitive difficulty as well today. I have asked him to return in 1 month for formal geriatric assessment and MMSE. Will need to eval for dementia with lewy body (autonomic dysfunction, REM sleep disorder, auditory hallucinations)      Atrial fibrillation (HCC) (Chronic)    Continue eliquis, encouraged close cards f/u. He was previously on 240mg  CD dilt, I thought he was only on 120mg . Will restart 120mg  dilt CD.       Relevant Medications   diltiazem (CARDIZEM CD) 120 MG 24 hr capsule   Aortic atherosclerosis (HCC)    Continue statin.      Relevant Medications   diltiazem (CARDIZEM CD) 120 MG 24 hr capsule   Advanced care planning/counseling discussion    Advanced directive discussion - has at home. Wife and son are HCPOA. Asked to bring [place] copy.        Other Visit Diagnoses    Need for influenza vaccination       Relevant Orders   Flu Vaccine QUAD 36+ mos IM (Completed)       Meds ordered this encounter  Medications  . diltiazem (CARDIZEM CD) 120 MG 24 hr capsule    Sig: Take 1 capsule (120 mg total) by  mouth daily.    Dispense:  30 capsule    Refill:  0   Orders Placed This Encounter  Procedures  . Flu Vaccine QUAD 36+ mos IM  . Lipid panel    Standing Status:   Future    Standing Expiration Date:   05/08/2020  . Renal function panel    Standing Status:   Future    Standing Expiration Date:   05/08/2020  . VITAMIN D 25 Hydroxy (Vit-D Deficiency, Fractures)    Standing Status:   Future    Standing Expiration Date:   05/08/2020   Patient instructions: Flu shot today [facility] for fasting [facility] work at [facility]. Restart diltiazem CD 120mg  once daily until you see Dr Rockey Situ.  Check on vaccines at the [place] and let me know.  If interested, check with pharmacy about new 2 shot shingles series (shingrix).  Return to see hearing doctor.  Return in 1 month for memory testing and geriatric assessment.   Follow up plan: Return in about 4 weeks (around 06/05/2019) for follow up visit.  Ria Bush, MD

## 2019-05-09 DIAGNOSIS — R440 Auditory hallucinations: Secondary | ICD-10-CM | POA: Insufficient documentation

## 2019-05-09 NOTE — Assessment & Plan Note (Signed)
Ongoing struggle despite trazodone and melatonin.

## 2019-05-09 NOTE — Assessment & Plan Note (Signed)
Again reviewed with patient. Encouraged good water intake.

## 2019-05-09 NOTE — Assessment & Plan Note (Signed)
Marked fluctuations. Today in office looking ok. Will await event monitor results.

## 2019-05-09 NOTE — Assessment & Plan Note (Addendum)
Longstanding per patient report, not bothersome at this time. Hears many voices singing to him. Noted significant cognitive difficulty as well today. I have asked him to return in 1 month for formal geriatric assessment and MMSE. Will need to eval for dementia with lewy body (autonomic dysfunction, REM sleep disorder, auditory hallucinations)

## 2019-05-09 NOTE — Assessment & Plan Note (Signed)
H/o this. Consider sleep eval for ongoing insomnia.

## 2019-05-09 NOTE — Assessment & Plan Note (Addendum)
Marked, despite hearing aides.  They will call to schedule audiology follow up ASAP.  Cerumen irrigation performed today.

## 2019-05-09 NOTE — Assessment & Plan Note (Signed)
Continue statin. 

## 2019-05-09 NOTE — Assessment & Plan Note (Signed)
Stable period on proscar. flomax caused am hypotension.

## 2019-05-09 NOTE — Assessment & Plan Note (Addendum)
Update FLP when he returns fasting. Continue lovastatin. The ASCVD Risk score Mikey Bussing DC Jr., et al., 2013) failed to calculate for the following reasons:   The 2013 ASCVD risk score is only valid for ages 55 to 53

## 2019-05-09 NOTE — Assessment & Plan Note (Signed)
H/o this. Was on dilt, but stopped due to ongoing dizziness. Since no noted improvement off dilt, will restart lower dose 120mg  CD daily. Encouraged cards f/u.

## 2019-05-09 NOTE — Assessment & Plan Note (Addendum)
Continue eliquis, encouraged close cards f/u. He was previously on 240mg  CD dilt, I thought he was only on 120mg . Will restart 120mg  dilt CD.

## 2019-05-09 NOTE — Assessment & Plan Note (Signed)
Ongoing despite stopping dilt. Will restart low dose dilt.

## 2019-05-09 NOTE — Assessment & Plan Note (Signed)
Advanced directive discussion - has at home. Wife and son are HCPOA. Asked to bring us copy.  

## 2019-05-13 ENCOUNTER — Encounter: Payer: Medicare Other | Admitting: Family Medicine

## 2019-05-15 ENCOUNTER — Encounter: Payer: Self-pay | Admitting: Family Medicine

## 2019-05-15 ENCOUNTER — Emergency Department (HOSPITAL_COMMUNITY)
Admission: EM | Admit: 2019-05-15 | Discharge: 2019-05-16 | Disposition: A | Discharge status: Home / Self Care | Payer: Medicare Other | Attending: Emergency Medicine | Admitting: Emergency Medicine

## 2019-05-15 ENCOUNTER — Other Ambulatory Visit: Payer: Self-pay

## 2019-05-15 ENCOUNTER — Ambulatory Visit (INDEPENDENT_AMBULATORY_CARE_PROVIDER_SITE_OTHER): Payer: Medicare Other | Admitting: Family Medicine

## 2019-05-15 VITALS — BP 120/70 | HR 71 | Temp 98.6°F | Ht 68.5 in | Wt 195.2 lb

## 2019-05-15 DIAGNOSIS — J029 Acute pharyngitis, unspecified: Secondary | ICD-10-CM | POA: Diagnosis not present

## 2019-05-15 DIAGNOSIS — Z85038 Personal history of other malignant neoplasm of large intestine: Secondary | ICD-10-CM | POA: Insufficient documentation

## 2019-05-15 DIAGNOSIS — Z7901 Long term (current) use of anticoagulants: Secondary | ICD-10-CM | POA: Diagnosis not present

## 2019-05-15 DIAGNOSIS — J36 Peritonsillar abscess: Secondary | ICD-10-CM | POA: Insufficient documentation

## 2019-05-15 DIAGNOSIS — Z20828 Contact with and (suspected) exposure to other viral communicable diseases: Secondary | ICD-10-CM | POA: Diagnosis not present

## 2019-05-15 DIAGNOSIS — N183 Chronic kidney disease, stage 3 (moderate): Secondary | ICD-10-CM | POA: Diagnosis not present

## 2019-05-15 DIAGNOSIS — H9193 Unspecified hearing loss, bilateral: Secondary | ICD-10-CM

## 2019-05-15 DIAGNOSIS — I129 Hypertensive chronic kidney disease with stage 1 through stage 4 chronic kidney disease, or unspecified chronic kidney disease: Secondary | ICD-10-CM | POA: Insufficient documentation

## 2019-05-15 DIAGNOSIS — I480 Paroxysmal atrial fibrillation: Secondary | ICD-10-CM

## 2019-05-15 DIAGNOSIS — Z79899 Other long term (current) drug therapy: Secondary | ICD-10-CM | POA: Diagnosis not present

## 2019-05-15 HISTORY — DX: Peritonsillar abscess: J36

## 2019-05-15 LAB — CBC WITH DIFFERENTIAL/PLATELET
Abs Immature Granulocytes: 0.01 10*3/uL (ref 0.00–0.07)
Basophils Absolute: 0 10*3/uL (ref 0.0–0.1)
Basophils Relative: 0 %
Eosinophils Absolute: 0.1 10*3/uL (ref 0.0–0.5)
Eosinophils Relative: 1 %
HCT: 38.2 % — ABNORMAL LOW (ref 39.0–52.0)
Hemoglobin: 13 g/dL (ref 13.0–17.0)
Immature Granulocytes: 0 %
Lymphocytes Relative: 17 %
Lymphs Abs: 1.2 10*3/uL (ref 0.7–4.0)
MCH: 33.9 pg (ref 26.0–34.0)
MCHC: 34 g/dL (ref 30.0–36.0)
MCV: 99.5 fL (ref 80.0–100.0)
Monocytes Absolute: 0.8 10*3/uL (ref 0.1–1.0)
Monocytes Relative: 12 %
Neutro Abs: 4.6 10*3/uL (ref 1.7–7.7)
Neutrophils Relative %: 70 %
Platelets: 248 10*3/uL (ref 150–400)
RBC: 3.84 MIL/uL — ABNORMAL LOW (ref 4.22–5.81)
RDW: 13.1 % (ref 11.5–15.5)
WBC: 6.7 10*3/uL (ref 4.0–10.5)
nRBC: 0 % (ref 0.0–0.2)

## 2019-05-15 LAB — COMPREHENSIVE METABOLIC PANEL
ALT: 22 U/L (ref 0–44)
AST: 33 U/L (ref 15–41)
Albumin: 3.7 g/dL (ref 3.5–5.0)
Alkaline Phosphatase: 55 U/L (ref 38–126)
Anion gap: 10 (ref 5–15)
BUN: 25 mg/dL — ABNORMAL HIGH (ref 8–23)
CO2: 28 mmol/L (ref 22–32)
Calcium: 9 mg/dL (ref 8.9–10.3)
Chloride: 99 mmol/L (ref 98–111)
Creatinine, Ser: 1.54 mg/dL — ABNORMAL HIGH (ref 0.61–1.24)
GFR calc Af Amer: 47 mL/min — ABNORMAL LOW (ref >60)
GFR calc non Af Amer: 41 mL/min — ABNORMAL LOW (ref >60)
Glucose, Bld: 97 mg/dL (ref 70–99)
Potassium: 3.8 mmol/L (ref 3.5–5.1)
Sodium: 137 mmol/L (ref 135–145)
Total Bilirubin: 1 mg/dL (ref 0.3–1.2)
Total Protein: 7 g/dL (ref 6.5–8.1)

## 2019-05-15 NOTE — Progress Notes (Addendum)
This visit was conducted in person.  BP 120/70 (BP Location: Left Arm, Patient Position: Sitting, Cuff Size: Normal)   Pulse 71   Temp 98.6 F (37 C) (Temporal)   Ht 5' 8.5" (1.74 m)   Wt 195 lb 3 oz (88.5 kg)   SpO2 96%   BMI 29.25 kg/m    CC: ST Subjective:    Patient ID: Michael [name], male    DOB: 06/16/1932, 85 y.o.   MRN: [id number]  HPI: Michael [name] is a 85 y.o. male presenting on 05/15/2019 for Sore Throat (C/o severe sore throat on left side and pain swallowing.  Started 3-4 days ago. Thinks it started from a canker sore. Has had some mucous come up. Pt accompanined by wife, Festus Holts. )   Tolerating diltiazem well - BP running better.   L side sore throat present for 3-4 days, attributes to new canker sore. Acutely worse last night. Painful to swallow. Has been treating with chloraseptic throat spray and warm salt water gargles, tea with honey with limited benefit. Has never had sore like this in the past. No fever or cough. No tooth pain.      Relevant past medical, surgical, family and social history reviewed and updated as indicated. Interim medical history since our last visit reviewed. Allergies and medications reviewed and updated. Outpatient Medications Prior to Visit  Medication Sig Dispense Refill  . apixaban (ELIQUIS) 5 MG TABS tablet Take 5 mg by mouth 2 (two) times daily.    . Calcium Carb-Cholecalciferol (CALCIUM+D3 PO) Take 1 tablet by mouth daily.    Marland Kitchen diltiazem (CARDIZEM CD) 120 MG 24 hr capsule Take 1 capsule (120 mg total) by mouth daily. 30 capsule 0  . flecainide (TAMBOCOR) 100 MG tablet TAKE 1 TABLET BY MOUTH TWICE A DAY 180 tablet 3  . gabapentin (NEURONTIN) 100 MG capsule Take 100 mg by mouth at bedtime.    . lovastatin (MEVACOR) 20 MG tablet TAKE 1 TABLET BY MOUTH ONCE DAILY 90 tablet 2  . Melatonin 10 MG CAPS Take 1 capsule by mouth at bedtime. 30 capsule   . Multiple Vitamins-Minerals (CENTRUM SILVER ADULT 50+ PO) Take 1 tablet by mouth daily.     Marland Kitchen omeprazole (PRILOSEC) 20 MG capsule TAKE 1 CAPSULE BY MOUTH EVERY DAY AS NEEDED. 90 capsule 3  . traZODone (DESYREL) 100 MG tablet 1 to 2 tablets at night as needed 30 mins before bed 60 tablet 5  . triamcinolone cream (KENALOG) 0.1 % Apply 1 application topically as needed.    . finasteride (PROSCAR) 5 MG tablet Take 1 tablet (5 mg total) by mouth daily. (Patient not taking: Reported on 05/15/2019) 30 tablet 6   No facility-administered medications prior to visit.      Per HPI unless specifically indicated in ROS section below Review of Systems Objective:    BP 120/70 (BP Location: Left Arm, Patient Position: Sitting, Cuff Size: Normal)   Pulse 71   Temp 98.6 F (37 C) (Temporal)   Ht 5' 8.5" (1.74 m)   Wt 195 lb 3 oz (88.5 kg)   SpO2 96%   BMI 29.25 kg/m   Wt Readings from Last 3 Encounters:  05/15/19 195 lb 3 oz (88.5 kg)  05/08/19 199 lb 2 oz (90.3 kg)  03/28/19 200 lb 2 oz (90.8 kg)    Physical Exam Vitals signs and nursing note reviewed.  Constitutional:      General: He is not in acute distress.    Appearance:  Normal appearance. He is not ill-appearing.  HENT:     Head: Normocephalic.     Right Ear: Tympanic membrane, ear canal and external ear normal. Decreased hearing noted. There is no impacted cerumen.     Left Ear: Tympanic membrane, ear canal and external ear normal. Decreased hearing noted. There is no impacted cerumen.     Nose: Nose normal. No congestion.     Mouth/Throat:     Lips: Pink.     Mouth: Mucous membranes are moist.     Tongue: No lesions.     Tonsils: Tonsillar abscess present. No tonsillar exudate.      Comments: L peritonsillar swelling erythema and tenderness with fluctuance, some uvular deviation to right Eyes:     General: No scleral icterus.    Extraocular Movements: Extraocular movements intact.     Pupils: Pupils are equal, round, and reactive to light.  Neck:     Musculoskeletal: Normal range of motion and neck supple.   Lymphadenopathy:     Cervical: No cervical adenopathy.  Neurological:     Mental Status: He is alert.  Psychiatric:        Mood and Affect: Mood normal.        Behavior: Behavior normal.       Results for orders placed or performed in visit on 03/29/19  CBC with Differential/Platelet  Result Value Ref Range   WBC 5.9 4.0 - 10.5 K/uL   RBC 3.69 (L) 4.22 - 5.81 Mil/uL   Hemoglobin 12.1 (L) 13.0 - 17.0 g/dL   HCT 36.1 (L) 39.0 - 52.0 %   MCV 97.7 78.0 - 100.0 fl   MCHC 33.6 30.0 - 36.0 g/dL   RDW 14.4 11.5 - 15.5 %   Platelets 232.0 150.0 - 400.0 K/uL   Neutrophils Relative % 67.5 43.0 - 77.0 %   Lymphocytes Relative 18.5 12.0 - 46.0 %   Monocytes Relative 11.4 3.0 - 12.0 %   Eosinophils Relative 1.7 0.0 - 5.0 %   Basophils Relative 0.9 0.0 - 3.0 %   Neutro Abs 4.0 1.4 - 7.7 K/uL   Lymphs Abs 1.1 0.7 - 4.0 K/uL   Monocytes Absolute 0.7 0.1 - 1.0 K/uL   Eosinophils Absolute 0.1 0.0 - 0.7 K/uL   Basophils Absolute 0.1 0.0 - 0.1 K/uL  TSH  Result Value Ref Range   TSH 2.20 0.35 - 4.50 uIU/mL  Renal function panel  Result Value Ref Range   Sodium 138 135 - 145 mEq/L   Potassium 4.4 3.5 - 5.1 mEq/L   Chloride 102 96 - 112 mEq/L   CO2 29 19 - 32 mEq/L   Calcium 8.7 8.4 - 10.5 mg/dL   Albumin 4.1 3.5 - 5.2 g/dL   BUN 45 (H) 6 - 23 mg/dL   Creatinine, Ser 1.86 (H) 0.40 - 1.50 mg/dL   Glucose, Bld 83 70 - 99 mg/dL   Phosphorus 4.3 2.3 - 4.6 mg/dL   GFR 34.70 (L) >60.00 mL/min   Assessment & Plan:   Problem List Items Addressed This Visit    Peritonsillar abscess determined by examination - Primary    Concern for this - discussed with patient and wife. Although he is largely stable (afebrile, breathing unlabored, vitals stable), given concerning throat exam findings I did recommend ER evaluation today to eval for further management (possible aspiration or IV abx). Pt and wife agree with plan.       Bilateral hearing loss   Atrial fibrillation (HCC) (Chronic)  Tolerating  restarting dilt CD at 120mg , continues eliquis and flecainide          No orders of the defined types were placed in this encounter.  No orders of the defined types were placed in this encounter.   Follow up plan: Return if symptoms worsen or fail to improve.  Ria Bush, MD

## 2019-05-15 NOTE — Patient Instructions (Signed)
I worry you have peritonsillar cellulitis and possible abscess.  I recommend going to ER for further treatment.  We will call ER to let them know you are on your way.   Peritonsillar Abscess A peritonsillar abscess is an infected area in your throat that is filled with pus. It forms behind your tonsils. This may be treated by:  Draining the pus. Your doctor may do this with a syringe and a needle (needle aspiration) or by making a cut in the abscess.  Using antibiotic medicine. Follow these instructions at home: Medicines  Take over-the-counter and prescription medicines only as told by your doctor.  If you were prescribed an antibiotic, take it as told by your doctor. Do not stop taking the antibiotic even if you start to feel better. Eating and drinking   Drink enough fluid to keep your pee (urine) pale yellow.  While your throat is sore, try one of these: ? Only drinking liquids. ? Eating only soft foods, such as yogurt and ice cream. General instructions  Rest as much as you can. Get plenty of sleep.  Return to your normal activities as told by your doctor. Ask your doctor what activities are safe for you.  If your abscess was drained, gargle with a salt-water mixture 3-4 times a day or as needed. ? To make a salt-water mixture, completely dissolve -1 tsp of salt in 1 cup of warm water. ? Do not swallow this mixture.  Do not use any products that have nicotine or tobacco in them. These include cigarettes and e-cigarettes. If you need help quitting, ask your doctor.  Keep all follow-up visits as told by your doctor. This is important. Contact a doctor if you have:  More pain, swelling, redness, or pus in your throat.  A headache.  Low energy (lethargy).  A general feeling of illness (malaise).  A fever.  Dizziness.  Trouble swallowing.  Trouble eating.  Signs of body fluid loss (dehydration), such as: ? Feeling light-headed when you are standing. ? Peeing  (urinating) less than usual. ? A fast heart rate. ? Dry mouth. Get help right away if you:  Have trouble talking.  Have trouble breathing.  Breathing is easier when you lean forward.  Cough up blood.  Throw up (vomit) blood.  Have very bad throat pain and it does not get better with medicine. Summary  A peritonsillar abscess is an infected area in your throat that is filled with pus.  You may be treated by having the abscess drained and by taking antibiotic medicine.  Contact a doctor if you have trouble swallowing or eating.  Get help right away if you cough up blood or see blood when you throw up (vomit). This information is not intended to replace advice given to you by your health care provider. Make sure you discuss any questions you have with your health care provider. Document Released: 08/17/2009 Document Revised: 08/11/2017 Document Reviewed: 07/11/2017 Elsevier Patient Education  2020 [employer].

## 2019-05-15 NOTE — Assessment & Plan Note (Signed)
Tolerating restarting dilt CD at 120mg , continues eliquis and flecainide

## 2019-05-15 NOTE — ED Notes (Signed)
Pt called x3 in the waiting room. No reply. 

## 2019-05-15 NOTE — Assessment & Plan Note (Addendum)
Concern for this - discussed with patient and wife. Although he is largely stable (afebrile, breathing unlabored, vitals stable), given concerning throat exam findings I did recommend ER evaluation today to eval for further management (possible aspiration or IV abx). Pt and wife agree with plan.

## 2019-05-15 NOTE — ED Triage Notes (Signed)
Pt sent from Dr. Danise Mina office for possible peritonsillar abscess.  Sore throat started 3 days ago.  No respiratory or swallowing difficulties.

## 2019-05-16 ENCOUNTER — Emergency Department (HOSPITAL_COMMUNITY): Payer: Medicare Other

## 2019-05-16 ENCOUNTER — Encounter (HOSPITAL_COMMUNITY): Payer: Self-pay | Admitting: Radiology

## 2019-05-16 DIAGNOSIS — J029 Acute pharyngitis, unspecified: Secondary | ICD-10-CM | POA: Diagnosis not present

## 2019-05-16 DIAGNOSIS — J36 Peritonsillar abscess: Secondary | ICD-10-CM | POA: Insufficient documentation

## 2019-05-16 DIAGNOSIS — J343 Hypertrophy of nasal turbinates: Secondary | ICD-10-CM | POA: Diagnosis not present

## 2019-05-16 DIAGNOSIS — Z7289 Other problems related to lifestyle: Secondary | ICD-10-CM | POA: Diagnosis not present

## 2019-05-16 HISTORY — DX: Peritonsillar abscess: J36

## 2019-05-16 LAB — SARS CORONAVIRUS 2 BY RT PCR (HOSPITAL ORDER, PERFORMED IN ~~LOC~~ HOSPITAL LAB): SARS Coronavirus 2: NEGATIVE

## 2019-05-16 MED ORDER — CLINDAMYCIN PHOSPHATE 600 MG/50ML IV SOLN
600.0000 mg | Freq: Once | INTRAVENOUS | Status: AC
  Administered 2019-05-16: 02:00:00 600 mg via INTRAVENOUS
  Filled 2019-05-16: qty 50

## 2019-05-16 MED ORDER — CLINDAMYCIN HCL 150 MG PO CAPS: 300.0000 mg | ORAL_CAPSULE | Freq: Four times a day (QID) | ORAL | 0 refills | Status: DC

## 2019-05-16 MED ORDER — IOHEXOL 300 MG/ML  SOLN
75.0000 mL | Freq: Once | INTRAMUSCULAR | Status: AC | PRN
  Administered 2019-05-16: 75 mL via INTRAVENOUS

## 2019-05-16 MED ORDER — DEXAMETHASONE SODIUM PHOSPHATE 10 MG/ML IJ SOLN
10.0000 mg | Freq: Once | INTRAMUSCULAR | Status: AC
  Administered 2019-05-16: 02:00:00 10 mg via INTRAVENOUS
  Filled 2019-05-16: qty 1

## 2019-05-16 NOTE — ED Provider Notes (Signed)
[facility] EMERGENCY DEPARTMENT Provider Note   CSN: [id number] Arrival date & time: 05/15/19  1649     History   Chief Complaint Chief Complaint  Patient presents with   Sore Throat    HPI Michael [name] is a 85 y.o. male.     The history is provided by the patient and medical records. No language interpreter was used.  Sore Throat   Michael [name] is a 85 y.o. male  with a PMH as listed below who presents to the Emergency Department by recommendation of PCP for left-sided sore throat. PCP was concerned for peritonsillar abscess. Patient reports sore throat x4 days, but acute worsening last night / this morning. Had difficulty sleeping due to the pain. Feels like he is having some swelling to the left side of his neck today. Had pain with swallowing but has been able to tolerate PO and handle secretions fine. No cough, congestion, fever, shortness of breath. Denies hx of similar.    Past Medical History:  Diagnosis Date   Arthritis    Aspiration pneumonia (HCC)    BPH (benign prostatic hypertrophy)    Chronic back pain    CKD (chronic kidney disease), stage III (HCC)    Colon cancer ([place]) 1988   Surgery alone, no history of chemo   Diverticulosis    Essential hypertension    GERD (gastroesophageal reflux disease)    H/O calcium pyrophosphate deposition disease (CPPD)    Hiatal hernia 01/06/2015   HLD (hyperlipidemia)    HOH (hard of hearing)    bilateral hearing aids   Impotence of organic origin    PSVT (paroxysmal supraventricular tachycardia) ([place])    a. 04/2014 Event Monitor: Freq bouts of SVT with rates to 180-->seen by EP with recommendation for RFCA, pt deferred;  b. 05/2014 Echo: EF nl, mild conc LVH, no rwma, Gr1 DD, mildMR/TR.   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   Rotator cuff rupture    Right; s/p repair   Unspecified hearing loss    Ventral hernia 1988    Patient Active Problem List   Diagnosis Date Noted    Peritonsillar abscess determined by examination 05/15/2019   Auditory hallucinations 05/09/2019   Medicare annual wellness visit, subsequent 05/08/2019   Health maintenance examination 05/08/2019   Advanced care planning/counseling discussion 05/08/2019   Chronic insomnia 02/08/2019   Urinary frequency 02/01/2019   GERD (gastroesophageal reflux disease) 10/16/2018   Atrial fibrillation ([place]) 07/27/2017   Aortic atherosclerosis ([place]) 11/04/2016   Coronary artery disease involving native coronary artery of native heart without angina pectoris 11/04/2016   PSVT (paroxysmal supraventricular tachycardia) ([place])    Hiatal hernia 01/06/2015   Hemoptysis 01/03/2015   SVT (supraventricular tachycardia) ([place]) 04/18/2014   Dizziness 02/21/2014   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 Georgette Dover, MD;  Location: [facility];  Service: General;  Laterality: Left;   ROTATOR CUFF REPAIR Right     [facility]   ROTATOR CUFF REPAIR Right    TOTAL KNEE ARTHROPLASTY  03/05/2012   Procedure: TOTAL KNEE ARTHROPLASTY;  Surgeon: Gearlean Alf, MD;  Location: [facility];  Service: Orthopedics;  Laterality: Left;   [place] Surgery, x 4   VEIN LIGATION AND STRIPPING Right         Home Medications    Prior  to Admission medications   Medication Sig Start Date End Date Taking? Authorizing Provider  apixaban (ELIQUIS) 5 MG TABS tablet Take 5 mg by mouth 2 (two) times daily.    [provider]  Calcium Carb-Cholecalciferol (CALCIUM+D3 PO) Take 1 tablet by mouth daily.    [provider]  clindamycin (CLEOCIN) 150 MG capsule Take 2 capsules (300 mg total) by mouth 4 (four) times daily. 05/16/19   Kaitlynd Phillips, Ozella Almond, PA-C  diltiazem (CARDIZEM CD) 120 MG 24 hr capsule Take 1 capsule (120 mg total) by mouth daily. 05/08/19   Ria Bush, MD  finasteride (PROSCAR) 5 MG tablet Take 1 tablet (5 mg total) by mouth daily. Patient not taking: Reported on 05/15/2019 02/08/19   Ria Bush, MD  flecainide (TAMBOCOR) 100 MG tablet TAKE 1 TABLET BY MOUTH TWICE A DAY 10/10/16   Minna Merritts, MD  gabapentin (NEURONTIN) 100 MG capsule Take 100 mg by mouth at bedtime.    [provider]  lovastatin (MEVACOR) 20 MG tablet TAKE 1 TABLET BY MOUTH ONCE DAILY 03/06/17   Copland, Frederico Hamman, MD  Melatonin 10 MG CAPS Take 1 capsule by mouth at bedtime. 02/08/19   Ria Bush, MD  Multiple Vitamins-Minerals (CENTRUM SILVER ADULT 50+ PO) Take 1 tablet by mouth daily.    [provider]  omeprazole (PRILOSEC) 20 MG capsule TAKE 1 CAPSULE BY MOUTH EVERY DAY AS NEEDED. 11/07/18   Copland, Frederico Hamman, MD  traZODone (DESYREL) 100 MG tablet 1 to 2 tablets at night as needed 30 mins before bed 03/29/18   Copland, Spencer, MD  triamcinolone cream (KENALOG) 0.1 % Apply 1 application topically as needed.    [provider]    Family History Family History  Problem Relation Age of Onset   Osteoarthritis Father    Congestive Heart Failure Mother     Social History Social History   Tobacco Use   Smoking status: Never Smoker   Smokeless tobacco: Never Used  Substance Use Topics   Alcohol use: Yes    Alcohol/week: 1.0 standard drinks    Types: 1 Glasses of wine per week     Comment: Wine (1/2-1 glass 2x/week)   Drug use: No     Allergies   Patient has no known allergies.   Review of Systems Review of Systems  HENT: Positive for sore throat.   All other systems reviewed and are negative.    Physical Exam Updated Vital Signs BP (!) 146/85    Pulse 70    Temp 98.6 F (37 C) (Oral)    Resp 16    SpO2 94%   Physical Exam Vitals signs and nursing note reviewed.  Constitutional:      General: He is not in acute distress.    Appearance: He is well-developed.  HENT:     Head: Normocephalic and atraumatic.     Mouth/Throat:     Comments: Left peritonsillar swelling, erythema and tenderness with fluctuance, some uvular deviation to right. Neck:  Musculoskeletal: Neck supple.  Cardiovascular:     Rate and Rhythm: Normal rate and regular rhythm.     Heart sounds: Normal heart sounds. No murmur.  Pulmonary:     Effort: Pulmonary effort is normal. No respiratory distress.     Breath sounds: Normal breath sounds.  Abdominal:     General: There is no distension.     Palpations: Abdomen is soft.     Tenderness: There is no abdominal tenderness.  Skin:    General: Skin is warm and dry.  Neurological:     Mental Status: He is alert and oriented to person, place, and time.      ED Treatments / Results  Labs (all labs ordered are listed, but only abnormal results are displayed) Labs Reviewed  CBC WITH DIFFERENTIAL/PLATELET - Abnormal; Notable for the following components:      Result Value   RBC 3.84 (*)    HCT 38.2 (*)    All other components within normal limits  COMPREHENSIVE METABOLIC PANEL - Abnormal; Notable for the following components:   BUN 25 (*)    Creatinine, Ser 1.54 (*)    GFR calc non Af Amer 41 (*)    GFR calc Af Amer 47 (*)    All other components within normal limits  SARS CORONAVIRUS 2 (HOSPITAL ORDER, [facility])    EKG None  Radiology Ct Soft Tissue Neck W Contrast  Result Date:  05/16/2019 CLINICAL DATA:  Initial evaluation for acute sore throat for 3 days. EXAM: CT NECK WITH CONTRAST TECHNIQUE: Multidetector CT imaging of the neck was performed using the standard protocol following the bolus administration of intravenous contrast. CONTRAST:  63mL OMNIPAQUE IOHEXOL 300 MG/ML  SOLN COMPARISON:  None. FINDINGS: Pharynx and larynx: Oral cavity within normal limits without discrete mass or collection. No acute abnormality about the dentition. Left palatine tonsil is enlarged and hyperenhancing, suspicious for acute tonsillitis given provided history. Superimposed rim enhancing hypodense lesion measuring approximately 1.7 x 1.7 x 2.1 cm concerning for tonsillar/peritonsillar abscess (series 3, image 41). Please note that an underlying neoplastic process and/or mass could also have this appearance. Associated hazy inflammatory stranding within the adjacent left parapharyngeal space. Associated mucosal thickening within the adjacent left oropharynx extending inferiorly along the left hypopharynx. No significant retropharyngeal effusion. Epiglottis within normal limits. Remainder of the hypopharynx and supraglottic larynx within normal limits. True cords symmetric and normal. Subglottic airway clear. Salivary glands: Salivary glands including the parotid and submandibular glands demonstrate no acute finding. Few punctate calcified sialoliths noted within the parotid glands bilaterally. Thyroid: Thyroid within normal limits. Lymph nodes: No pathologically enlarged lymph nodes identified within the neck. Vascular: Normal intravascular enhancement seen throughout the neck. Moderate atherosclerotic change about the aortic arch, carotid bifurcations, as well as the carotid siphons. Limited intracranial: Unremarkable. Visualized orbits: Visualized globes and orbital soft tissues within normal limits. Patient status post bilateral ocular lens replacement. Mastoids and visualized paranasal sinuses:  Visualized paranasal sinuses are clear. Mastoid air cells and middle ear cavities are well pneumatized and free of fluid. Skeleton: No acute osseous abnormality. No discrete lytic or blastic osseous lesions. Moderate multilevel cervical spondylolysis, most notable at C5-6. Upper chest: Visualized upper chest demonstrates no acute finding. Note made of a approximate 1 cm well-circumscribed soft tissue lesion at the right posterior aspect of the upper trachea (series 3, image 102), indeterminate, but grossly stable relative to prior chest CT from 2017, most likely benign. Probable small bursal collection  noted adjacent to the left shoulder. Mild scattered atelectatic changes noted within the visualized lungs. Other: Incidental note made of a retained metallic BB within the left neck at the inferior margin of the left parotid gland (series 3, image 58). IMPRESSION: 1. Findings consistent with acute left-sided tonsillitis and pharyngitis, with superimposed 1.7 x 1.7 x 2.1 cm tonsillar/peritonsillar abscess as above. Correlation with direct visualization and clinical follow-up to resolution recommended, as an underlying neoplastic process/mass could also have this appearance. 2. No other acute abnormality within the neck. Chronic incidental findings as above. 3. Moderate atherosclerosis. Electronically Signed   By: Jeannine Boga M.D.   On: 05/16/2019 02:49    Procedures Procedures (including critical care time)  Medications Ordered in ED Medications  clindamycin (CLEOCIN) IVPB 600 mg (0 mg Intravenous Stopped 05/16/19 0223)  dexamethasone (DECADRON) injection 10 mg (10 mg Intravenous Given 05/16/19 0144)  iohexol (OMNIPAQUE) 300 MG/ML solution 75 mL (75 mLs Intravenous Contrast Given 05/16/19 0217)     Initial Impression / Assessment and Plan / ED Course  I have reviewed the triage vital signs and the nursing notes.  Pertinent labs & imaging results that were available during my care of the patient were  reviewed by me and considered in my medical decision making (see chart for details).       Michael [name] is a 85 y.o. male who presents to ED from PCP for further evaluation of left-sided sore throat. Chart reviewed from PCP visit. Appears he was concerned for PTA.  On exam today, patient is afebrile, tolerating secretions and p.o. intake fine.  He does have left tonsillar hypertrophy with swelling to the soft palate and slight uvula deviation concerning for peritonsillar abscess. Will obtain CT to further evaluate. Labs reviewed and reassuring. Normal white count. CT shows findings c/w left-sided tonsillitis and pharyngitis with superimposed tonsillar/peritonsillar abscess.  Discussed case with on-call ENT, Dr. Blenda Nicely, who recommends COVID test today, close follow-up in clinic tomorrow and prescription for clindamycin.  He was given dose of clindamycin IV as well as Decadron in ED. plan of care discussed with patient and his wife at bedside.  They are in agreement.  Just prior to discharge, patient was reevaluated and is still tolerating p.o. fine.  No concerns for airway compromise.  Reasons to return to the emergency department were discussed with patient and wife and all questions were answered.  Patient seen by and discussed with Dr. Dayna Barker who agrees with treatment plan.    Final Clinical Impressions(s) / ED Diagnoses   Final diagnoses:  Peritonsillar abscess    ED Discharge Orders         Ordered    clindamycin (CLEOCIN) 150 MG capsule  4 times daily     05/16/19 0308           Adynn Caseres, Ozella Almond, PA-C 05/16/19 0351    Mesner, Corene Cornea, MD 05/16/19 [phone]

## 2019-05-16 NOTE — ED Provider Notes (Signed)
Medical screening examination/treatment/procedure(s) were conducted as a shared visit with non-physician practitioner(s) and myself.  I personally evaluated the patient during the encounter.  Left sided throat swelling, pain with swallowing worsened over last day. No fevers. Does have muffled voice according to wife.  On exam appears to have swelling on left soft palate without visible abscess. Plan for ct to eval for abscess vs phlegmon vs other causes. dispo pending for same.         Gabrial Poppell, Corene Cornea, MD 05/16/19 [phone]

## 2019-05-16 NOTE — ED Notes (Signed)
Patient verbalizes understanding of discharge instructions. Opportunity for questioning and answers were provided. Armband removed by staff, pt discharged from ED.  

## 2019-05-16 NOTE — ED Notes (Signed)
Unable to get iv access.  Will put order for iv team consult

## 2019-05-16 NOTE — Discharge Instructions (Signed)
It was my pleasure taking care of you today!   Please take all of your antibiotics until finished!  Please call the ENT doctor listed tomorrow to schedule a follow up appointment. Dr. Blenda Nicely recommended that you be seen tomorrow.   Return to ER immediately for any difficulty with swallowing or breathing, fevers, new or worsening symptoms or any additional concerns.

## 2019-05-24 ENCOUNTER — Telehealth: Payer: Self-pay

## 2019-05-24 ENCOUNTER — Telehealth: Payer: Self-pay | Admitting: Cardiovascular Disease

## 2019-05-24 NOTE — Telephone Encounter (Signed)
Pt finished abx 05/23/19 for throat infection; now BP 90/56 P 147 rechecked BP with same results x 2. T 96.1. pt is dizzy or lightheaded, room is not spinning,pt is nauseated. No CP,SOB,H/A, extremity weakness or vision changes. Pt does not want to go to ED due to wait time the last time pt was in Ed; DrG said pt needed to be eval at ED with BP that low and heart rate that high. pts wife voiced understanding and will take pt to [facility] ED now; Mrs Rittenhouse sounded nervous on phone so I offered to call 911. Mrs Ballek said she was nervous but she could get him to ED. FYI to Dr Darnell Level.

## 2019-05-24 NOTE — Telephone Encounter (Signed)
Called and spoke with patient and wife.  Patient had episode this morning where his BP dropped to 90/56, HR to 140-147. It was accompanied with nausea and dizziness. He called his PCP who advised patient to go to the ED.  They did not go because last week he was there for peritonsillar abscess and waited 11 hours in the ED. He had it drained and finished his round of antibiotic yesterday.  Since the episode this morning, patient has been drinking water and eating something light with no problem. No he denies any dizziness, chest pain, nausea or shortness of breath. More recent BP: 1040 144/100, 79 1230 115/71, 72 1300 129/80, 75  Patient has appointment with Dr Rockey Situ on 05/28/19. Advised him to keep this appointment and if symptoms return or worsen over the weekend, he should call 911 or go to the ER. Wife and patient verbalized understanding.

## 2019-05-24 NOTE — Telephone Encounter (Signed)
Pt c/o BP issue: STAT if pt c/o blurred vision, one-sided weakness or slurred speech  1. What are your last 5 BP readings? 32/56  140/82   Hr today changing from 140-73  2. Are you having any other symptoms (ex. Dizziness, headache, blurred vision, passed out)? Dizzy weakness drank water and symptoms improved somewhat .  Talked to pcp and they said go to ED but they dont want to wait for hours again   3. What is your BP issue?  Wife calling and wants advice to avoid going to the ED .  Please call.

## 2019-05-26 NOTE — Progress Notes (Signed)
Cardiology Office Note  Date:  05/28/2019   ID:  Michael [name], Michael [name] [birth year], MRN [id number]  PCP:  Michael Bush, MD   Chief Complaint  Patient presents with  . other    OD 6 month f/u last seen 04/2017 c/o BP issues. Meds reviewed verbally with pt.    HPI:  [name] is a pleasant 85 year old gentleman with history of Three-vessel CAD on CT scan Moderate diffuse aortic athero  total knee replacement,  renal dysfunction,  SVT previously seen by Dr. Rayann Heman and plan was made for SVT ablation. in the end he changed his mind and canceled the procedure He presents today for follow-up of his SVT and CAD  He returns today, last seen by myself 2 years ago Wife presents with him today On her last clinic visit 2018 he was on diltiazem 120 mg daily Wife concerned as it was increased up to 240 possibly by the [facility] hospital He was having low blood pressure, preferred to be on 120 daily  He has been monitoring heart rate blood pressure at home Likely with SVT on 05/24/2019, 3 hours,  rate 140, with associated hypotension  Previously declined ablation Today not interested in ablation Prefers to do with medication  Put on eliquis by cardiology with the [place] (per the wife), unclear reasons  Lab work reviewed with him Total chol 143, LDL 65  EKG on today's visit shows normal sinus rhythm with rate 59 beats a minute, no significant ST or T-wave changes  Other past medical history reviewed CT scan September 2017 for pneumonia reviewed with him in detail extensive coronary calcifications, all 3 coronary vessels , at least moderate diffuse aortic atherosclerosis  Previous 30 day monitor  showed runs of SVT, heart rate up to 177 beats per minute associated with dizziness. 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  PSH:    Past Surgical History:  Procedure Laterality Date  . EYE SURGERY Bilateral    cataract extraction with IOL bilaterally  . [place]  . INGUINAL HERNIA REPAIR Right 1963  . MASTECTOMY, PARTIAL Left 02/07/2013   Procedure: LEFT SUBCUTANEOUS MASTECTOMY;  Surgeon: Imogene Burn. Georgette Dover, MD;  Location: [facility];  Service: General;  Laterality: Left;  . ROTATOR CUFF REPAIR Right     [facility]  . ROTATOR CUFF REPAIR Right   . TOTAL KNEE ARTHROPLASTY  03/05/2012   Procedure: TOTAL KNEE ARTHROPLASTY;  Surgeon: Gearlean Alf, MD;  Location: [facility];  Service: Orthopedics;  Laterality: Left;  . [place] Surgery, x 4  . VEIN LIGATION AND STRIPPING Right     Current Outpatient Medications  Medication Sig Dispense Refill  . apixaban (ELIQUIS) 5 MG TABS tablet Take 5 mg by mouth 2 (two) times daily.    . Calcium Carb-Cholecalciferol (CALCIUM+D3 PO) Take 1 tablet by mouth daily.    Marland Kitchen diltiazem (CARDIZEM CD) 120 MG 24 hr capsule Take 1  capsule (120 mg total) by mouth daily. 30 capsule 0  . flecainide (TAMBOCOR) 100 MG tablet TAKE 1 TABLET BY MOUTH TWICE A DAY 180 tablet 3  . gabapentin (NEURONTIN) 100 MG capsule Take 100 mg by mouth as needed.     . lovastatin (MEVACOR) 20 MG tablet TAKE 1 TABLET BY MOUTH ONCE DAILY 90 tablet 2  . Melatonin 10 MG CAPS Take 1 capsule by mouth at bedtime. 30 capsule   . Multiple Vitamins-Minerals (CENTRUM SILVER ADULT 50+ PO)  Take 1 tablet by mouth daily.    Marland Kitchen omeprazole (PRILOSEC) 20 MG capsule TAKE 1 CAPSULE BY MOUTH EVERY DAY AS NEEDED. 90 capsule 3  . oxyCODONE-acetaminophen (PERCOCET/ROXICET) 5-325 MG tablet Take 1 tablet by mouth daily as needed for severe pain.    . traZODone (DESYREL) 100 MG tablet 1 to 2 tablets at night as needed 30 mins before bed 60 tablet 5  . triamcinolone cream (KENALOG) 0.1 % Apply 1 application topically as needed.     No current facility-administered medications for this visit.      Allergies:   Patient has no known allergies.   Social History:  The patient  reports that he has never smoked. He has never used smokeless tobacco. He reports current alcohol use of about 1.0 standard drinks of alcohol per week. He reports that he does not use drugs.   Family History:   family history includes Congestive Heart Failure in his mother; Osteoarthritis in his father.    Review of Systems: Review of Systems  Respiratory: Negative.   Cardiovascular: Negative.   Gastrointestinal: Negative.   Musculoskeletal: Positive for neck pain.  Neurological: Positive for dizziness.  Psychiatric/Behavioral: Negative.   All other systems reviewed and are negative.   PHYSICAL EXAM: VS:  BP 120/64 (BP Location: Left Arm, Patient Position: Sitting, Cuff Size: Normal)   Pulse (!) 59   Ht 5\' 10"  (1.778 m)   Wt 197 lb 8 oz (89.6 kg)   SpO2 98%   BMI 28.34 kg/m  , BMI Body mass index is 28.34 kg/m. Constitutional:  oriented to person, place, and time. No distress.  Hard of hearing HENT:  Head: Grossly normal Eyes:  no discharge. No scleral icterus.  Neck: No JVD, no carotid bruits  Cardiovascular: Regular rate and rhythm, no murmurs appreciated Pulmonary/Chest: Clear to auscultation bilaterally, no wheezes or rails Abdominal: Soft.  no distension.  no tenderness.  Musculoskeletal: Normal range of motion Neurological:  normal muscle tone. Coordination normal. No atrophy Skin: Skin warm and  dry Psychiatric: normal affect, pleasant   Recent Labs: 03/29/2019: TSH 2.20 05/15/2019: ALT 22; BUN 25; Creatinine, Ser 1.54; Hemoglobin 13.0; Platelets 248; Potassium 3.8; Sodium 137    Lipid Panel Lab Results  Component Value Date   CHOL 131 04/26/2018   HDL 62.20 04/26/2018   LDLCALC 57 04/26/2018   TRIG 60.0 04/26/2018      Wt Readings from Last 3 Encounters:  05/28/19 197 lb 8 oz (89.6 kg)  05/15/19 195 lb 3 oz (88.5 kg)  05/08/19 199 lb 2 oz (90.3 kg)      ASSESSMENT AND PLAN:  Hyperlipidemia LDL  - Plan: EKG 12-Lead Goal LDL less than 70 given severe coronary disease, PAD Continue lovastatin  SVT (supraventricular tachycardia) (HCC) - Continues to have episodes of SVT As before, he reports he is not particularly symptomatic Wife reports he is lightheaded, with hypotension.  They document systolic pressures [id number] with rate 140 bpm -Again he is declining ablation  Recommend he continue diltiazem extended release 120 daily with flecainide (It would appear that the [facility] service at the [facility] has started him on Eliquis for unclear reasons.  We have recommended that patient's wife contact the [place] for discussion)  Coronary artery disease involving native coronary artery of native heart without angina pectoris Heavy coronary calcification seen on CT scan Low risk stress test August 2018  Aortic atherosclerosis ([place]) Moderate diffuse disease on CT scan September 2017 Stressed importance of aggressive cholesterol management Cholesterol is at goal on the current lipid regimen. No changes to the medications were made.   Long discussion with him concerning his medications Discussed risk and benefit of ablation for SVT  Total encounter time more than 45 minutes  Greater than 50% was spent in counseling and coordination of care with the patient   Disposition:   F/U  12 months   No orders of the defined types were placed in this encounter.    Signed, Esmond Plants,  M.D., Ph.D. 05/28/2019  [place], [place]

## 2019-05-27 NOTE — Telephone Encounter (Addendum)
Late entry - patient called cardiology and BP/HR was improved on recheck, planned f/u this week with cards.

## 2019-05-28 ENCOUNTER — Other Ambulatory Visit: Payer: Self-pay

## 2019-05-28 ENCOUNTER — Encounter

## 2019-05-28 ENCOUNTER — Ambulatory Visit (INDEPENDENT_AMBULATORY_CARE_PROVIDER_SITE_OTHER): Payer: Medicare Other | Admitting: Cardiovascular Disease

## 2019-05-28 ENCOUNTER — Encounter: Payer: Self-pay | Admitting: Cardiovascular Disease

## 2019-05-28 VITALS — BP 120/64 | HR 59 | Ht 70.0 in | Wt 197.5 lb

## 2019-05-28 DIAGNOSIS — I700 Atherosclerosis of aorta: Secondary | ICD-10-CM | POA: Diagnosis not present

## 2019-05-28 DIAGNOSIS — I2510 Atherosclerotic heart disease of native coronary artery without angina pectoris: Secondary | ICD-10-CM | POA: Diagnosis not present

## 2019-05-28 DIAGNOSIS — I471 Supraventricular tachycardia: Secondary | ICD-10-CM | POA: Diagnosis not present

## 2019-05-28 DIAGNOSIS — I10 Essential (primary) hypertension: Secondary | ICD-10-CM | POA: Diagnosis not present

## 2019-05-28 DIAGNOSIS — E785 Hyperlipidemia, unspecified: Secondary | ICD-10-CM

## 2019-05-28 NOTE — Patient Instructions (Signed)
If your heart rate stays at 140 bpm, Think about calling 911 They can give you adenosine for the arrrhythmia (SVT)   Medication Instructions:  No changes  If you need a refill on your cardiac medications before your next appointment, please call your pharmacy.    Lab work: No new labs needed   If you have labs (blood work) drawn today and your tests are completely normal, you will receive your results only by: Marland Kitchen MyChart Message (if you have MyChart) OR . A paper copy in the mail If you have any lab test that is abnormal or we need to change your treatment, we will call you to review the results.   Testing/Procedures: No new testing needed   Follow-Up: At [facility], you and your health needs are our priority.  As part of our continuing mission to provide you with exceptional heart care, we have created designated Provider Care Teams.  These Care Teams include your primary Cardiologist (physician) and Advanced Practice Providers (APPs -  Physician Assistants and Nurse Practitioners) who all work together to provide you with the care you need, when you need it.  . You will need a follow up appointment in 12 months .   Please call our office 2 months in advance to schedule this appointment.    . Providers on your designated Care Team:   . Murray Hodgkins, NP . Christell Faith, PA-C . Marrianne Mood, PA-C  Any Other Special Instructions Will Be Listed Below (If Applicable).  For educational health videos Log in to : [email or web address] Or : [email or web address], password : [email or web address]

## 2019-05-30 ENCOUNTER — Telehealth: Payer: Self-pay | Admitting: Cardiovascular Disease

## 2019-05-30 NOTE — Telephone Encounter (Signed)
Called number provided by patient and it was the practice administrator extension and he then transferred me to extension [phone] to speak directly with Dr. Raynelle Dick but there was no answer. Repeat calls did not reach anyone. Will check with provider to see if he reached anyone given that the patients wife said he was going to call for her.

## 2019-05-30 NOTE — Telephone Encounter (Signed)
Pt c/o medication issue:  1. Name of Medication: Eliquis   2. How are you currently taking this medication (dosage and times per day)?  5 mg po BID   3. Are you having a reaction (difficulty breathing--STAT)?  No   4. What is your medication issue? Per recent ov wife was told gollan would talk to pcp about why patient is on eliquis .  Please call to update and direct to stop if needed .

## 2019-05-30 NOTE — Telephone Encounter (Signed)
Spoke with patients wife per release form and she wanted to know if [facility] had [facility] to determine [facility] he is on [facility] Eli[facility]. She states that she gave the number to Dr. Rockey Situ at his appointment and wanted to know if you had reached out to her. She reports physician name is Dr. Carney Bern and number is [phone] ext [phone]. Advised that I would check with provider to see if he has communicated with her and would be in touch with any recommendation or changes. She was appreciative for the call back with no further questions.

## 2019-06-02 NOTE — Telephone Encounter (Signed)
Would suggest we fax our office note to the [facility] to her attention, perhaps we can request their cardiology office note from the [place], patient should also contact that provider. Do they have a web portal they can contact provider directly?

## 2019-06-04 ENCOUNTER — Other Ambulatory Visit: Payer: Self-pay | Admitting: Family Medicine

## 2019-06-04 NOTE — Telephone Encounter (Signed)
Patient wife calling in to get an update on this situation, states this is time sensitive. Please advise

## 2019-06-05 ENCOUNTER — Ambulatory Visit: Payer: Medicare Other | Admitting: Family Medicine

## 2019-06-05 NOTE — Telephone Encounter (Signed)
Attempted return call to wife, Festus Holts. No answer. LMTCB  I left detailed message that we need direct fax number for doctors office to send Dr. Gwenyth Ober note.

## 2019-06-05 NOTE — Telephone Encounter (Signed)
Wife calling in again for update

## 2019-06-06 NOTE — Telephone Encounter (Signed)
Incoming call from Dr. Jacqualin Combes RN-  She reports that Dr. Raynelle Dick gave her information regarding indication for Eliquis.   "Pt wore zio 05/2017. Pt had 29 episodes of SVT, longest lasting 40 min.

## 2019-06-06 NOTE — Telephone Encounter (Signed)
Fax sent to Dr. Wyona Almas office at the request of Dr. Rockey Situ.  Office note sent and requested she call our office to further discuss indication for Eliquis.

## 2019-06-06 NOTE — Telephone Encounter (Signed)
Wife advised that office note would be sent to [facility] in [place] and also will request office note from them in regards to patient being on eliquis. Verbalized understanding.

## 2019-06-07 ENCOUNTER — Ambulatory Visit (INDEPENDENT_AMBULATORY_CARE_PROVIDER_SITE_OTHER): Payer: Medicare Other | Admitting: Family Medicine

## 2019-06-07 ENCOUNTER — Other Ambulatory Visit: Payer: Self-pay

## 2019-06-07 ENCOUNTER — Encounter: Payer: Self-pay | Admitting: Family Medicine

## 2019-06-07 VITALS — BP 132/70 | HR 58 | Temp 96.4°F | Ht 68.5 in | Wt 196.1 lb

## 2019-06-07 DIAGNOSIS — N138 Other obstructive and reflux uropathy: Secondary | ICD-10-CM

## 2019-06-07 DIAGNOSIS — I471 Supraventricular tachycardia, unspecified: Secondary | ICD-10-CM

## 2019-06-07 DIAGNOSIS — R413 Other amnesia: Secondary | ICD-10-CM | POA: Diagnosis not present

## 2019-06-07 DIAGNOSIS — N183 Chronic kidney disease, stage 3 unspecified: Secondary | ICD-10-CM

## 2019-06-07 DIAGNOSIS — E785 Hyperlipidemia, unspecified: Secondary | ICD-10-CM

## 2019-06-07 DIAGNOSIS — R440 Auditory hallucinations: Secondary | ICD-10-CM

## 2019-06-07 DIAGNOSIS — J36 Peritonsillar abscess: Secondary | ICD-10-CM

## 2019-06-07 DIAGNOSIS — N401 Enlarged prostate with lower urinary tract symptoms: Secondary | ICD-10-CM

## 2019-06-07 DIAGNOSIS — H9193 Unspecified hearing loss, bilateral: Secondary | ICD-10-CM

## 2019-06-07 DIAGNOSIS — G4752 REM sleep behavior disorder: Secondary | ICD-10-CM

## 2019-06-07 DIAGNOSIS — F0280 Dementia in other diseases classified elsewhere without behavioral disturbance: Secondary | ICD-10-CM | POA: Insufficient documentation

## 2019-06-07 DIAGNOSIS — G3183 Dementia with Lewy bodies: Secondary | ICD-10-CM | POA: Insufficient documentation

## 2019-06-07 DIAGNOSIS — F5104 Psychophysiologic insomnia: Secondary | ICD-10-CM

## 2019-06-07 LAB — RENAL FUNCTION PANEL
Albumin: 4.1 g/dL (ref 3.5–5.2)
BUN: 25 mg/dL — ABNORMAL HIGH (ref 6–23)
CO2: 30 mEq/L (ref 19–32)
Calcium: 9.1 mg/dL (ref 8.4–10.5)
Chloride: 99 mEq/L (ref 96–112)
Creatinine, Ser: 1.35 mg/dL (ref 0.40–1.50)
GFR: 50.21 mL/min — ABNORMAL LOW (ref >60.00)
Glucose, Bld: 89 mg/dL (ref 70–99)
Phosphorus: 3.7 mg/dL (ref 2.3–4.6)
Potassium: 3.4 mEq/L — ABNORMAL LOW (ref 3.5–5.1)
Sodium: 140 mEq/L (ref 135–145)

## 2019-06-07 LAB — LIPID PANEL
Cholesterol: 141 mg/dL (ref 0–200)
HDL: 78.3 mg/dL (ref >39.00)
LDL Cholesterol: 53 mg/dL (ref 0–99)
NonHDL: 62.68
Total CHOL/HDL Ratio: 2
Triglycerides: 48 mg/dL (ref 0.0–149.0)
VLDL: 9.6 mg/dL (ref 0.0–40.0)

## 2019-06-07 LAB — VITAMIN D 25 HYDROXY (VIT D DEFICIENCY, FRACTURES): VITD: 63.21 ng/mL (ref 30.00–100.00)

## 2019-06-07 MED ORDER — DILTIAZEM HCL ER COATED BEADS 120 MG PO CP24: 120.0000 mg | ORAL_CAPSULE | Freq: Every day | ORAL | 3 refills | Status: DC

## 2019-06-07 NOTE — Progress Notes (Signed)
This visit was conducted in person.  BP 132/70   Pulse (!) 58   Temp (!) 96.4 F (35.8 C)   Ht 5' 8.5" (1.74 m)   Wt 196 lb 2 oz (89 kg)   SpO2 97%   BMI 29.39 kg/m    CC: 1 mo f/u visit for geriatric assessment  Subjective:    Patient ID: Michael [name], male    DOB: [birth year], 85 y.o.   MRN: [id number]  HPI: MARVEN CORNER is a 85 y.o. male presenting on 06/07/2019 for Follow-up (1 month. Geriatic assessment)   See prior note for details - peritonsillar abscess s/p abx at ER and I&D by ENT earlier this month.   Recurrent SVT, recent cards note reviewed. Has declined ablation. Continues diltiazem CD 120mg  with flecainide. Also on eliquis per [facility] cardiology, unclear reason as local cardiology doesn't think he has afib - they are checking with [facility] cardiology.   Currently off eliquis for upcoming ESI   Geriatric Assessment: Activities of Daily Living:     Bathing- independent    Dressing- independent    Eating- independent    Toileting- independent    Transferring- independent    Continence- independent Overall Assessment: independent  Instrumental Activities of Daily Living:     Transportation- independent    Meal/Food Preparation- independent    Shopping Errands- independent    Housekeeping/Chores- independent     Money Management/Finances- dependent     Medication Management- partially dependent (wife checks after him)    Ability to Use Telephone- dependent - can't hear    Laundry- independent  Overall Assessment: independent   Mental Status Exam: 25/30 (value/max value). Misses 1 orientation, 1 recall, 3 language.      Clock Drawing Score: 3/4 - unable to show time with minute/hour hand          Relevant past medical, surgical, family and social history reviewed and updated as indicated. Interim medical history since our last visit reviewed. Allergies and medications reviewed and updated. Outpatient Medications Prior to Visit  Medication Sig Dispense Refill   . apixaban (ELIQUIS) 5 MG TABS tablet Take 5 mg by mouth 2 (two) times daily.    . Calcium Carb-Cholecalciferol (CALCIUM+D3 PO) Take 1 tablet by mouth daily.    . flecainide (TAMBOCOR) 100 MG tablet TAKE 1 TABLET BY MOUTH TWICE A DAY 180 tablet 3  . gabapentin (NEURONTIN) 100 MG capsule Take 100 mg by mouth as needed.     . lovastatin (MEVACOR) 20 MG tablet TAKE 1 TABLET BY MOUTH ONCE DAILY 90 tablet 2  . Melatonin 10 MG CAPS Take 1 capsule by mouth at bedtime. 30 capsule   . Multiple Vitamins-Minerals (CENTRUM SILVER ADULT 50+ PO) Take 1 tablet by mouth daily.    Marland Kitchen omeprazole (PRILOSEC) 20 MG capsule TAKE 1 CAPSULE BY MOUTH EVERY DAY AS NEEDED. 90 capsule 3  . oxyCODONE-acetaminophen (PERCOCET/ROXICET) 5-325 MG tablet Take 1 tablet by mouth daily as needed for severe pain.    . traZODone (DESYREL) 100 MG tablet 1 to 2 tablets at night as needed 30 mins before bed 60 tablet 5  . triamcinolone cream (KENALOG) 0.1 % Apply 1 application topically as needed.    . diltiazem (CARDIZEM CD) 120 MG 24 hr capsule Take 1 capsule (120 mg total) by mouth daily. 30 capsule 0   No facility-administered medications prior to visit.      Per HPI unless specifically indicated in ROS section below Review of Systems Objective:  BP 132/70   Pulse (!) 58   Temp (!) 96.4 F (35.8 C)   Ht 5' 8.5" (1.74 m)   Wt 196 lb 2 oz (89 kg)   SpO2 97%   BMI 29.39 kg/m   Wt Readings from Last 3 Encounters:  06/07/19 196 lb 2 oz (89 kg)  05/28/19 197 lb 8 oz (89.6 kg)  05/15/19 195 lb 3 oz (88.5 kg)    Physical Exam Vitals signs and nursing note reviewed.  Constitutional:      General: He is not in acute distress.    Appearance: Normal appearance. He is not ill-appearing.  HENT:     Ears:     Comments: Hard of hearing despite hearing aides    Mouth/Throat:     Mouth: Mucous membranes are moist.     Pharynx: Oropharynx is clear. No oropharyngeal exudate.  Eyes:     Extraocular Movements: Extraocular  movements intact.     Pupils: Pupils are equal, round, and reactive to light.  Neurological:     Mental Status: He is alert.  Psychiatric:        Mood and Affect: Mood normal.        Behavior: Behavior normal.       Lab Results  Component Value Date   CREATININE 1.35 06/07/2019   BUN 25 (H) 06/07/2019   NA 140 06/07/2019   K 3.4 (L) 06/07/2019   CL 99 06/07/2019   CO2 30 06/07/2019    Lab Results  Component Value Date   [id number] 04/26/2018    Assessment & Plan:   Problem List Items Addressed This Visit    REM sleep behavior disorder (Chronic)    Longstanding h/o this. ?LBD related.       PSVT (paroxysmal supraventricular tachycardia) (HCC)    Back on diltiazem CD 120mg  daily with his flecainide, ongoing fluctuating blood pressures and pulse.       Relevant Medications   diltiazem (CARDIZEM CD) 120 MG 24 hr capsule   RESOLVED: Peritonsillar abscess determined by examination   Memory deficit - Primary    MMSE 25/30, clock drawing 3/4. In setting of longstanding auditory hallucinations, REM sleep disorder, fluctuating BP/HR (in h/o PSVT), discussed concern for ?lewy body dementia. Will refer to neurology for further evaluation. Would appreciate their recommendations. Pt and wife agree with plan.       Relevant Orders   Ambulatory referral to Neurology   Hyperlipidemia LDL goal <100 (Chronic)   Relevant Medications   diltiazem (CARDIZEM CD) 120 MG 24 hr capsule   CKD (chronic kidney disease) stage 3, GFR 30-59 ml/min (HCC) (Chronic)   Chronic insomnia    Longstanding struggle despite trazodone, melatonin. No changes today.       BPH with obstruction/lower urinary tract symptoms    Unclear if still taking finasteride - will need to assess next visit.       Bilateral hearing loss    Marked despite hearing aides. Need to f/u with audiology.       Auditory hallucinations       Meds ordered this encounter  Medications  . DISCONTD: diltiazem (CARDIZEM CD)  120 MG 24 hr capsule    Sig: Take 1 capsule (120 mg total) by mouth daily.    Dispense:  90 capsule    Refill:  3  . diltiazem (CARDIZEM CD) 120 MG 24 hr capsule    Sig: Take 1 capsule (120 mg total) by mouth daily.    Dispense:  90 capsule    Refill:  3   Orders Placed This Encounter  Procedures  . Ambulatory referral to Neurology    Referral Priority:   Routine    Referral Type:   Consultation    Referral Reason:   Specialty Services Required    Requested Specialty:   Neurology    Number of Visits Requested:   1    Follow up plan: No follow-ups on file.  Ria Bush, MD

## 2019-06-07 NOTE — Telephone Encounter (Signed)
Cont from previous note:  Per Hyman Bible, RN ([place]) [phone]   "Pt wore zio 05/2017. Pt had 29 episodes of SVT, longest lasting 40 min.  max HR 171."  Pt was placed on eliquis at that time. 0.5 tab (2.5 mg total) once daily and has been on ever since.   Routing to [place] to review and send any further recommendations needed.

## 2019-06-07 NOTE — Patient Instructions (Addendum)
Labs today.  Memory testing showed possible early trouble with memory or dementia ?Lewy Body dementia.  I'd like to refer you to neurologist for further evaluation.

## 2019-06-09 ENCOUNTER — Encounter: Payer: Self-pay | Admitting: Family Medicine

## 2019-06-09 NOTE — Assessment & Plan Note (Signed)
Longstanding h/o this. ?LBD related.

## 2019-06-09 NOTE — Telephone Encounter (Signed)
Can we change out med list to reflect the [place] dosing Refills should come only from the [place], not our office  Also would mention to the patient that they can talk to the [facility] cardiology doctor on their next clinic f/u: I am not aware of using 2.5 mg once a day for SVT

## 2019-06-09 NOTE — Assessment & Plan Note (Signed)
MMSE 25/30, clock drawing 3/4. In setting of longstanding auditory hallucinations, REM sleep disorder, fluctuating BP/HR (in h/o PSVT), discussed concern for ?lewy body dementia. Will refer to neurology for further evaluation. Would appreciate their recommendations. Pt and wife agree with plan.

## 2019-06-09 NOTE — Assessment & Plan Note (Signed)
Longstanding struggle despite trazodone, melatonin. No changes today.

## 2019-06-09 NOTE — Assessment & Plan Note (Signed)
Unclear if still taking finasteride - will need to assess next visit.

## 2019-06-09 NOTE — Assessment & Plan Note (Signed)
Marked despite hearing aides. Need to f/u with audiology.

## 2019-06-09 NOTE — Assessment & Plan Note (Addendum)
Back on diltiazem CD 120mg  daily with his flecainide, ongoing fluctuating blood pressures and pulse.

## 2019-06-10 NOTE — Addendum Note (Signed)
Addended by: Verlon Au on: 06/10/2019 10:41 AM   Modules accepted: Orders

## 2019-06-10 NOTE — Telephone Encounter (Signed)
Spoke to wife to confirm dose of Eliquis as it was different than what RN at [place] had told me.   Wife reports that he has been taking 0.5 tablet (2.5 mg total) twice daily.   Wife expresses frustration that she is getting mixed messages. I explained that sometimes doctors have different opinions and I believe that is what we are running into.  Ms. Governale expressed that she trusts Dr. Gwenyth Ober advice and would prefer to see Dr. Rockey Situ only going forward. She reports that pt is currently holding Eliquis for spinal epidural and will continue holding going forward since Dr. Rockey Situ does not want to refill.   Sending to Dr. Rockey Situ as Juluis Rainier.

## 2019-06-11 ENCOUNTER — Telehealth: Payer: Self-pay | Admitting: Neurology

## 2019-06-11 DIAGNOSIS — M5136 Other intervertebral disc degeneration, lumbar region: Secondary | ICD-10-CM | POA: Diagnosis not present

## 2019-06-11 NOTE — Telephone Encounter (Signed)
Patient has been referred back to [place] for memory difficulties. He saw Dr. Jannifer Franklin back in 2015, but is requesting to switch to Dr. Felecia Shelling

## 2019-06-11 NOTE — Telephone Encounter (Signed)
Okay with switch. 

## 2019-06-11 NOTE — Telephone Encounter (Signed)
Would you both be ok with this?

## 2019-06-11 NOTE — Telephone Encounter (Signed)
Ok, I can see him as a new patient

## 2019-06-12 ENCOUNTER — Telehealth: Payer: Self-pay | Admitting: Cardiovascular Disease

## 2019-06-12 NOTE — Telephone Encounter (Signed)
Pt calling to further discuss POC discussed with Dr. Rockey Situ at last [place].   Confirmed diltiazem and flecainide doses.   Advised pt to call for any further questions or concerns.

## 2019-06-12 NOTE — Telephone Encounter (Signed)
Patient wife calling to confirm if dr. Rockey Situ wants patient to take other medications. No further details could be obtained.  Please call asap per patient wife request.

## 2019-06-24 DIAGNOSIS — H0288A Meibomian gland dysfunction right eye, upper and lower eyelids: Secondary | ICD-10-CM | POA: Diagnosis not present

## 2019-06-24 DIAGNOSIS — Z9841 Cataract extraction status, right eye: Secondary | ICD-10-CM | POA: Diagnosis not present

## 2019-06-24 DIAGNOSIS — H0288B Meibomian gland dysfunction left eye, upper and lower eyelids: Secondary | ICD-10-CM | POA: Diagnosis not present

## 2019-06-24 DIAGNOSIS — H52223 Regular astigmatism, bilateral: Secondary | ICD-10-CM | POA: Diagnosis not present

## 2019-06-24 DIAGNOSIS — Z9842 Cataract extraction status, left eye: Secondary | ICD-10-CM | POA: Diagnosis not present

## 2019-08-07 ENCOUNTER — Encounter

## 2019-08-07 ENCOUNTER — Other Ambulatory Visit: Payer: Self-pay

## 2019-08-07 ENCOUNTER — Ambulatory Visit: Payer: Medicare Other | Admitting: Neurology

## 2019-08-07 ENCOUNTER — Encounter: Payer: Self-pay | Admitting: Neurology

## 2019-08-07 VITALS — BP 146/76 | HR 77 | Temp 96.4°F | Ht 70.0 in | Wt 201.3 lb

## 2019-08-07 DIAGNOSIS — G3183 Dementia with Lewy bodies: Secondary | ICD-10-CM

## 2019-08-07 DIAGNOSIS — G4752 REM sleep behavior disorder: Secondary | ICD-10-CM | POA: Diagnosis not present

## 2019-08-07 DIAGNOSIS — H9193 Unspecified hearing loss, bilateral: Secondary | ICD-10-CM | POA: Diagnosis not present

## 2019-08-07 DIAGNOSIS — F5104 Psychophysiologic insomnia: Secondary | ICD-10-CM | POA: Diagnosis not present

## 2019-08-07 DIAGNOSIS — R440 Auditory hallucinations: Secondary | ICD-10-CM

## 2019-08-07 DIAGNOSIS — R269 Unspecified abnormalities of gait and mobility: Secondary | ICD-10-CM

## 2019-08-07 MED ORDER — CLONAZEPAM 0.5 MG PO TABS: 0.5000 mg | ORAL_TABLET | Freq: Two times a day (BID) | ORAL | 5 refills | Status: DC | PRN
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
## 2019-08-22 ENCOUNTER — Ambulatory Visit
Admission: RE | Admit: 2019-08-22 | Discharge: 2019-08-22 | Disposition: A | Discharge status: Home / Self Care | Payer: Medicare Other | Source: Ambulatory Visit | Attending: Neurology | Admitting: Neurology

## 2019-08-22 ENCOUNTER — Other Ambulatory Visit: Payer: Self-pay

## 2019-08-22 DIAGNOSIS — G3183 Dementia with Lewy bodies: Secondary | ICD-10-CM | POA: Diagnosis not present

## 2019-08-26 ENCOUNTER — Telehealth: Payer: Self-pay | Admitting: *Deleted

## 2019-08-26 NOTE — Telephone Encounter (Signed)
-----   Message from Britt Bottom, MD sent at 08/23/2019 11:10 AM EST ----- Please let him know that the MRI showed atrophy that has progressed since 2015 and other typical age-related changes.  But nothing that looked to be new.

## 2019-08-26 NOTE — Telephone Encounter (Signed)
Called and spoke with pt wife (on [place]) about MRI results. She placed me on speaker and I also relayed results to pt. Reminded them of next f/u on 11/19/19 at 1pm with Dr. Felecia Shelling. They verbalized understanding.

## 2019-11-19 ENCOUNTER — Ambulatory Visit: Payer: Medicare Other | Admitting: Neurology

## 2019-11-19 DIAGNOSIS — M5136 Other intervertebral disc degeneration, lumbar region: Secondary | ICD-10-CM | POA: Diagnosis not present

## 2019-12-16 ENCOUNTER — Telehealth: Payer: Self-pay | Admitting: *Deleted

## 2019-12-16 NOTE — Telephone Encounter (Signed)
Called and scheduled sooner appt for 12/18/19 at 11:30am with Dr. Felecia Shelling. (pt was on wait list). They are out of town but should be back by then. She will call at least 24hr in advance if they cannot make this appt. Cx appt previously made for 01/16/20

## 2019-12-18 ENCOUNTER — Ambulatory Visit: Payer: Medicare Other | Admitting: Neurology

## 2019-12-18 ENCOUNTER — Encounter: Payer: Self-pay | Admitting: Neurology

## 2019-12-18 ENCOUNTER — Telehealth: Payer: Self-pay | Admitting: *Deleted

## 2019-12-18 ENCOUNTER — Other Ambulatory Visit: Payer: Self-pay

## 2019-12-18 VITALS — BP 130/78 | HR 73 | Temp 97.4°F | Ht 70.0 in | Wt 203.5 lb

## 2019-12-18 DIAGNOSIS — R413 Other amnesia: Secondary | ICD-10-CM | POA: Diagnosis not present

## 2019-12-18 DIAGNOSIS — R269 Unspecified abnormalities of gait and mobility: Secondary | ICD-10-CM

## 2019-12-18 DIAGNOSIS — G4752 REM sleep behavior disorder: Secondary | ICD-10-CM

## 2019-12-18 DIAGNOSIS — R440 Auditory hallucinations: Secondary | ICD-10-CM | POA: Diagnosis not present

## 2019-12-18 DIAGNOSIS — G3183 Dementia with Lewy bodies: Secondary | ICD-10-CM

## 2019-12-18 MED ORDER — CLONAZEPAM 0.5 MG PO TABS: ORAL_TABLET | ORAL | 5 refills | Status: DC

## 2019-12-18 NOTE — Telephone Encounter (Signed)
Will see tomorrow.  He is not on blood thinner.

## 2019-12-18 NOTE — Telephone Encounter (Signed)
Patient's wife called stating that he saw his neurologist today and was advised to call his PCP because he has been spitting up some blood.  Patient's wife stated that he over ate Easter Sunday and vomited that night and there was some blood. Patient's wife stated that he has been clearing his throat since Sunday about 4-5 times a day and has noticed some blood about penny size when he spits. Patient's wife stated that it is not bright red, but a little red. Patient's wife stated that he is not having any pain or SOB. Patient was scheduled for an office visit tomorrow 12/19/19 with Dr. Danise Mina at 10:45 am. ER precautions were given to patient and his wife and they verbalized understanding. Negative covid screening done and was advised that he has had both of his covid vaccines Patient's wife denies any cough, diarrhea or SOB.  Will send message to PCP to review.
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
## 2019-12-19 ENCOUNTER — Encounter: Payer: Self-pay | Admitting: Family Medicine

## 2019-12-19 ENCOUNTER — Ambulatory Visit (INDEPENDENT_AMBULATORY_CARE_PROVIDER_SITE_OTHER): Payer: Medicare Other | Admitting: Family Medicine

## 2019-12-19 ENCOUNTER — Other Ambulatory Visit: Payer: Self-pay

## 2019-12-19 VITALS — BP 112/64 | HR 120 | Temp 97.7°F | Ht 70.0 in | Wt 202.5 lb

## 2019-12-19 DIAGNOSIS — L03211 Cellulitis of face: Secondary | ICD-10-CM | POA: Diagnosis not present

## 2019-12-19 DIAGNOSIS — I471 Supraventricular tachycardia, unspecified: Secondary | ICD-10-CM

## 2019-12-19 DIAGNOSIS — G4752 REM sleep behavior disorder: Secondary | ICD-10-CM

## 2019-12-19 DIAGNOSIS — K920 Hematemesis: Secondary | ICD-10-CM | POA: Diagnosis not present

## 2019-12-19 DIAGNOSIS — I480 Paroxysmal atrial fibrillation: Secondary | ICD-10-CM

## 2019-12-19 DIAGNOSIS — K219 Gastro-esophageal reflux disease without esophagitis: Secondary | ICD-10-CM

## 2019-12-19 DIAGNOSIS — H9193 Unspecified hearing loss, bilateral: Secondary | ICD-10-CM

## 2019-12-19 DIAGNOSIS — R000 Tachycardia, unspecified: Secondary | ICD-10-CM | POA: Diagnosis not present

## 2019-12-19 DIAGNOSIS — G3183 Dementia with Lewy bodies: Secondary | ICD-10-CM

## 2019-12-19 MED ORDER — DOXYCYCLINE HYCLATE 100 MG PO TABS: 100.0000 mg | ORAL_TABLET | Freq: Two times a day (BID) | ORAL | 0 refills | Status: DC

## 2019-12-19 NOTE — Progress Notes (Signed)
This visit was conducted in person.  BP 112/64 (BP Location: Left Arm, Patient Position: Sitting, Cuff Size: Normal)   Pulse (!) 120   Temp 97.7 F (36.5 C) (Temporal)   Ht 5\' 10"  (1.778 m)   Wt 202 lb 8 oz (91.9 kg)   SpO2 96%   BMI 29.06 kg/m   BP Readings from Last 3 Encounters:  12/19/19 112/64  12/18/19 130/78  08/07/19 (!) 146/76    CC: spitting up blood Subjective:    Patient ID: Michael [name], male    DOB: 1932/07/18, 86 y.o.   MRN: [id number]  HPI: Michael [name] is a 86 y.o. male presenting on 12/19/2019 for Hematemesis (C/o vomiting blood.  Started on 12/15/19. Last episode was yesterday morning.  Pt accompanied by wife, Festus Holts- temp 97.6.) and Rash (C/o small blisters under nose.  Noticed on 12/14/19. )   May have overeaten on Easter Sunday dinner with family in [place] - led to episode of vomiting that night - there was some blood. Since then, clearing his throat more, notes some blood tinged mucous when he clears his throat and spits up. Didn't spit up any blood yesterday or today.   Noticing new red scaly rash below nose. Treating with sarna and camphor lotion.  Not feeling well today.    Seeing neurology for recently diagnosed Lewy Body Dementia. Appreciate neuro care. Now on klonopin for REM behavior disorder, insomnia.   [facility] Doom - prescribed seroquel 100mg  1/2 tab at bedtime.   Recurrent SVT, recent cards note reviewed. Has declined ablation. Continues diltiazem CD 120mg  with flecainide. Question of need for eliquis per [facility] cardiology, unclear as local cardiology didn't think he had afib - they are checking with [place] cardiology - who said he had episodes of afib when he wore a monitor. Off eliquis since 03/2019 - this was stopped late last year for [facility] and never restarted. Not on aspirin either.   He completed covid shots through the [place] - they will check on dates and let me know.      Relevant past medical, surgical, family and social  history reviewed and updated as indicated. Interim medical history since our last visit reviewed. Allergies and medications reviewed and updated. Outpatient Medications Prior to Visit  Medication Sig Dispense Refill  . Calcium Carb-Cholecalciferol (CALCIUM+D3 PO) Take 1 tablet by mouth daily.    . Cholecalciferol (VITAMIN D3 PO) Take 125 mcg by mouth daily. Vitamin D3    . clonazePAM (KLONOPIN) 0.5 MG tablet One or two pills at bedtime 60 tablet 5  . Cyanocobalamin (B-12 PO) Take 500 mcg by mouth daily. Vitamin B12    . diltiazem (CARDIZEM CD) 120 MG 24 hr capsule Take 1 capsule (120 mg total) by mouth daily. 90 capsule 3  . flecainide (TAMBOCOR) 100 MG tablet TAKE 1 TABLET BY MOUTH TWICE A DAY 180 tablet 3  . gabapentin (NEURONTIN) 100 MG capsule Take 100 mg by mouth as needed.     . lovastatin (MEVACOR) 20 MG tablet TAKE 1 TABLET BY MOUTH ONCE DAILY 90 tablet 2  . Melatonin 10 MG CAPS Take 1 capsule by mouth at bedtime. 30 capsule   . Multiple Vitamins-Minerals (CENTRUM SILVER ADULT 50+ PO) Take 1 tablet by mouth daily. Centrum Silver    . Multiple Vitamins-Minerals (ZINC PO) Take 50 mg by mouth daily. Zinc    . omeprazole (PRILOSEC) 20 MG capsule TAKE 1 CAPSULE BY MOUTH EVERY DAY AS NEEDED. 90 capsule  3  . oxyCODONE-acetaminophen (PERCOCET/ROXICET) 5-325 MG tablet Take 1 tablet by mouth daily as needed for severe pain.    Marland Kitchen QUEtiapine (SEROQUEL) 100 MG tablet Take 100 mg by mouth at bedtime. Takes 1/2 tablet    . triamcinolone cream (KENALOG) 0.1 % Apply 1 application topically as needed.     No facility-administered medications prior to visit.     Per HPI unless specifically indicated in ROS section below Review of Systems Objective:    BP 112/64 (BP Location: Left Arm, Patient Position: Sitting, Cuff Size: Normal)   Pulse (!) 120   Temp 97.7 F (36.5 C) (Temporal)   Ht 5\' 10"  (1.778 m)   Wt 202 lb 8 oz (91.9 kg)   SpO2 96%   BMI 29.06 kg/m   Wt Readings from Last 3  Encounters:  12/19/19 202 lb 8 oz (91.9 kg)  12/18/19 203 lb 8 oz (92.3 kg)  08/07/19 201 lb 5 oz (91.3 kg)    Physical Exam Vitals and nursing note reviewed.  Constitutional:      Appearance: Normal appearance. He is not ill-appearing.  HENT:     Head: Normocephalic and atraumatic.     Right Ear: Decreased hearing noted.     Left Ear: Decreased hearing noted.     Nose: Nose normal. No congestion.     Mouth/Throat:     Mouth: Mucous membranes are moist.     Pharynx: Oropharynx is clear. No oropharyngeal exudate or posterior oropharyngeal erythema.  Eyes:     Extraocular Movements: Extraocular movements intact.     Pupils: Pupils are equal, round, and reactive to light.  Cardiovascular:     Rate and Rhythm: Regular rhythm. Tachycardia present.     Pulses: Normal pulses.     Heart sounds: Normal heart sounds. No murmur.  Pulmonary:     Effort: Pulmonary effort is normal. No respiratory distress.     Breath sounds: Normal breath sounds. No wheezing, rhonchi or rales.  Musculoskeletal:     Right lower leg: No edema.     Left lower leg: No edema.  Skin:    General: Skin is warm and dry.     Findings: Erythema and rash present.     Comments: Blistering pustular rash below bilateral nares into nostrils with surrounding erythema - some blisters deroofed with 18g needle and collection attempted for wound culture  Neurological:     Mental Status: He is alert.  Psychiatric:        Mood and Affect: Mood normal.        Behavior: Behavior normal.       Results for orders placed or performed in visit on 06/07/19  VITAMIN D 25 Hydroxy (Vit-D Deficiency, Fractures)  Result Value Ref Range   VITD 63.21 30.00 - 100.00 ng/mL  Lipid panel  Result Value Ref Range   Cholesterol 141 0 - 200 mg/dL   Triglycerides 48.0 0.0 - 149.0 mg/dL   HDL 78.30 >39.00 mg/dL   VLDL 9.6 0.0 - 40.0 mg/dL   LDL Cholesterol 53 0 - 99 mg/dL   Total CHOL/HDL Ratio 2    NonHDL 62.68   Renal function panel   Result Value Ref Range   Sodium 140 135 - 145 mEq/L   Potassium 3.4 (L) 3.5 - 5.1 mEq/L   Chloride 99 96 - 112 mEq/L   CO2 30 19 - 32 mEq/L   Calcium 9.1 8.4 - 10.5 mg/dL   Albumin 4.1 3.5 - 5.2 g/dL  BUN 25 (H) 6 - 23 mg/dL   Creatinine, Ser 1.35 0.40 - 1.50 mg/dL   Glucose, Bld 89 70 - 99 mg/dL   Phosphorus 3.7 2.3 - 4.6 mg/dL   GFR 50.21 (L) >60.00 mL/min   EKG - sinus tachycardia 110s, normal axis, intervals, no acute ST/T changes (diffuse flattening inferiolaterally) overall unchanged from prior.  Assessment & Plan:  This visit occurred during the SARS-CoV-2 public health emergency.  Safety protocols were in place, including screening questions prior to the visit, additional usage of staff PPE, and extensive cleaning of exam room while observing appropriate contact time as indicated for disinfecting solutions.   Problem List Items Addressed This Visit    REM sleep behavior disorder (Chronic)    Appreciate neuro care - now on klonopin.       PSVT (paroxysmal supraventricular tachycardia) ([place])    H/o this. Continue cardizem CD 120mg  daily and flecainide.  Check EKG for tachycardia noted today - sinus tachycardia noted, not consistent with afib.  rec increase water, continue current meds, keep cards f/u next week, ER precautions reviewed.       Hematemesis without nausea - Primary    Episode of isolated hematemesis after acute illness after large Easter Sunday dinner with family - may have overeaten. Several days of spitting up blood tinged mucous but none in the past 2 days. Anticipate blood came from burst capillary/blood vessel that has since healed. He is not currently on any blood thinner. I did ask him to start taking omeprazole 20mg  daily for the net 2-3 weeks (was only on PRN). Update if recurrence to consider GI referral.       GERD (gastroesophageal reflux disease)    Managed with PRN omeprazole - rec increase to daily for 2-3 wks after recent isolated hematemesis.        Dementia with Lewy bodies (CODE) ([place])    Recent diagnosis - mild currently. Appreciate neurology care Retail banker). To consider Exelon if progressive memory trouble noted.       Relevant Medications   QUEtiapine (SEROQUEL) 100 MG tablet   Cellulitis of face    Anticipate bacterial infection ?MRSA possibly from contaminated razor - rec change razors. Wound culture attempted, start doxycycline 7d course. ddx viral infection (herpetic in h/o cold sores)      Relevant Orders   WOUND CULTURE   Bilateral hearing loss   Atrial fibrillation (HCC) (Chronic)    Question of this - pending [facility] cards/local cards consultation. Check EKG today for noted tachycardia.  Continue dilt, flecainide. Currently off any anticoagulant.        Other Visit Diagnoses    Tachycardia       Relevant Orders   EKG 12-Lead (Completed)       Meds ordered this encounter  Medications  . doxycycline (VIBRA-TABS) 100 MG tablet    Sig: Take 1 tablet (100 mg total) by mouth 2 (two) times daily.    Dispense:  14 tablet    Refill:  0   Orders Placed This Encounter  Procedures  . WOUND CULTURE    Order Specific Question:   Source    Answer:   pustules below R nare  . EKG 12-Lead   Patient Instructions  I think you have infection below nose - treat with doxycycline antibiotic sent to pharmacy - take twice daily with meals for 7 days.  Start taking omeprazole 20mg  daily for the next 2-3 weeks.  Let us know if any recurrent vomiting or blood  when spitting up.  EKG today - increase water intake, keep appointment with Dr Rockey Situ next week. If you start feeling worse, or chest pain or shortness of breath, seek urgent care or go to ER.    Follow up plan: No follow-ups on file.  Ria Bush, MD

## 2019-12-19 NOTE — Patient Instructions (Addendum)
I think you have infection below nose - treat with doxycycline antibiotic sent to pharmacy - take twice daily with meals for 7 days.  Start taking omeprazole 20mg  daily for the next 2-3 weeks.  Let us know if any recurrent vomiting or blood when spitting up.  EKG today - increase water intake, keep appointment with Dr Rockey Situ next week. If you start feeling worse, or chest pain or shortness of breath, seek urgent care or go to ER.

## 2019-12-21 DIAGNOSIS — L03211 Cellulitis of face: Secondary | ICD-10-CM

## 2019-12-21 HISTORY — DX: Cellulitis of face: L03.211

## 2019-12-21 NOTE — Assessment & Plan Note (Signed)
Managed with PRN omeprazole - rec increase to daily for 2-3 wks after recent isolated hematemesis.

## 2019-12-21 NOTE — Assessment & Plan Note (Signed)
Anticipate bacterial infection ?MRSA possibly from contaminated razor - rec change razors. Wound culture attempted, start doxycycline 7d course. ddx viral infection (herpetic in h/o cold sores)

## 2019-12-21 NOTE — Assessment & Plan Note (Addendum)
Question of this - pending [facility] cards/local cards consultation. Check EKG today for noted tachycardia.  Continue dilt, flecainide. Currently off any anticoagulant.

## 2019-12-21 NOTE — Assessment & Plan Note (Addendum)
H/o this. Continue cardizem CD 120mg  daily and flecainide.  Check EKG for tachycardia noted today - SVT noted, not afib. Overall compensated.  rec increase water, continue current meds, keep cards f/u next week, ER precautions reviewed.

## 2019-12-21 NOTE — Assessment & Plan Note (Signed)
Episode of isolated hematemesis after acute illness after large Easter Sunday dinner with family - may have overeaten. Several days of spitting up blood tinged mucous but none in the past 2 days. Anticipate blood came from burst capillary/blood vessel that has since healed. He is not currently on any blood thinner. I did ask him to start taking omeprazole 20mg  daily for the net 2-3 weeks (was only on PRN). Update if recurrence to consider GI referral.

## 2019-12-21 NOTE — Assessment & Plan Note (Addendum)
Recent diagnosis - mild currently. Appreciate neurology care [employer]). To consider Exelon if progressive memory trouble noted.

## 2019-12-21 NOTE — Assessment & Plan Note (Signed)
Appreciate neuro care - now on klonopin.

## 2019-12-22 LAB — WOUND CULTURE
MICRO NUMBER:: 10341910
SPECIMEN QUALITY:: ADEQUATE

## 2019-12-24 ENCOUNTER — Other Ambulatory Visit: Payer: Self-pay

## 2019-12-24 ENCOUNTER — Encounter: Payer: Self-pay | Admitting: Cardiovascular Disease

## 2019-12-24 ENCOUNTER — Ambulatory Visit (INDEPENDENT_AMBULATORY_CARE_PROVIDER_SITE_OTHER): Payer: Medicare Other | Admitting: Cardiovascular Disease

## 2019-12-24 VITALS — BP 124/74 | HR 61 | Ht 70.0 in | Wt 204.2 lb

## 2019-12-24 DIAGNOSIS — I471 Supraventricular tachycardia: Secondary | ICD-10-CM

## 2019-12-24 DIAGNOSIS — I10 Essential (primary) hypertension: Secondary | ICD-10-CM | POA: Diagnosis not present

## 2019-12-24 DIAGNOSIS — I2510 Atherosclerotic heart disease of native coronary artery without angina pectoris: Secondary | ICD-10-CM

## 2019-12-24 DIAGNOSIS — E785 Hyperlipidemia, unspecified: Secondary | ICD-10-CM

## 2019-12-24 DIAGNOSIS — I700 Atherosclerosis of aorta: Secondary | ICD-10-CM | POA: Diagnosis not present

## 2019-12-24 NOTE — Patient Instructions (Signed)

## 2019-12-24 NOTE — Progress Notes (Signed)
Cardiology Office Note  Date:  12/24/2019   ID:  Abrar, Lynde [birth year], MRN [id number]  PCP:  Ria Bush, MD   Chief Complaint  Patient presents with  . OTHER    Early f/u before trip to [facility] no complaints today. Meds reviewed verbally with pt.    HPI:  Michael [name] is a pleasant 86 year old gentleman with history of Three-vessel CAD on CT scan Moderate diffuse aortic athero  total knee replacement,  renal dysfunction,  SVT previously seen by Dr. Rayann Heman and plan was made for SVT ablation. in the end he changed his mind and canceled the procedure He presents today for follow-up of his SVT and CAD  In follow-up today reports he is doing relatively well  Denies any tachycardia or palpitations concerning for arrhythmia   Reports having a sore in his nose, Seen by primary care, possible cellulitis, started on antibiotics  Denies any orthostasis Rare dizzy  Biggest issues are early mild dementia Does a lot of reading Has hearing aides Has trouble watching TV, even with captions, difficulty comprehending what they are saying  Goes to [place], chornic lower extremity swelling Wears compression hose  Chronic insomnia On seroquel and klonopin, melatonin Has chanting in head, difficult to " turn it off"  Discussion concerning desire by cardiology at the [place] to start him on Eliquis And may have done a Holter showing SVT, was told they should be on Eliquis We do not have any of that data  Episode of tachycardia September 2020 Likely with SVT on 05/24/2019, 3 hours,  rate 140, with associated hypotension Symptoms similar to prior SVT episodes  Previously declined ablation  Lab work reviewed with him Total chol 143, LDL 65  EKG on today's visit shows normal sinus rhythm with rate 61 beats a minute, no significant ST or T-wave changes  Other past medical history reviewed CT scan September 2017 for pneumonia reviewed with him in detail extensive coronary calcifications,  all 3 coronary vessels , at least moderate diffuse aortic atherosclerosis  Previous 30 day monitor  showed runs of SVT, heart rate up to 177 beats per minute associated with dizziness. Continued to have runs of SVT even on diltiazem 120 mg daily   PMH:   has a past medical history of Arthritis, Aspiration pneumonia ([place]), BPH (benign prostatic hypertrophy), Chronic back pain, CKD (chronic kidney disease), stage III, Colon cancer ([place]) (1988), Diverticulosis, Essential hypertension, GERD (gastroesophageal reflux disease), H/O calcium pyrophosphate deposition disease (CPPD), Hiatal hernia (01/06/2015), HLD (hyperlipidemia), HOH (hard of hearing), Impotence of organic origin, Peritonsillar abscess determined by examination (05/15/2019), PSVT (paroxysmal supraventricular tachycardia) ([place]), REM sleep 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  PSH:    Past Surgical History:  Procedure Laterality Date  . EYE SURGERY Bilateral    cataract extraction with IOL bilaterally  . [place]  . INGUINAL HERNIA REPAIR Right 1963  . MASTECTOMY, PARTIAL Left 02/07/2013   Procedure: LEFT SUBCUTANEOUS MASTECTOMY;  Surgeon: Imogene Burn. Georgette Dover, MD;  Location: [place];  Service: General;  Laterality: Left;  . ROTATOR CUFF REPAIR Right     [facility]  . ROTATOR CUFF REPAIR Right   . TOTAL KNEE ARTHROPLASTY  03/05/2012   Procedure: TOTAL KNEE ARTHROPLASTY;  Surgeon: Gearlean Alf, MD;  Location: [facility];  Service: Orthopedics;  Laterality: Left;  . [place] [facility], x 4  .  VEIN LIGATION AND STRIPPING Right     Current Outpatient Medications  Medication Sig Dispense Refill  . Calcium Carb-Cholecalciferol (CALCIUM+D3 PO) Take 1 tablet by mouth daily.    . Cholecalciferol (VITAMIN D3 PO) Take 125 mcg by mouth daily. Vitamin D3    . clonazePAM (KLONOPIN) 0.5 MG  tablet One or two pills at bedtime 60 tablet 5  . Cyanocobalamin (B-12 PO) Take 500 mcg by mouth daily. Vitamin B12    . diltiazem (CARDIZEM CD) 120 MG 24 hr capsule Take 1 capsule (120 mg total) by mouth daily. 90 capsule 3  . doxycycline (VIBRA-TABS) 100 MG tablet Take 1 tablet (100 mg total) by mouth 2 (two) times daily. 14 tablet 0  . flecainide (TAMBOCOR) 100 MG tablet TAKE 1 TABLET BY MOUTH TWICE A DAY 180 tablet 3  . gabapentin (NEURONTIN) 100 MG capsule Take 100 mg by mouth as needed.     . lovastatin (MEVACOR) 20 MG tablet TAKE 1 TABLET BY MOUTH ONCE DAILY 90 tablet 2  . Melatonin 10 MG CAPS Take 1 capsule by mouth at bedtime. 30 capsule   . Multiple Vitamins-Minerals (CENTRUM SILVER ADULT 50+ PO) Take 1 tablet by mouth daily. Centrum Silver    . Multiple Vitamins-Minerals (ZINC PO) Take 50 mg by mouth daily. Zinc    . omeprazole (PRILOSEC) 20 MG capsule TAKE 1 CAPSULE BY MOUTH EVERY DAY AS NEEDED. 90 capsule 3  . oxyCODONE-acetaminophen (PERCOCET/ROXICET) 5-325 MG tablet Take 1 tablet by mouth daily as needed for severe pain.    Marland Kitchen QUEtiapine (SEROQUEL) 100 MG tablet Take 50 mg by mouth at bedtime.     . triamcinolone cream (KENALOG) 0.1 % Apply 1 application topically as needed.     No current facility-administered medications for this visit.    Allergies:   Patient has no known allergies.   Social History:  The patient  reports that he has never smoked. He has never used smokeless tobacco. He reports current alcohol use of about 1.0 standard drinks of alcohol per week. He reports that he does not use drugs.   Family History:   family history includes Congestive Heart Failure in his mother; Osteoarthritis in his father.    Review of Systems: Review of Systems  Respiratory: Negative.   Cardiovascular: Negative.   Gastrointestinal: Negative.   Musculoskeletal: Positive for neck pain.  Neurological: Positive for dizziness.  Psychiatric/Behavioral: Negative.   All other systems  reviewed and are negative.   PHYSICAL EXAM: VS:  BP 124/74 (BP Location: Left Arm, Patient Position: Sitting, Cuff Size: Normal)   Pulse 61   Ht 5\' 10"  (1.778 m)   Wt 204 lb 4 oz (92.6 kg)   SpO2 98%   BMI 29.31 kg/m  , BMI Body mass index is 29.31 kg/m. Constitutional:  oriented to person, place, and time. No distress.  Hard of hearing HENT:  Head: Grossly normal Eyes:  no discharge. No scleral icterus.  Neck: No JVD, no carotid bruits  Cardiovascular: Regular rate and rhythm, no murmurs appreciated Trace pitting lower extremity edema, compression hose in place Pulmonary/Chest: Clear to auscultation bilaterally, no wheezes or rails Abdominal: Soft.  no distension.  no tenderness.  Musculoskeletal: Normal range of motion Neurological:  normal muscle tone. Coordination normal. No atrophy Skin: Skin warm and dry Psychiatric: normal affect, pleasant   Recent Labs: 03/29/2019: TSH 2.20 05/15/2019: ALT 22; Hemoglobin 13.0; Platelets 248 06/07/2019: BUN 25; Creatinine, Ser 1.35; Potassium 3.4; Sodium 140    Lipid Panel Lab  Results  Component Value Date   CHOL 141 06/07/2019   HDL 78.30 06/07/2019   LDLCALC 53 06/07/2019   TRIG 48.0 06/07/2019      Wt Readings from Last 3 Encounters:  12/24/19 204 lb 4 oz (92.6 kg)  12/19/19 202 lb 8 oz (91.9 kg)  12/18/19 203 lb 8 oz (92.3 kg)      ASSESSMENT AND PLAN:  Hyperlipidemia LDL  - Plan: EKG 12-Lead Goal LDL less than 70 given severe coronary disease, PAD On statin Numbers at goal  SVT (supraventricular tachycardia) (HCC) - Denies having recent episodes of SVT No orthostasis, dizzy episodes Continue diltiazem and flecainide Previously declined ablation Was started on Eliquis by physician at the [facility], unclear reasons He stopped the Eliquis 7 months ago  Coronary artery disease involving native coronary artery of native heart without angina pectoris Heavy coronary calcification seen on CT scan Low risk stress test August  2018 Continue aggressive lipid management  Aortic atherosclerosis ([place]) Moderate diffuse disease on CT scan September 2017 Stressed importance of aggressive cholesterol management Cholesterol at goal   Long discussion concerning his arrhythmia, recent infection of his nose, risk and benefit of anticoagulation, leg swelling, dementia  Total encounter time more than 45 minutes  Greater than 50% was spent in counseling and coordination of care with the patient   Disposition:   F/U  12 months   No orders of the defined types were placed in this encounter.    Signed, Esmond Plants, M.D., Ph.D. 12/24/2019  [place], [place]

## 2020-01-09 ENCOUNTER — Encounter: Payer: Self-pay | Admitting: Family Medicine

## 2020-01-16 ENCOUNTER — Ambulatory Visit: Payer: Medicare Other | Admitting: Neurology

## 2020-01-24 DIAGNOSIS — M25512 Pain in left shoulder: Secondary | ICD-10-CM | POA: Diagnosis not present

## 2020-01-24 DIAGNOSIS — M25551 Pain in right hip: Secondary | ICD-10-CM | POA: Diagnosis not present

## 2020-02-12 DIAGNOSIS — M25551 Pain in right hip: Secondary | ICD-10-CM | POA: Diagnosis not present

## 2020-02-12 DIAGNOSIS — M1611 Unilateral primary osteoarthritis, right hip: Secondary | ICD-10-CM | POA: Diagnosis not present

## 2020-02-13 ENCOUNTER — Telehealth: Payer: Self-pay | Admitting: Cardiovascular Disease

## 2020-02-13 NOTE — Telephone Encounter (Signed)
    [facility] Pre-operative Risk Assessment    HEARTCARE STAFF: - Please ensure there is not already an duplicate clearance open for this procedure. - Under Visit Info/Reason for Call, type in Other and utilize the format Clearance MM/DD/YY or Clearance TBD. Do not use dashes or single digits. - If request is for dental extraction, please clarify the # of teeth to be extracted.  Request for surgical clearance:  1. What type of surgery is being performed? Right hip arthroplasty  2. When is this surgery scheduled? 03/24/20  3. What type of clearance is required (medical clearance vs. Pharmacy clearance to hold med vs. Both)? both  4. Are there any medications that need to be held prior to surgery and how long? Not listed, please advise if needed  5. Practice name and name of physician performing surgery? [facility] - Dr Paralee Cancel  6. What is the office phone number? [phone]   7.   What is the office fax number? [phone]   8.   Anesthesia type (None, local, MAC, general) ? Spinal    Ace Gins 02/13/2020, 2:44 PM  _________________________________________________________________   (provider comments below)

## 2020-02-14 NOTE — Telephone Encounter (Signed)
Left message for the patient to call back.

## 2020-02-17 ENCOUNTER — Telehealth: Payer: Self-pay | Admitting: Cardiovascular Disease

## 2020-02-17 ENCOUNTER — Telehealth: Payer: Self-pay

## 2020-02-17 NOTE — Telephone Encounter (Signed)
Received faxed pre op evaluation form from [facility].  Spoke with pt's wife, Festus Holts (on dpr), scheduling OV on 02/19/20 at 1:00.    [Form is in basket on [employer].]

## 2020-02-17 NOTE — Telephone Encounter (Signed)
   Primary Cardiologist: Ida Rogue, MD  Chart reviewed as part of pre-operative protocol coverage. Patient last seen by Dr. Rockey Situ on 12/24/2019. Patient contacted on 02/17/2020 for further pre-op risk assessment. He reported doing well since last office visit. No chest pain, shortness of breath, palpitations, lightheadedness, dizziness, syncope. He has some chronic lower extremity edema but this is stable. No other CHF symptoms. Patient ambulates with a cane but still able to complete > 4.0 METS without anginal symptoms. Per Revised Cardiac Risk Index, considered low risk (although this does not take into account patient's age).  Given past medical history and time since last visit, based on ACC/AHA guidelines, Michael [name] would be at acceptable risk for the planned procedure without further cardiovascular testing.   Patient has a history of SVT. Would continue Diltiazem and Flecainide during per-operative period.   I will route this recommendation to the requesting party via Epic fax function and remove from pre-op pool.  Please call with questions.  Michael Mclean, PA-C 02/17/2020, 2:46 PM

## 2020-02-17 NOTE — Telephone Encounter (Signed)
Follow Up:   Returning Michael [name]'s call from 02-14-20.

## 2020-02-19 ENCOUNTER — Ambulatory Visit (INDEPENDENT_AMBULATORY_CARE_PROVIDER_SITE_OTHER)
Admission: RE | Admit: 2020-02-19 | Discharge: 2020-02-19 | Disposition: A | Discharge status: Home / Self Care | Payer: Medicare Other | Source: Ambulatory Visit | Attending: Family Medicine | Admitting: Family Medicine

## 2020-02-19 ENCOUNTER — Encounter: Payer: Self-pay | Admitting: Family Medicine

## 2020-02-19 ENCOUNTER — Other Ambulatory Visit: Payer: Self-pay

## 2020-02-19 ENCOUNTER — Ambulatory Visit (INDEPENDENT_AMBULATORY_CARE_PROVIDER_SITE_OTHER): Payer: Medicare Other | Admitting: Family Medicine

## 2020-02-19 VITALS — BP 120/68 | HR 73 | Temp 97.8°F | Ht 70.0 in | Wt 205.1 lb

## 2020-02-19 DIAGNOSIS — N1831 Chronic kidney disease, stage 3a: Secondary | ICD-10-CM | POA: Diagnosis not present

## 2020-02-19 DIAGNOSIS — G3183 Dementia with Lewy bodies: Secondary | ICD-10-CM

## 2020-02-19 DIAGNOSIS — Z01818 Encounter for other preprocedural examination: Secondary | ICD-10-CM

## 2020-02-19 DIAGNOSIS — I700 Atherosclerosis of aorta: Secondary | ICD-10-CM | POA: Diagnosis not present

## 2020-02-19 DIAGNOSIS — K219 Gastro-esophageal reflux disease without esophagitis: Secondary | ICD-10-CM

## 2020-02-19 DIAGNOSIS — H9193 Unspecified hearing loss, bilateral: Secondary | ICD-10-CM | POA: Diagnosis not present

## 2020-02-19 DIAGNOSIS — I10 Essential (primary) hypertension: Secondary | ICD-10-CM | POA: Diagnosis not present

## 2020-02-19 DIAGNOSIS — G4752 REM sleep behavior disorder: Secondary | ICD-10-CM | POA: Diagnosis not present

## 2020-02-19 DIAGNOSIS — I471 Supraventricular tachycardia: Secondary | ICD-10-CM

## 2020-02-19 DIAGNOSIS — S51012A Laceration without foreign body of left elbow, initial encounter: Secondary | ICD-10-CM

## 2020-02-19 LAB — COMPREHENSIVE METABOLIC PANEL
ALT: 17 U/L (ref 0–53)
AST: 25 U/L (ref 0–37)
Albumin: 3.8 g/dL (ref 3.5–5.2)
Alkaline Phosphatase: 67 U/L (ref 39–117)
BUN: 38 mg/dL — ABNORMAL HIGH (ref 6–23)
CO2: 31 mEq/L (ref 19–32)
Calcium: 8.7 mg/dL (ref 8.4–10.5)
Chloride: 102 mEq/L (ref 96–112)
Creatinine, Ser: 1.47 mg/dL (ref 0.40–1.50)
GFR: 45.43 mL/min — ABNORMAL LOW (ref >60.00)
Glucose, Bld: 77 mg/dL (ref 70–99)
Potassium: 4.3 mEq/L (ref 3.5–5.1)
Sodium: 139 mEq/L (ref 135–145)
Total Bilirubin: 0.5 mg/dL (ref 0.2–1.2)
Total Protein: 6.3 g/dL (ref 6.0–8.3)

## 2020-02-19 LAB — PROTIME-INR
INR: 1 ratio (ref 0.8–1.0)
Prothrombin Time: 11 s (ref 9.6–13.1)

## 2020-02-19 LAB — POCT URINALYSIS DIPSTICK
Bilirubin, UA: NEGATIVE
Blood, UA: NEGATIVE
Glucose, UA: NEGATIVE
Ketones, UA: NEGATIVE
Leukocytes, UA: NEGATIVE
Nitrite, UA: NEGATIVE
Protein, UA: NEGATIVE
Spec Grav, UA: 1.015 (ref 1.010–1.025)
Urobilinogen, UA: 0.2 E.U./dL
pH, UA: 6 (ref 5.0–8.0)

## 2020-02-19 LAB — CBC WITH DIFFERENTIAL/PLATELET
Basophils Absolute: 0 10*3/uL (ref 0.0–0.1)
Basophils Relative: 0.7 % (ref 0.0–3.0)
Eosinophils Absolute: 0.1 10*3/uL (ref 0.0–0.7)
Eosinophils Relative: 2.4 % (ref 0.0–5.0)
HCT: 36.8 % — ABNORMAL LOW (ref 39.0–52.0)
Hemoglobin: 12.5 g/dL — ABNORMAL LOW (ref 13.0–17.0)
Lymphocytes Relative: 19.6 % (ref 12.0–46.0)
Lymphs Abs: 0.9 10*3/uL (ref 0.7–4.0)
MCHC: 33.9 g/dL (ref 30.0–36.0)
MCV: 99.5 fl (ref 78.0–100.0)
Monocytes Absolute: 0.6 10*3/uL (ref 0.1–1.0)
Monocytes Relative: 12.2 % — ABNORMAL HIGH (ref 3.0–12.0)
Neutro Abs: 3.2 10*3/uL (ref 1.4–7.7)
Neutrophils Relative %: 65.1 % (ref 43.0–77.0)
Platelets: 230 10*3/uL (ref 150.0–400.0)
RBC: 3.69 Mil/uL — ABNORMAL LOW (ref 4.22–5.81)
RDW: 14.8 % (ref 11.5–15.5)
WBC: 4.8 10*3/uL (ref 4.0–10.5)

## 2020-02-19 LAB — BRAIN NATRIURETIC PEPTIDE: Pro B Natriuretic peptide (BNP): 269 pg/mL — ABNORMAL HIGH (ref 0.0–100.0)

## 2020-02-19 LAB — HEMOGLOBIN A1C: Hgb A1c MFr Bld: 4.9 % (ref 4.6–6.5)

## 2020-02-19 NOTE — Assessment & Plan Note (Signed)
Good control on current regimen - continue.

## 2020-02-19 NOTE — Progress Notes (Addendum)
This visit was conducted in person.  BP 120/68 (BP Location: Right Arm, Patient Position: Sitting, Cuff Size: Normal)   Pulse 73   Temp 97.8 F (36.6 C) (Temporal)   Ht 5\' 10"  (1.778 m)   Wt 205 lb 1 oz (93 kg)   SpO2 97%   BMI 29.42 kg/m    CC: preop eval Subjective:    Patient ID: Michael [name], male    DOB: Aug 25, 1932, 86 y.o.   MRN: [id number]  HPI: Michael [name] is a 86 y.o. male presenting on 02/19/2020 for Pre-op Exam (Surgery 03/24/20 for right total hip replacement. Pt accompanied by wife, Festus Holts- temp 97.6.)   Upcoming R total hip replacement surgery by Dr Alvan Dame date TBD at [facility]. Here for preop evaluation and medical clearance.   Has tolerated GETA well in the past (inguinal hernia repair, hemicolectomy 1988, L TKR 2013, R RTC repair 2000s. No post op nausea/vomiting or trouble awakening.   Has had 2 falls recently - due to unsteadiness/imbalance. Tripped going down steps then bending over. Residual L knee abrasion and L elbow skin tear.   Denies chest pain/tightness, dyspnea, HA, dizziness, palpitations. No leg swelling. No fevers/chills, abd pain, nausea/vomiting.   Planned cardiac clearance through cardiology Rockey Situ). Last saw cardiology 12/2019 for h/o SVT, no recent symptoms. ?of afib - thought not likely. Off eliquis since 03/2019, but continues diltiazem and flecainide, previously declined ablation. Known CAD by CT, but had low risk stress test 04/2017.   Sees neurology for lewy body dementia on klonopin for REM sleep behavior disorder and insomnia.   [facility] 100mg  1/2 tab at night time.      Relevant past medical, surgical, family and social history reviewed and updated as indicated. Interim medical history since our last visit reviewed. Allergies and medications reviewed and updated. Outpatient Medications Prior to Visit  Medication Sig Dispense Refill  . Calcium Carb-Cholecalciferol (CALCIUM+D3 PO) Take 1 tablet by mouth daily.    .  Cholecalciferol (VITAMIN D3 PO) Take 125 mcg by mouth daily. Vitamin D3    . clonazePAM (KLONOPIN) 0.5 MG tablet One or two pills at bedtime 60 tablet 5  . Cyanocobalamin (B-12 PO) Take 500 mcg by mouth daily. Vitamin B12    . diltiazem (CARDIZEM CD) 120 MG 24 hr capsule Take 1 capsule (120 mg total) by mouth daily. 90 capsule 3  . flecainide (TAMBOCOR) 100 MG tablet TAKE 1 TABLET BY MOUTH TWICE A DAY 180 tablet 3  . gabapentin (NEURONTIN) 100 MG capsule Take 100 mg by mouth as needed.     . lovastatin (MEVACOR) 20 MG tablet TAKE 1 TABLET BY MOUTH ONCE DAILY 90 tablet 2  . Melatonin 10 MG CAPS Take 1 capsule by mouth at bedtime. 30 capsule   . Multiple Vitamins-Minerals (CENTRUM SILVER ADULT 50+ PO) Take 1 tablet by mouth daily. Centrum Silver    . Multiple Vitamins-Minerals (ZINC PO) Take 50 mg by mouth daily. Zinc    . omeprazole (PRILOSEC) 20 MG capsule TAKE 1 CAPSULE BY MOUTH EVERY DAY AS NEEDED. 90 capsule 3  . oxyCODONE-acetaminophen (PERCOCET/ROXICET) 5-325 MG tablet Take 1 tablet by mouth daily as needed for severe pain.    Marland Kitchen QUEtiapine (SEROQUEL) 100 MG tablet Take 50 mg by mouth at bedtime.     . triamcinolone cream (KENALOG) 0.1 % Apply 1 application topically as needed.    . doxycycline (VIBRA-TABS) 100 MG tablet Take 1 tablet (100 mg total) by mouth 2 (  two) times daily. 14 tablet 0   No facility-administered medications prior to visit.     Per HPI unless specifically indicated in ROS section below Review of Systems Objective:  BP 120/68 (BP Location: Right Arm, Patient Position: Sitting, Cuff Size: Normal)   Pulse 73   Temp 97.8 F (36.6 C) (Temporal)   Ht 5\' 10"  (1.778 m)   Wt 205 lb 1 oz (93 kg)   SpO2 97%   BMI 29.42 kg/m   Wt Readings from Last 3 Encounters:  02/19/20 205 lb 1 oz (93 kg)  12/24/19 204 lb 4 oz (92.6 kg)  12/19/19 202 lb 8 oz (91.9 kg)      Physical Exam Vitals and nursing note reviewed.  Constitutional:      Appearance: Normal appearance. He is  not ill-appearing.  HENT:     Right Ear: Decreased hearing noted.     Left Ear: Decreased hearing noted.     Ears:     Comments: Hearing aides in place Cardiovascular:     Rate and Rhythm: Normal rate and regular rhythm.     Pulses: Normal pulses.     Heart sounds: Normal heart sounds. No murmur.  Pulmonary:     Effort: Pulmonary effort is normal. No respiratory distress.     Breath sounds: Normal breath sounds. No wheezing, rhonchi or rales.  Musculoskeletal:     Right lower leg: No edema.     Left lower leg: No edema.  Skin:    General: Skin is warm and dry.     Findings: Abrasion and lesion present. No erythema.          Comments: Skin tear to L lateral elbow as well as abrasion to anterior L knee - both cleaned and dressed with abx ointment and bandaid.   Neurological:     Mental Status: He is alert.  Psychiatric:        Mood and Affect: Mood normal.        Behavior: Behavior normal.       Results for orders placed or performed in visit on 02/19/20  Comprehensive metabolic panel  Result Value Ref Range   Sodium 139 135 - 145 mEq/L   Potassium 4.3 3.5 - 5.1 mEq/L   Chloride 102 96 - 112 mEq/L   CO2 31 19 - 32 mEq/L   Glucose, Bld 77 70 - 99 mg/dL   BUN 38 (H) 6 - 23 mg/dL   Creatinine, Ser 1.47 0.40 - 1.50 mg/dL   Total Bilirubin 0.5 0.2 - 1.2 mg/dL   Alkaline Phosphatase 67 39 - 117 U/L   AST 25 0 - 37 U/L   ALT 17 0 - 53 U/L   Total Protein 6.3 6.0 - 8.3 g/dL   Albumin 3.8 3.5 - 5.2 g/dL   GFR 45.43 (L) >60.00 mL/min   Calcium 8.7 8.4 - 10.5 mg/dL  Hemoglobin A1c  Result Value Ref Range   Hgb A1c MFr Bld 4.9 4.6 - 6.5 %  CBC with Differential/Platelet  Result Value Ref Range   WBC 4.8 4.0 - 10.5 K/uL   RBC 3.69 (L) 4.22 - 5.81 Mil/uL   Hemoglobin 12.5 (L) 13.0 - 17.0 g/dL   HCT 36.8 (L) 39 - 52 %   MCV 99.5 78.0 - 100.0 fl   MCHC 33.9 30.0 - 36.0 g/dL   RDW 14.8 11.5 - 15.5 %   Platelets 230.0 150 - 400 K/uL   Neutrophils Relative % 65.1 43 - 77 %  Lymphocytes Relative 19.6 12 - 46 %   Monocytes Relative 12.2 (H) 3 - 12 %   Eosinophils Relative 2.4 0 - 5 %   Basophils Relative 0.7 0 - 3 %   Neutro Abs 3.2 1.4 - 7.7 K/uL   Lymphs Abs 0.9 0.7 - 4.0 K/uL   Monocytes Absolute 0.6 0 - 1 K/uL   Eosinophils Absolute 0.1 0 - 0 K/uL   Basophils Absolute 0.0 0 - 0 K/uL  Protime-INR  Result Value Ref Range   INR 1.0 0.8 - 1.0 ratio   Prothrombin Time 11.0 9.6 - 13.1 sec  Brain natriuretic peptide  Result Value Ref Range   Pro B Natriuretic peptide (BNP) 269.0 (H) 0.0 - 100.0 pg/mL  POCT urinalysis dipstick  Result Value Ref Range   Color, UA     Clarity, UA     Glucose, UA Negative Negative   Bilirubin, UA neg    Ketones, UA neg    Spec Grav, UA 1.015 1.010 - 1.025   Blood, UA neg    pH, UA 6.0 5.0 - 8.0   Protein, UA Negative Negative   Urobilinogen, UA 0.2 0.2 or 1.0 E.U./dL   Nitrite, UA neg    Leukocytes, UA Negative Negative   Appearance     Odor     DG Chest 2 View CLINICAL DATA:  Preop evaluation  EXAM: CHEST - 2 VIEW  COMPARISON:  10/16/2018  FINDINGS: Cardiac shadow is stable. Aortic calcifications are again seen. The lungs are clear bilaterally. No focal infiltrate is noted. Degenerative change of the thoracic spine is seen.  IMPRESSION: No acute abnormality noted.  Electronically Signed   By: Inez Catalina M.D.   On: 02/20/2020 11:06   Assessment & Plan:  This visit occurred during the SARS-CoV-2 public health emergency.  Safety protocols were in place, including screening questions prior to the visit, additional usage of staff PPE, and extensive cleaning of exam room while observing appropriate contact time as indicated for disinfecting solutions.   Problem List Items Addressed This Visit    Skin tear of elbow without complication    L elbow skin tear with L knee abrasions. Dressed in office, home care reviewed.       REM sleep behavior disorder (Chronic)    Continue nightly klonopin.       PSVT  (paroxysmal supraventricular tachycardia) (HCC)    On dilt and flecainide.  Not thought consistent with afib so not on anticoagulant.       Pre-op evaluation - Primary    RCRI = 0 To receive cardiac clearance through cardiology.  Check labs including A1c, CXR and UA per surgery request. Check BNP to complete RCRI score.  Will forward results to ortho.  Given age agree with in-hospital surgery.       Relevant Orders   DG Chest 2 View (Completed)   Comprehensive metabolic panel (Completed)   Hemoglobin A1c (Completed)   CBC with Differential/Platelet (Completed)   Protime-INR (Completed)   Brain natriuretic peptide (Completed)   POCT urinalysis dipstick (Completed)   GERD (gastroesophageal reflux disease)    Continues omeprazole 20mg  daily.       Essential hypertension (Chronic)    Good control on current regimen - continue.       Dementia with Lewy bodies (CODE) ([place])    Sees neurology, mild LBD.       CKD (chronic kidney disease) stage 3, GFR 30-59 ml/min (HCC) (Chronic)    Update kidney function.  Bilateral hearing loss    Persists despite hearing aide use.           No orders of the defined types were placed in this encounter.  Orders Placed This Encounter  Procedures  . DG Chest 2 View    Standing Status:   Future    Number of Occurrences:   1    Standing Expiration Date:   02/18/2021    Order Specific Question:   Reason for Exam (SYMPTOM  OR DIAGNOSIS REQUIRED)    Answer:   preop eval    Order Specific Question:   Preferred imaging location?    Answer:   [facility]    Order Specific Question:   Radiology Contrast Protocol - do NOT remove file path    Answer:   [email or web address]  . Comprehensive metabolic panel  . Hemoglobin A1c  . CBC with Differential/Platelet  . Protime-INR  . Brain natriuretic peptide  . POCT urinalysis dipstick    Associate with Z13.89    Patient Instructions  xay  today Labs today  Urinalysis today.  We will send results to Dr Alvan Dame. Keep upcoming appointment with Dr Nicole Kindred.    Follow up plan: Return if symptoms worsen or fail to improve.  Ria Bush, MD

## 2020-02-19 NOTE — Assessment & Plan Note (Signed)
Persists despite hearing aide use.

## 2020-02-19 NOTE — Assessment & Plan Note (Signed)
Continues omeprazole 53m daily.

## 2020-02-19 NOTE — Assessment & Plan Note (Signed)
Continue nightly klonopin.  

## 2020-02-19 NOTE — Patient Instructions (Addendum)
xay today Labs today  Urinalysis today.  We will send results to Dr Alvan Dame. Keep upcoming appointment with Dr Nicole Kindred.

## 2020-02-19 NOTE — Assessment & Plan Note (Addendum)
Sees neurology, mild LBD.

## 2020-02-19 NOTE — Assessment & Plan Note (Signed)
On dilt and flecainide.  Not thought consistent with afib so not on anticoagulant.

## 2020-02-19 NOTE — Assessment & Plan Note (Signed)
Update kidney function.  

## 2020-02-19 NOTE — Assessment & Plan Note (Addendum)
RCRI = 0 To receive cardiac clearance through cardiology.  Check labs including A1c, CXR and UA per surgery request. Check BNP to complete RCRI score.  Will forward results to ortho.  Given age agree with in-hospital surgery.

## 2020-02-23 DIAGNOSIS — S51019A Laceration without foreign body of unspecified elbow, initial encounter: Secondary | ICD-10-CM | POA: Insufficient documentation

## 2020-02-23 NOTE — Assessment & Plan Note (Signed)
L elbow skin tear with L knee abrasions. Dressed in office, home care reviewed.

## 2020-03-04 ENCOUNTER — Other Ambulatory Visit: Payer: Self-pay

## 2020-03-04 ENCOUNTER — Ambulatory Visit: Payer: Medicare Other | Admitting: Dermatology

## 2020-03-04 DIAGNOSIS — L578 Other skin changes due to chronic exposure to nonionizing radiation: Secondary | ICD-10-CM | POA: Diagnosis not present

## 2020-03-04 DIAGNOSIS — Z85828 Personal history of other malignant neoplasm of skin: Secondary | ICD-10-CM | POA: Diagnosis not present

## 2020-03-04 DIAGNOSIS — L821 Other seborrheic keratosis: Secondary | ICD-10-CM | POA: Diagnosis not present

## 2020-03-04 DIAGNOSIS — L820 Inflamed seborrheic keratosis: Secondary | ICD-10-CM

## 2020-03-04 NOTE — Progress Notes (Signed)
° °  New Patient Visit  Subjective  Michael [name] is a 86 y.o. male who presents for the following: Area of Concern.  Patient presents today for several areas of concern. 1st is a red scaly spot on right forehead, has been there for a few months started to clear 3 days ago, 2nd patient states he has some growths around his eyes and under his arms that get irritated, and 3rd has some spots on his back he would like to have evaluated today.  The following portions of the chart were reviewed this encounter and updated as appropriate:      Review of Systems:  No other skin or systemic complaints except as noted in HPI or Assessment and Plan.  Objective  Well appearing patient in no apparent distress; mood and affect are within normal limits.  All skin waist up examined.  Objective  Left Axilla (3), Right Axilla, Right Temple at [place], Right lower and upper eyelid (3): Erythematous waxy stuck-on papules Small waxy pedunculated flesh papules at Right upper and lower eye lid  Right Lower Sternum:  5 mm bright pink papule   Assessment & Plan  Inflamed seborrheic keratosis (9) Right Lower Sternum; Left Axilla (3); Right Axilla; Right Temple at [facility]; Right lower and upper eyelid (3)  Cryotherapy today. Recheck right lower sternum on f/up. Biopsy if not improved.            Prior to procedure, discussed risks of blister formation, small wound, skin dyspigmentation, or rare scar following cryotherapy.     Destruction of lesion - Left Axilla, Right Axilla, Right Lower Sternum, Right Temple at [place], Right lower and upper eyelid  Destruction method: cryotherapy   Informed consent: discussed and consent obtained   Lesion destroyed using liquid nitrogen: Yes   Region frozen until ice ball extended beyond lesion: Yes   Outcome: patient tolerated procedure well with no complications   Post-procedure details: wound care instructions given      Actinic Damage - diffuse scaly  erythematous macules with underlying dyspigmentation - Recommend daily broad spectrum sunscreen SPF 30+ to sun-exposed areas, reapply every 2 hours as needed.  - Call for new or changing lesions.  Seborrheic Keratoses - Stuck-on, waxy, tan-brown papules and plaques  - Discussed benign etiology and prognosis. - Observe - Call for any changes  History of Skin Cancer   Clear. Observe for recurrence. Call clinic for new or changing lesions.  Recommend regular skin exams, daily broad-spectrum spf 30+ sunscreen use, and photoprotection.     Return in about 3 months (around 06/04/2020) for ISK.  I, Donzetta Kohut, CMA, am acting as scribe for Brendolyn Patty, MD .  Documentation: I have reviewed the above documentation for accuracy and completeness, and I agree with the above.  Brendolyn Patty MD

## 2020-03-04 NOTE — Patient Instructions (Addendum)
Recommend daily broad spectrum sunscreen SPF 30+ to sun-exposed areas, reapply every 2 hours as needed. Call for new or changing lesions.  Seborrheic Keratosis  What causes seborrheic keratoses? Seborrheic keratoses are harmless, common skin growths that first appear during adult life.  As time goes by, more growths appear.  Some people may develop a large number of them.  Seborrheic keratoses appear on both covered and uncovered body parts.  They are not caused by sunlight.  The tendency to develop seborrheic keratoses can be inherited.  They vary in color from skin-colored to gray, brown, or even black.  They can be either smooth or have a rough, warty surface.   Seborrheic keratoses are superficial and look as if they were stuck on the skin.  Under the microscope this type of keratosis looks like layers upon layers of skin.  That is why at times the top layer may seem to fall off, but the rest of the growth remains and re-grows.    Treatment Seborrheic keratoses do not need to be treated, but can easily be removed in the office.  Seborrheic keratoses often cause symptoms when they rub on clothing or jewelry.  Lesions can be in the way of shaving.  If they become inflamed, they can cause itching, soreness, or burning.  Removal of a seborrheic keratosis can be accomplished by freezing, burning, or surgery. If any spot bleeds, scabs, or grows rapidly, please return to have it checked, as these can be an indication of a skin cancer.   Cryotherapy Aftercare  . Wash gently with soap and water everyday.   Marland Kitchen Apply Vaseline and Band-Aid daily until healed.   Prior to procedure, discussed risks of blister formation, small wound, skin dyspigmentation, or rare scar following cryotherapy.

## 2020-03-12 NOTE — Patient Instructions (Addendum)
DUE TO COVID-19 ONLY ONE VISITOR IS ALLOWED TO COME WITH YOU AND STAY IN THE WAITING ROOM ONLY DURING PRE OP AND PROCEDURE DAY OF SURGERY. THE 1 VISITOR MAY VISIT WITH YOU AFTER SURGERY IN YOUR PRIVATE ROOM DURING VISITING HOURS ONLY!  YOU NEED TO HAVE A COVID 19 TEST ON: 03/20/20@  2:30 pm , THIS TEST MUST BE DONE BEFORE SURGERY, COME  [address], [place] , [id number].  ([place]) ONCE YOUR COVID TEST IS COMPLETED, PLEASE BEGIN THE QUARANTINE INSTRUCTIONS AS OUTLINED IN YOUR HANDOUT.                HARI CASAUS    Your procedure is scheduled on: 03/24/20   Report to [facility] Main  Entrance   Report to admitting at: 7:30 AM     Call this number if you have problems the morning of surgery [phone]    Remember:   NO SOLID FOOD AFTER MIDNIGHT THE NIGHT PRIOR TO SURGERY. NOTHING BY MOUTH EXCEPT CLEAR LIQUIDS UNTIL: 7:00 am . PLEASE FINISH ENSURE DRINK PER SURGEON ORDER  WHICH NEEDS TO BE COMPLETED AT: 7:00 am .   CLEAR LIQUID DIET   Foods Allowed                                                                     Foods Excluded  Coffee and tea, regular and decaf                             liquids that you cannot  Plain Jell-O any favor except red or purple                                           see through such as: Fruit ices (not with fruit pulp)                                     milk, soups, orange juice  Iced Popsicles                                    All solid food Carbonated beverages, regular and diet                                    Cranberry, grape and apple juices Sports drinks like Gatorade Lightly seasoned clear broth or consume(fat free) Sugar, honey syrup  Sample Menu Breakfast                                Lunch                                     Supper Cranberry juice  Beef broth                            Chicken broth Jell-O                                     Grape juice                            Apple juice Coffee or tea                        Jell-O                                      Popsicle                                                Coffee or tea                        Coffee or tea  _____________________________________________________________________  BRUSH YOUR TEETH MORNING OF SURGERY AND RINSE YOUR MOUTH OUT, NO CHEWING GUM CANDY OR MINTS.     Take these medicines the morning of surgery with A SIP OF WATER: Diltiazem,flecainide,omeprazole,gabapentin as needed.                                You may not have any metal on your body including hair pins and              piercings  Do not wear jewelry, lotions, powders or perfumes, deodorant             Men may shave face and neck.   Do not bring valuables to the hospital. [place].  Contacts, dentures or bridgework may not be worn into surgery.  Leave suitcase in the car. After surgery it may be brought to your room.     Patients discharged the day of surgery will not be allowed to drive home. IF YOU ARE HAVING SURGERY AND GOING HOME THE SAME DAY, YOU MUST HAVE AN ADULT TO DRIVE YOU HOME AND BE WITH YOU FOR 24 HOURS. YOU MAY GO HOME BY TAXI OR UBER OR ORTHERWISE, BUT AN ADULT MUST ACCOMPANY YOU HOME AND STAY WITH YOU FOR 24 HOURS.  Name and phone number of your driver:  Special Instructions: N/A              Please read over the following fact sheets you were given: _____________________________________________________________________  [facility] - Preparing for Surgery Before surgery, you can play an important role.  Because skin is not sterile, your skin needs to be as free of germs as possible.  You can reduce the number of germs on your skin by washing with CHG (chlorahexidine gluconate) soap before surgery.  CHG is an antiseptic cleaner which kills germs and bonds with the skin to continue killing germs even after washing. Please DO  NOT use if you have an  allergy to CHG or antibacterial soaps.  If your skin becomes reddened/irritated stop using the CHG and inform your nurse when you arrive at Short Stay. Do not shave (including legs and underarms) for at least 48 hours prior to the first CHG shower.  You may shave your face/neck. Please follow these instructions carefully:  1.  Shower with CHG Soap the night before surgery and the  morning of Surgery.  2.  If you choose to wash your hair, wash your hair first as usual with your  normal  shampoo.  3.  After you shampoo, rinse your hair and body thoroughly to remove the  shampoo.                           4.  Use CHG as you would any other liquid soap.  You can apply chg directly  to the skin and wash                       Gently with a scrungie or clean washcloth.  5.  Apply the CHG Soap to your body ONLY FROM THE NECK DOWN.   Do not use on face/ open                           Wound or open sores. Avoid contact with eyes, ears mouth and genitals (private parts).                       Wash face,  Genitals (private parts) with your normal soap.             6.  Wash thoroughly, paying special attention to the area where your surgery  will be performed.  7.  Thoroughly rinse your body with warm water from the neck down.  8.  DO NOT shower/wash with your normal soap after using and rinsing off  the CHG Soap.                9.  Pat yourself dry with a clean towel.            10.  Wear clean pajamas.            11.  Place clean sheets on your bed the night of your first shower and do not  sleep with pets. Day of Surgery : Do not apply any lotions/deodorants the morning of surgery.  Please wear clean clothes to the hospital/surgery center.  FAILURE TO FOLLOW THESE INSTRUCTIONS MAY RESULT IN THE CANCELLATION OF YOUR SURGERY PATIENT SIGNATURE_________________________________  NURSE  SIGNATURE__________________________________  ________________________________________________________________________   Adam Phenix  An incentive spirometer is a tool that can help keep your lungs clear and active. This tool measures how well you are filling your lungs with each breath. Taking long deep breaths may help reverse or decrease the chance of developing breathing (pulmonary) problems (especially infection) following:  A long period of time when you are unable to move or be active. BEFORE THE PROCEDURE   If the spirometer includes an indicator to show your best effort, your nurse or respiratory therapist will set it to a desired goal.  If possible, sit up straight or lean slightly forward. Try not to slouch.  Hold the incentive spirometer in an upright position. INSTRUCTIONS FOR USE  1. Sit on the edge of your bed if  possible, or sit up as far as you can in bed or on a chair. 2. Hold the incentive spirometer in an upright position. 3. Breathe out normally. 4. Place the mouthpiece in your mouth and seal your lips tightly around it. 5. Breathe in slowly and as deeply as possible, raising the piston or the ball toward the top of the column. 6. Hold your breath for 3-5 seconds or for as long as possible. Allow the piston or ball to fall to the bottom of the column. 7. Remove the mouthpiece from your mouth and breathe out normally. 8. Rest for a few seconds and repeat Steps 1 through 7 at least 10 times every 1-2 hours when you are awake. Take your time and take a few normal breaths between deep breaths. 9. The spirometer may include an indicator to show your best effort. Use the indicator as a goal to work toward during each repetition. 10. After each set of 10 deep breaths, practice coughing to be sure your lungs are clear. If you have an incision (the cut made at the time of surgery), support your incision when coughing by placing a pillow or rolled up towels firmly  against it. Once you are able to get out of bed, walk around indoors and cough well. You may stop using the incentive spirometer when instructed by your caregiver.  RISKS AND COMPLICATIONS  Take your time so you do not get dizzy or light-headed.  If you are in pain, you may need to take or ask for pain medication before doing incentive spirometry. It is harder to take a deep breath if you are having pain. AFTER USE  Rest and breathe slowly and easily.  It can be helpful to keep track of a log of your progress. Your caregiver can provide you with a simple table to help with this. If you are using the spirometer at home, follow these instructions: [place] IF:   You are having difficultly using the spirometer.  You have trouble using the spirometer as often as instructed.  Your pain medication is not giving enough relief while using the spirometer.  You develop fever of 100.5 F (38.1 C) or higher. SEEK IMMEDIATE MEDICAL CARE IF:   You cough up bloody sputum that had not been present before.  You develop fever of 102 F (38.9 C) or greater.  You develop worsening pain at or near the incision site. MAKE SURE YOU:   Understand these instructions.  Will watch your condition.  Will get help right away if you are not doing well or get worse. Document Released: 01/09/2007 Document Revised: 11/21/2011 Document Reviewed: 03/12/2007 [facility] Patient Information 2014 [place], [place].   ________________________________________________________________________

## 2020-03-13 ENCOUNTER — Other Ambulatory Visit: Payer: Self-pay

## 2020-03-13 ENCOUNTER — Encounter (HOSPITAL_COMMUNITY): Payer: Self-pay

## 2020-03-13 ENCOUNTER — Encounter (HOSPITAL_COMMUNITY)
Admission: RE | Admit: 2020-03-13 | Discharge: 2020-03-13 | Disposition: A | Discharge status: Home / Self Care | Payer: Medicare Other | Source: Ambulatory Visit | Attending: Orthopedic Surgery | Admitting: Orthopedic Surgery

## 2020-03-13 DIAGNOSIS — Z01812 Encounter for preprocedural laboratory examination: Secondary | ICD-10-CM | POA: Diagnosis not present

## 2020-03-13 LAB — BASIC METABOLIC PANEL
Anion gap: 11 (ref 5–15)
BUN: 42 mg/dL — ABNORMAL HIGH (ref 8–23)
CO2: 30 mmol/L (ref 22–32)
Calcium: 8.7 mg/dL — ABNORMAL LOW (ref 8.9–10.3)
Chloride: 98 mmol/L (ref 98–111)
Creatinine, Ser: 1.94 mg/dL — ABNORMAL HIGH (ref 0.61–1.24)
GFR calc Af Amer: 36 mL/min — ABNORMAL LOW (ref >60)
GFR calc non Af Amer: 31 mL/min — ABNORMAL LOW (ref >60)
Glucose, Bld: 100 mg/dL — ABNORMAL HIGH (ref 70–99)
Potassium: 4.1 mmol/L (ref 3.5–5.1)
Sodium: 139 mmol/L (ref 135–145)

## 2020-03-13 LAB — CBC
HCT: 38.3 % — ABNORMAL LOW (ref 39.0–52.0)
Hemoglobin: 12.5 g/dL — ABNORMAL LOW (ref 13.0–17.0)
MCH: 33.2 pg (ref 26.0–34.0)
MCHC: 32.6 g/dL (ref 30.0–36.0)
MCV: 101.9 fL — ABNORMAL HIGH (ref 80.0–100.0)
Platelets: 225 10*3/uL (ref 150–400)
RBC: 3.76 MIL/uL — ABNORMAL LOW (ref 4.22–5.81)
RDW: 13.3 % (ref 11.5–15.5)
WBC: 6.6 10*3/uL (ref 4.0–10.5)
nRBC: 0 % (ref 0.0–0.2)

## 2020-03-13 LAB — TYPE AND SCREEN
ABO/RH(D): O POS
Antibody Screen: NEGATIVE

## 2020-03-13 LAB — SURGICAL PCR SCREEN
MRSA, PCR: NEGATIVE
Staphylococcus aureus: NEGATIVE

## 2020-03-13 NOTE — Progress Notes (Signed)
COVID Vaccine Completed:yes Date COVID Vaccine completed:10/24/19 COVID vaccine manufacturer: [place]   PCP - Dr. Ria Bush. LOV: 02/19/20. Cardiologist - Dr. Ida Rogue. Clearance: 02/13/20  Chest x-ray - 02/20/20. EPIC EKG - 12/24/19. EPIC Stress Test -  ECHO - 2015 Cardiac Cath -   Sleep Study -  CPAP -   Fasting Blood Sugar -  Checks Blood Sugar _____ times a day  Blood Thinner Instructions: Aspirin Instructions: Last Dose:  Anesthesia review:  Hx: SVT,HTN  Patient denies shortness of breath, fever, cough and chest pain at PAT appointment   Patient verbalized understanding of instructions that were given to them at the PAT appointment. Patient was also instructed that they will need to review over the PAT instructions again at home before surgery.

## 2020-03-13 NOTE — Progress Notes (Signed)
Lab results: Cr: 1.94

## 2020-03-19 NOTE — Progress Notes (Signed)
Anesthesia Chart Review   Case: [id number] Date/Time: 03/24/20 0946   Procedure: TOTAL HIP ARTHROPLASTY ANTERIOR APPROACH (Right Hip) - 70 mins   Anesthesia type: Spinal   Pre-op diagnosis: Right hip osteoarthritis   Location[facility] ROOM 09 / [facility]   Surgeons: Paralee Cancel, MD      DISCUSSION:86 y.o. never smoker with h/o HLD, GERD, PSVT, HTN, CKD Stage III, right hip OA scheduled for above procedure 03/24/2020 with Dr. Paralee Cancel.   Per cardiology preoperative risk assessment 02/17/2020, "Chart reviewed as part of pre-operative protocol coverage. Patient last seen by Dr. Rockey Situ on 12/24/2019. Patient contacted on 02/17/2020 for further pre-op risk assessment. He reported doing well since last office visit. No chest pain, shortness of breath, palpitations, lightheadedness, dizziness, syncope. He has some chronic lower extremity edema but this is stable. No other CHF symptoms. Patient ambulates with a cane but still able to complete > 4.0 METS without anginal symptoms. Per Revised Cardiac Risk Index, considered low risk (although this does not take into account patient's age). Given past medical history and time since last visit, based on ACC/AHA guidelines, Michael [name] would be at acceptable risk for the planned procedure without further cardiovascular testing.  Patient has a history of SVT. Would continue Diltiazem and Flecainide during per-operative period."  Anticipate pt can proceed with planned procedure barring acute status change.   VS: There were no vitals taken for this visit.  PROVIDERS: Ria Bush, MD is PCP   Ida Rogue, MD is Cardiologist last seen 12/24/2019 LABS: Labs reviewed: Acceptable for surgery. (all labs ordered are listed, but only abnormal results are displayed)  Labs Reviewed  CBC - Abnormal; Notable for the following components:      Result Value   RBC 3.76 (*)    Hemoglobin 12.5 (*)    HCT 38.3 (*)    MCV 101.9 (*)    All other components within  normal limits  BASIC METABOLIC PANEL - Abnormal; Notable for the following components:   Glucose, Bld 100 (*)    BUN 42 (*)    Creatinine, Ser 1.94 (*)    Calcium 8.7 (*)    GFR calc non Af Amer 31 (*)    GFR calc Af Amer 36 (*)    All other components within normal limits  SURGICAL PCR SCREEN  TYPE AND SCREEN     IMAGES:   EKG: 12/24/2019 Rate 61 bpm Sinus rhythm with 1st degree AV block  Nonspecific ST and T wave abnormality  Prolonged QT  CV: Stress Test 05/08/2017  There was no ST segment deviation noted during stress.  No T wave inversion was noted during stress.  Defect 1: There is a small defect of mild severity present in the apex location. This defect is fixed and has normal wall motion and thus most likely is an artifact.  The study is normal.  This is a low risk study.  The left ventricular ejection fraction is normal (55-65%).  Echo 05/23/2014 Study Conclusions   - Left ventricle: The cavity size was normal. There was mild  concentric hypertrophy. Systolic function was normal. Wall motion  was normal; there were no regional wall motion abnormalities.  Doppler parameters are consistent with abnormal left ventricular  relaxation (grade 1 diastolic dysfunction).  - Mitral valve: There was mild regurgitation.  - Tricuspid valve: There was trivial regurgitation.  - Pulmonary arteries: Systolic pressure was within the normal  range.   Impressions:   - Frequent PVCs noted.  Past Medical History:  Diagnosis Date  . Arthritis   . Aspiration pneumonia ([place])   . BPH (benign prostatic hypertrophy)   . Cellulitis of face 12/21/2019  . Chronic back pain   . CKD (chronic kidney disease), stage III    Stage III kidney disease  . Colon cancer [facility]) 1988   Surgery alone, no history of chemo  . Diverticulosis   . Essential hypertension   . GERD (gastroesophageal reflux disease)   . H/O calcium pyrophosphate deposition disease (CPPD)   . Hiatal  hernia 01/06/2015  . HLD (hyperlipidemia)   . HOH (hard of hearing)    bilateral hearing aids  . Impotence of organic origin   . Peritonsillar abscess 05/16/2019  . Peritonsillar abscess determined by examination 05/15/2019   S/p abx, I&D by ENT 05/2019  . PSVT (paroxysmal supraventricular tachycardia) ([place])    a. 04/2014 Event Monitor: Freq bouts of SVT with rates to 180-->seen by EP with recommendation for RFCA, pt deferred;  b. 05/2014 Echo: EF nl, mild conc LVH, no rwma, Gr1 DD, mildMR/TR.  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 Rotator cuff rupture    Right; s/p repair  . SVT (supraventricular tachycardia) ([place])   . Unspecified hearing loss   . Ventral hernia 1988    Past Surgical History:  Procedure Laterality Date  . EYE SURGERY Bilateral    cataract extraction with IOL bilaterally  . [place]  . INGUINAL HERNIA REPAIR Right 1963  . MASTECTOMY, PARTIAL Left 02/07/2013   LEFT SUBCUTANEOUS MASTECTOMY; Imogene Burn. Georgette Dover, [place] Right 2000    [facility]  . TOTAL KNEE ARTHROPLASTY  03/05/2012   Procedure: TOTAL KNEE ARTHROPLASTY;  Surgeon: Gearlean Alf, MD;  Location: [facility];  Service: Orthopedics;  Laterality: Left;  . [place] Surgery, x 4  . VEIN LIGATION AND STRIPPING Right     MEDICATIONS: . Calcium Carb-Cholecalciferol (CALCIUM+D3 PO)  . Cholecalciferol (VITAMIN D3) 125 MCG (5000 UT) CAPS  . clonazePAM (KLONOPIN) 0.5 MG tablet  . Cyanocobalamin (B-12 PO)  . diltiazem (CARDIZEM CD) 120 MG 24 hr capsule  . flecainide (TAMBOCOR) 100 MG tablet  . gabapentin (NEURONTIN) 100 MG capsule  . ibuprofen (ADVIL) 200 MG tablet  . lovastatin (MEVACOR) 20 MG tablet  . Melatonin 10 MG CAPS  . Multiple Vitamins-Minerals (CENTRUM SILVER ADULT 50+ PO)  . Multiple Vitamins-Minerals (ZINC PO)  . omeprazole (PRILOSEC) 20 MG capsule  . oxyCODONE-acetaminophen (PERCOCET/ROXICET) 5-325 MG  tablet  . QUEtiapine (SEROQUEL) 100 MG tablet  . triamcinolone cream (KENALOG) 0.1 %  . zinc gluconate 50 MG tablet   No current facility-administered medications for this encounter.    [name], PA-C [facility] Pre-Surgical Testing [phone] 03/19/20  1:40 PM

## 2020-03-20 ENCOUNTER — Other Ambulatory Visit (HOSPITAL_COMMUNITY)
Admission: RE | Admit: 2020-03-20 | Discharge: 2020-03-20 | Disposition: A | Discharge status: Home / Self Care | Payer: Medicare Other | Source: Ambulatory Visit | Attending: Orthopedic Surgery | Admitting: Orthopedic Surgery

## 2020-03-20 DIAGNOSIS — Z01812 Encounter for preprocedural laboratory examination: Secondary | ICD-10-CM | POA: Insufficient documentation

## 2020-03-20 DIAGNOSIS — Z20822 Contact with and (suspected) exposure to covid-19: Secondary | ICD-10-CM | POA: Insufficient documentation

## 2020-03-20 LAB — SARS CORONAVIRUS 2 (TAT 6-24 HRS): SARS Coronavirus 2: NEGATIVE

## 2020-03-23 NOTE — Progress Notes (Signed)
Pt. And his wife were notified about time change for surgery.Pt. will drink ensure at 9:50 am.Also pt. Will be at admitting at 10:30 am.

## 2020-03-24 ENCOUNTER — Observation Stay (HOSPITAL_COMMUNITY): Payer: Medicare Other

## 2020-03-24 ENCOUNTER — Encounter (HOSPITAL_COMMUNITY): Payer: Self-pay | Admitting: Orthopedic Surgery

## 2020-03-24 ENCOUNTER — Ambulatory Visit (HOSPITAL_COMMUNITY): Payer: Medicare Other | Admitting: Physician Assistant

## 2020-03-24 ENCOUNTER — Ambulatory Visit (HOSPITAL_COMMUNITY): Payer: Medicare Other | Admitting: Certified Registered Nurse Anesthetist

## 2020-03-24 ENCOUNTER — Inpatient Hospital Stay (HOSPITAL_COMMUNITY)
Admission: RE | Admit: 2020-03-24 | Discharge: 2020-04-03 | DRG: 470 | Disposition: A | Discharge status: Skilled Nursing Facility | Payer: Medicare Other | Source: Ambulatory Visit | Attending: Orthopedic Surgery | Admitting: Orthopedic Surgery

## 2020-03-24 ENCOUNTER — Encounter (HOSPITAL_COMMUNITY)
Admission: RE | Disposition: A | Discharge status: Skilled Nursing Facility | Payer: Self-pay | Source: Ambulatory Visit | Attending: Orthopedic Surgery

## 2020-03-24 ENCOUNTER — Ambulatory Visit (HOSPITAL_COMMUNITY): Payer: Medicare Other

## 2020-03-24 ENCOUNTER — Other Ambulatory Visit: Payer: Self-pay

## 2020-03-24 DIAGNOSIS — E669 Obesity, unspecified: Secondary | ICD-10-CM | POA: Diagnosis present

## 2020-03-24 DIAGNOSIS — I129 Hypertensive chronic kidney disease with stage 1 through stage 4 chronic kidney disease, or unspecified chronic kidney disease: Secondary | ICD-10-CM | POA: Diagnosis present

## 2020-03-24 DIAGNOSIS — Z9012 Acquired absence of left breast and nipple: Secondary | ICD-10-CM | POA: Diagnosis not present

## 2020-03-24 DIAGNOSIS — F0280 Dementia in other diseases classified elsewhere without behavioral disturbance: Secondary | ICD-10-CM | POA: Diagnosis present

## 2020-03-24 DIAGNOSIS — Z79899 Other long term (current) drug therapy: Secondary | ICD-10-CM | POA: Diagnosis not present

## 2020-03-24 DIAGNOSIS — F5104 Psychophysiologic insomnia: Secondary | ICD-10-CM | POA: Diagnosis present

## 2020-03-24 DIAGNOSIS — Z20822 Contact with and (suspected) exposure to covid-19: Secondary | ICD-10-CM | POA: Diagnosis present

## 2020-03-24 DIAGNOSIS — N183 Chronic kidney disease, stage 3 unspecified: Secondary | ICD-10-CM | POA: Diagnosis not present

## 2020-03-24 DIAGNOSIS — N401 Enlarged prostate with lower urinary tract symptoms: Secondary | ICD-10-CM | POA: Diagnosis not present

## 2020-03-24 DIAGNOSIS — Z96641 Presence of right artificial hip joint: Secondary | ICD-10-CM

## 2020-03-24 DIAGNOSIS — Z96649 Presence of unspecified artificial hip joint: Secondary | ICD-10-CM

## 2020-03-24 DIAGNOSIS — Z96659 Presence of unspecified artificial knee joint: Secondary | ICD-10-CM | POA: Diagnosis not present

## 2020-03-24 DIAGNOSIS — G8929 Other chronic pain: Secondary | ICD-10-CM | POA: Diagnosis present

## 2020-03-24 DIAGNOSIS — I700 Atherosclerosis of aorta: Secondary | ICD-10-CM | POA: Diagnosis not present

## 2020-03-24 DIAGNOSIS — Z9049 Acquired absence of other specified parts of digestive tract: Secondary | ICD-10-CM

## 2020-03-24 DIAGNOSIS — Z974 Presence of external hearing-aid: Secondary | ICD-10-CM

## 2020-03-24 DIAGNOSIS — Z419 Encounter for procedure for purposes other than remedying health state, unspecified: Secondary | ICD-10-CM

## 2020-03-24 DIAGNOSIS — I2510 Atherosclerotic heart disease of native coronary artery without angina pectoris: Secondary | ICD-10-CM | POA: Diagnosis not present

## 2020-03-24 DIAGNOSIS — Z471 Aftercare following joint replacement surgery: Secondary | ICD-10-CM | POA: Diagnosis not present

## 2020-03-24 DIAGNOSIS — G3183 Dementia with Lewy bodies: Secondary | ICD-10-CM | POA: Diagnosis present

## 2020-03-24 DIAGNOSIS — E785 Hyperlipidemia, unspecified: Secondary | ICD-10-CM | POA: Diagnosis not present

## 2020-03-24 DIAGNOSIS — Z6829 Body mass index (BMI) 29.0-29.9, adult: Secondary | ICD-10-CM

## 2020-03-24 DIAGNOSIS — Z85038 Personal history of other malignant neoplasm of large intestine: Secondary | ICD-10-CM | POA: Diagnosis not present

## 2020-03-24 DIAGNOSIS — K219 Gastro-esophageal reflux disease without esophagitis: Secondary | ICD-10-CM | POA: Diagnosis not present

## 2020-03-24 DIAGNOSIS — M1611 Unilateral primary osteoarthritis, right hip: Principal | ICD-10-CM | POA: Diagnosis present

## 2020-03-24 DIAGNOSIS — H9193 Unspecified hearing loss, bilateral: Secondary | ICD-10-CM | POA: Diagnosis not present

## 2020-03-24 DIAGNOSIS — N138 Other obstructive and reflux uropathy: Secondary | ICD-10-CM | POA: Diagnosis present

## 2020-03-24 HISTORY — PX: TOTAL HIP ARTHROPLASTY: SHX124

## 2020-03-24 LAB — SARS CORONAVIRUS 2 BY RT PCR (HOSPITAL ORDER, PERFORMED IN ~~LOC~~ HOSPITAL LAB): SARS Coronavirus 2: NEGATIVE

## 2020-03-24 SURGERY — ARTHROPLASTY, HIP, TOTAL, ANTERIOR APPROACH
Anesthesia: Spinal | Site: Hip | Laterality: Right

## 2020-03-24 MED ORDER — CEFAZOLIN SODIUM-DEXTROSE 2-4 GM/100ML-% IV SOLN
2.0000 g | Freq: Four times a day (QID) | INTRAVENOUS | Status: AC
  Administered 2020-03-24 – 2020-03-25 (×2): 2 g via INTRAVENOUS
  Filled 2020-03-24 (×2): qty 100

## 2020-03-24 MED ORDER — ALUM & MAG HYDROXIDE-SIMETH 200-200-20 MG/5ML PO SUSP
15.0000 mL | ORAL | Status: DC | PRN
  Administered 2020-03-25 – 2020-04-02 (×9): 15 mL via ORAL
  Filled 2020-03-24 (×9): qty 30

## 2020-03-24 MED ORDER — DIPHENHYDRAMINE HCL 12.5 MG/5ML PO ELIX: 12.5000 mg | ORAL_SOLUTION | ORAL | Status: DC | PRN

## 2020-03-24 MED ORDER — BISACODYL 10 MG RE SUPP: 10.0000 mg | Freq: Every day | RECTAL | Status: DC | PRN

## 2020-03-24 MED ORDER — HYDROCODONE-ACETAMINOPHEN 7.5-325 MG PO TABS
ORAL_TABLET | ORAL | Status: AC
  Filled 2020-03-24: qty 1

## 2020-03-24 MED ORDER — FERROUS SULFATE 325 (65 FE) MG PO TABS
325.0000 mg | ORAL_TABLET | Freq: Three times a day (TID) | ORAL | Status: DC
  Administered 2020-03-25 – 2020-04-03 (×29): 325 mg via ORAL
  Filled 2020-03-24 (×29): qty 1

## 2020-03-24 MED ORDER — CLONAZEPAM 0.5 MG PO TABS
0.5000 mg | ORAL_TABLET | Freq: Every day | ORAL | Status: DC
  Administered 2020-03-24 – 2020-03-25 (×2): 1 mg via ORAL
  Administered 2020-03-26: 0.5 mg via ORAL
  Administered 2020-03-27 – 2020-04-02 (×7): 1 mg via ORAL
  Filled 2020-03-24: qty 2
  Filled 2020-03-24: qty 1
  Filled 2020-03-24 (×8): qty 2

## 2020-03-24 MED ORDER — CEFAZOLIN SODIUM-DEXTROSE 2-4 GM/100ML-% IV SOLN
INTRAVENOUS | Status: AC
  Filled 2020-03-24: qty 100

## 2020-03-24 MED ORDER — TRANEXAMIC ACID-NACL 1000-0.7 MG/100ML-% IV SOLN
1000.0000 mg | INTRAVENOUS | Status: DC
  Filled 2020-03-24: qty 100

## 2020-03-24 MED ORDER — ONDANSETRON HCL 4 MG/2ML IJ SOLN
INTRAMUSCULAR | Status: DC | PRN
  Administered 2020-03-24: 4 mg via INTRAVENOUS

## 2020-03-24 MED ORDER — STERILE WATER FOR IRRIGATION IR SOLN
Status: DC | PRN
  Administered 2020-03-24: 1000 mL

## 2020-03-24 MED ORDER — ONDANSETRON HCL 4 MG PO TABS: 4.0000 mg | ORAL_TABLET | Freq: Four times a day (QID) | ORAL | Status: DC | PRN

## 2020-03-24 MED ORDER — BUPIVACAINE IN DEXTROSE 0.75-8.25 % IT SOLN
INTRATHECAL | Status: DC | PRN
  Administered 2020-03-24: 1.4 mL via INTRATHECAL

## 2020-03-24 MED ORDER — PANTOPRAZOLE SODIUM 40 MG PO TBEC
40.0000 mg | DELAYED_RELEASE_TABLET | Freq: Every day | ORAL | Status: DC | PRN
  Administered 2020-03-26 – 2020-04-02 (×7): 40 mg via ORAL
  Filled 2020-03-24 (×8): qty 1

## 2020-03-24 MED ORDER — MENTHOL 3 MG MT LOZG
1.0000 | LOZENGE | OROMUCOSAL | Status: DC | PRN
  Administered 2020-04-02: 3 mg via ORAL
  Filled 2020-03-24 (×2): qty 9

## 2020-03-24 MED ORDER — PROPOFOL 500 MG/50ML IV EMUL
INTRAVENOUS | Status: DC | PRN
  Administered 2020-03-24: 40 ug/kg/min via INTRAVENOUS

## 2020-03-24 MED ORDER — FENTANYL CITRATE (PF) 100 MCG/2ML IJ SOLN
INTRAMUSCULAR | Status: AC
  Filled 2020-03-24: qty 2

## 2020-03-24 MED ORDER — GABAPENTIN 100 MG PO CAPS: 100.0000 mg | ORAL_CAPSULE | Freq: Every day | ORAL | Status: DC | PRN

## 2020-03-24 MED ORDER — FLECAINIDE ACETATE 100 MG PO TABS
100.0000 mg | ORAL_TABLET | Freq: Two times a day (BID) | ORAL | Status: DC
  Administered 2020-03-24 – 2020-04-03 (×20): 100 mg via ORAL
  Filled 2020-03-24 (×21): qty 1

## 2020-03-24 MED ORDER — DEXAMETHASONE SODIUM PHOSPHATE 10 MG/ML IJ SOLN
10.0000 mg | Freq: Once | INTRAMUSCULAR | Status: AC
  Administered 2020-03-24: 10 mg via INTRAVENOUS

## 2020-03-24 MED ORDER — DILTIAZEM HCL ER COATED BEADS 120 MG PO CP24
120.0000 mg | ORAL_CAPSULE | Freq: Every day | ORAL | Status: DC
  Administered 2020-03-25 – 2020-04-03 (×10): 120 mg via ORAL
  Filled 2020-03-24 (×10): qty 1

## 2020-03-24 MED ORDER — FENTANYL CITRATE (PF) 100 MCG/2ML IJ SOLN: 25.0000 ug | INTRAMUSCULAR | Status: DC | PRN

## 2020-03-24 MED ORDER — PHENYLEPHRINE 40 MCG/ML (10ML) SYRINGE FOR IV PUSH (FOR BLOOD PRESSURE SUPPORT)
PREFILLED_SYRINGE | INTRAVENOUS | Status: DC | PRN
  Administered 2020-03-24 (×4): 80 ug via INTRAVENOUS

## 2020-03-24 MED ORDER — CEFAZOLIN SODIUM-DEXTROSE 2-4 GM/100ML-% IV SOLN: 2.0000 g | INTRAVENOUS | Status: DC

## 2020-03-24 MED ORDER — HYDROMORPHONE HCL 1 MG/ML IJ SOLN
0.5000 mg | INTRAMUSCULAR | Status: DC | PRN
  Administered 2020-03-24: 0.5 mg via INTRAVENOUS
  Filled 2020-03-24: qty 1

## 2020-03-24 MED ORDER — METHOCARBAMOL 500 MG PO TABS
500.0000 mg | ORAL_TABLET | Freq: Four times a day (QID) | ORAL | Status: DC | PRN
  Administered 2020-03-24 – 2020-03-27 (×3): 500 mg via ORAL
  Filled 2020-03-24 (×3): qty 1

## 2020-03-24 MED ORDER — HYDROCODONE-ACETAMINOPHEN 5-325 MG PO TABS
1.0000 | ORAL_TABLET | ORAL | Status: DC | PRN
  Administered 2020-03-27 – 2020-03-30 (×7): 2 via ORAL
  Administered 2020-03-30: 1 via ORAL
  Administered 2020-03-30 – 2020-03-31 (×2): 2 via ORAL
  Filled 2020-03-24 (×8): qty 2
  Filled 2020-03-24: qty 1
  Filled 2020-03-24: qty 2

## 2020-03-24 MED ORDER — ACETAMINOPHEN 325 MG PO TABS
325.0000 mg | ORAL_TABLET | Freq: Four times a day (QID) | ORAL | Status: DC | PRN
  Administered 2020-03-29: 650 mg via ORAL
  Administered 2020-03-30: 325 mg via ORAL
  Filled 2020-03-24: qty 1
  Filled 2020-03-24 (×2): qty 2

## 2020-03-24 MED ORDER — HYDROCODONE-ACETAMINOPHEN 7.5-325 MG PO TABS
1.0000 | ORAL_TABLET | ORAL | Status: DC | PRN
  Administered 2020-03-24: 2 via ORAL
  Administered 2020-03-24: 1 via ORAL
  Administered 2020-03-25: 2 via ORAL
  Administered 2020-03-25: 1 via ORAL
  Administered 2020-03-26 – 2020-03-28 (×2): 2 via ORAL
  Administered 2020-03-29 – 2020-04-03 (×4): 1 via ORAL
  Filled 2020-03-24: qty 1
  Filled 2020-03-24: qty 2
  Filled 2020-03-24 (×2): qty 1
  Filled 2020-03-24 (×2): qty 2
  Filled 2020-03-24: qty 1
  Filled 2020-03-24 (×2): qty 2

## 2020-03-24 MED ORDER — ONDANSETRON HCL 4 MG/2ML IJ SOLN: 4.0000 mg | Freq: Four times a day (QID) | INTRAMUSCULAR | Status: DC | PRN

## 2020-03-24 MED ORDER — ASPIRIN 81 MG PO CHEW
81.0000 mg | CHEWABLE_TABLET | Freq: Two times a day (BID) | ORAL | Status: DC
  Administered 2020-03-24 – 2020-04-03 (×20): 81 mg via ORAL
  Filled 2020-03-24 (×20): qty 1

## 2020-03-24 MED ORDER — METHOCARBAMOL 500 MG IVPB - SIMPLE MED
500.0000 mg | Freq: Four times a day (QID) | INTRAVENOUS | Status: DC | PRN
  Filled 2020-03-24: qty 50

## 2020-03-24 MED ORDER — TRANEXAMIC ACID-NACL 1000-0.7 MG/100ML-% IV SOLN
1000.0000 mg | Freq: Once | INTRAVENOUS | Status: AC
  Administered 2020-03-24: 1000 mg via INTRAVENOUS
  Filled 2020-03-24: qty 100

## 2020-03-24 MED ORDER — SODIUM CHLORIDE 0.9 % IV SOLN: INTRAVENOUS | Status: DC

## 2020-03-24 MED ORDER — 0.9 % SODIUM CHLORIDE (POUR BTL) OPTIME
TOPICAL | Status: DC | PRN
  Administered 2020-03-24: 1000 mL

## 2020-03-24 MED ORDER — PRAVASTATIN SODIUM 20 MG PO TABS
20.0000 mg | ORAL_TABLET | Freq: Every day | ORAL | Status: DC
  Administered 2020-03-25 – 2020-04-03 (×10): 20 mg via ORAL
  Filled 2020-03-24 (×10): qty 1

## 2020-03-24 MED ORDER — CHLORHEXIDINE GLUCONATE 0.12 % MT SOLN
15.0000 mL | Freq: Once | OROMUCOSAL | Status: AC
  Administered 2020-03-24: 15 mL via OROMUCOSAL

## 2020-03-24 MED ORDER — QUETIAPINE FUMARATE 50 MG PO TABS
50.0000 mg | ORAL_TABLET | Freq: Every day | ORAL | Status: DC
  Administered 2020-03-24 – 2020-04-02 (×10): 50 mg via ORAL
  Filled 2020-03-24 (×10): qty 1

## 2020-03-24 MED ORDER — ACETAMINOPHEN 10 MG/ML IV SOLN: 1000.0000 mg | Freq: Once | INTRAVENOUS | Status: DC | PRN

## 2020-03-24 MED ORDER — DEXAMETHASONE SODIUM PHOSPHATE 10 MG/ML IJ SOLN
10.0000 mg | Freq: Once | INTRAMUSCULAR | Status: AC
  Administered 2020-03-25: 10 mg via INTRAVENOUS
  Filled 2020-03-24: qty 1

## 2020-03-24 MED ORDER — PHENOL 1.4 % MT LIQD
1.0000 | OROMUCOSAL | Status: DC | PRN
  Filled 2020-03-24: qty 177

## 2020-03-24 MED ORDER — TRANEXAMIC ACID-NACL 1000-0.7 MG/100ML-% IV SOLN
INTRAVENOUS | Status: DC | PRN
Start: 2020-03-24 — End: 2020-03-24
  Administered 2020-03-24: 1000 mg via INTRAVENOUS

## 2020-03-24 MED ORDER — LACTATED RINGERS IV SOLN: INTRAVENOUS | Status: DC

## 2020-03-24 MED ORDER — MAGNESIUM CITRATE PO SOLN: 1.0000 | Freq: Once | ORAL | Status: DC | PRN

## 2020-03-24 MED ORDER — METOCLOPRAMIDE HCL 5 MG PO TABS: 5.0000 mg | ORAL_TABLET | Freq: Three times a day (TID) | ORAL | Status: DC | PRN

## 2020-03-24 MED ORDER — ORAL CARE MOUTH RINSE: 15.0000 mL | Freq: Once | OROMUCOSAL | Status: AC

## 2020-03-24 MED ORDER — ONDANSETRON HCL 4 MG/2ML IJ SOLN: 4.0000 mg | Freq: Once | INTRAMUSCULAR | Status: DC | PRN

## 2020-03-24 MED ORDER — DOCUSATE SODIUM 100 MG PO CAPS
100.0000 mg | ORAL_CAPSULE | Freq: Two times a day (BID) | ORAL | Status: DC
  Administered 2020-03-24 – 2020-04-03 (×20): 100 mg via ORAL
  Filled 2020-03-24 (×20): qty 1

## 2020-03-24 MED ORDER — POLYETHYLENE GLYCOL 3350 17 G PO PACK
17.0000 g | PACK | Freq: Two times a day (BID) | ORAL | Status: DC
  Administered 2020-03-24 – 2020-04-03 (×20): 17 g via ORAL
  Filled 2020-03-24 (×20): qty 1

## 2020-03-24 MED ORDER — PROPOFOL 500 MG/50ML IV EMUL
INTRAVENOUS | Status: DC | PRN
  Administered 2020-03-24 (×2): 20 mg via INTRAVENOUS

## 2020-03-24 MED ORDER — CEFAZOLIN SODIUM-DEXTROSE 2-3 GM-%(50ML) IV SOLR
INTRAVENOUS | Status: DC | PRN
Start: 2020-03-24 — End: 2020-03-24
  Administered 2020-03-24: 2 g via INTRAVENOUS

## 2020-03-24 MED ORDER — METOCLOPRAMIDE HCL 5 MG/ML IJ SOLN: 5.0000 mg | Freq: Three times a day (TID) | INTRAMUSCULAR | Status: DC | PRN

## 2020-03-24 SURGICAL SUPPLY — 49 items
ADH SKN CLS APL DERMABOND .7 (GAUZE/BANDAGES/DRESSINGS) ×1
ARTICULEZE HEAD (Hips) ×2 IMPLANT
BAG DECANTER FOR FLEXI CONT (MISCELLANEOUS) IMPLANT
BAG SPEC THK2 15X12 ZIP CLS (MISCELLANEOUS)
BAG ZIPLOCK 12X15 (MISCELLANEOUS) IMPLANT
BLADE SAG 18X100X1.27 (BLADE) ×2 IMPLANT
BLADE SURG SZ10 CARB STEEL (BLADE) ×4 IMPLANT
COVER PERINEAL POST (MISCELLANEOUS) ×2 IMPLANT
COVER SURGICAL LIGHT HANDLE (MISCELLANEOUS) ×2 IMPLANT
COVER WAND RF STERILE (DRAPES) IMPLANT
CUP ACETBLR 54 OD PINNACLE (Hips) ×1 IMPLANT
DERMABOND ADVANCED (GAUZE/BANDAGES/DRESSINGS) ×1
DERMABOND ADVANCED .7 DNX12 (GAUZE/BANDAGES/DRESSINGS) ×1 IMPLANT
DRAPE STERI IOBAN 125X83 (DRAPES) ×2 IMPLANT
DRAPE U-SHAPE 47X51 STRL (DRAPES) ×4 IMPLANT
DRESSING AQUACEL AG SP 3.5X10 (GAUZE/BANDAGES/DRESSINGS) ×1 IMPLANT
DRSG AQUACEL AG ADV 3.5X10 (GAUZE/BANDAGES/DRESSINGS) ×1 IMPLANT
DRSG AQUACEL AG SP 3.5X10 (GAUZE/BANDAGES/DRESSINGS) ×2
DURAPREP 26ML APPLICATOR (WOUND CARE) ×2 IMPLANT
ELECT REM PT RETURN 15FT ADLT (MISCELLANEOUS) ×2 IMPLANT
ELIMINATOR HOLE APEX DEPUY (Hips) ×1 IMPLANT
GLOVE BIO SURGEON STRL SZ 6 (GLOVE) ×4 IMPLANT
GLOVE BIOGEL PI IND STRL 6.5 (GLOVE) ×1 IMPLANT
GLOVE BIOGEL PI IND STRL 7.5 (GLOVE) ×1 IMPLANT
GLOVE BIOGEL PI IND STRL 8.5 (GLOVE) ×1 IMPLANT
GLOVE BIOGEL PI INDICATOR 6.5 (GLOVE) ×1
GLOVE BIOGEL PI INDICATOR 7.5 (GLOVE) ×1
GLOVE BIOGEL PI INDICATOR 8.5 (GLOVE) ×1
GLOVE ECLIPSE 8.0 STRL XLNG CF (GLOVE) ×4 IMPLANT
GLOVE ORTHO TXT STRL SZ7.5 (GLOVE) ×4 IMPLANT
GOWN STRL REUS W/TWL LRG LVL3 (GOWN DISPOSABLE) ×4 IMPLANT
GOWN STRL REUS W/TWL XL LVL3 (GOWN DISPOSABLE) ×2 IMPLANT
HEAD ARTICULEZE (Hips) IMPLANT
HOLDER FOLEY CATH W/STRAP (MISCELLANEOUS) ×2 IMPLANT
KIT TURNOVER KIT A (KITS) IMPLANT
LINER NEUTRAL 54X36MM PLUS 4 (Hips) ×1 IMPLANT
PACK ANTERIOR HIP CUSTOM (KITS) ×2 IMPLANT
PENCIL SMOKE EVACUATOR (MISCELLANEOUS) IMPLANT
SCREW 6.5MMX30MM (Screw) ×1 IMPLANT
STEM FEM ACTIS STD SZ7 (Nail) ×1 IMPLANT
SUT MNCRL AB 4-0 PS2 18 (SUTURE) ×2 IMPLANT
SUT STRATAFIX 0 PDS 27 VIOLET (SUTURE) ×2
SUT VIC AB 1 CT1 36 (SUTURE) ×6 IMPLANT
SUT VIC AB 2-0 CT1 27 (SUTURE) ×4
SUT VIC AB 2-0 CT1 TAPERPNT 27 (SUTURE) ×2 IMPLANT
SUTURE STRATFX 0 PDS 27 VIOLET (SUTURE) ×1 IMPLANT
TRAY FOLEY MTR SLVR 16FR STAT (SET/KITS/TRAYS/PACK) IMPLANT
WATER STERILE IRR 1000ML POUR (IV SOLUTION) ×2 IMPLANT
YANKAUER SUCT BULB TIP 10FT TU (MISCELLANEOUS) IMPLANT

## 2020-03-24 NOTE — Transfer of Care (Signed)
Immediate Anesthesia Transfer of Care Note  Patient: Michael [name]  Procedure(s) Performed: Procedure(s) with comments: TOTAL HIP ARTHROPLASTY ANTERIOR APPROACH (Right) - 70 mins  Patient Location: PACU  Anesthesia Type:Spinal  Level of Consciousness: awake, alert  and oriented  Airway & Oxygen Therapy: Patient Spontanous Breathing  Post-op Assessment: Report given to RN and Post -op Vital signs reviewed and stable  Post vital signs: Reviewed and stable  Last Vitals:  Vitals:   03/24/20 1020  BP: (!) 146/67  Pulse: 83  Resp: 17  Temp: 36.7 C  SpO2: 90%    Complications: No apparent anesthesia complications

## 2020-03-24 NOTE — Discharge Instructions (Signed)

## 2020-03-24 NOTE — Interval H&P Note (Signed)
History and Physical Interval Note:  03/24/2020 11:30 AM  Michael [name]  has presented today for surgery, with the diagnosis of Right hip osteoarthritis.  The various methods of treatment have been discussed with the patient and family. After consideration of risks, benefits and other options for treatment, the patient has consented to  Procedure(s) with comments: TOTAL HIP ARTHROPLASTY ANTERIOR APPROACH (Right) - 70 mins as a surgical intervention.  The patient's history has been reviewed, patient examined, no change in status, stable for surgery.  I have reviewed the patient's chart and labs.  Questions were answered to the patient's satisfaction.     Mauri Pole

## 2020-03-24 NOTE — H&P (Signed)
TOTAL HIP ADMISSION H&P  Patient is admitted for right total hip arthroplasty.  Subjective:  Chief Complaint: right hip pain  HPI: Michael [name], 86 y.o. male, has a history of pain and functional disability in the right hip(s) due to arthritis and patient has failed non-surgical conservative treatments for greater than 12 weeks to include NSAID's and/or analgesics and activity modification.  Onset of symptoms was gradual starting 2 years ago with gradually worsening course since that time.The patient noted no past surgery on the right hip(s).  Patient currently rates pain in the right hip at 9 out of 10 with activity. Patient has worsening of pain with activity and weight bearing and pain that interfers with activities of daily living. Patient has evidence of joint space narrowing by imaging studies. This condition presents safety issues increasing the risk of falls. He had a tooth extracted last week, and will be on Amoxicillin.  Patient Active Problem List   Diagnosis Date Noted  . Skin tear of elbow without complication 19/50/9326  . Pre-op evaluation 02/19/2020  . Dementia with Lewy bodies (CODE) ([place]) 06/07/2019  . Auditory hallucinations 05/09/2019  . Medicare annual wellness visit, subsequent 05/08/2019  . Health maintenance examination 05/08/2019  . Advanced care planning/counseling discussion 05/08/2019  . Chronic insomnia 02/08/2019  . GERD (gastroesophageal reflux disease) 10/16/2018  . Atrial fibrillation ([place]) 07/27/2017  . Aortic atherosclerosis ([place]) 11/04/2016  . Coronary artery disease involving native coronary artery of native heart without angina pectoris 11/04/2016  . Hiatal hernia 01/06/2015  . Hematemesis without nausea 01/03/2015  . PSVT (paroxysmal supraventricular tachycardia) ([place]) 04/18/2014  . Dizziness 02/21/2014  . 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 Impotence of organic origin   . Essential hypertension 01/08/2010  . BPH with obstruction/lower  urinary tract symptoms 01/07/2010  . Bilateral hearing loss 12/08/2008  . Gait disturbance 12/08/2008  . Hyperlipidemia LDL goal <100 07/23/2008  . CKD (chronic kidney disease) stage 3, GFR 30-59 ml/min (HCC) 07/23/2008  . History of colon cancer 07/22/2008   Past Medical History:  Diagnosis Date  . Arthritis   . Aspiration pneumonia ([place])   . BPH (benign prostatic hypertrophy)   . Cellulitis of face 12/21/2019  . Chronic back pain   . CKD (chronic kidney disease), stage III    Stage III kidney disease  . Colon cancer [facility]) 1988   Surgery alone, no history of chemo  . Diverticulosis   . Essential hypertension   . GERD (gastroesophageal reflux disease)   . H/O calcium pyrophosphate deposition disease (CPPD)   . Hiatal hernia 01/06/2015  . HLD (hyperlipidemia)   . HOH (hard of hearing)    bilateral hearing aids  . Impotence of organic origin   . Peritonsillar abscess 05/16/2019  . Peritonsillar abscess determined by examination 05/15/2019   S/p abx, I&D by ENT 05/2019  . PSVT (paroxysmal supraventricular tachycardia) ([place])    a. 04/2014 Event Monitor: Freq bouts of SVT with rates to 180-->seen by EP with recommendation for RFCA, pt deferred;  b. 05/2014 Echo: EF nl, mild conc LVH, no rwma, Gr1 DD, mildMR/TR.  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 Rotator cuff rupture    Right; s/p repair  . SVT (supraventricular tachycardia) ([place])   . Unspecified hearing loss   . Ventral hernia 1988    Past Surgical History:  Procedure Laterality Date  . EYE SURGERY Bilateral    cataract extraction with IOL bilaterally  . [place]  [place]  . MASTECTOMY, PARTIAL Left 02/07/2013   LEFT SUBCUTANEOUS MASTECTOMY; Imogene Burn. Georgette Dover, [place] Right 2000    [facility]  . TOTAL KNEE ARTHROPLASTY  03/05/2012   Procedure: TOTAL KNEE ARTHROPLASTY;  Surgeon: Gearlean Alf, MD;  Location: [facility] ORS;  Service: Orthopedics;   Laterality: Left;  . [place] Surgery, x 4  . VEIN LIGATION AND STRIPPING Right     No current facility-administered medications for this encounter.   Current Outpatient Medications  Medication Sig Dispense Refill Last Dose  . Calcium Carb-Cholecalciferol (CALCIUM+D3 PO) Take 1 tablet by mouth daily.     . Cholecalciferol (VITAMIN D3) 125 MCG (5000 UT) CAPS Take 5,000 Units by mouth daily.      . clonazePAM (KLONOPIN) 0.5 MG tablet One or two pills at bedtime (Patient taking differently: Take 0.5-1 mg by mouth at bedtime. ) 60 tablet 5   . Cyanocobalamin (B-12 PO) Take 500 mcg by mouth daily.      Marland Kitchen diltiazem (CARDIZEM CD) 120 MG 24 hr capsule Take 1 capsule (120 mg total) by mouth daily. 90 capsule 3   . flecainide (TAMBOCOR) 100 MG tablet TAKE 1 TABLET BY MOUTH TWICE A DAY (Patient taking differently: Take 100 mg by mouth in the morning and at bedtime. ) 180 tablet 3   . gabapentin (NEURONTIN) 100 MG capsule Take 100 mg by mouth daily as needed (pain).      Marland Kitchen lovastatin (MEVACOR) 20 MG tablet TAKE 1 TABLET BY MOUTH ONCE DAILY (Patient taking differently: Take 20 mg by mouth daily. ) 90 tablet 2   . Melatonin 10 MG CAPS Take 1 capsule by mouth at bedtime. (Patient taking differently: Take 10 mg by mouth at bedtime as needed (sleep). ) 30 capsule    . Multiple Vitamins-Minerals (CENTRUM SILVER ADULT 50+ PO) Take 1 tablet by mouth daily. Centrum Silver     . Multiple Vitamins-Minerals (ZINC PO) Take 50 mg by mouth daily.      Marland Kitchen omeprazole (PRILOSEC) 20 MG capsule TAKE 1 CAPSULE BY MOUTH EVERY DAY AS NEEDED. (Patient taking differently: Take 20 mg by mouth daily as needed (acid reflux). ) 90 capsule 3   . oxyCODONE-acetaminophen (PERCOCET/ROXICET) 5-325 MG tablet Take 1 tablet by mouth every 6 (six) hours as needed for severe pain.      Marland Kitchen QUEtiapine (SEROQUEL) 100 MG tablet Take 50 mg by mouth at bedtime.      . triamcinolone cream (KENALOG) 0.1 % Apply 1  application topically daily as needed (rash).      Marland Kitchen ibuprofen (ADVIL) 200 MG tablet Take 200 mg by mouth every 6 (six) hours as needed.     . zinc gluconate 50 MG tablet Take 50 mg by mouth daily.      No Known Allergies  Social History   Tobacco Use  . Smoking status: Never Smoker  . Smokeless tobacco: Never Used  Substance Use Topics  . Alcohol use: Yes    Alcohol/week: 1.0 standard drink    Types: 1 Glasses of wine per week    Comment: Wine (1/2-1 glass 2x/week)    Family History  Problem Relation Age of Onset  . Osteoarthritis Father   . Congestive Heart Failure Mother      Review of Systems  Constitutional: Negative for chills and fever.  Respiratory: Negative for cough and shortness of breath.   Cardiovascular: Negative  for chest pain.  Gastrointestinal: Negative for nausea and vomiting.  Musculoskeletal: Positive for arthralgias.    Objective:  Physical Exam Patient is an 86 year old male.  Well nourished and well developed. General: Alert and oriented x3, cooperative and pleasant, no acute distress. Head: normocephalic, atraumatic, neck supple. Eyes: EOMI. Respiratory: breath sounds clear in all fields, no wheezing, rales, or rhonchi. Cardiovascular: Regular rate and rhythm, no murmurs, gallops or rubs.  Musculoskeletal: Right hip exam: Limited active hip flexion over 90 with pain Pain with passive range of motion of the right hip to basically neutral with elbow tilting and external rotation to 15 Left hip exam: Otherwise normal range of motion without pain or limitations  Calves soft and nontender. Motor function intact in LE. Strength 5/5 LE bilaterally. Neuro: Distal pulses 2+. Sensation to light touch intact in LE. Vital signs in last 24 hours:    Labs:   Estimated body mass index is 29.42 kg/m as calculated from the following:   Height as of 02/19/20: 5\' 10"  (1.778 m).   Weight as of 02/19/20: 93 kg.   Imaging Review Plain radiographs  demonstrate severe degenerative joint disease of the right hip(s). The bone quality appears to be adequate for age and reported activity level.  Assessment/Plan:  End stage arthritis, right hip(s)  The patient history, physical examination, clinical judgement of the provider and imaging studies are consistent with end stage degenerative joint disease of the right hip(s) and total hip arthroplasty is deemed medically necessary. The treatment options including medical management, injection therapy, arthroscopy and arthroplasty were discussed at length. The risks and benefits of total hip arthroplasty were presented and reviewed. The risks due to aseptic loosening, infection, stiffness, dislocation/subluxation,  thromboembolic complications and other imponderables were discussed.  The patient acknowledged the explanation, agreed to proceed with the plan and consent was signed. Patient is being admitted for inpatient treatment for surgery, pain control, PT, OT, prophylactic antibiotics, VTE prophylaxis, progressive ambulation and ADL's and discharge planning.The patient is planning to be discharged home.  Therapy Plans: HEP Disposition: Home with wife Planned DVT Prophylaxis: aspirin 81mg  BID DME needed: none PCP: Dr. Encarnacion Slates, clearance received Cardiologist: Dr. Rockey Situ, clearance received TXA: IV Allergies: NKDA Anesthesia Concerns: none BMI: 29.2 Not diabetic.  Other: On Percocet 5 mg, only 1 tablet daily. Hx of Lewy Body dementia. CKD stage 3. Hx SVT, taken off Eliquis after learning he did not have a fib. Hx of colon cancer 20+ years ago.   Anticipated LOS equal to or greater than 2 midnights due to - Age 27 and older with one or more of the following:  - Obesity  - Expected need for hospital services (PT, OT, Nursing) required for safe  discharge  - Anticipated need for postoperative skilled nursing care or inpatient rehab  - Active co-morbidities: Chronic pain requiring opiods OR    - Unanticipated findings during/Post Surgery: None  - Patient is a high risk of re-admission due to: None   Griffith Citron, PA-C Orthopedic Surgery [facility] [place] [phone]

## 2020-03-24 NOTE — Anesthesia Procedure Notes (Signed)
Spinal  Patient location during procedure: OR Start time: 03/24/2020 12:27 PM End time: 03/24/2020 12:35 PM Staffing Performed: anesthesiologist  Anesthesiologist: Lyn Hollingshead, MD Preanesthetic Checklist Completed: patient identified, IV checked, site marked, risks and benefits discussed, surgical consent, monitors and equipment checked, pre-op evaluation and timeout performed Spinal Block Patient position: sitting Prep: DuraPrep and site prepped and draped Patient monitoring: continuous pulse ox and blood pressure Approach: midline Location: L3-4 Injection technique: single-shot Needle Needle type: Quincke  Needle gauge: 22 G Needle length: 10 cm Needle insertion depth: 5 cm

## 2020-03-24 NOTE — Op Note (Signed)
NAME:  Michael [name]                ACCOUNT NO.: [id number]      MEDICAL RECORD NO.: [id number]      FACILITY:  [facility]      PHYSICIAN:  Mauri Pole  DATE OF BIRTH:  December 31, 1931     DATE OF PROCEDURE:  03/24/2020                                 OPERATIVE REPORT         PREOPERATIVE DIAGNOSIS: Right  hip osteoarthritis.      POSTOPERATIVE DIAGNOSIS:  Right hip osteoarthritis.      PROCEDURE:  Right total hip replacement through an anterior approach   utilizing DePuy THR system, component size 54 mm pinnacle cup, a size 36+4 neutral   Altrex liner, a size 7 standard Actis stem with a 36+5 Articuleze metal head ball.      SURGEON:  Pietro Cassis. Alvan Dame, M.D.      ASSISTANT:  Danae Orleans, PA-C     ANESTHESIA:  Spinal.      SPECIMENS:  None.      COMPLICATIONS:  None.      BLOOD LOSS:  250 cc     DRAINS:  None.      INDICATION OF THE PROCEDURE:  Michael [name] is a 86 y.o. male who had   presented to office for evaluation of right hip pain.  Radiographs revealed   progressive degenerative changes with bone-on-bone   articulation of the  hip joint, including subchondral cystic changes and osteophytes.  The patient had painful limited range of   motion significantly affecting their overall quality of life and function.  The patient was failing to    respond to conservative measures including medications and/or injections and activity modification and at this point was ready   to proceed with more definitive measures.  Consent was obtained for   benefit of pain relief.  Specific risks of infection, DVT, component   failure, dislocation, neurovascular injury, and need for revision surgery were reviewed in the office as well discussion of   the anterior versus posterior approach were reviewed.     PROCEDURE IN DETAIL:  The patient was brought to operative theater.   Once adequate anesthesia, preoperative antibiotics, 2 gm of Ancef, 1 gm of Tranexamic Acid,  and 10 mg of Decadron were administered, the patient was positioned supine on the [employer] table.  Once the patient was safely positioned with adequate padding of boney prominences we predraped out the hip, and used fluoroscopy to confirm orientation of the pelvis.      The right hip was then prepped and draped from proximal iliac crest to   mid thigh with a shower curtain technique.      Time-out was performed identifying the patient, planned procedure, and the appropriate extremity.     An incision was then made 2 cm lateral to the   anterior superior iliac spine extending over the orientation of the   tensor fascia lata muscle and sharp dissection was carried down to the   fascia of the muscle.      The fascia was then incised.  The muscle belly was identified and swept   laterally and retractor placed along the superior neck.  Following   cauterization of the circumflex vessels and removing some pericapsular  fat, a second cobra retractor was placed on the inferior neck.  A T-capsulotomy was made along the line of the   superior neck to the trochanteric fossa, then extended proximally and   distally.  Tag sutures were placed and the retractors were then placed   intracapsular.  We then identified the trochanteric fossa and   orientation of my neck cut and then made a neck osteotomy with the femur on traction.  The femoral   head was removed without difficulty or complication.  Traction was let   off and retractors were placed posterior and anterior around the   acetabulum.      The labrum and foveal tissue were debrided.  I began reaming with a 48 mm   reamer and reamed up to 53 mm reamer with good bony bed preparation and a 54 mm  cup was chosen.  The final 54 mm Pinnacle cup was then impacted under fluoroscopy to confirm the depth of penetration and orientation with respect to   Abduction and forward flexion.  A screw was placed into the ilium followed by the hole eliminator.  The  final   36+4 neutral Altrex liner was impacted with good visualized rim fit.  The cup was positioned anatomically within the acetabular portion of the pelvis.      At this point, the femur was rolled to 100 degrees.  Further capsule was   released off the inferior aspect of the femoral neck.  I then   released the superior capsule proximally.  With the leg in a neutral position the hook was placed laterally   along the femur under the vastus lateralis origin and elevated manually and then held in position using the hook attachment on the bed.  The leg was then extended and adducted with the leg rolled to 100   degrees of external rotation.  Retractors were placed along the medial calcar and posteriorly over the greater trochanter.  Once the proximal femur was fully   exposed, I used a box osteotome to set orientation.  I then began   broaching with the starting chili pepper broach and passed this by hand and then broached up to 7.  With the 7 broach in place I chose a standard neck and did several trial reductions.  The offset was appropriate, leg lengths   appeared to be equal best matched with the +5 head ball trial confirmed radiographically.   Given these findings, I went ahead and dislocated the hip, repositioned all   retractors and positioned the right hip in the extended and abducted position.  The final 7 standard Actis stem was   chosen and it was impacted down to the level of neck cut.  Based on this   and the trial reductions, a final 36+5 Articuleze metal head ball was chosen and   impacted onto a clean and dry trunnion, and the hip was reduced.  The   hip had been irrigated throughout the case again at this point.  I did   reapproximate the superior capsular leaflet to the anterior leaflet   using #1 Vicryl.  The fascia of the   tensor fascia lata muscle was then reapproximated using #1 Vicryl and #0 Stratafix sutures.  The   remaining wound was closed with 2-0 Vicryl and running  4-0 Monocryl.   The hip was cleaned, dried, and dressed sterilely using Dermabond and   Aquacel dressing.  The patient was then brought   to recovery room in  stable condition tolerating the procedure well.    Griffith Citron, PA-C was present for the entirety of the case involved from   preoperative positioning, perioperative retractor management, general   facilitation of the case, as well as primary wound closure as assistant.            Pietro Cassis Alvan Dame, M.D.        03/24/2020 1:49 PM

## 2020-03-24 NOTE — Care Plan (Signed)
Ortho Bundle Case Management Note  Patient Details  Name: REGGINALD PASK MRN: [id number] Date of Birth: September 04, 1932                  R THA on 03/24/20. DCP: Home with spouse, Festus Holts. Lives in 1 story home with 3-4 steps. DME: No needs. Has RW & 3in1. PT: HEP   DME Arranged:  N/A DME Agency:     HH Arranged:    [place] Agency:     Additional Comments: Please contact me with any questions of if this plan should need to change.  Marianne Sofia, RN,CCM [facility]  [phone] 03/24/2020, 11:10 AM

## 2020-03-24 NOTE — Anesthesia Preprocedure Evaluation (Addendum)
Anesthesia Evaluation  Patient identified by MRN, date of birth, ID band Patient awake    Reviewed: Allergy & Precautions, NPO status , Patient's Chart, lab work & pertinent test results  Airway Mallampati: I       Dental no notable dental hx.    Pulmonary    Pulmonary exam normal        Cardiovascular hypertension, Pt. on medications Normal cardiovascular exam     Neuro/Psych Dementia negative neurological ROS     GI/Hepatic GERD  Medicated,  Endo/Other    Renal/GU Renal InsufficiencyRenal disease  negative genitourinary   Musculoskeletal   Abdominal Normal abdominal exam  (+)   Peds  Hematology  (+) anemia ,   Anesthesia Other Findings Notes Recorded by Minna Merritts, MD on 05/23/2014 at 6:12 PM Echo is essentially normal Some extra beats noted      Reproductive/Obstetrics                            Anesthesia Physical Anesthesia Plan  ASA: II  Anesthesia Plan: Spinal   Post-op Pain Management:    Induction:   PONV Risk Score and Plan: 2 and Ondansetron, Treatment may vary due to age or medical condition and Dexamethasone  Airway Management Planned: Natural Airway and Simple Face Mask  Additional Equipment: None  Intra-op Plan:   Post-operative Plan:   Informed Consent: I have reviewed the patients History and Physical, chart, labs and discussed the procedure including the risks, benefits and alternatives for the proposed anesthesia with the patient or authorized representative who has indicated his/her understanding and acceptance.       Plan Discussed with: CRNA  Anesthesia Plan Comments:         Anesthesia Quick Evaluation

## 2020-03-24 NOTE — Anesthesia Postprocedure Evaluation (Signed)
Anesthesia Post Note  Patient: Michael [name]  Procedure(s) Performed: TOTAL HIP ARTHROPLASTY ANTERIOR APPROACH (Right Hip)     Patient location during evaluation: PACU Anesthesia Type: Spinal Level of consciousness: awake and sedated Pain management: pain level controlled Vital Signs Assessment: post-procedure vital signs reviewed and stable Respiratory status: spontaneous breathing Cardiovascular status: stable Postop Assessment: no apparent nausea or vomiting Anesthetic complications: no   No complications documented.  Last Vitals:  Vitals:   03/24/20 1530 03/24/20 1615  BP: (!) 155/78 (!) 158/83  Pulse: 65 72  Resp: 12 17  Temp:  36.8 C  SpO2: 95% 94%    Last Pain:  Vitals:   03/24/20 1600  TempSrc:   PainSc: [place]

## 2020-03-24 NOTE — Progress Notes (Signed)
Pt had covid test completed on 7/9 which was negative. Pt called today stating his son became positive over the weekend after coming to visit for his surgery. Per patient, his son came in over the weekend and started having a cough. He was tested and came back positive. Prior to this, patient and his spouse would wear their mask while in the room with their sone. After finding out their son had covid, pt didn't see his son anymore. Per Dr. Alvan Dame, he would be good with the plan anesthesia has for possible covid test repeat. This nurse talked with Dr. Jillyn Hidden and went over above information. He states he feels we do not want a repeat test.

## 2020-03-25 ENCOUNTER — Encounter (HOSPITAL_COMMUNITY): Payer: Self-pay | Admitting: Orthopedic Surgery

## 2020-03-25 LAB — CBC
HCT: 31.8 % — ABNORMAL LOW (ref 39.0–52.0)
Hemoglobin: 10.5 g/dL — ABNORMAL LOW (ref 13.0–17.0)
MCH: 33.2 pg (ref 26.0–34.0)
MCHC: 33 g/dL (ref 30.0–36.0)
MCV: 100.6 fL — ABNORMAL HIGH (ref 80.0–100.0)
Platelets: 212 10*3/uL (ref 150–400)
RBC: 3.16 MIL/uL — ABNORMAL LOW (ref 4.22–5.81)
RDW: 13.1 % (ref 11.5–15.5)
WBC: 10.4 10*3/uL (ref 4.0–10.5)
nRBC: 0 % (ref 0.0–0.2)

## 2020-03-25 LAB — BASIC METABOLIC PANEL
Anion gap: 9 (ref 5–15)
BUN: 29 mg/dL — ABNORMAL HIGH (ref 8–23)
CO2: 26 mmol/L (ref 22–32)
Calcium: 8.3 mg/dL — ABNORMAL LOW (ref 8.9–10.3)
Chloride: 100 mmol/L (ref 98–111)
Creatinine, Ser: 1.44 mg/dL — ABNORMAL HIGH (ref 0.61–1.24)
GFR calc Af Amer: 51 mL/min — ABNORMAL LOW (ref >60)
GFR calc non Af Amer: 44 mL/min — ABNORMAL LOW (ref >60)
Glucose, Bld: 156 mg/dL — ABNORMAL HIGH (ref 70–99)
Potassium: 4 mmol/L (ref 3.5–5.1)
Sodium: 135 mmol/L (ref 135–145)

## 2020-03-25 MED ORDER — FERROUS SULFATE 325 (65 FE) MG PO TABS: 325.0000 mg | ORAL_TABLET | Freq: Three times a day (TID) | ORAL | 0 refills | Status: DC

## 2020-03-25 MED ORDER — METHOCARBAMOL 500 MG PO TABS: 500.0000 mg | ORAL_TABLET | Freq: Four times a day (QID) | ORAL | 0 refills | Status: DC | PRN

## 2020-03-25 MED ORDER — HYDROCODONE-ACETAMINOPHEN 7.5-325 MG PO TABS: 1.0000 | ORAL_TABLET | ORAL | 0 refills | Status: DC | PRN

## 2020-03-25 MED ORDER — POLYETHYLENE GLYCOL 3350 17 G PO PACK
17.0000 g | PACK | Freq: Two times a day (BID) | ORAL | 0 refills | Status: DC
Start: 2020-03-25 — End: 2020-04-16

## 2020-03-25 MED ORDER — ASPIRIN 81 MG PO CHEW: 81.0000 mg | CHEWABLE_TABLET | Freq: Two times a day (BID) | ORAL | 0 refills | Status: DC

## 2020-03-25 MED ORDER — DOCUSATE SODIUM 100 MG PO CAPS
100.0000 mg | ORAL_CAPSULE | Freq: Two times a day (BID) | ORAL | 0 refills | Status: DC
Start: 2020-03-25 — End: 2020-04-16

## 2020-03-25 MED ORDER — AMOXICILLIN 250 MG PO CAPS
500.0000 mg | ORAL_CAPSULE | Freq: Three times a day (TID) | ORAL | Status: DC
  Administered 2020-03-25 – 2020-04-03 (×28): 500 mg via ORAL
  Filled 2020-03-25: qty 2
  Filled 2020-03-25: qty 1
  Filled 2020-03-25 (×3): qty 2
  Filled 2020-03-25 (×2): qty 1
  Filled 2020-03-25 (×3): qty 2
  Filled 2020-03-25: qty 1
  Filled 2020-03-25 (×12): qty 2
  Filled 2020-03-25 (×2): qty 1
  Filled 2020-03-25 (×2): qty 2
  Filled 2020-03-25 (×2): qty 1

## 2020-03-25 NOTE — Plan of Care (Signed)
Plan of care reviewed and discussed with the patient. 

## 2020-03-25 NOTE — Discharge Summary (Deleted)
Patient ID: Michael [name] MRN: [id number] DOB/AGE: May 24, 1932 86 y.o.  Admit date: 03/24/2020 Discharge date: 03/25/2020  Admission Diagnoses:  Principal Problem:   Osteoarthritis of right hip Active Problems:   Status post right hip replacement   Discharge Diagnoses:  Same  Past Medical History:  Diagnosis Date  . Arthritis   . Aspiration pneumonia ([place])   . BPH (benign prostatic hypertrophy)   . Cellulitis of face 12/21/2019  . Chronic back pain   . CKD (chronic kidney disease), stage III    Stage III kidney disease  . Colon cancer [facility]) 1988   Surgery alone, no history of chemo  . Diverticulosis   . Essential hypertension   . GERD (gastroesophageal reflux disease)   . H/O calcium pyrophosphate deposition disease (CPPD)   . Hiatal hernia 01/06/2015  . HLD (hyperlipidemia)   . HOH (hard of hearing)    bilateral hearing aids  . Impotence of organic origin   . Peritonsillar abscess 05/16/2019  . Peritonsillar abscess determined by examination 05/15/2019   S/p abx, I&D by ENT 05/2019  . PSVT (paroxysmal supraventricular tachycardia) ([place])    a. 04/2014 Event Monitor: Freq bouts of SVT with rates to 180-->seen by EP with recommendation for RFCA, pt deferred;  b. 05/2014 Echo: EF nl, mild conc LVH, no rwma, Gr1 DD, mildMR/TR.  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 Rotator cuff rupture    Right; s/p repair  . SVT (supraventricular tachycardia) ([place])   . Unspecified hearing loss   . Ventral hernia 1988    Surgeries: Procedure(s): RIGHT TOTAL HIP ARTHROPLASTY ANTERIOR APPROACH on 03/24/2020   Consultants: N/A  Discharged Condition: Improved  Hospital Course: AHARON CARRIERE is an 86 y.o. male who was admitted 03/24/2020 for operative treatment ofOsteoarthritis of right hip. Patient has severe unremitting pain that affects sleep, daily activities, and work/hobbies. After pre-op clearance the patient was taken to the operating room on 03/24/2020 and underwent  Procedure(s):  RIGHT TOTAL HIP ARTHROPLASTY ANTERIOR APPROACH.    Patient was given perioperative antibiotics:  Anti-infectives (From admission, onward)   Start     Dose/Rate Route Frequency Ordered Stop   03/24/20 2000  ceFAZolin (ANCEF) IVPB 2g/100 mL premix        2 g 200 mL/hr over 30 Minutes Intravenous Every 6 hours 03/24/20 1649 03/25/20 0144   03/24/20 1016  ceFAZolin (ANCEF) 2-4 GM/100ML-% IVPB       Note to Pharmacy: Marchia Meiers   : cabinet override      03/24/20 1016 03/24/20 1607   03/24/20 1015  ceFAZolin (ANCEF) IVPB 2g/100 mL premix  Status:  Discontinued        2 g 200 mL/hr over 30 Minutes Intravenous On call to O.R. 03/24/20 1002 03/24/20 1637       Patient was given sequential compression devices, early ambulation, and chemoprophylaxis to prevent DVT.  Patient benefited maximally from hospital stay and there were no complications.    Recent vital signs:  Patient Vitals for the past 24 hrs:  BP Temp Temp src Pulse Resp SpO2 Height Weight  03/25/20 0644 124/78 98 F (36.7 C) Oral 89 15 93 % -- --  03/25/20 0129 (!) 150/93 98.2 F (36.8 C) Oral (!) 102 17 97 % -- --  03/24/20 2213 138/87 98.4 F (36.9 C) Oral (!) 102 17 96 % -- --  03/24/20 1944 (!) 149/86 98.4 F (36.9 C) Oral (!) 105 16 96 % -- --  03/24/20 1821 (!) 153/92  98.4 F (36.9 C) Oral (!) 106 14 94 % -- --  03/24/20 1719 (!) 146/77 98.4 F (36.9 C) Oral 74 13 96 % -- --  03/24/20 1645 (!) 177/82 98.3 F (36.8 C) -- 73 16 98 % -- --  03/24/20 1615 (!) 158/83 98.3 F (36.8 C) -- 72 17 94 % -- --  03/24/20 1530 (!) 155/78 -- -- 65 12 95 % -- --  03/24/20 1515 (!) 151/81 -- -- 65 16 91 % -- --  03/24/20 1500 (!) 152/80 -- -- 63 15 92 % -- --  03/24/20 1445 (!) 147/83 -- -- 62 14 97 % -- --  03/24/20 1430 132/75 -- -- 62 11 100 % -- --  03/24/20 1415 116/89 -- -- 63 14 98 % -- --  03/24/20 1412 103/64 97.6 F (36.4 C) -- 64 15 93 % -- --  03/24/20 1020 (!) 146/67 98 F (36.7 C) Oral 83 17 97 % -- --   03/24/20 1010 -- -- -- -- -- -- 5\' 10"  (1.778 m) 91.8 kg     Recent laboratory studies:  Recent Labs    03/25/20 0317  WBC 10.4  HGB 10.5*  HCT 31.8*  PLT 212  NA 135  K 4.0  CL 100  CO2 26  BUN 29*  CREATININE 1.44*  GLUCOSE 156*  CALCIUM 8.3*     Discharge Medications:   Allergies as of 03/25/2020   No Known Allergies     Medication List    STOP taking these medications   ibuprofen 200 MG tablet Commonly known as: ADVIL   oxyCODONE-acetaminophen 5-325 MG tablet Commonly known as: PERCOCET/ROXICET     TAKE these medications   aspirin 81 MG chewable tablet Commonly known as: Aspirin Childrens Chew 1 tablet (81 mg total) by mouth 2 (two) times daily. Take for 4 weeks, then resume regular dose. Start taking on: March 26, 2020   B-12 PO Take 500 mcg by mouth daily.   CALCIUM+D3 PO Take 1 tablet by mouth daily.   CENTRUM SILVER ADULT 50+ PO Take 1 tablet by mouth daily. Centrum Silver   clonazePAM 0.5 MG tablet Commonly known as: KLONOPIN One or two pills at bedtime What changed:   how much to take  how to take this  when to take this  additional instructions   diltiazem 120 MG 24 hr capsule Commonly known as: Cardizem CD Take 1 capsule (120 mg total) by mouth daily.   docusate sodium 100 MG capsule Commonly known as: Colace Take 1 capsule (100 mg total) by mouth 2 (two) times daily.   ferrous sulfate 325 (65 FE) MG tablet Commonly known as: FerrouSul Take 1 tablet (325 mg total) by mouth 3 (three) times daily with meals for 14 days.   flecainide 100 MG tablet Commonly known as: TAMBOCOR TAKE 1 TABLET BY MOUTH TWICE A DAY What changed: when to take this   gabapentin 100 MG capsule Commonly known as: NEURONTIN Take 100 mg by mouth daily as needed (pain).   HYDROcodone-acetaminophen 7.5-325 MG tablet Commonly known as: Norco Take 1-2 tablets by mouth every 4 (four) hours as needed for moderate pain.   lovastatin 20 MG  tablet Commonly known as: MEVACOR TAKE 1 TABLET BY MOUTH ONCE DAILY   Melatonin 10 MG Caps Take 1 capsule by mouth at bedtime. What changed:   how much to take  when to take this  reasons to take this   methocarbamol 500 MG tablet Commonly known  as: Robaxin Take 1 tablet (500 mg total) by mouth every 6 (six) hours as needed for muscle spasms.   omeprazole 20 MG capsule Commonly known as: PRILOSEC TAKE 1 CAPSULE BY MOUTH EVERY DAY AS NEEDED. What changed:   how much to take  how to take this  when to take this  reasons to take this  additional instructions   polyethylene glycol 17 g packet Commonly known as: MIRALAX / GLYCOLAX Take 17 g by mouth 2 (two) times daily.   QUEtiapine 100 MG tablet Commonly known as: SEROQUEL Take 50 mg by mouth at bedtime.   triamcinolone cream 0.1 % Commonly known as: KENALOG Apply 1 application topically daily as needed (rash).   Vitamin D3 125 MCG (5000 UT) Caps Take 5,000 Units by mouth daily.   zinc gluconate 50 MG tablet Take 50 mg by mouth daily.   ZINC PO Take 50 mg by mouth daily.            Discharge Care Instructions  (From admission, onward)         Start     Ordered   03/25/20 0000  Change dressing       Comments: Maintain surgical dressing until follow up in the clinic. If the edges start to pull up, may reinforce with tape. If the dressing is no longer working, may remove and cover with gauze and tape, but must keep the area dry and clean.  Call with any questions or concerns.   03/25/20 0849          Diagnostic Studies: DG Pelvis Portable  Result Date: 03/24/2020 CLINICAL DATA:  Status post right hip replacement. EXAM: PORTABLE PELVIS 1-2 VIEWS COMPARISON:  None. FINDINGS: Status post right hip arthroplasty. No fracture or dislocation is noted. The left hip is unremarkable. IMPRESSION: Status post right hip arthroplasty. Electronically Signed   By: Marijo Conception M.D.   On: 03/24/2020 14:33   DG  C-Arm 1-60 Min-No Report  Result Date: 03/24/2020 Fluoroscopy was utilized by the requesting physician.  No radiographic interpretation.   DG HIP OPERATIVE UNILAT W OR W/O PELVIS RIGHT  Result Date: 03/24/2020 CLINICAL DATA:  Total hip replacement EXAM: OPERATIVE RIGHT HIP   1 VIEW TECHNIQUE: Fluoroscopic spot image(s) were submitted for interpretation post-operatively. FLUOROSCOPY TIME:  0 minutes 12 seconds; 5 acquired images COMPARISON:  None. FINDINGS: Frontal images show prosthetic components placed for total hip replacement. Prosthetic components appear well seated on submitted images. No acute fracture or dislocation evident. Moderate narrowing left hip joint. IMPRESSION: Images show placement of total hip replacement prostheses on the right with prosthetic components appearing well-seated on frontal view. No fracture or dislocation evident. Electronically Signed   By: Lowella Grip III M.D.   On: 03/24/2020 13:50    Disposition: Home  Discharge Instructions    Call MD / Call 911   Complete by: As directed    If you experience chest pain or shortness of breath, CALL 911 and be transported to the hospital emergency room.  If you develope a fever above 101 F, pus (white drainage) or increased drainage or redness at the wound, or calf pain, call your surgeon's office.   Change dressing   Complete by: As directed    Maintain surgical dressing until follow up in the clinic. If the edges start to pull up, may reinforce with tape. If the dressing is no longer working, may remove and cover with gauze and tape, but must keep the area dry  and clean.  Call with any questions or concerns.   Constipation Prevention   Complete by: As directed    Drink plenty of fluids.  Prune juice may be helpful.  You may use a stool softener, such as Colace (over the counter) 100 mg twice a day.  Use MiraLax (over the counter) for constipation as needed.   Diet - low sodium heart healthy   Complete by: As  directed    Discharge instructions   Complete by: As directed    Maintain surgical dressing until follow up in the clinic. If the edges start to pull up, may reinforce with tape. If the dressing is no longer working, may remove and cover with gauze and tape, but must keep the area dry and clean.  Follow up in 2 [facility] at [facility]. Call with any questions or concerns.   Increase activity slowly as tolerated   Complete by: As directed    Weight bearing as tolerated with assist device (walker, cane, etc) as directed, use it as long as suggested by your surgeon or therapist, typically at least 4-6 weeks.   TED hose   Complete by: As directed    Use stockings (TED hose) for 2 weeks on both leg(s).  You may remove them at night for sleeping.       Follow-up Information    Paralee Cancel, MD. Go on 04/08/2020.   Specialty: Orthopedic Surgery Why: You are scheduled for first post op appointment on Wednesday Juy 28th at 3:15pm. Contact information: [address] [place] [address] [phone]                Signed: Lucille Passy [facility] 03/25/2020, 8:50 AM

## 2020-03-25 NOTE — Progress Notes (Signed)
Physical Therapy Treatment Patient Details Name: Michael [name] MRN: [id number] DOB: May 22, 1932 Today's Date: 03/25/2020    History of Present Illness Pt is an 86 year old male s/p R direct anterior THA with hx of pneumonia, HTN, SVT, colon cancer, L TKA    PT Comments    Spouse present for session and educated on safe stair technique.  Pt ambulated in hallway and performed steps however currently requiring min assist at this time. Spouse feels unable to safely assist pt at home if discharged today and also requesting possible SNF placement.  Recommended pt remain tonight and will see how pt performs tomorrow.    Follow Up Recommendations  Home health PT;Follow surgeon's recommendation for DC plan and follow-up therapies     Equipment Recommendations  None recommended by PT    Recommendations for Other Services       Precautions / Restrictions Precautions Precautions: Fall Precaution Comments: very HOH, hears better in right ear Restrictions Weight Bearing Restrictions: No    Mobility  Bed Mobility Overal bed mobility: Needs Assistance Bed Mobility: Supine to Sit     Supine to sit: Min assist;HOB elevated     General bed mobility comments: pt in recliner on arrival  Transfers Overall transfer level: Needs assistance Equipment used: Rolling walker (2 wheeled) Transfers: Sit to/from Stand Sit to Stand: Min assist         General transfer comment: multimodal cues for UE and LE positioning, assist to control descent  Ambulation/Gait Ambulation/Gait assistance: Min guard Gait Distance (Feet): 120 Feet Assistive device: Rolling walker (2 wheeled) Gait Pattern/deviations: Step-to pattern;Decreased stance time - right;Antalgic;Trunk flexed     General Gait Details: verbal cues for sequence, RW positioning, step length, posture   Stairs Stairs: Yes Stairs assistance: Min assist Stair Management: Step to pattern;Backwards;With walker Number of Stairs:  2 General stair comments: educated spouse first and she held RW for pt; pt provided with verbal and tactile cues for sequence and RW positioning; min assist for stability required   Wheelchair Mobility    Modified Rankin (Stroke Patients Only)       Balance                                            Cognition Arousal/Alertness: Awake/alert Behavior During Therapy: WFL for tasks assessed/performed Overall Cognitive Status: Within Functional Limits for tasks assessed                                 General Comments: pt reports word recognition difficulties      Exercises Total Joint Exercises Ankle Circles/Pumps: AROM;Both;10 reps Quad Sets: AROM;Both;10 reps    General Comments        Pertinent Vitals/Pain Pain Assessment: 0-10 Pain Score: 5  Pain Location: right hip Pain Descriptors / Indicators: Burning Pain Intervention(s): Repositioned;Monitored during session    Home Living Family/patient expects to be discharged to:: Private residence Living Arrangements: Spouse/significant other   Type of Home: House Home Access: Stairs to enter Entrance Stairs-Rails: None Home Layout: Able to live on main level with bedroom/bathroom Home Equipment: Walker - 2 wheels      Prior Function Level of Independence: Independent          PT Goals (current goals can now be found in the care plan section) Acute Rehab  PT Goals PT Goal Formulation: With patient Time For Goal Achievement: 04/01/20 Potential to Achieve Goals: Good Progress towards PT goals: Progressing toward goals    Frequency    7X/week      PT Plan Current plan remains appropriate    Co-evaluation              AM-PAC PT "6 Clicks" Mobility   Outcome Measure  Help needed turning from your back to your side while in a flat bed without using bedrails?: A Little Help needed moving from lying on your back to sitting on the side of a flat bed without using  bedrails?: A Little Help needed moving to and from a bed to a chair (including a wheelchair)?: A Little Help needed standing up from a chair using your arms (e.g., wheelchair or bedside chair)?: A Little Help needed to walk in hospital room?: A Little Help needed climbing 3-5 steps with a railing? : A Little 6 Click Score: 18    End of Session Equipment Utilized During Treatment: Gait belt Activity Tolerance: Patient tolerated treatment well Patient left: in chair;with call bell/phone within reach;with chair alarm set;with family/visitor present Nurse Communication: Mobility status PT Visit Diagnosis: Other abnormalities of gait and mobility (R26.89)     Time: 1336-1401 PT Time Calculation (min) (ACUTE ONLY): 25 min  Charges:  $Gait Training: 23-37 mins                    Jannette Spanner PT, DPT Acute Rehabilitation Services Pager: [phone] Office: [phone]  Michael [name] E 03/25/2020, 2:18 PM

## 2020-03-25 NOTE — Progress Notes (Signed)
     Subjective: 1 Day Post-Op Procedure(s) (LRB): TOTAL HIP ARTHROPLASTY ANTERIOR APPROACH (Right)   Patient reports pain as mild.  No reported events throughout the night.  Discussed the procedure and expectations moving forward.  Ready to be discharged home, if they do well with PT.  Follow up in the clinic in 2 weeks.  Knows to call with any questions or concerns.       Objective:   VITALS:   Vitals:   03/25/20 0129 03/25/20 0644  BP: (!) 150/93 124/78  Pulse: (!) 102 89  Resp: 17 15  Temp: 98.2 F (36.8 C) 98 F (36.7 C)  SpO2: 97% 93%    Dorsiflexion/Plantar flexion intact Incision: dressing C/D/I No cellulitis present Compartment soft  LABS Recent Labs    03/25/20 0317  HGB 10.5*  HCT 31.8*  WBC 10.4  PLT 212    Recent Labs    03/25/20 0317  NA 135  K 4.0  BUN 29*  CREATININE 1.44*  GLUCOSE 156*     Assessment/Plan: 1 Day Post-Op Procedure(s) (LRB): TOTAL HIP ARTHROPLASTY ANTERIOR APPROACH (Right) Foley cath d/c'ed Advance diet Up with therapy D/C IV fluids Discharge home Follow up in 2 weeks at [facility] Follow up with OLIN,Corrina Steffensen D in 2 weeks.  Contact information:  [facility] [address] [place] [address] [phone]    Overweight (BMI 25-29.9) Estimated body mass index is 29.04 kg/m as calculated from the following:   Height as of this encounter: 5\' 10"  (1.778 m).   Weight as of this encounter: 91.8 kg. Patient also counseled that weight may inhibit the healing process Patient counseled that losing weight will help with future health issues       Danae Orleans PA-C  [facility]  [place] [address], [place], [place] [address] Phone: [phone] [email or web address] Facebook  [employer]

## 2020-03-25 NOTE — TOC Transition Note (Signed)
Transition of Care [facility]) - CM/SW Discharge Note   Patient Details  Name: Michael [name] MRN: [id number] Date of Birth: 22-Dec-1931  Transition of [facility] CM/SW Contact:  Lia Hopping, March ARB Phone Number: 03/25/2020, 10:48 AM   Clinical Narrative:    [facility] Plan of Care, see CM note.        Patient Goals and CMS Choice        Discharge Placement                       Discharge Plan and Services                DME Arranged: N/A                    Social Determinants of Health (SDOH) Interventions     Readmission Risk Interventions No flowsheet data found.

## 2020-03-25 NOTE — Evaluation (Signed)
Physical Therapy Evaluation Patient Details Name: Michael [name] MRN: [id number] DOB: 1932/02/15 Today's Date: 03/25/2020   History of Present Illness  Pt is an 86 year old male s/p R direct anterior THA with hx of pneumonia, HTN, SVT, colon cancer, L TKA  Clinical Impression  Pt is s/p THA resulting in the deficits listed below (see PT Problem List).  Pt will benefit from skilled PT to increase their independence and safety with mobility to allow discharge to the venue listed below.  Pt very HOH (even with hearing aides in place) so called spouse so she could be present for afternoon session to assist with pt communication and education.  Pt ambulated in hallway and reports pain limiting.  Pt also reports hx of back issues and receives injections.       Follow Up Recommendations Home health PT;Follow surgeon's recommendation for DC plan and follow-up therapies    Equipment Recommendations  None recommended by PT    Recommendations for Other Services       Precautions / Restrictions Precautions Precautions: Fall Precaution Comments: very HOH Restrictions Weight Bearing Restrictions: No      Mobility  Bed Mobility Overal bed mobility: Needs Assistance Bed Mobility: Supine to Sit     Supine to sit: Min assist;HOB elevated     General bed mobility comments: verbal cues for self assist, assist required for R LE, pt utilized elevated HOB and rails  Transfers Overall transfer level: Needs assistance Equipment used: Rolling walker (2 wheeled) Transfers: Sit to/from Stand Sit to Stand: Min assist         General transfer comment: multimodal cues for UE and LE positioning, assist to rise and steady  Ambulation/Gait Ambulation/Gait assistance: Min assist Gait Distance (Feet): 110 Feet Assistive device: Rolling walker (2 wheeled) Gait Pattern/deviations: Step-to pattern;Decreased stance time - right;Antalgic;Trunk flexed     General Gait Details: verbal cues for  sequence, RW positioning, step length, posture  Stairs            Wheelchair Mobility    Modified Rankin (Stroke Patients Only)       Balance                                             Pertinent Vitals/Pain Pain Assessment: 0-10 Pain Score: 4  Pain Location: right hip Pain Descriptors / Indicators: Burning Pain Intervention(s): Repositioned;Monitored during session;Patient requesting pain meds-RN notified    Home Living Family/patient expects to be discharged to:: Private residence Living Arrangements: Spouse/significant other   Type of Home: House Home Access: Stairs to enter Entrance Stairs-Rails: None Entrance Stairs-Number of Steps: 2 Home Layout: Able to live on main level with bedroom/bathroom Home Equipment: Walker - 2 wheels      Prior Function Level of Independence: Independent               Hand Dominance        Extremity/Trunk Assessment        Lower Extremity Assessment Lower Extremity Assessment: RLE deficits/detail RLE Deficits / Details: anticipated post op hip weakness, grossly 2+/5 hip       Communication   Communication: HOH  Cognition Arousal/Alertness: Awake/alert Behavior During Therapy: WFL for tasks assessed/performed Overall Cognitive Status: Difficult to assess  General Comments: pt reports word recognition difficulties      General Comments      Exercises     Assessment/Plan    PT Assessment Patient needs continued PT services  PT Problem List Decreased range of motion;Decreased strength;Decreased mobility;Pain;Decreased knowledge of use of DME;Decreased activity tolerance;Decreased balance       PT Treatment Interventions Stair training;Gait training;DME instruction;Therapeutic exercise;Functional mobility training;Patient/family education;Therapeutic activities    PT Goals (Current goals can be found in the Care Plan section)  Acute  Rehab PT Goals PT Goal Formulation: With patient Time For Goal Achievement: 04/01/20 Potential to Achieve Goals: Good    Frequency 7X/week   Barriers to discharge        Co-evaluation               AM-PAC PT "6 Clicks" Mobility  Outcome Measure Help needed turning from your back to your side while in a flat bed without using bedrails?: A Little Help needed moving from lying on your back to sitting on the side of a flat bed without using bedrails?: A Little Help needed moving to and from a bed to a chair (including a wheelchair)?: A Little Help needed standing up from a chair using your arms (e.g., wheelchair or bedside chair)?: A Little Help needed to walk in hospital room?: A Little Help needed climbing 3-5 steps with a railing? : A Little 6 Click Score: 18    End of Session Equipment Utilized During Treatment: Gait belt Activity Tolerance: Patient tolerated treatment well Patient left: in chair;with call bell/phone within reach;with chair alarm set Nurse Communication: Mobility status PT Visit Diagnosis: Other abnormalities of gait and mobility (R26.89)    Time: 4287-6811 PT Time Calculation (min) (ACUTE ONLY): 28 min   Charges:   PT Evaluation $PT Eval Low Complexity: 1 Low PT Treatments $Gait Training: 8-22 mins   Arlyce Dice, DPT Acute Rehabilitation Services Pager: [phone] Office: [place] E 03/25/2020, 11:07 AM

## 2020-03-26 LAB — BASIC METABOLIC PANEL
Anion gap: 11 (ref 5–15)
BUN: 62 mg/dL — ABNORMAL HIGH (ref 8–23)
CO2: 25 mmol/L (ref 22–32)
Calcium: 8.6 mg/dL — ABNORMAL LOW (ref 8.9–10.3)
Chloride: 100 mmol/L (ref 98–111)
Creatinine, Ser: 1.48 mg/dL — ABNORMAL HIGH (ref 0.61–1.24)
GFR calc Af Amer: 49 mL/min — ABNORMAL LOW (ref >60)
GFR calc non Af Amer: 42 mL/min — ABNORMAL LOW (ref >60)
Glucose, Bld: 142 mg/dL — ABNORMAL HIGH (ref 70–99)
Potassium: 4 mmol/L (ref 3.5–5.1)
Sodium: 136 mmol/L (ref 135–145)

## 2020-03-26 LAB — CBC
HCT: 29 % — ABNORMAL LOW (ref 39.0–52.0)
Hemoglobin: 9.8 g/dL — ABNORMAL LOW (ref 13.0–17.0)
MCH: 33.3 pg (ref 26.0–34.0)
MCHC: 33.8 g/dL (ref 30.0–36.0)
MCV: 98.6 fL (ref 80.0–100.0)
Platelets: 198 10*3/uL (ref 150–400)
RBC: 2.94 MIL/uL — ABNORMAL LOW (ref 4.22–5.81)
RDW: 13.1 % (ref 11.5–15.5)
WBC: 10.8 10*3/uL — ABNORMAL HIGH (ref 4.0–10.5)
nRBC: 0 % (ref 0.0–0.2)

## 2020-03-26 MED ORDER — SODIUM CHLORIDE 0.9 % IV BOLUS
500.0000 mL | Freq: Once | INTRAVENOUS | Status: AC
  Administered 2020-03-26: 500 mL via INTRAVENOUS

## 2020-03-26 NOTE — Progress Notes (Signed)
Physical Therapy Treatment Patient Details Name: Michael [name] MRN: [id number] DOB: 1932-02-09 Today's Date: 03/26/2020    History of Present Illness Pt is an 86 year old male s/p R direct anterior THA with hx of pneumonia, HTN, SVT, colon cancer, L TKA    PT Comments    Happy Michael [name]! Pt reports not feeling very well but agreeable to ambulate before assist back to bed.  Pt ambulated short distance and then repositioned in supine.  Pt reports back pain also limiting.    Prior to session: 116/57 mmHg, 73 bpm Upon return to supine: 109/53 mmHg, 88 bpm   Follow Up Recommendations  Follow surgeon's recommendation for DC plan and follow-up therapies     Equipment Recommendations  None recommended by PT    Recommendations for Other Services       Precautions / Restrictions Precautions Precautions: Fall Precaution Comments: very HOH, hears better in right ear Restrictions Weight Bearing Restrictions: No    Mobility  Bed Mobility Overal bed mobility: Needs Assistance Bed Mobility: Sit to Supine       Sit to supine: Mod assist   General bed mobility comments: assist for LEs onto bed  Transfers Overall transfer level: Needs assistance Equipment used: Rolling walker (2 wheeled) Transfers: Sit to/from Stand Sit to Stand: Min assist         General transfer comment: multimodal cues for UE and LE positioning, assist to control descent  Ambulation/Gait Ambulation/Gait assistance: Min guard Gait Distance (Feet): 40 Feet Assistive device: Rolling walker (2 wheeled) Gait Pattern/deviations: Step-to pattern;Decreased stance time - right;Antalgic;Trunk flexed     General Gait Details: verbal cues for sequence, RW positioning, step length, posture; distance to tolerance; pt fatigued quickly   Stairs             Wheelchair Mobility    Modified Rankin (Stroke Patients Only)       Balance                                             Cognition Arousal/Alertness: Awake/alert Behavior During Therapy: WFL for tasks assessed/performed Overall Cognitive Status: Within Functional Limits for tasks assessed                                 General Comments: pt reports word recognition difficulties      Exercises     General Comments        Pertinent Vitals/Pain Pain Assessment: Faces Faces Pain Scale: Hurts even more Pain Location: right hip Pain Descriptors / Indicators: Burning Pain Intervention(s): Repositioned;Monitored during session;Ice applied    Home Living                      Prior Function            PT Goals (current goals can now be found in the care plan section) Progress towards PT goals: Progressing toward goals    Frequency    7X/week      PT Plan Current plan remains appropriate    Co-evaluation              AM-PAC PT "6 Clicks" Mobility   Outcome Measure  Help needed turning from your back to your side while in a flat bed without using bedrails?: A Little Help  needed moving from lying on your back to sitting on the side of a flat bed without using bedrails?: A Lot Help needed moving to and from a bed to a chair (including a wheelchair)?: A Little Help needed standing up from a chair using your arms (e.g., wheelchair or bedside chair)?: A Little Help needed to walk in hospital room?: A Little Help needed climbing 3-5 steps with a railing? : A Lot 6 Click Score: 16    End of Session Equipment Utilized During Treatment: Gait belt Activity Tolerance: Patient tolerated treatment well Patient left: with call bell/phone within reach;in bed;with bed alarm set Nurse Communication: Mobility status PT Visit Diagnosis: Other abnormalities of gait and mobility (R26.89)     Time: 1444-1500 PT Time Calculation (min) (ACUTE ONLY): 16 min  Charges:  $Gait Training: 8-22 mins $Therapeutic Exercise: 8-22 mins                     Jannette Spanner PT, DPT Acute  Rehabilitation Services Pager: [phone] Office: [phone]  York Ram E 03/26/2020, 3:05 PM

## 2020-03-26 NOTE — Progress Notes (Signed)
Physical Therapy Treatment Patient Details Name: Michael [name] MRN: [id number] DOB: 16-Jan-1932 Today's Date: 03/26/2020    History of Present Illness Pt is an 86 year old male s/p R direct anterior THA with hx of pneumonia, HTN, SVT, colon cancer, L TKA    PT Comments    Pt reports being tired from not sleeping last night due to coughing.  Pt performed exercises in recliner and then attempted to ambulate. Ambulation limited by dizziness, and pt found to have low BP.  BP 80/45 mmHg upon sitting in recliner and after a couple minutes reclined,101/53 mmHg.  RN notified and into room.     Follow Up Recommendations  Follow surgeon's recommendation for DC plan and follow-up therapies     Equipment Recommendations  None recommended by PT    Recommendations for Other Services       Precautions / Restrictions Precautions Precautions: Fall Precaution Comments: very HOH, hears better in right ear Restrictions Weight Bearing Restrictions: No    Mobility  Bed Mobility               General bed mobility comments: pt up in recliner on arrival  Transfers Overall transfer level: Needs assistance Equipment used: Rolling walker (2 wheeled) Transfers: Sit to/from Stand Sit to Stand: Min assist         General transfer comment: multimodal cues for UE and LE positioning, assist to control descent  Ambulation/Gait Ambulation/Gait assistance: Min assist Gait Distance (Feet): 5 Feet Assistive device: Rolling walker (2 wheeled) Gait Pattern/deviations: Step-to pattern;Decreased stance time - right;Antalgic;Trunk flexed     General Gait Details: verbal cues for sequence, RW positioning, step length, posture however pt quickly reported "not feeling well" "a little dizzy" so recliner pulled up behind pt; pt with low BP and RN notified   Stairs             Wheelchair Mobility    Modified Rankin (Stroke Patients Only)       Balance                                             Cognition Arousal/Alertness: Awake/alert Behavior During Therapy: WFL for tasks assessed/performed Overall Cognitive Status: Within Functional Limits for tasks assessed                                 General Comments: pt reports word recognition difficulties      Exercises Total Joint Exercises Ankle Circles/Pumps: AROM;Both;10 reps Quad Sets: AROM;Both;10 reps Heel Slides: AAROM;Right;10 reps Hip ABduction/ADduction: AAROM;Right;10 reps Long Arc Quad: AROM;Right;10 reps;Seated    General Comments        Pertinent Vitals/Pain Pain Assessment: Faces Faces Pain Scale: Hurts little more Pain Location: right hip Pain Descriptors / Indicators: Burning Pain Intervention(s): Repositioned;Monitored during session    Home Living                      Prior Function            PT Goals (current goals can now be found in the care plan section) Progress towards PT goals: Progressing toward goals    Frequency    7X/week      PT Plan Current plan remains appropriate    Co-evaluation  AM-PAC PT "6 Clicks" Mobility   Outcome Measure  Help needed turning from your back to your side while in a flat bed without using bedrails?: A Little Help needed moving from lying on your back to sitting on the side of a flat bed without using bedrails?: A Little Help needed moving to and from a bed to a chair (including a wheelchair)?: A Little Help needed standing up from a chair using your arms (e.g., wheelchair or bedside chair)?: A Little Help needed to walk in hospital room?: A Lot Help needed climbing 3-5 steps with a railing? : A Lot 6 Click Score: 16    End of Session Equipment Utilized During Treatment: Gait belt Activity Tolerance: Treatment limited secondary to medical complications (Comment) Patient left: in chair;with call bell/phone within reach;with chair alarm set;with nursing/sitter in room Nurse  Communication: Mobility status PT Visit Diagnosis: Other abnormalities of gait and mobility (R26.89)     Time: 4462-8638 PT Time Calculation (min) (ACUTE ONLY): 17 min  Charges:  $Therapeutic Exercise: 8-22 mins                    Arlyce Dice, DPT Acute Rehabilitation Services Pager: [phone] Office: [phone]  York Ram E 03/26/2020, 11:39 AM

## 2020-03-26 NOTE — Progress Notes (Signed)
     Subjective: 2 Days Post-Op Procedure(s) (LRB): TOTAL HIP ARTHROPLASTY ANTERIOR APPROACH (Right)   Patient reports pain as mild, pain controlled. He feels that he did well with PT yesterday, but reports state otherwise.  Discussed working with PT again today and hopefully he does better and is safe to d/c home.   Follow up in the clinic in 2 weeks.  Knows to call with any questions or concerns.       Objective:   VITALS:   Vitals:   03/25/20 2158 03/26/20 0508  BP: (!) 163/99 140/63  Pulse: (!) 105 90  Resp: 16 17  Temp: 98.2 F (36.8 C) 98.2 F (36.8 C)  SpO2: 93% 93%    Dorsiflexion/Plantar flexion intact Incision: dressing C/D/I No cellulitis present Compartment soft  LABS Recent Labs    03/25/20 0317 03/26/20 0315  HGB 10.5* 9.8*  HCT 31.8* 29.0*  WBC 10.4 10.8*  PLT 212 198    Recent Labs    03/25/20 0317 03/26/20 0315  NA 135 136  K 4.0 4.0  BUN 29* 62*  CREATININE 1.44* 1.48*  GLUCOSE 156* 142*     Assessment/Plan: 2 Days Post-Op Procedure(s) (LRB): TOTAL HIP ARTHROPLASTY ANTERIOR APPROACH (Right) Up with therapy Discharge home Follow up in 2 weeks at [facility] Follow up with OLIN,Alexsis Branscom D in 2 weeks.  Contact information:  [facility] [address] [place] [phone]             Danae Orleans PA-C  [facility]  [place] [address], [place], [place], [place] [address] Phone: [phone] [email or web address] Facebook  [employer]

## 2020-03-27 DIAGNOSIS — F5104 Psychophysiologic insomnia: Secondary | ICD-10-CM | POA: Diagnosis present

## 2020-03-27 DIAGNOSIS — E785 Hyperlipidemia, unspecified: Secondary | ICD-10-CM | POA: Diagnosis not present

## 2020-03-27 DIAGNOSIS — R0902 Hypoxemia: Secondary | ICD-10-CM | POA: Diagnosis not present

## 2020-03-27 DIAGNOSIS — M6281 Muscle weakness (generalized): Secondary | ICD-10-CM | POA: Diagnosis not present

## 2020-03-27 DIAGNOSIS — N183 Chronic kidney disease, stage 3 unspecified: Secondary | ICD-10-CM | POA: Diagnosis not present

## 2020-03-27 DIAGNOSIS — Z471 Aftercare following joint replacement surgery: Secondary | ICD-10-CM | POA: Diagnosis not present

## 2020-03-27 DIAGNOSIS — Z6829 Body mass index (BMI) 29.0-29.9, adult: Secondary | ICD-10-CM | POA: Diagnosis not present

## 2020-03-27 DIAGNOSIS — Z96649 Presence of unspecified artificial hip joint: Secondary | ICD-10-CM | POA: Diagnosis not present

## 2020-03-27 DIAGNOSIS — D649 Anemia, unspecified: Secondary | ICD-10-CM | POA: Diagnosis not present

## 2020-03-27 DIAGNOSIS — N138 Other obstructive and reflux uropathy: Secondary | ICD-10-CM | POA: Diagnosis present

## 2020-03-27 DIAGNOSIS — R278 Other lack of coordination: Secondary | ICD-10-CM | POA: Diagnosis not present

## 2020-03-27 DIAGNOSIS — Z96659 Presence of unspecified artificial knee joint: Secondary | ICD-10-CM | POA: Diagnosis present

## 2020-03-27 DIAGNOSIS — Z85038 Personal history of other malignant neoplasm of large intestine: Secondary | ICD-10-CM | POA: Diagnosis not present

## 2020-03-27 DIAGNOSIS — G3183 Dementia with Lewy bodies: Secondary | ICD-10-CM | POA: Diagnosis present

## 2020-03-27 DIAGNOSIS — H9193 Unspecified hearing loss, bilateral: Secondary | ICD-10-CM | POA: Diagnosis not present

## 2020-03-27 DIAGNOSIS — M1611 Unilateral primary osteoarthritis, right hip: Secondary | ICD-10-CM | POA: Diagnosis not present

## 2020-03-27 DIAGNOSIS — I2510 Atherosclerotic heart disease of native coronary artery without angina pectoris: Secondary | ICD-10-CM | POA: Diagnosis not present

## 2020-03-27 DIAGNOSIS — N401 Enlarged prostate with lower urinary tract symptoms: Secondary | ICD-10-CM | POA: Diagnosis present

## 2020-03-27 DIAGNOSIS — G4700 Insomnia, unspecified: Secondary | ICD-10-CM | POA: Diagnosis not present

## 2020-03-27 DIAGNOSIS — M2550 Pain in unspecified joint: Secondary | ICD-10-CM | POA: Diagnosis not present

## 2020-03-27 DIAGNOSIS — Z20822 Contact with and (suspected) exposure to covid-19: Secondary | ICD-10-CM | POA: Diagnosis present

## 2020-03-27 DIAGNOSIS — E559 Vitamin D deficiency, unspecified: Secondary | ICD-10-CM | POA: Diagnosis not present

## 2020-03-27 DIAGNOSIS — Z96641 Presence of right artificial hip joint: Secondary | ICD-10-CM | POA: Diagnosis not present

## 2020-03-27 DIAGNOSIS — Z974 Presence of external hearing-aid: Secondary | ICD-10-CM | POA: Diagnosis not present

## 2020-03-27 DIAGNOSIS — R262 Difficulty in walking, not elsewhere classified: Secondary | ICD-10-CM | POA: Diagnosis not present

## 2020-03-27 DIAGNOSIS — G8929 Other chronic pain: Secondary | ICD-10-CM | POA: Diagnosis present

## 2020-03-27 DIAGNOSIS — Z79899 Other long term (current) drug therapy: Secondary | ICD-10-CM | POA: Diagnosis not present

## 2020-03-27 DIAGNOSIS — E669 Obesity, unspecified: Secondary | ICD-10-CM | POA: Diagnosis present

## 2020-03-27 DIAGNOSIS — I700 Atherosclerosis of aorta: Secondary | ICD-10-CM | POA: Diagnosis not present

## 2020-03-27 DIAGNOSIS — I4891 Unspecified atrial fibrillation: Secondary | ICD-10-CM | POA: Diagnosis not present

## 2020-03-27 DIAGNOSIS — Z9012 Acquired absence of left breast and nipple: Secondary | ICD-10-CM | POA: Diagnosis not present

## 2020-03-27 DIAGNOSIS — J188 Other pneumonia, unspecified organism: Secondary | ICD-10-CM | POA: Diagnosis not present

## 2020-03-27 DIAGNOSIS — I471 Supraventricular tachycardia: Secondary | ICD-10-CM | POA: Diagnosis not present

## 2020-03-27 DIAGNOSIS — F0280 Dementia in other diseases classified elsewhere without behavioral disturbance: Secondary | ICD-10-CM | POA: Diagnosis present

## 2020-03-27 DIAGNOSIS — Z743 Need for continuous supervision: Secondary | ICD-10-CM | POA: Diagnosis not present

## 2020-03-27 DIAGNOSIS — K219 Gastro-esophageal reflux disease without esophagitis: Secondary | ICD-10-CM | POA: Diagnosis not present

## 2020-03-27 DIAGNOSIS — I10 Essential (primary) hypertension: Secondary | ICD-10-CM | POA: Diagnosis not present

## 2020-03-27 DIAGNOSIS — Z7401 Bed confinement status: Secondary | ICD-10-CM | POA: Diagnosis not present

## 2020-03-27 DIAGNOSIS — I129 Hypertensive chronic kidney disease with stage 1 through stage 4 chronic kidney disease, or unspecified chronic kidney disease: Secondary | ICD-10-CM | POA: Diagnosis present

## 2020-03-27 DIAGNOSIS — Z9049 Acquired absence of other specified parts of digestive tract: Secondary | ICD-10-CM | POA: Diagnosis not present

## 2020-03-27 DIAGNOSIS — M545 Low back pain: Secondary | ICD-10-CM | POA: Diagnosis not present

## 2020-03-27 DIAGNOSIS — R404 Transient alteration of awareness: Secondary | ICD-10-CM | POA: Diagnosis not present

## 2020-03-27 DIAGNOSIS — Z419 Encounter for procedure for purposes other than remedying health state, unspecified: Secondary | ICD-10-CM | POA: Diagnosis not present

## 2020-03-27 LAB — SARS CORONAVIRUS 2 BY RT PCR (HOSPITAL ORDER, PERFORMED IN ~~LOC~~ HOSPITAL LAB): SARS Coronavirus 2: NEGATIVE

## 2020-03-27 MED ORDER — ASPIRIN 81 MG PO CHEW: 81.0000 mg | CHEWABLE_TABLET | Freq: Two times a day (BID) | ORAL | 0 refills | Status: AC

## 2020-03-27 MED ORDER — HYDROCODONE-ACETAMINOPHEN 7.5-325 MG PO TABS: 1.0000 | ORAL_TABLET | ORAL | 0 refills | Status: DC | PRN

## 2020-03-27 MED ORDER — METHOCARBAMOL 500 MG PO TABS: 500.0000 mg | ORAL_TABLET | Freq: Four times a day (QID) | ORAL | 0 refills | Status: DC | PRN

## 2020-03-27 NOTE — Progress Notes (Signed)
Patient transferred to room 1440. All belongings w/ patient. Family notified. Report called to [facility] RN

## 2020-03-27 NOTE — Progress Notes (Signed)
° ° ° °  Subjective: 3 Days Post-Op Procedure(s) (LRB): TOTAL HIP ARTHROPLASTY ANTERIOR APPROACH (Right)   Patient reports pain as mild, pain controlled.  No reported events throughout the night.  Dr. Alvan Dame discussed the procedure, findings and expectations moving forward.  Ready to be discharged home, if they do well with PT.  Otherwise will need SNF placement      Objective:   VITALS:   Vitals:   03/26/20 2159 03/27/20 0537  BP: 117/67 (!) 123/52  Pulse: 91 91  Resp: 17 19  Temp: 98.5 F (36.9 C) 98.8 F (37.1 C)  SpO2: 93% 95%    Dorsiflexion/Plantar flexion intact Incision: dressing C/D/I No cellulitis present Compartment soft  LABS Recent Labs    03/25/20 0317 03/26/20 0315  HGB 10.5* 9.8*  HCT 31.8* 29.0*  WBC 10.4 10.8*  PLT 212 198    Recent Labs    03/25/20 0317 03/26/20 0315  NA 135 136  K 4.0 4.0  BUN 29* 62*  CREATININE 1.44* 1.48*  GLUCOSE 156* 142*     Assessment/Plan: 3 Days Post-Op Procedure(s) (LRB): TOTAL HIP ARTHROPLASTY ANTERIOR APPROACH (Right)   Up with therapy Discharge disposition to be determined If safe from a PT stand point he should d/c home, possible today If unsafe an order was placed for SNF placement for additional assistance         Danae Orleans PA-C  [facility]   [place] [address], [place], [place] [address] Phone: [phone] [email or web address] Facebook   [employer]

## 2020-03-27 NOTE — Progress Notes (Signed)
Patient's wife called to inform staff that she has tested positive for the corona covid 19 virus. RN informed her that she is no longer able to visit patient in hospital. Patient will also need to d/c to a facility. Will update MD, Case management and hospital administration. Will cont to monitor and use respiratory precautions.

## 2020-03-27 NOTE — Plan of Care (Signed)
°  Problem: Activity: Goal: Risk for activity intolerance will decrease Outcome: Progressing   Problem: Education: Goal: Knowledge of the prescribed therapeutic regimen will improve Outcome: Progressing   Problem: Education: Goal: Knowledge of General Education information will improve Description: Including pain rating scale, medication(s)/side effects and non-pharmacologic comfort measures Outcome: Progressing   Problem: Health Behavior/Discharge Planning: Goal: Ability to manage health-related needs will improve Outcome: Progressing   Problem: Clinical Measurements: Goal: Ability to maintain clinical measurements within normal limits will improve Outcome: Progressing Goal: Will remain free from infection Outcome: Progressing Goal: Diagnostic test results will improve Outcome: Progressing Goal: Respiratory complications will improve Outcome: Progressing Goal: Cardiovascular complication will be avoided Outcome: Progressing   Problem: Activity: Goal: Risk for activity intolerance will decrease Outcome: Progressing   Problem: Nutrition: Goal: Adequate nutrition will be maintained Outcome: Progressing   Problem: Coping: Goal: Level of anxiety will decrease Outcome: Progressing   Problem: Elimination: Goal: Will not experience complications related to bowel motility Outcome: Progressing Goal: Will not experience complications related to urinary retention Outcome: Progressing   Problem: Pain Managment: Goal: General experience of comfort will improve Outcome: Progressing   Problem: Safety: Goal: Ability to remain free from injury will improve Outcome: Progressing   Problem: Skin Integrity: Goal: Risk for impaired skin integrity will decrease Outcome: Progressing

## 2020-03-27 NOTE — NC FL2 (Addendum)
[place] LEVEL OF CARE SCREENING TOOL     IDENTIFICATION  Patient Name: Michael [name] Birthdate: 13-May-1932 Sex: male Admission Date (Current Location): 03/24/2020  [facility] and [place] Number:  Herbalist and Address:         Provider Number: [id number]  Attending Physician Name and Address:  Paralee Cancel, MD  Relative Name and Phone Number:       Current Level of Care: Hospital Recommended Level of Care: [place] Prior Approval Number:    Date Approved/Denied:   PASRR Number: [id number]  Discharge Plan: SNF    Current Diagnoses: Patient Active Problem List   Diagnosis Date Noted  . Status post total hip replacement, right 03/27/2020  . Osteoarthritis of right hip 03/24/2020  . Status post right hip replacement 03/24/2020  . Skin tear of elbow without complication 16/96/7893  . Pre-op evaluation 02/19/2020  . Dementia with Lewy bodies (CODE) ([place]) 06/07/2019  . Auditory hallucinations 05/09/2019  . Medicare annual wellness visit, subsequent 05/08/2019  . Health maintenance examination 05/08/2019  . Advanced care planning/counseling discussion 05/08/2019  . Chronic insomnia 02/08/2019  . GERD (gastroesophageal reflux disease) 10/16/2018  . Atrial fibrillation ([place]) 07/27/2017  . Aortic atherosclerosis ([place]) 11/04/2016  . Coronary artery disease involving native coronary artery of native heart without angina pectoris 11/04/2016  . Hiatal hernia 01/06/2015  . Hematemesis without nausea 01/03/2015  . PSVT (paroxysmal supraventricular tachycardia) ([place]) 04/18/2014  . Dizziness 02/21/2014  . 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 Impotence of organic origin   . Essential hypertension 01/08/2010  . BPH with obstruction/lower urinary tract symptoms 01/07/2010  . Bilateral hearing loss 12/08/2008  . Gait disturbance 12/08/2008  . Hyperlipidemia LDL goal <100 07/23/2008  . CKD (chronic kidney disease) stage 3, GFR 30-59  ml/min (HCC) 07/23/2008  . History of colon cancer 07/22/2008    Orientation RESPIRATION BLADDER Height & Weight     Self, Time, Situation, Place  Normal Continent, External catheter Weight: 202 lb 6.4 oz (91.8 kg) Height:  5\' 10"  (177.8 cm)  BEHAVIORAL SYMPTOMS/MOOD NEUROLOGICAL BOWEL NUTRITION STATUS      Continent    AMBULATORY STATUS COMMUNICATION OF NEEDS Skin   Limited Assist Verbally Surgical wounds                       Personal Care Assistance Level of Assistance  Bathing, Dressing Bathing Assistance: Limited assistance   Dressing Assistance: Limited assistance     Functional Limitations Info             SPECIAL CARE FACTORS FREQUENCY  PT (By licensed PT), OT (By licensed OT)     PT Frequency: 5x/wk OT Frequency: 5x/wk            Contractures Contractures Info: Not present    Additional Factors Info  Code Status, Allergies Code Status Info: Full Allergies Info: NKDA           Current Medications (03/27/2020):  This is the current hospital active medication list Current Facility-Administered Medications  Medication Dose Route Frequency Provider Last Rate Last Admin  . 0.9 %  sodium chloride infusion   Intravenous Continuous Danae Orleans, PA-C 20 mL/hr at 03/25/20 0900 Rate Change at 03/25/20 0900  . acetaminophen (TYLENOL) tablet 325-650 mg  325-650 mg Oral Q6H PRN Danae Orleans, PA-C      . alum & mag hydroxide-simeth (MAALOX/MYLANTA) 200-200-20 MG/5ML suspension 15 mL  15 mL Oral Q4H PRN Danae Orleans,  PA-C   15 mL at 03/26/20 1925  . amoxicillin (AMOXIL) capsule 500 mg  500 mg Oral Q8H Babish, Matthew, PA-C   500 mg at 03/27/20 1332  . aspirin chewable tablet 81 mg  81 mg Oral BID Danae Orleans, PA-C   81 mg at 03/27/20 0957  . bisacodyl (DULCOLAX) suppository 10 mg  10 mg Rectal Daily PRN Babish, Matthew, PA-C      . clonazePAM Bobbye [place]) tablet 0.5-1 mg  0.5-1 mg Oral QHS Babish, Matthew, PA-C   0.5 mg at 03/26/20 2128  . diltiazem  (CARDIZEM CD) 24 hr capsule 120 mg  120 mg Oral Daily Danae Orleans, PA-C   120 mg at 03/27/20 0957  . diphenhydrAMINE (BENADRYL) 12.5 MG/5ML elixir 12.5-25 mg  12.5-25 mg Oral Q4H PRN Danae Orleans, PA-C      . docusate sodium (COLACE) capsule 100 mg  100 mg Oral BID Danae Orleans, PA-C   100 mg at 03/27/20 0957  . ferrous sulfate tablet 325 mg  325 mg Oral TID PC Danae Orleans, PA-C   325 mg at 03/27/20 1332  . flecainide (TAMBOCOR) tablet 100 mg  100 mg Oral BID Danae Orleans, PA-C   100 mg at 03/27/20 0957  . gabapentin (NEURONTIN) capsule 100 mg  100 mg Oral Daily PRN Danae Orleans, PA-C      . HYDROcodone-acetaminophen (NORCO) 7.5-325 MG per tablet 1-2 tablet  1-2 tablet Oral Q4H PRN Danae Orleans, PA-C   2 tablet at 03/26/20 1530  . HYDROcodone-acetaminophen (NORCO/VICODIN) 5-325 MG per tablet 1-2 tablet  1-2 tablet Oral Q4H PRN Danae Orleans, PA-C   2 tablet at 03/27/20 1205  . HYDROmorphone (DILAUDID) injection 0.5-1 mg  0.5-1 mg Intravenous Q2H PRN Danae Orleans, PA-C   0.5 mg at 03/24/20 1849  . magnesium citrate solution 1 Bottle  1 Bottle Oral Once PRN Babish, Matthew, PA-C      . menthol-cetylpyridinium (CEPACOL) lozenge 3 mg  1 lozenge Oral PRN Danae Orleans, PA-C       Or  . phenol (CHLORASEPTIC) mouth spray 1 spray  1 spray Mouth/Throat PRN Babish, [place]man [place], PA-C      . methocarbamol (ROBAXIN) tablet 500 mg  500 mg Oral Q6H PRN Danae Orleans, PA-C   500 mg at 03/27/20 0957   Or  . methocarbamol (ROBAXIN) 500 mg in dextrose 5 % 50 mL IVPB  500 mg Intravenous Q6H PRN Babish, Matthew, PA-C      . metoCLOPramide (REGLAN) tablet 5 mg  5 mg Oral Q8H PRN Danae Orleans, PA-C       Or  . metoCLOPramide (REGLAN) injection 5 mg  5 mg Intravenous Q8H PRN Babish, Matthew, PA-C      . ondansetron (ZOFRAN) tablet 4 mg  4 mg Oral Q6H PRN Danae Orleans, PA-C       Or  . ondansetron (ZOFRAN) injection 4 mg  4 mg Intravenous Q6H PRN Babish, Matthew, PA-C      .  pantoprazole (PROTONIX) EC tablet 40 mg  40 mg Oral Daily PRN Danae Orleans, PA-C   40 mg at 03/27/20 1204  . polyethylene glycol (MIRALAX / GLYCOLAX) packet 17 g  17 g Oral BID Danae Orleans, PA-C   17 g at 03/27/20 0957  . pravastatin (PRAVACHOL) tablet 20 mg  20 mg Oral q1800 Danae Orleans, PA-C   20 mg at 03/26/20 1711  . QUEtiapine (SEROQUEL) tablet 50 mg  50 mg Oral QHS Danae Orleans, PA-C   50 mg at 03/26/20 2128  Discharge Medications: Please see discharge summary for a list of discharge medications.  Relevant Imaging Results:  Relevant Lab Results:   Additional Information SS# [id number]  Lennart Pall, LCSW

## 2020-03-27 NOTE — TOC Progression Note (Signed)
Transition of Care [facility]) - Progression Note    Patient Details  Name: Michael [name] MRN: [id number] Date of Birth: 03-27-1932  Transition of Care [facility]) CM/SW Contact  Lennart Pall, LCSW Phone Number: 03/27/2020, 5:03 PM  Clinical Narrative:   Met with pt and have spoken with son, Lennette Bihari, today regarding pt's spouse' now positive for COVID. Concern that she cannot provide needed assistance for pt and they would prefer that we plan for short term SNF.  Have begun insurance authorization (ref # [id number]) with [employer].  FL2 sent to area facilities.  Will alert weekend TOC to status.    Expected Discharge Plan: [place]    Expected Discharge Plan and Services Expected Discharge Plan: [place]         Expected Discharge Date: 03/26/20               DME Arranged: N/A                     Social Determinants of Health (SDOH) Interventions    Readmission Risk Interventions No flowsheet data found.

## 2020-03-27 NOTE — Progress Notes (Signed)
Physical Therapy Treatment Patient Details Name: Michael [name] MRN: [id number] DOB: December 01, 1931 Today's Date: 03/27/2020    History of Present Illness Pt is an 86 year old male s/p R direct anterior THA with hx of pneumonia, HTN, SVT, colon cancer, L TKA    PT Comments    Pt assisted OOB however became dizzy with standing so assisted to recliner.  Pt performed LE exercises in recliner.  Pt may benefit from SNF upon d/c as he is progressing slowly and would require assist if d/c home.  Follow Up Recommendations  Follow surgeon's recommendation for DC plan and follow-up therapies SNF     Equipment Recommendations  None recommended by PT    Recommendations for Other Services       Precautions / Restrictions Precautions Precautions: Fall Precaution Comments: very HOH, hears better in right ear    Mobility  Bed Mobility Overal bed mobility: Needs Assistance Bed Mobility: Supine to Sit     Supine to sit: HOB elevated;Min assist     General bed mobility comments: assist for R LE  Transfers Overall transfer level: Needs assistance Equipment used: Rolling walker (2 wheeled) Transfers: Sit to/from [facility] Sit to Stand: Min assist Stand pivot transfers: Min assist       General transfer comment: multimodal cues for UE and LE positioning, assist to control descent; pt with dizziness again after 2 steps so pivoted to recliner; BP in flowsheet (not too low)  Ambulation/Gait                 Stairs             Wheelchair Mobility    Modified Rankin (Stroke Patients Only)       Balance                                            Cognition Arousal/Alertness: Awake/alert Behavior During Therapy: WFL for tasks assessed/performed Overall Cognitive Status: Within Functional Limits for tasks assessed                                 General Comments: pt reports word recognition difficulties       Exercises Total Joint Exercises Ankle Circles/Pumps: AROM;Both;10 reps Quad Sets: AROM;Both;10 reps Heel Slides: AAROM;Right;10 reps Hip ABduction/ADduction: AAROM;Right;10 reps    General Comments        Pertinent Vitals/Pain Pain Assessment: Faces Pain Score: 7  Pain Location: right hip Pain Descriptors / Indicators: Burning Pain Intervention(s): Monitored during session;Repositioned    Home Living                      Prior Function            PT Goals (current goals can now be found in the care plan section) Progress towards PT goals: Progressing toward goals    Frequency    7X/week      PT Plan Discharge plan needs to be updated    Co-evaluation              AM-PAC PT "6 Clicks" Mobility   Outcome Measure  Help needed turning from your back to your side while in a flat bed without using bedrails?: A Little Help needed moving from lying on your back to sitting on the side  of a flat bed without using bedrails?: A Lot Help needed moving to and from a bed to a chair (including a wheelchair)?: A Little Help needed standing up from a chair using your arms (e.g., wheelchair or bedside chair)?: A Little Help needed to walk in hospital room?: A Lot Help needed climbing 3-5 steps with a railing? : A Lot 6 Click Score: 15    End of Session Equipment Utilized During Treatment: Gait belt Activity Tolerance: Patient tolerated treatment well Patient left: with call bell/phone within reach;in chair;with chair alarm set   PT Visit Diagnosis: Other abnormalities of gait and mobility (R26.89)     Time: 8453-6468 PT Time Calculation (min) (ACUTE ONLY): 18 min  Charges:  $Therapeutic Exercise: 8-22 mins                     Arlyce Dice, DPT Acute Rehabilitation Services Pager: [phone] Office: [place] 03/27/2020, 1:29 PM

## 2020-03-28 NOTE — Progress Notes (Signed)
   03/28/20 1359  Assess: MEWS Score  Temp 98.4 F (36.9 C)  BP 134/69  Pulse Rate (!) 115  Resp 18  SpO2 93 %  O2 Device Room Air  Assess: MEWS Score  MEWS Temp 0  MEWS Systolic 0  MEWS Pulse 2  MEWS RR 0  MEWS LOC 0  MEWS Score 2  MEWS Score Color Yellow  Assess: if the MEWS score is Yellow or Red  Were vital signs taken at a resting state? Yes  Focused Assessment Documented focused assessment  Early Detection of Sepsis Score *See Row Information* Low  Treat  MEWS Interventions Administered prn meds/treatments;Other (Comment) (patient having heartburn, and pain. see MAR)  Escalate  MEWS: Escalate Yellow: discuss with charge nurse/RN and consider discussing with provider and RRT  Notify: Charge Nurse/RN  Name of Charge Nurse/RN Notified Megan  Date Charge Nurse/RN Notified 03/28/20  Time Charge Nurse/RN Notified 1403

## 2020-03-28 NOTE — Progress Notes (Signed)
Subjective: 4 Days Post-Op Procedure(s) (LRB): TOTAL HIP ARTHROPLASTY ANTERIOR APPROACH (Right) Patient reports pain as mild.   He is alert but not oriented at time of exam. Unable to get much info from his this am   Objective: Vital signs in last 24 hours: Temp:  [98 F (36.7 C)-98.4 F (36.9 C)] 98 F (36.7 C) (07/17 0518) Pulse Rate:  [80-110] 86 (07/17 0518) Resp:  [17-20] 20 (07/17 0518) BP: (103-146)/(44-60) 146/56 (07/17 0518) SpO2:  [88 %-95 %] 92 % (07/17 0518)  Intake/Output from previous day: 07/16 0701 - 07/17 0700 In: 544.8 [I.V.:544.8] Out: 1700 [Urine:1700] Intake/Output this shift: No intake/output data recorded.  Recent Labs    03/26/20 0315  HGB 9.8*   Recent Labs    03/26/20 0315  WBC 10.8*  RBC 2.94*  HCT 29.0*  PLT 198   Recent Labs    03/26/20 0315  NA 136  K 4.0  CL 100  CO2 25  BUN 62*  CREATININE 1.48*  GLUCOSE 142*  CALCIUM 8.6*   No results for input(s): LABPT, INR in the last 72 hours.  Neurologically intact Neurovascular intact Sensation intact distally Intact pulses distally Dorsiflexion/Plantar flexion intact Incision: dressing C/D/I No cellulitis present Compartment soft   Assessment/Plan: 4 Days Post-Op Procedure(s) (LRB): TOTAL HIP ARTHROPLASTY ANTERIOR APPROACH (Right) Up with therapy Discharge to SNF per therapy recommendation Waiting SNF placement Will continue PT while here    Derrick Ravel [phone] 03/28/2020, 7:35 AM

## 2020-03-28 NOTE — TOC Transition Note (Addendum)
Transition of Care [facility]) - CM/SW Discharge Note   Patient Details  Name: Michael [name] MRN: [id number] Date of Birth: 07/01/1932  [facility]) CM/SW Contact:  Ross Ludwig, LCSW Phone Number: 03/28/2020, 5:33 PM   Clinical Narrative:    Attempted to contact patient's son Lennette Bihari to discuss SNF placement options.  [facility] declined patient.  CSW continuing to find SNF beds for short term rehab.  CSW received phone call that patient has been approved by insurance, they just need to know which facility.  CSW attempted to [employer], CSW left a message waiting for call back.     Patient Goals and CMS Choice        Discharge Placement                       Discharge Plan and Services                DME Arranged: N/A                    Social Determinants of Health (SDOH) Interventions     Readmission Risk Interventions No flowsheet data found.

## 2020-03-28 NOTE — Plan of Care (Signed)
  Problem: Activity: Goal: Risk for activity intolerance will decrease Outcome: Progressing   Problem: Education: Goal: Knowledge of the prescribed therapeutic regimen will improve Outcome: Progressing   Problem: Education: Goal: Knowledge of General Education information will improve Description: Including pain rating scale, medication(s)/side effects and non-pharmacologic comfort measures Outcome: Progressing   Problem: Health Behavior/Discharge Planning: Goal: Ability to manage health-related needs will improve Outcome: Progressing   Problem: Clinical Measurements: Goal: Ability to maintain clinical measurements within normal limits will improve Outcome: Progressing Goal: Will remain free from infection Outcome: Progressing Goal: Diagnostic test results will improve Outcome: Progressing Goal: Respiratory complications will improve Outcome: Progressing Goal: Cardiovascular complication will be avoided Outcome: Progressing   Problem: Activity: Goal: Risk for activity intolerance will decrease Outcome: Progressing   Problem: Nutrition: Goal: Adequate nutrition will be maintained Outcome: Progressing   Problem: Coping: Goal: Level of anxiety will decrease Outcome: Progressing   Problem: Elimination: Goal: Will not experience complications related to bowel motility Outcome: Progressing Goal: Will not experience complications related to urinary retention Outcome: Progressing   Problem: Pain Managment: Goal: General experience of comfort will improve Outcome: Progressing   Problem: Safety: Goal: Ability to remain free from injury will improve Outcome: Progressing   Problem: Skin Integrity: Goal: Risk for impaired skin integrity will decrease Outcome: Progressing

## 2020-03-28 NOTE — Progress Notes (Addendum)
Physical Therapy Treatment Patient Details Name: Michael [name] MRN: [id number] DOB: 1931/09/23 Today's Date: 03/28/2020    History of Present Illness Pt is an 86 year old male s/p R direct anterior THA with hx of pneumonia, HTN, SVT, colon cancer, L TKA    PT Comments    Patient received in bed, asleep but easily woken. RN reports he seems to be having a harder time hearing today and recommended written communication- this was utilized throughout session. He reports no pain but that he is really not feeling well at all today. Needed higher levels of physical assistance for all mobility (as much as ModA) and session was significantly limited by HR today- HR 110 at rest in supine, 116-121 in sitting, and 120-162 in standing on multiple attempts with patient malaised and symptomatic with positional changes. Blood pressures no lower than 100/60s.  Did not attempt gait or further mobility due to medical status. Patient left in bed positioned to comfort and all needs met, bed alarm active and RN aware of patient status. Will continue to follow acutely.    Follow Up Recommendations  Follow surgeons recommendation for DC plan and follow-up therapies     Equipment Recommendations  None recommended by PT    Recommendations for Other Services       Precautions / Restrictions Precautions Precautions: Fall Precaution Comments: very HOH, hears better in right ear Restrictions Weight Bearing Restrictions: No    Mobility  Bed Mobility Overal bed mobility: Needs Assistance Bed Mobility: Supine to Sit;Sit to Supine     Supine to sit: HOB elevated;Mod assist Sit to supine: Min assist;HOB elevated   General bed mobility comments: for supine to sit needed ModA to pivot hips around to EOB, only MinA for back to bed for BLE management  Transfers Overall transfer level: Needs assistance Equipment used: Rolling walker (2 wheeled) Transfers: Sit to/from Stand Sit to Stand: Min assist;Mod  assist         General transfer comment: tactile and visual cues for positioning and sequencing, then needed MinA to stand on first attempt but ModA on second; HR between 120-162BPM in standing and patient malaised  Ambulation/Gait             General Gait Details: deferred- HR and general malaise   Stairs             Wheelchair Mobility    Modified Rankin (Stroke Patients Only)       Balance Overall balance assessment: Needs assistance Sitting-balance support: Feet supported;Bilateral upper extremity supported Sitting balance-Leahy Scale: Good     Standing balance support: Bilateral upper extremity supported;During functional activity Standing balance-Leahy Scale: Fair Standing balance comment: heavy reliance on BUE support                            Cognition Arousal/Alertness: Awake/alert Behavior During Therapy: WFL for tasks assessed/performed Overall Cognitive Status: Within Functional Limits for tasks assessed                                 General Comments: pt reports word recognition difficulties; did better on 7/17 with written communication      Exercises      General Comments General comments (skin integrity, edema, etc.): HR 110 supine, 116 sitting, between 120-162 standing; BP no lower than 100/60s today      Pertinent Vitals/Pain Pain Assessment: Faces  Faces Pain Scale: Hurts a little bit Pain Location: right hip Pain Descriptors / Indicators: Aching;Discomfort;Sore Pain Intervention(s): Monitored during session;Repositioned    Home Living                      Prior Function            PT Goals (current goals can now be found in the care plan section) Acute Rehab PT Goals PT Goal Formulation: With patient Time For Goal Achievement: 04/01/20 Potential to Achieve Goals: Good Progress towards PT goals: Not progressing toward goals - comment (limited by malaise and HR today)     Frequency    7X/week      PT Plan Current plan remains appropriate    Co-evaluation              AM-PAC PT "6 Clicks" Mobility   Outcome Measure  Help needed turning from your back to your side while in a flat bed without using bedrails?: A Little Help needed moving from lying on your back to sitting on the side of a flat bed without using bedrails?: A Lot Help needed moving to and from a bed to a chair (including a wheelchair)?: A Little Help needed standing up from a chair using your arms (e.g., wheelchair or bedside chair)?: A Lot Help needed to walk in hospital room?: A Lot Help needed climbing 3-5 steps with a railing? : A Lot 6 Click Score: 14    End of Session   Activity Tolerance: Treatment limited secondary to medical complications (Comment) (limited by HR) Patient left: in bed;with call bell/phone within reach;with bed alarm set Nurse Communication: Mobility status;Other (comment) (HR with activity, patient not feeling well at all) PT Visit Diagnosis: Other abnormalities of gait and mobility (R26.89)     Time: 1420-1500 PT Time Calculation (min) (ACUTE ONLY): 40 min  Charges:  $Therapeutic Activity: 38-52 mins                     Windell Norfolk, DPT, PN1   Supplemental Physical Therapist [place]    Pager [phone] Acute Rehab Office [phone]

## 2020-03-28 NOTE — Progress Notes (Signed)
A consult was placed to IV Therapy to restart the patient's iv;  Pt is eating and drinking per RN;  All meds at this time are PO;  Explained vein preservation, and the idea of not placing an iv for "just in case;"  Will cancel the iv consult and have the RN place it again if fluids are needed for the pt;  Michael Fanning RN IV Team

## 2020-03-29 MED ORDER — GUAIFENESIN-DM 100-10 MG/5ML PO SYRP
5.0000 mL | ORAL_SOLUTION | ORAL | Status: DC | PRN
  Administered 2020-03-29 – 2020-03-30 (×3): 5 mL via ORAL
  Filled 2020-03-29 (×3): qty 10

## 2020-03-29 MED ORDER — KETOROLAC TROMETHAMINE 15 MG/ML IJ SOLN
15.0000 mg | Freq: Once | INTRAMUSCULAR | Status: AC
  Administered 2020-03-29: 15 mg via INTRAVENOUS
  Filled 2020-03-29: qty 1

## 2020-03-29 NOTE — Plan of Care (Signed)
  Problem: Activity: Goal: Risk for activity intolerance will decrease Outcome: Progressing   Problem: Education: Goal: Knowledge of the prescribed therapeutic regimen will improve Outcome: Progressing   Problem: Education: Goal: Knowledge of General Education information will improve Description: Including pain rating scale, medication(s)/side effects and non-pharmacologic comfort measures Outcome: Progressing   Problem: Health Behavior/Discharge Planning: Goal: Ability to manage health-related needs will improve Outcome: Progressing   Problem: Clinical Measurements: Goal: Ability to maintain clinical measurements within normal limits will improve Outcome: Progressing Goal: Will remain free from infection Outcome: Progressing Goal: Diagnostic test results will improve Outcome: Progressing Goal: Respiratory complications will improve Outcome: Progressing Goal: Cardiovascular complication will be avoided Outcome: Progressing   Problem: Activity: Goal: Risk for activity intolerance will decrease Outcome: Progressing   Problem: Nutrition: Goal: Adequate nutrition will be maintained Outcome: Progressing   Problem: Coping: Goal: Level of anxiety will decrease Outcome: Progressing   Problem: Elimination: Goal: Will not experience complications related to bowel motility Outcome: Progressing Goal: Will not experience complications related to urinary retention Outcome: Progressing   Problem: Pain Managment: Goal: General experience of comfort will improve Outcome: Progressing   Problem: Safety: Goal: Ability to remain free from injury will improve Outcome: Progressing   Problem: Skin Integrity: Goal: Risk for impaired skin integrity will decrease Outcome: Progressing

## 2020-03-29 NOTE — Progress Notes (Signed)
Subjective: 5 Days Post-Op Procedure(s) (LRB): TOTAL HIP ARTHROPLASTY ANTERIOR APPROACH (Right)  Patient resting comfortably in bed this morning.  Nurse at bedside.  Nurse reports that patient is [facility] and refuses to put in his hearing aids.  Nurse reports that vitals are stable and WNL. Unable to communicate appropriately with patient.  He did smile and wave as I left the room.  Objective:   VITALS:  Temp:  [97.8 F (36.6 C)-99.2 F (37.3 C)] 98.9 F (37.2 C) (07/18 0618) Pulse Rate:  [82-117] 82 (07/18 0854) Resp:  [18-20] 20 (07/18 0618) BP: (109-134)/(53-69) 119/64 (07/18 0618) SpO2:  [77 %-96 %] 93 % (07/18 0854)  General: WDWN patient in NAD. Psych:  Unable to appropriately assess. Neuro:  Unable to appropriately assess A&O due to hearing loss, Moving all extremities, sensation intact to light touch HEENT:  EOMs intact Chest:  Even non-labored respirations Skin:  Aquacel dressing C/D/I, no rashes or lesions Extremities: warm/dry, no edema, erythema or echymosis.  No lymphadenopathy. Pulses: Popliteus 2+ MSK:  ROM: TKE, MMT: able to perform quad set, (-) Homan's    LABS No results for input(s): HGB, WBC, PLT in the last 72 hours. No results for input(s): NA, K, CL, CO2, BUN, CREATININE, GLUCOSE in the last 72 hours. No results for input(s): LABPT, INR in the last 72 hours.   Assessment/Plan: 5 Days Post-Op Procedure(s) (LRB): TOTAL HIP ARTHROPLASTY ANTERIOR APPROACH (Right)  Patient seen in rounds for Dr. Leda Quail LE Up with therapy Disp: SNF per PT recommendation Plan for outpatient post-op visit with Dr. Lorinda Creed PA-C [facility] Office:  [phone]

## 2020-03-30 NOTE — Care Management Important Message (Signed)
Important Message  Patient Details IM Letter given to Gabriel Earing RN Case Manager to present to the Patient Name: Michael [name] MRN: [id number] Date of Birth: 03-Jun-1932   Medicare Important Message Given:  Yes     Kerin Salen 03/30/2020, 11:16 AM

## 2020-03-30 NOTE — Progress Notes (Signed)
Physical Therapy Treatment Patient Details Name: Michael [name] MRN: [id number] DOB: September 23, 1931 Today's Date: 03/30/2020    History of Present Illness Pt is an 86 year old male s/p R direct anterior THA with hx of pneumonia, HTN, SVT, colon cancer, L TKA    PT Comments    Pt slow to mobilize however able to ambulate within room (used bathroom).  Pt left in comfortable in recliner and aware to use red call button.  Continue to recommend SNF upon d/c.   Follow Up Recommendations  Follow surgeons recommendation for DC plan and follow-up therapies;SNF     Equipment Recommendations  None recommended by PT    Recommendations for Other Services       Precautions / Restrictions Precautions Precautions: Fall Precaution Comments: very HOH, hears better in right ear Restrictions Weight Bearing Restrictions: No    Mobility  Bed Mobility Overal bed mobility: Needs Assistance Bed Mobility: Supine to Sit     Supine to sit: Mod assist;HOB elevated     General bed mobility comments: visual cues for self assist; assist for R LE and trunk upright  Transfers Overall transfer level: Needs assistance Equipment used: Rolling walker (2 wheeled) Transfers: Sit to/from Stand Sit to Stand: Min assist;Mod assist         General transfer comment: tactile and visual cues for positioning and sequencing, then needed MinA to stand from bed but ModA from lower toilet; pt with coughing and increased work of breathing however SPO2 98% on room air upon sitting in recliner  Ambulation/Gait Ambulation/Gait assistance: Min Web designer (Feet): 9 Feet (x2) Assistive device: Rolling walker (2 wheeled) Gait Pattern/deviations: Step-to pattern;Decreased stance time - right;Antalgic;Trunk flexed     General Gait Details: ambulated to/from bathroom (pt had BM); pt very fatigued with this activity however agreeable to remain up in recliner end of session   Stairs             Wheelchair  Mobility    Modified Rankin (Stroke Patients Only)       Balance                                            Cognition Arousal/Alertness: Awake/alert Behavior During Therapy: WFL for tasks assessed/performed Overall Cognitive Status: Within Functional Limits for tasks assessed                                 General Comments: pt reports word recognition difficulties      Exercises      General Comments        Pertinent Vitals/Pain Pain Assessment: Faces Faces Pain Scale: Hurts even more Pain Location: right hip Pain Descriptors / Indicators: Aching;Discomfort;Sore Pain Intervention(s): Repositioned;Monitored during session    Home Living                      Prior Function            PT Goals (current goals can now be found in the care plan section) Progress towards PT goals: Progressing toward goals    Frequency    7X/week      PT Plan Current plan remains appropriate    Co-evaluation              AM-PAC PT "6 Clicks" Mobility  Outcome Measure  Help needed turning from your back to your side while in a flat bed without using bedrails?: A Little Help needed moving from lying on your back to sitting on the side of a flat bed without using bedrails?: A Lot Help needed moving to and from a bed to a chair (including a wheelchair)?: A Lot Help needed standing up from a chair using your arms (e.g., wheelchair or bedside chair)?: A Lot Help needed to walk in hospital room?: A Little Help needed climbing 3-5 steps with a railing? : A Lot 6 Click Score: 14    End of Session Equipment Utilized During Treatment: Gait belt Activity Tolerance: Patient tolerated treatment well Patient left: in chair;with chair alarm set;with call bell/phone within reach Nurse Communication: Mobility status PT Visit Diagnosis: Other abnormalities of gait and mobility (R26.89)     Time: 9449-6759 PT Time Calculation (min)  (ACUTE ONLY): 40 min  Charges:  $Gait Training: 8-22 mins $Therapeutic Activity: 8-22 mins                     Jannette Spanner PT, DPT Acute Rehabilitation Services Pager: [phone] Office: [place] 03/30/2020, 3:26 PM

## 2020-03-30 NOTE — Progress Notes (Signed)
Patients spouse called from patient room at this time. Update given to spouse about patient progression and plan of care. Patient able to speak with spouse independently.

## 2020-03-30 NOTE — TOC Progression Note (Signed)
Transition of Care [facility]) - Progression Note    Patient Details  Name: Michael [name] MRN: [id number] Date of Birth: 25-Mar-1932  [facility]) CM/SW Contact  Purcell Mouton, RN Phone Number: 03/30/2020, 4:09 PM  Clinical Narrative:     No SNF will take pt related to his wife having [place]. Explained this to pt's wife and that the next option is to take pt home with [facility]., Wife understands.   Expected Discharge Plan: [place]    Expected Discharge Plan and Services Expected Discharge Plan: [place]         Expected Discharge Date: 03/30/20               DME Arranged: N/A                     Social Determinants of Health (SDOH) Interventions    Readmission Risk Interventions No flowsheet data found.

## 2020-03-30 NOTE — Progress Notes (Signed)
     Subjective: 6 Days Post-Op Procedure(s) (LRB): TOTAL HIP ARTHROPLASTY ANTERIOR APPROACH (Right)   Patient reports pain as mild, pain controlled. No reported events throughout the night. Latest Covid still negative and asymptomatic.  Plan on SNF when ready.     Objective:   VITALS:    03/30/20 0920  BP: 86/53  Pulse: 107  Resp: 20  Temp: 98.3 F (36.8 C)  SpO2: 95    Dorsiflexion/Plantar flexion intact Incision: dressing C/D/I No cellulitis present Compartment soft  Swelling about the right thigh  LABS No results for input(s): HGB, HCT, WBC, PLT in the last 72 hours.  No results for input(s): NA, K, BUN, CREATININE, GLUCOSE in the last 72 hours.   Assessment/Plan: 6 Days Post-Op Procedure(s) (LRB): TOTAL HIP ARTHROPLASTY ANTERIOR APPROACH (Right)   Up with therapy Discharge to SNF  Follow up in 2 weeks at [facility] Follow up with OLIN,Garold Sheeler D in 2 weeks.  Contact information:  [facility] [address], [address] [place] [phone]             Danae Orleans PA-C  [facility]  [place] [address], [place], [place], [place] [address] Phone: [phone] [email or web address] Facebook  [employer]

## 2020-03-30 NOTE — TOC Progression Note (Signed)
Transition of Care [facility]) - Progression Note    Patient Details  Name: Michael [name] MRN: [id number] Date of Birth: [birth year]  [facility] CM/SW Contact  Purcell Mouton, RN Phone Number: 03/30/2020, 12:26 PM  Clinical Narrative:    [facility]. A call was made to several SNF's who have all declined pt. Also related to pt's wife being positive for COVID on 03/27/2020. Waiting for one SNF to call back, who was willing to take pt knowing that his wife was COVID+.   Expected Discharge Plan: [place]    Expected Discharge Plan and Services Expected Discharge Plan: [place]         Expected Discharge Date: 03/26/20               DME Arranged: N/A                     Social Determinants of Health (SDOH) Interventions    Readmission Risk Interventions No flowsheet data found.

## 2020-03-30 NOTE — Discharge Summary (Addendum)
Patient ID: Michael [name] MRN: [id number] DOB/AGE: [birth year] 86 y.o.  Admit date: 03/24/2020 Discharge date: 04/03/2020    Admission Diagnoses:  Principal Problem:   Osteoarthritis of right hip Active Problems:   Status post total hip replacement, right   Discharge Diagnoses:  Same  Past Medical History:  Diagnosis Date  . Arthritis   . Aspiration pneumonia ([place])   . BPH (benign prostatic hypertrophy)   . Cellulitis of face 12/21/2019  . Chronic back pain   . CKD (chronic kidney disease), stage III    Stage III kidney disease  . Colon cancer [facility]) 1988   Surgery alone, no history of chemo  . Diverticulosis   . Essential hypertension   . GERD (gastroesophageal reflux disease)   . H/O calcium pyrophosphate deposition disease (CPPD)   . Hiatal hernia 01/06/2015  . HLD (hyperlipidemia)   . HOH (hard of hearing)    bilateral hearing aids  . Impotence of organic origin   . Peritonsillar abscess 05/16/2019  . Peritonsillar abscess determined by examination 05/15/2019   S/p abx, I&D by ENT 05/2019  . PSVT (paroxysmal supraventricular tachycardia) ([place])    a. 04/2014 Event Monitor: Freq bouts of SVT with rates to 180-->seen by EP with recommendation for RFCA, pt deferred;  b. 05/2014 Echo: EF nl, mild conc LVH, no rwma, Gr1 DD, mildMR/TR.  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 Rotator cuff rupture    Right; s/p repair  . SVT (supraventricular tachycardia) ([place])   . Unspecified hearing loss   . Ventral hernia 1988    Surgeries: Procedure(s):  RIGHT TOTAL HIP ARTHROPLASTY ANTERIOR APPROACH on 03/24/2020   Consultants: N/A  Discharged Condition: Improved  Hospital Course: Michael [name] is an 86 y.o. male who was admitted 03/24/2020 for operative treatment ofOsteoarthritis of right hip. Patient has severe unremitting pain that affects sleep, daily activities, and work/hobbies. After pre-op clearance the patient was taken to the operating room on 03/24/2020 and underwent   Procedure(s):  RIGHT TOTAL HIP ARTHROPLASTY ANTERIOR APPROACH.    Patient was given perioperative antibiotics:  Anti-infectives (From admission, onward)   Start     Dose/Rate Route Frequency Ordered Stop   03/25/20 1530  amoxicillin (AMOXIL) capsule 500 mg     Discontinue     500 mg Oral Every 8 hours 03/25/20 1451     03/24/20 2000  ceFAZolin (ANCEF) IVPB 2g/100 mL premix        2 g 200 mL/hr over 30 Minutes Intravenous Every 6 hours 03/24/20 1649 03/25/20 0144   03/24/20 1016  ceFAZolin (ANCEF) 2-4 GM/100ML-% IVPB       Note to Pharmacy: Marchia Meiers   : cabinet override      03/24/20 1016 03/24/20 1607   03/24/20 1015  ceFAZolin (ANCEF) IVPB 2g/100 mL premix  Status:  Discontinued        2 g 200 mL/hr over 30 Minutes Intravenous On call to O.R. 03/24/20 1002 03/24/20 1637       Patient was given sequential compression devices, early ambulation, and chemoprophylaxis to prevent DVT.  Patient benefited maximally from hospital stay and there were no complications.    Recent vital signs:  Patient Vitals for the past 24 hrs:  BP Temp Temp src Pulse Resp SpO2  04/03/20 1257 (!) 146/74 98.7 F (37.1 C) Oral 72 18 98 %  04/03/20 0551 (!) 139/70 98.2 F (36.8 C) Oral 71 18 93 %  04/02/20 2021 (!) 134/62 99.5 F (37.5 C) Oral  76 20 95 %     Recent laboratory studies: No results for input(s): WBC, HGB, HCT, PLT, NA, K, CL, CO2, BUN, CREATININE, GLUCOSE, INR, CALCIUM in the last 72 hours.  Invalid input(s): PT, 2   Discharge Medications:   Allergies as of 04/03/2020   No Known Allergies     Medication List    STOP taking these medications   ibuprofen 200 MG tablet Commonly known as: ADVIL   oxyCODONE-acetaminophen 5-325 MG tablet Commonly known as: PERCOCET/ROXICET     TAKE these medications   aspirin 81 MG chewable tablet Commonly known as: Aspirin Childrens Chew 1 tablet (81 mg total) by mouth 2 (two) times daily. Take for 4 weeks, then resume regular dose.    B-12 PO Take 500 mcg by mouth daily.   CALCIUM+D3 PO Take 1 tablet by mouth daily.   CENTRUM SILVER ADULT 50+ PO Take 1 tablet by mouth daily. Centrum Silver   clonazePAM 0.5 MG tablet Commonly known as: KLONOPIN One or two pills at bedtime What changed:   how much to take  how to take this  when to take this  additional instructions   diltiazem 120 MG 24 hr capsule Commonly known as: Cardizem CD Take 1 capsule (120 mg total) by mouth daily.   docusate sodium 100 MG capsule Commonly known as: Colace Take 1 capsule (100 mg total) by mouth 2 (two) times daily.   ferrous sulfate 325 (65 FE) MG tablet Commonly known as: FerrouSul Take 1 tablet (325 mg total) by mouth 3 (three) times daily with meals for 14 days.   flecainide 100 MG tablet Commonly known as: TAMBOCOR TAKE 1 TABLET BY MOUTH TWICE A DAY What changed: when to take this   gabapentin 100 MG capsule Commonly known as: NEURONTIN Take 100 mg by mouth daily as needed (pain).   HYDROcodone-acetaminophen 5-325 MG tablet Commonly known as: Norco Take 1-2 tablets by mouth every 6 (six) hours as needed for moderate pain or severe pain.   lovastatin 20 MG tablet Commonly known as: MEVACOR TAKE 1 TABLET BY MOUTH ONCE DAILY   Melatonin 10 MG Caps Take 1 capsule by mouth at bedtime. What changed:   how much to take  when to take this  reasons to take this   methocarbamol 500 MG tablet Commonly known as: Robaxin Take 1 tablet (500 mg total) by mouth every 6 (six) hours as needed for muscle spasms.   omeprazole 20 MG capsule Commonly known as: PRILOSEC TAKE 1 CAPSULE BY MOUTH EVERY DAY AS NEEDED. What changed:   how much to take  how to take this  when to take this  reasons to take this  additional instructions   polyethylene glycol 17 g packet Commonly known as: MIRALAX / GLYCOLAX Take 17 g by mouth 2 (two) times daily.   QUEtiapine 100 MG tablet Commonly known as: SEROQUEL Take 50 mg  by mouth at bedtime.   triamcinolone cream 0.1 % Commonly known as: KENALOG Apply 1 application topically daily as needed (rash).   Vitamin D3 125 MCG (5000 UT) Caps Take 5,000 Units by mouth daily.   zinc gluconate 50 MG tablet Take 50 mg by mouth daily.   ZINC PO Take 50 mg by mouth daily.            Discharge Care Instructions  (From admission, onward)         Start     Ordered   03/25/20 0000  Change dressing  Comments: Maintain surgical dressing until follow up in the clinic. If the edges start to pull up, may reinforce with tape. If the dressing is no longer working, may remove and cover with gauze and tape, but must keep the area dry and clean.  Call with any questions or concerns.   03/25/20 0849          Diagnostic Studies: DG Pelvis Portable  Result Date: 03/24/2020 CLINICAL DATA:  Status post right hip replacement. EXAM: PORTABLE PELVIS 1-2 VIEWS COMPARISON:  None. FINDINGS: Status post right hip arthroplasty. No fracture or dislocation is noted. The left hip is unremarkable. IMPRESSION: Status post right hip arthroplasty. Electronically Signed   By: Marijo Conception M.D.   On: 03/24/2020 14:33   DG C-Arm 1-60 Min-No Report  Result Date: 03/24/2020 Fluoroscopy was utilized by the requesting physician.  No radiographic interpretation.   DG HIP OPERATIVE UNILAT W OR W/O PELVIS RIGHT  Result Date: 03/24/2020 CLINICAL DATA:  Total hip replacement EXAM: OPERATIVE RIGHT HIP   1 VIEW TECHNIQUE: Fluoroscopic spot image(s) were submitted for interpretation post-operatively. FLUOROSCOPY TIME:  0 minutes 12 seconds; 5 acquired images COMPARISON:  None. FINDINGS: Frontal images show prosthetic components placed for total hip replacement. Prosthetic components appear well seated on submitted images. No acute fracture or dislocation evident. Moderate narrowing left hip joint. IMPRESSION: Images show placement of total hip replacement prostheses on the right with  prosthetic components appearing well-seated on frontal view. No fracture or dislocation evident. Electronically Signed   By: Lowella Grip III M.D.   On: 03/24/2020 13:50    Disposition:    Skilled nursing facility   Discharge Instructions    Call MD / Call 911   Complete by: As directed    If you experience chest pain or shortness of breath, CALL 911 and be transported to the hospital emergency room.  If you develope a fever above 101 F, pus (white drainage) or increased drainage or redness at the wound, or calf pain, call your surgeon's office.   Change dressing   Complete by: As directed    Maintain surgical dressing until follow up in the clinic. If the edges start to pull up, may reinforce with tape. If the dressing is no longer working, may remove and cover with gauze and tape, but must keep the area dry and clean.  Call with any questions or concerns.   Constipation Prevention   Complete by: As directed    Drink plenty of fluids.  Prune juice may be helpful.  You may use a stool softener, such as Colace (over the counter) 100 mg twice a day.  Use MiraLax (over the counter) for constipation as needed.   Diet - low sodium heart healthy   Complete by: As directed    Discharge instructions   Complete by: As directed    Maintain surgical dressing until follow up in the clinic. If the edges start to pull up, may reinforce with tape. If the dressing is no longer working, may remove and cover with gauze and tape, but must keep the area dry and clean.  Follow up in 2 weeks at [facility]. Call with any questions or concerns.   Increase activity slowly as tolerated   Complete by: As directed    Weight bearing as tolerated with assist device (walker, cane, etc) as directed, use it as long as suggested by your surgeon or therapist, typically at least 4-6 weeks.   TED hose   Complete by: As  directed    Use stockings (TED hose) for 2 weeks on both leg(s).  You may remove them at night for  sleeping.       Contact information for follow-up providers    Paralee Cancel, MD. Go on 04/08/2020.   Specialty: Orthopedic Surgery Why: You are scheduled for first post op appointment on Wednesday Juy 28th at 3:15pm. Contact information: [address] [place] [address] [phone]            Contact information for after-discharge care    [facility] .   Service: Skilled Nursing Contact information: [place] [phone]                   Signed: Lucille Passy [facility] 04/03/2020, 1:52 PM

## 2020-03-30 NOTE — Plan of Care (Signed)
  Problem: Activity: Goal: Risk for activity intolerance will decrease Outcome: Progressing   Problem: Education: Goal: Knowledge of the prescribed therapeutic regimen will improve Outcome: Progressing   Problem: Education: Goal: Knowledge of General Education information will improve Description: Including pain rating scale, medication(s)/side effects and non-pharmacologic comfort measures Outcome: Progressing   Problem: Health Behavior/Discharge Planning: Goal: Ability to manage health-related needs will improve Outcome: Progressing   Problem: Clinical Measurements: Goal: Ability to maintain clinical measurements within normal limits will improve Outcome: Progressing Goal: Will remain free from infection Outcome: Progressing Goal: Diagnostic test results will improve Outcome: Progressing Goal: Respiratory complications will improve Outcome: Progressing Goal: Cardiovascular complication will be avoided Outcome: Progressing   Problem: Activity: Goal: Risk for activity intolerance will decrease Outcome: Progressing   Problem: Nutrition: Goal: Adequate nutrition will be maintained Outcome: Progressing   Problem: Coping: Goal: Level of anxiety will decrease Outcome: Progressing   Problem: Elimination: Goal: Will not experience complications related to bowel motility Outcome: Progressing Goal: Will not experience complications related to urinary retention Outcome: Progressing   Problem: Pain Managment: Goal: General experience of comfort will improve Outcome: Progressing   Problem: Safety: Goal: Ability to remain free from injury will improve Outcome: Progressing   Problem: Skin Integrity: Goal: Risk for impaired skin integrity will decrease Outcome: Progressing

## 2020-03-30 NOTE — TOC Progression Note (Signed)
Transition of Care [facility]) - Progression Note    Patient Details  Name: Michael [name] MRN: [id number] Date of Birth: May 23, 1932  [facility]) CM/SW Contact  Purcell Mouton, RN Phone Number: 03/30/2020, 10:29 AM  Clinical Narrative:     Pt have bed offer at [facility] spoke with admission coordinator Seth Bake. Insurance approved 7/19-7/22. Report to be faxed to Starr Sinclair, RN [phone], Ref [id number]. Waiting for MD to sign FL2 and discharge orders.   Expected Discharge Plan: [place]    Expected Discharge Plan and Services Expected Discharge Plan: [place]         Expected Discharge Date: 03/26/20               DME Arranged: N/A                     Social Determinants of Health (SDOH) Interventions    Readmission Risk Interventions No flowsheet data found.

## 2020-03-31 NOTE — Progress Notes (Signed)
Wife Festus Holts updated on patients condition.

## 2020-03-31 NOTE — Progress Notes (Signed)
Report received from Almon Hercules.  No change in assessment. Pt denies any needs at this time. Will continue plan of care. Fatou Dunnigan, Laurel Dimmer, RN

## 2020-03-31 NOTE — Progress Notes (Signed)
Subjective: 7 Days Post-Op Procedure(s) (LRB): TOTAL HIP ARTHROPLASTY ANTERIOR APPROACH (Right) Patient reports pain as mild.   Patient seen in rounds for Dr. Alvan Dame. Patient is well, and has had no acute complaints or problems. Patient is HOH, but is wearing hearing aids this morning on exam. He reports he is doing well.  We will continue therapy today.   Objective: Vital signs in last 24 hours: Temp:  [97.7 F (36.5 C)-100.3 F (37.9 C)] 99.5 F (37.5 C) (07/20 1310) Pulse Rate:  [74-113] 80 (07/20 1310) Resp:  [17] 17 (07/20 0503) BP: (92-140)/(53-69) 140/56 (07/20 1310) SpO2:  [92 %-100 %] 93 % (07/20 1310)  Intake/Output from previous day:  Intake/Output Summary (Last 24 hours) at 03/31/2020 1407 Last data filed at 03/31/2020 1112 Gross per 24 hour  Intake 360 ml  Output 1900 ml  Net -1540 ml     Intake/Output this shift: Total I/O In: 360 [P.O.:360] Out: 250 [Urine:250]  Labs: No results for input(s): HGB in the last 72 hours. No results for input(s): WBC, RBC, HCT, PLT in the last 72 hours. No results for input(s): NA, K, CL, CO2, BUN, CREATININE, GLUCOSE, CALCIUM in the last 72 hours. No results for input(s): LABPT, INR in the last 72 hours.  Exam: General - Patient is Alert and Oriented Extremity - Neurologically intact Sensation intact distally Intact pulses distally Dorsiflexion/Plantar flexion intact Dressing - dressing C/D/I Motor Function - intact, moving foot and toes well on exam.   Past Medical History:  Diagnosis Date  . Arthritis   . Aspiration pneumonia ([place])   . BPH (benign prostatic hypertrophy)   . Cellulitis of face 12/21/2019  . Chronic back pain   . CKD (chronic kidney disease), stage III    Stage III kidney disease  . Colon cancer [facility]) 1988   Surgery alone, no history of chemo  . Diverticulosis   . Essential hypertension   . GERD (gastroesophageal reflux disease)   . H/O calcium pyrophosphate deposition disease (CPPD)   .  Hiatal hernia 01/06/2015  . HLD (hyperlipidemia)   . HOH (hard of hearing)    bilateral hearing aids  . Impotence of organic origin   . Peritonsillar abscess 05/16/2019  . Peritonsillar abscess determined by examination 05/15/2019   S/p abx, I&D by ENT 05/2019  . PSVT (paroxysmal supraventricular tachycardia) ([place])    a. 04/2014 Event Monitor: Freq bouts of SVT with rates to 180-->seen by EP with recommendation for RFCA, pt deferred;  b. 05/2014 Echo: EF nl, mild conc LVH, no rwma, Gr1 DD, mildMR/TR.  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 Rotator cuff rupture    Right; s/p repair  . SVT (supraventricular tachycardia) ([place])   . Unspecified hearing loss   . Ventral hernia 1988    Assessment/Plan: 7 Days Post-Op Procedure(s) (LRB): TOTAL HIP ARTHROPLASTY ANTERIOR APPROACH (Right) Principal Problem:   Osteoarthritis of right hip Active Problems:   Status post right hip replacement   Status post total hip replacement, right  Estimated body mass index is 29.04 kg/m as calculated from the following:   Height as of this encounter: 5\' 10"  (1.778 m).   Weight as of this encounter: 91.8 kg. Advance diet Up with therapy  DVT Prophylaxis - Aspirin Weight bearing as tolerated.  There have been many discussions between patient family and care team regarding discharge disposition. Currently, we are attempting to obtain SNF placement. Possible discharge on Friday. Continue working with therapy.   Griffith Citron, PA-C Orthopedic  Surgery [phone] 03/31/2020, 2:07 PM

## 2020-03-31 NOTE — TOC Progression Note (Addendum)
Transition of Care [facility]) - Progression Note    Patient Details  Name: Michael [name] MRN: [id number] Date of Birth: 1932-08-05  Transition [facility] Care [facility]) CM/SW Contact  Purcell Mouton, RN Phone Number: 03/31/2020, 9:49 AM  Clinical Narrative:    Spoke with wife Michael [name] in length, several times 7/19 concerning placement and home health. Also asked Michael [name] if she wanted CM to talk to her or her son Michael [name], [place]. Michael [name] stated talk to her. Every facility that was fax, declined to take the pt related to wife being exposed to COVID positive people and Lewy Bodies Dementia. Explained to wife this information, that facilities that declined the pt was called to ask for a second review. Wife stated that she did not have COVID when this CM first spoke with her on 7/19. After speaking with Admission Coordinator at [facility] it was discoveried that wife was positive for COVID. During back and forth calls with wife, facilities, no one would take pt and [facility] declined pt for [facility] related to the level of care. [facility] is willing to supply HHPT. 7/20 A call today to pt's wife Michael [name] to ask again who she wanted CM to communicate with her or son Michael [name]. Michael [name] states please talk to Michael [name] he is worried about me and his father. Michael [name] is POA for [place]. Explained that by law I would talk to her first, however since she asked, Michael [name] would be called.  Michael [name] stated that her son Merry Proud and his daughter came up to visit and was COVID positive. Son Merry Proud and daughter then went to hotel to stay/wife.  Son Michael [name] lives in [place].  A call to Michael [name] revealed that he is concerned and asked why MD did surgery on pt knowing that he was exposed to [place]. Michael [name] explained that his mother/Ella called the hospital to inform them that pt had been exposed to [place]. Michael [name] asked for hospital policy for admitting a COVID pt to have surgery. Instructed Michael [name] to call [place] main number and ask for risk management for policies. Explained to Michael [name]  that SNF will put pt in isolation for 14 days when leaving hospital. Michael [name] asked if this was SNF policy or hospital. Explained that hospital and facilities are following CDC guidelines. Pt's isolation in a SNF does not count the time in hospital isolation. SNF do not want an out break of COVID and are taking every precaution that does not happen again. CM will continue to work on this case and inform Michael [name] later today. May have a facility to take pt on Friday if he is COVID negative. Praying and hoping facility will take pt on Friday. MD is aware.    Expected Discharge Plan: [place]    Expected Discharge Plan and Services Expected Discharge Plan: [place]         Expected Discharge Date: 03/30/20               DME Arranged: N/A                     Social Determinants of Health (SDOH) Interventions    Readmission Risk Interventions No flowsheet data found.

## 2020-03-31 NOTE — TOC Progression Note (Signed)
Transition of Care [facility]) - Progression Note    Patient Details  Name: Michael [name] MRN: [id number] Date of Birth: Oct 13, 1931  Transition of Care [facility]) CM/SW Contact  Purcell Mouton, RN Phone Number: 03/31/2020, 1:33 PM  Clinical Narrative:    Michael [name] [phone] was called and given update.    Expected Discharge Plan: [place]    Expected Discharge Plan and Services Expected Discharge Plan: [place]         Expected Discharge Date: 03/30/20               DME Arranged: N/A                     Social Determinants of Health (SDOH) Interventions    Readmission Risk Interventions No flowsheet data found.

## 2020-03-31 NOTE — TOC Progression Note (Signed)
Transition of Care [facility]) - Progression Note    Patient Details  Name: Michael [name] MRN: [id number] Date of Birth: [birth year]  Transition of Care [facility]) CM/SW Contact  Ross Ludwig, Erin Phone Number:  03/31/2020, 9:57 AM  Clinical Narrative:     CSW received a voice mail from patient's son Lennette Bihari on Sunday July 18 twice, Monday July 19th twice, CSW spoke to patient's son at 11:38am and informed him that Lorenza Chick is the case Freight forwarder and she is speaking to patient's wife Festus Holts.  Case manager Cookie, was speaking to patient's wife, and asked if she would like one of her son's to be the main contact, and wife said no she would like to be the main contact.  CSW informed patient's son Lennette Bihari he needs to speak to his mother.       Expected Discharge Plan: [place]    Expected Discharge Plan and Services Expected Discharge Plan: [place]         Expected Discharge Date: 03/30/20               DME Arranged: N/A                     Social Determinants of Health (SDOH) Interventions    Readmission Risk Interventions No flowsheet data found.

## 2020-03-31 NOTE — Progress Notes (Signed)
Physical Therapy Treatment Patient Details Name: Michael [name] MRN: [id number] DOB: September 12, 1932 Today's Date: 03/31/2020    History of Present Illness Pt is an 86 year old male s/p R direct anterior THA with hx of pneumonia, HTN, SVT, colon cancer, L TKA    PT Comments    Pt reports needing to use BSC so encouraged to ambulate however pt too weak to ambulate to bathroom so BSC near bed.  Pt ambulated 5 feet to [facility] and then recliner placed beside BSC.  Pt with great difficulty standing from [facility] and required +2 assist.  Continue to recommend SNF upon d/c as pt requiring increased assist for mobility.   Follow Up Recommendations  Follow surgeon's recommendation for DC plan and follow-up therapies;SNF     Equipment Recommendations  None recommended by PT    Recommendations for Other Services       Precautions / Restrictions Precautions Precautions: Fall Precaution Comments: very HOH, hears better in right ear    Mobility  Bed Mobility Overal bed mobility: Needs Assistance Bed Mobility: Supine to Sit     Supine to sit: Mod assist;HOB elevated     General bed mobility comments: visual cues for self assist; assist for R LE and trunk upright  Transfers Overall transfer level: Needs assistance Equipment used: Rolling walker (2 wheeled) Transfers: Sit to/from Stand Sit to Stand: Max assist;Mod assist Stand pivot transfers: +2 safety/equipment       General transfer comment: tactile and visual cues for positioning and sequencing, then needed ModA to stand from bed but MaxA from [facility]; pt with coughing again today and reports feeling poorly; requiring more assist off [facility]  Ambulation/Gait Ambulation/Gait assistance: Min assist Gait Distance (Feet): 5 Feet Assistive device: Rolling walker (2 wheeled) Gait Pattern/deviations: Step-to pattern;Decreased stance time - right;Antalgic;Trunk flexed     General Gait Details: only able to ambulate short distance over to [facility] and then  recliner brought close to [place]             Wheelchair Mobility    Modified Rankin (Stroke Patients Only)       Balance                                            Cognition Arousal/Alertness: Awake/alert Behavior During Therapy: WFL for tasks assessed/performed Overall Cognitive Status: Within Functional Limits for tasks assessed                                 General Comments: pt reports word recognition difficulties      Exercises      General Comments        Pertinent Vitals/Pain Pain Assessment: Faces Faces Pain Scale: Hurts little more Pain Location: right hip Pain Descriptors / Indicators: Aching;Discomfort;Sore Pain Intervention(s): Monitored during session;Repositioned    Home Living                      Prior Function            PT Goals (current goals can now be found in the care plan section) Acute Rehab PT Goals PT Goal Formulation: With patient Time For Goal Achievement: 04/07/20 Potential to Achieve Goals: Good Progress towards PT goals: Progressing toward goals    Frequency    7X/week  PT Plan Current plan remains appropriate    Co-evaluation              AM-PAC PT "6 Clicks" Mobility   Outcome Measure  Help needed turning from your back to your side while in a flat bed without using bedrails?: A Little Help needed moving from lying on your back to sitting on the side of a flat bed without using bedrails?: A Lot Help needed moving to and from a bed to a chair (including a wheelchair)?: A Lot Help needed standing up from a chair using your arms (e.g., wheelchair or bedside chair)?: A Lot Help needed to walk in hospital room?: Total Help needed climbing 3-5 steps with a railing? : Total 6 Click Score: 11    End of Session Equipment Utilized During Treatment: Gait belt Activity Tolerance: Patient tolerated treatment well Patient left: in chair;with chair alarm  set;with call bell/phone within reach Nurse Communication: Mobility status PT Visit Diagnosis: Other abnormalities of gait and mobility (R26.89)     Time: 1445-1535 PT Time Calculation (min) (ACUTE ONLY): 50 min  Charges:  $Therapeutic Activity: 23-37 mins                     Jannette Spanner PT, DPT Acute Rehabilitation Services Pager: [phone] Office: [phone]  York Ram E 03/31/2020, 3:44 PM

## 2020-04-01 NOTE — Progress Notes (Signed)
Physical Therapy Treatment Patient Details Name: Michael [name] MRN: [id number] DOB: 11-19-1931 Today's Date: 04/01/2020    History of Present Illness Pt is an 86 year old male s/p R direct anterior THA with hx of pneumonia, HTN, SVT, colon cancer, L TKA    PT Comments    POD # 8 Pt is getting weaker with his extended stay.  Assisted OOB to [facility] first for a BM. General bed mobility comments: visual cues for self assist; assist for R LE and trunk upright.  General transfer comment: from elevated bed to [facility] 1/4 pivot unsteady required 50% VC's on proper hand transfer and turn completion.  Shaky.  HIGH FALL RISK.  Extended time sitting on BSC for much needed BM. Large amount dark soft pudding like.   Assisted with peri care as pt was unable to self perform.  Assisted with amb.  General Gait Details: Unstaedy gait with poor forward flex posture and mild tremors due to weakness/effort.  Limited distance.  Only able to amb in room "I got to sit down". Recliner brought to pt from behind.  Too fatigued after using BSC.  Positioned in recliner with multiple pillows.  Pt awaiting placement at SNF for ST Rehab.   Follow Up Recommendations  SNF     Equipment Recommendations  None recommended by PT    Recommendations for Other Services       Precautions / Restrictions Precautions Precautions: Fall Precaution Comments: very HOH, hears better in right ear Restrictions Weight Bearing Restrictions: No Other Position/Activity Restrictions: WBAT    Mobility  Bed Mobility Overal bed mobility: Needs Assistance Bed Mobility: Supine to Sit     Supine to sit: Mod assist;Max assist     General bed mobility comments: visual cues for self assist; assist for R LE and trunk upright  Transfers Overall transfer level: Needs assistance Equipment used: Rolling walker (2 wheeled) Transfers: Sit to/from [facility] Sit to Stand: Mod assist Stand pivot transfers: Mod assist;Max assist        General transfer comment: from elevated bed to [facility] 1/4 pivot unsteady required 50% VC's on proper hand transfer and turn completion.  Shaky.  HIGH FALL RISK  Ambulation/Gait Ambulation/Gait assistance: Min assist;Mod assist;+2 safety/equipment Gait Distance (Feet): 9 Feet Assistive device: Rolling walker (2 wheeled) Gait Pattern/deviations: Step-to pattern;Decreased stance time - right;Antalgic;Trunk flexed Gait velocity: decreased   General Gait Details: Unstaedy gait with poor forward flex posture and mild tremors due to weakness/effort.  Limited distance.  Only able to amb in room "I got to sit down". Recliner brought to pt from behind.   Stairs             Wheelchair Mobility    Modified Rankin (Stroke Patients Only)       Balance                                            Cognition Arousal/Alertness: Awake/alert Behavior During Therapy: WFL for tasks assessed/performed Overall Cognitive Status: Within Functional Limits for tasks assessed                                 General Comments: AxO x 3 required increased time to process and respond with some word finding as well      Exercises  General Comments        Pertinent Vitals/Pain Pain Assessment: Faces Faces Pain Scale: Hurts little more Pain Location: right hip Pain Descriptors / Indicators: Aching;Discomfort;Sore Pain Intervention(s): Monitored during session;Repositioned    Home Living                      Prior Function            PT Goals (current goals can now be found in the care plan section) Progress towards PT goals: Progressing toward goals    Frequency    7X/week      PT Plan Current plan remains appropriate    Co-evaluation              AM-PAC PT "6 Clicks" Mobility   Outcome Measure  Help needed turning from your back to your side while in a flat bed without using bedrails?: A Lot Help needed moving from lying on  your back to sitting on the side of a flat bed without using bedrails?: A Lot Help needed moving to and from a bed to a chair (including a wheelchair)?: A Lot Help needed standing up from a chair using your arms (e.g., wheelchair or bedside chair)?: A Lot Help needed to walk in hospital room?: Total Help needed climbing 3-5 steps with a railing? : Total 6 Click Score: 10    End of Session Equipment Utilized During Treatment: Gait belt Activity Tolerance: Patient limited by fatigue Patient left: in chair;with chair alarm set;with call bell/phone within reach Nurse Communication: Mobility status PT Visit Diagnosis: Other abnormalities of gait and mobility (R26.89)     Time: 4098-1191 PT Time Calculation (min) (ACUTE ONLY): 42 min  Charges:  $Gait Training: 8-22 mins $Therapeutic Activity: 23-37 mins                     Rica Koyanagi  PTA Acute  Rehabilitation Services Pager      [phone] Office      [phone]

## 2020-04-01 NOTE — Progress Notes (Signed)
     Subjective: 8 Days Post-Op Procedure(s) (LRB): TOTAL HIP ARTHROPLASTY ANTERIOR APPROACH (Right)   Patient expresses no significant pain.  HOH and difficult to communicate with.  Tried to communicate that the plan would be for a SNF placement if cleared on Friday.     Objective:   VITALS:   Vitals:   04/01/20 0536 04/01/20 0716  BP: (!) 127/56 107/85  Pulse: 70 75  Resp: 20   Temp: 98.7 F (37.1 C) 98 F (36.7 C)  SpO2: 95% 95%    Dorsiflexion/Plantar flexion intact Incision: dressing C/D/I No cellulitis present Compartment soft  LABS No results for input(s): HGB, HCT, WBC, PLT in the last 72 hours.  No results for input(s): NA, K, BUN, CREATININE, GLUCOSE in the last 72 hours.   Assessment/Plan: 8 Days Post-Op Procedure(s) (LRB): TOTAL HIP ARTHROPLASTY ANTERIOR APPROACH (Right)   Up with therapy Discharge to SNF when ready / accepted        Danae Orleans PA-C  [facility]  [place] [address], [address], [place], [place] [address] Phone: [phone] [email or web address] Facebook  [employer]

## 2020-04-02 NOTE — Care Management Important Message (Signed)
Important Message  Patient Details IM Letter given to Gabriel Earing RN Case Manager to present to the Patient Name: Michael [name] MRN: [id number] Date of Birth: Jul 26, 1932   Medicare Important Message Given:  Yes     Kerin Salen 04/02/2020, 9:45 AM

## 2020-04-02 NOTE — Progress Notes (Signed)
PT Cancellation Note  Patient Details Name: Michael [name] MRN: [id number] DOB: [birth year]   Cancelled Treatment:     Pt back in bed this afternoon and requested to "keep sleeping".  "I have not slept much".  Pt is awaiting A SNF bed but will continue to Tx during his Acute Stay.    Rica Koyanagi  PTA Acute  Rehabilitation Services Pager      [phone] Office      [phone]

## 2020-04-02 NOTE — Plan of Care (Signed)
  Problem: Activity: Goal: Risk for activity intolerance will decrease Outcome: Progressing   

## 2020-04-02 NOTE — Progress Notes (Signed)
Wife called and updates given. Rajvir Ernster, Laurel Dimmer, RN

## 2020-04-02 NOTE — TOC Progression Note (Signed)
Transition of Care [facility]) - Progression Note    Patient Details  Name: TABIAS SWAYZE MRN: [id number] Date of Birth: 1932-07-14  Transition of Care [facility]) CM/SW Contact  Purcell Mouton, RN Phone Number: 04/02/2020, 1:15 PM  Clinical Narrative:     Spoke with pt's son Lennette Bihari concerning SNF. Lennette Bihari asked that I check with [facility] because his mother had called beds are available. Explained to Lennette Bihari that [facility] had declined this pt. CM called [facility], spoke with admission coordinator who declined pt again. A call back to son to share this information. The plan is COVID test in AM if pt is negative [place]. Son Lennette Bihari agreed of this plan.   Expected Discharge Plan: [place]    Expected Discharge Plan and Services Expected Discharge Plan: [place]         Expected Discharge Date: 03/30/20               DME Arranged: N/A                     Social Determinants of Health (SDOH) Interventions    Readmission Risk Interventions No flowsheet data found.

## 2020-04-02 NOTE — Progress Notes (Signed)
     Subjective: 9 Days Post-Op Procedure(s) (LRB): TOTAL HIP ARTHROPLASTY ANTERIOR APPROACH (Right)   Patient reports pain as mild, pain controlled. No reported events throughout the night.  States that he understands when we talk about him going to a facility.  Hasn't been progressing well with PT and will need extra assistance.  Plan on SNF placement when ready/available.     Objective:   VITALS:   Vitals:   04/02/20 0212 04/02/20 0514  BP: (!) 97/53 (!) 101/50  Pulse: 65 63  Resp: 18 16  Temp: 98.5 F (36.9 C) (!) 97.4 F (36.3 C)  SpO2: 97% 94%    Dorsiflexion/Plantar flexion intact Incision: dressing C/D/I No cellulitis present Compartment soft  LABS No results for input(s): HGB, HCT, WBC, PLT in the last 72 hours.  No results for input(s): NA, K, BUN, CREATININE, GLUCOSE in the last 72 hours.   Assessment/Plan: 9 Days Post-Op Procedure(s) (LRB): TOTAL HIP ARTHROPLASTY ANTERIOR APPROACH (Right)   Up with therapy Discharge to SNF when arranged        Danae Orleans PA-C  [facility]  [place] [address], [address], [place], [place] [address] Phone: [phone] [email or web address] Facebook  [employer]

## 2020-04-02 NOTE — Progress Notes (Signed)
Report received from Almon Hercules. No change in assessment. Will continue current plan of care. Dashton Czerwinski, Laurel Dimmer, RN

## 2020-04-03 DIAGNOSIS — G8918 Other acute postprocedural pain: Secondary | ICD-10-CM | POA: Diagnosis not present

## 2020-04-03 DIAGNOSIS — R404 Transient alteration of awareness: Secondary | ICD-10-CM | POA: Diagnosis not present

## 2020-04-03 DIAGNOSIS — R0902 Hypoxemia: Secondary | ICD-10-CM | POA: Diagnosis not present

## 2020-04-03 DIAGNOSIS — R278 Other lack of coordination: Secondary | ICD-10-CM | POA: Diagnosis not present

## 2020-04-03 DIAGNOSIS — R52 Pain, unspecified: Secondary | ICD-10-CM | POA: Diagnosis not present

## 2020-04-03 DIAGNOSIS — M545 Low back pain: Secondary | ICD-10-CM | POA: Diagnosis not present

## 2020-04-03 DIAGNOSIS — M1611 Unilateral primary osteoarthritis, right hip: Secondary | ICD-10-CM | POA: Diagnosis not present

## 2020-04-03 DIAGNOSIS — R05 Cough: Secondary | ICD-10-CM | POA: Diagnosis not present

## 2020-04-03 DIAGNOSIS — J188 Other pneumonia, unspecified organism: Secondary | ICD-10-CM | POA: Diagnosis not present

## 2020-04-03 DIAGNOSIS — Z96649 Presence of unspecified artificial hip joint: Secondary | ICD-10-CM | POA: Diagnosis not present

## 2020-04-03 DIAGNOSIS — R262 Difficulty in walking, not elsewhere classified: Secondary | ICD-10-CM | POA: Diagnosis not present

## 2020-04-03 DIAGNOSIS — I700 Atherosclerosis of aorta: Secondary | ICD-10-CM | POA: Diagnosis not present

## 2020-04-03 DIAGNOSIS — K219 Gastro-esophageal reflux disease without esophagitis: Secondary | ICD-10-CM | POA: Diagnosis not present

## 2020-04-03 DIAGNOSIS — E559 Vitamin D deficiency, unspecified: Secondary | ICD-10-CM | POA: Diagnosis not present

## 2020-04-03 DIAGNOSIS — I471 Supraventricular tachycardia: Secondary | ICD-10-CM | POA: Diagnosis not present

## 2020-04-03 DIAGNOSIS — R5381 Other malaise: Secondary | ICD-10-CM | POA: Diagnosis not present

## 2020-04-03 DIAGNOSIS — Z96641 Presence of right artificial hip joint: Secondary | ICD-10-CM | POA: Diagnosis not present

## 2020-04-03 DIAGNOSIS — Z743 Need for continuous supervision: Secondary | ICD-10-CM | POA: Diagnosis not present

## 2020-04-03 DIAGNOSIS — J189 Pneumonia, unspecified organism: Secondary | ICD-10-CM | POA: Diagnosis not present

## 2020-04-03 DIAGNOSIS — D649 Anemia, unspecified: Secondary | ICD-10-CM | POA: Diagnosis not present

## 2020-04-03 DIAGNOSIS — M6281 Muscle weakness (generalized): Secondary | ICD-10-CM | POA: Diagnosis not present

## 2020-04-03 DIAGNOSIS — M1711 Unilateral primary osteoarthritis, right knee: Secondary | ICD-10-CM | POA: Diagnosis not present

## 2020-04-03 DIAGNOSIS — R918 Other nonspecific abnormal finding of lung field: Secondary | ICD-10-CM | POA: Diagnosis not present

## 2020-04-03 DIAGNOSIS — E785 Hyperlipidemia, unspecified: Secondary | ICD-10-CM | POA: Diagnosis not present

## 2020-04-03 DIAGNOSIS — K5900 Constipation, unspecified: Secondary | ICD-10-CM | POA: Diagnosis not present

## 2020-04-03 DIAGNOSIS — Z471 Aftercare following joint replacement surgery: Secondary | ICD-10-CM | POA: Diagnosis not present

## 2020-04-03 DIAGNOSIS — N183 Chronic kidney disease, stage 3 unspecified: Secondary | ICD-10-CM | POA: Diagnosis not present

## 2020-04-03 DIAGNOSIS — Z419 Encounter for procedure for purposes other than remedying health state, unspecified: Secondary | ICD-10-CM | POA: Diagnosis not present

## 2020-04-03 DIAGNOSIS — I4891 Unspecified atrial fibrillation: Secondary | ICD-10-CM | POA: Diagnosis not present

## 2020-04-03 DIAGNOSIS — M2550 Pain in unspecified joint: Secondary | ICD-10-CM | POA: Diagnosis not present

## 2020-04-03 DIAGNOSIS — Z7401 Bed confinement status: Secondary | ICD-10-CM | POA: Diagnosis not present

## 2020-04-03 DIAGNOSIS — I10 Essential (primary) hypertension: Secondary | ICD-10-CM | POA: Diagnosis not present

## 2020-04-03 DIAGNOSIS — I2510 Atherosclerotic heart disease of native coronary artery without angina pectoris: Secondary | ICD-10-CM | POA: Diagnosis not present

## 2020-04-03 DIAGNOSIS — H9193 Unspecified hearing loss, bilateral: Secondary | ICD-10-CM | POA: Diagnosis not present

## 2020-04-03 DIAGNOSIS — G4700 Insomnia, unspecified: Secondary | ICD-10-CM | POA: Diagnosis not present

## 2020-04-03 LAB — SARS CORONAVIRUS 2 BY RT PCR (HOSPITAL ORDER, PERFORMED IN ~~LOC~~ HOSPITAL LAB): SARS Coronavirus 2: NEGATIVE

## 2020-04-03 MED ORDER — HYDROCODONE-ACETAMINOPHEN 5-325 MG PO TABS: 1.0000 | ORAL_TABLET | Freq: Four times a day (QID) | ORAL | 0 refills | Status: DC | PRN

## 2020-04-03 MED ORDER — METHOCARBAMOL 500 MG PO TABS: 500.0000 mg | ORAL_TABLET | Freq: Four times a day (QID) | ORAL | 0 refills | Status: DC | PRN

## 2020-04-03 NOTE — Progress Notes (Signed)
Handoff report given to [facility], Therapist, sports. SRP, RN

## 2020-04-03 NOTE — Plan of Care (Signed)
  Problem: Activity: Goal: Risk for activity intolerance will decrease Outcome: Progressing   Problem: Education: Goal: Knowledge of the prescribed therapeutic regimen will improve Outcome: Progressing   Problem: Education: Goal: Knowledge of General Education information will improve Description: Including pain rating scale, medication(s)/side effects and non-pharmacologic comfort measures Outcome: Progressing   Problem: Health Behavior/Discharge Planning: Goal: Ability to manage health-related needs will improve Outcome: Progressing   Problem: Clinical Measurements: Goal: Ability to maintain clinical measurements within normal limits will improve Outcome: Progressing Goal: Will remain free from infection Outcome: Progressing Goal: Diagnostic test results will improve Outcome: Progressing Goal: Respiratory complications will improve Outcome: Progressing Goal: Cardiovascular complication will be avoided Outcome: Progressing   Problem: Activity: Goal: Risk for activity intolerance will decrease Outcome: Progressing   Problem: Nutrition: Goal: Adequate nutrition will be maintained Outcome: Progressing   Problem: Coping: Goal: Level of anxiety will decrease Outcome: Progressing   Problem: Elimination: Goal: Will not experience complications related to bowel motility Outcome: Progressing Goal: Will not experience complications related to urinary retention Outcome: Progressing   Problem: Pain Managment: Goal: General experience of comfort will improve Outcome: Progressing   Problem: Safety: Goal: Ability to remain free from injury will improve Outcome: Progressing   Problem: Skin Integrity: Goal: Risk for impaired skin integrity will decrease Outcome: Progressing

## 2020-04-03 NOTE — Progress Notes (Signed)
Report called to Bo Merino, RN at the [facility]. Pt ready called wife on I-phone and pt spoke with spouse. Pt stable in chair waiting for EMS arrival. SRP, RN

## 2020-04-03 NOTE — TOC Progression Note (Addendum)
Transition of Care [facility]) - Progression Note    Patient Details  Name: Michael [name] MRN: [id number] Date of Birth: Feb 17, 1932  Transition of Care [facility]) CM/SW Contact  Purcell Mouton, RN Phone Number: 04/03/2020, 8:55 AM  Clinical Narrative:     A call to [facility] was made to check on Auth for SNF. It is still in review. SNF was called to give results of negative COVID test results. Need auth from insurance and pt may go.   Expected Discharge Plan: [place]    Expected Discharge Plan and Services Expected Discharge Plan: [place]         Expected Discharge Date: 04/03/20               DME Arranged: N/A                     Social Determinants of Health (SDOH) Interventions    Readmission Risk Interventions No flowsheet data found.

## 2020-04-03 NOTE — TOC Progression Note (Signed)
Transition of Care [facility]) - Progression Note    Patient Details  Name: RIZWAN KUYPER MRN: [id number] Date of Birth: Feb 20, 1932  Transition of Care [facility]) CM/SW Contact  Purcell Mouton, RN Phone Number: 04/03/2020, 2:41 PM  Clinical Narrative:    SNF would not accept discharge summary without co-signature. Waiting for MD to sign.    Expected Discharge Plan: [place]    Expected Discharge Plan and Services Expected Discharge Plan: [place]         Expected Discharge Date: 04/03/20               DME Arranged: N/A                     Social Determinants of Health (SDOH) Interventions    Readmission Risk Interventions No flowsheet data found.

## 2020-04-03 NOTE — Progress Notes (Signed)
     Subjective: 10 Days Post-Op Procedure(s) (LRB): TOTAL HIP ARTHROPLASTY ANTERIOR APPROACH (Right)   Patient reports pain as mild, pain controlled. No reported events throughout the night.  Explained that if approved he should be able to go to SNF today as he had a negative covid test.     Objective:   VITALS:   Vitals:   04/02/20 2021 04/03/20 0551  BP: (!) 134/62 (!) 139/70  Pulse: 76 71  Resp: 20 18  Temp: 99.5 F (37.5 C) 98.2 F (36.8 C)  SpO2: 95% 93%    Dorsiflexion/Plantar flexion intact Incision: dressing C/D/I No cellulitis present Compartment soft  LABS No results for input(s): HGB, HCT, WBC, PLT in the last 72 hours.  No results for input(s): NA, K, BUN, CREATININE, GLUCOSE in the last 72 hours.   Assessment/Plan: 10 Days Post-Op Procedure(s) (LRB): TOTAL HIP ARTHROPLASTY ANTERIOR APPROACH (Right)   Up with therapy Discharge to SNF  Follow up in 2 weeks at [facility] Follow up with OLIN,Shaletha Humble D in 2 weeks.  Contact information:  [facility] [address] [place] [phone]             Danae Orleans PA-C  [facility]  [place] [address] [place], [place],  [address] Phone: [phone] [email or web address] Facebook  [employer]

## 2020-04-03 NOTE — Plan of Care (Signed)
  Problem: Activity: °Goal: Risk for activity intolerance will decrease °Outcome: Progressing °  °Problem: Education: °Goal: Knowledge of General Education information will improve °Description: Including pain rating scale, medication(s)/side effects and non-pharmacologic comfort measures °Outcome: Progressing °  °Problem: Health Behavior/Discharge Planning: °Goal: Ability to manage health-related needs will improve °Outcome: Progressing °  °

## 2020-04-03 NOTE — Progress Notes (Signed)
Physical Therapy Treatment Patient Details Name: Michael [name] MRN: [id number] DOB: 10/25/1931 Today's Date: 04/03/2020    History of Present Illness Pt is an 86 year old male s/p R direct anterior THA with hx of pneumonia, HTN, SVT, colon cancer, L TKA    PT Comments    POD # 10  Assisted OOB to [facility] then amb in room followed by THR TE's.  Noted increased swelling R Knee/thigh area.  Pt still on IV fluids.  Feels tired "all the time".   Follow Up Recommendations  SNF     Equipment Recommendations  None recommended by PT    Recommendations for Other Services       Precautions / Restrictions Precautions Precautions: Fall Precaution Comments: very HOH, hears better in right ear Restrictions Other Position/Activity Restrictions: WBAT    Mobility  Bed Mobility Overal bed mobility: Needs Assistance Bed Mobility: Supine to Sit     Supine to sit: Mod assist;Max assist     General bed mobility comments: visual cues for self assist; assist for R LE and trunk upright  Transfers Overall transfer level: Needs assistance Equipment used: Rolling walker (2 wheeled) Transfers: Sit to/from [facility] Sit to Stand: Mod assist Stand pivot transfers: Mod assist;Max assist       General transfer comment: from elevated bed to [facility] 1/4 pivot unsteady required 50% VC's on proper hand transfer and turn completion.  Required 3 attempts to complete.  Ambulation/Gait Ambulation/Gait assistance: Min assist Gait Distance (Feet): 6 Feet Assistive device: Rolling walker (2 wheeled) Gait Pattern/deviations: Step-to pattern;Decreased stance time - right;Antalgic;Trunk flexed Gait velocity: decreased   General Gait Details: Unstaedy gait with poor forward flex posture and mild tremors due to weakness/effort.  Limited distance.  Only able to amb in room "I got to sit down".  Amb from [facility] to recliner.   Stairs             Wheelchair Mobility    Modified Rankin  (Stroke Patients Only)       Balance                                            Cognition Arousal/Alertness: Awake/alert Behavior During Therapy: WFL for tasks assessed/performed Overall Cognitive Status: Within Functional Limits for tasks assessed                                 General Comments: AxO x 3 very HOH      Exercises   Total Hip Replacement TE's following HEP Handout 10 reps ankle pumps 05 reps knee presses 05 reps heel slides 05 reps SAQ's 05 reps ABD Instructed how to use a belt loop to assist  Followed by [facility]    General Comments        Pertinent Vitals/Pain Pain Assessment: Faces Faces Pain Scale: Hurts little more Pain Location: R knee Pain Descriptors / Indicators: Tightness;Sore Pain Intervention(s): Monitored during session;Repositioned    Home Living                      Prior Function            PT Goals (current goals can now be found in the care plan section) Progress towards PT goals: Progressing toward goals    Frequency  7X/week      PT Plan Current plan remains appropriate    Co-evaluation              AM-PAC PT "6 Clicks" Mobility   Outcome Measure  Help needed turning from your back to your side while in a flat bed without using bedrails?: A Lot Help needed moving from lying on your back to sitting on the side of a flat bed without using bedrails?: A Lot Help needed moving to and from a bed to a chair (including a wheelchair)?: A Lot Help needed standing up from a chair using your arms (e.g., wheelchair or bedside chair)?: A Lot Help needed to walk in hospital room?: Total Help needed climbing 3-5 steps with a railing? : Total 6 Click Score: 10    End of Session Equipment Utilized During Treatment: Gait belt   Patient left: in chair;with chair alarm set;with call bell/phone within reach Nurse Communication: Mobility status PT Visit Diagnosis: Other abnormalities of  gait and mobility (R26.89)     Time: 4707-6151 PT Time Calculation (min) (ACUTE ONLY): 25 min  Charges:  $Gait Training: 8-22 mins $Therapeutic Activity: 8-22 mins                     Rica Koyanagi  PTA Acute  Rehabilitation Services Pager      [phone] Office      [phone]

## 2020-04-03 NOTE — TOC Progression Note (Signed)
Transition of Care [facility]) - Progression Note    Patient Details  Name: Michael [name] MRN: [id number] Date of Birth: [birth year]  Transition of Care [facility]) CM/SW Contact  Purcell Mouton, RN Phone Number: 04/03/2020, 1:41 PM  Clinical Narrative:    [place] for the third time asking for auth number. Was told it is still under review. Asked to speak with reviewer. Josem Kaufmann was given #[id number], 7/23-7/27 next review to Alejandro Mulling, RN fax [phone].   Expected Discharge Plan: [place]    Expected Discharge Plan and Services Expected Discharge Plan: [place]         Expected Discharge Date: 04/03/20               DME Arranged: N/A                     Social Determinants of Health (SDOH) Interventions    Readmission Risk Interventions No flowsheet data found.

## 2020-04-03 NOTE — Progress Notes (Signed)
Pt discharged to [facility] via [place]. Pt is alert and oriented X 4. Pain addressed prior to transport. Patient has both hearing aids. All personal belongings given to PTAR.

## 2020-04-05 DIAGNOSIS — R05 Cough: Secondary | ICD-10-CM | POA: Diagnosis not present

## 2020-04-05 DIAGNOSIS — J189 Pneumonia, unspecified organism: Secondary | ICD-10-CM | POA: Diagnosis not present

## 2020-04-06 DIAGNOSIS — Z743 Need for continuous supervision: Secondary | ICD-10-CM | POA: Diagnosis not present

## 2020-04-06 DIAGNOSIS — I10 Essential (primary) hypertension: Secondary | ICD-10-CM | POA: Diagnosis not present

## 2020-04-06 DIAGNOSIS — R52 Pain, unspecified: Secondary | ICD-10-CM | POA: Diagnosis not present

## 2020-04-06 DIAGNOSIS — K5900 Constipation, unspecified: Secondary | ICD-10-CM | POA: Diagnosis not present

## 2020-04-06 DIAGNOSIS — R5381 Other malaise: Secondary | ICD-10-CM | POA: Diagnosis not present

## 2020-04-06 DIAGNOSIS — E785 Hyperlipidemia, unspecified: Secondary | ICD-10-CM | POA: Diagnosis not present

## 2020-04-06 DIAGNOSIS — G8918 Other acute postprocedural pain: Secondary | ICD-10-CM | POA: Diagnosis not present

## 2020-04-08 ENCOUNTER — Telehealth: Payer: Self-pay | Admitting: Family Medicine

## 2020-04-08 NOTE — Telephone Encounter (Signed)
Pt will be coming in on Friday, 7/30, for labs to check his Hemoglobin. He will also be coming in on 8/5 for a hospital follow up.

## 2020-04-09 DIAGNOSIS — J90 Pleural effusion, not elsewhere classified: Secondary | ICD-10-CM | POA: Diagnosis not present

## 2020-04-09 DIAGNOSIS — M7989 Other specified soft tissue disorders: Secondary | ICD-10-CM | POA: Diagnosis not present

## 2020-04-09 DIAGNOSIS — D649 Anemia, unspecified: Secondary | ICD-10-CM | POA: Diagnosis not present

## 2020-04-09 DIAGNOSIS — D509 Iron deficiency anemia, unspecified: Secondary | ICD-10-CM | POA: Diagnosis not present

## 2020-04-09 DIAGNOSIS — D631 Anemia in chronic kidney disease: Secondary | ICD-10-CM | POA: Diagnosis not present

## 2020-04-09 DIAGNOSIS — N189 Chronic kidney disease, unspecified: Secondary | ICD-10-CM | POA: Diagnosis not present

## 2020-04-09 DIAGNOSIS — Z7901 Long term (current) use of anticoagulants: Secondary | ICD-10-CM | POA: Diagnosis not present

## 2020-04-09 DIAGNOSIS — Z9889 Other specified postprocedural states: Secondary | ICD-10-CM | POA: Diagnosis not present

## 2020-04-09 DIAGNOSIS — I4891 Unspecified atrial fibrillation: Secondary | ICD-10-CM | POA: Diagnosis not present

## 2020-04-09 DIAGNOSIS — Z79899 Other long term (current) drug therapy: Secondary | ICD-10-CM | POA: Diagnosis not present

## 2020-04-09 DIAGNOSIS — R5383 Other fatigue: Secondary | ICD-10-CM | POA: Diagnosis not present

## 2020-04-09 DIAGNOSIS — Z85038 Personal history of other malignant neoplasm of large intestine: Secondary | ICD-10-CM | POA: Diagnosis not present

## 2020-04-09 DIAGNOSIS — N179 Acute kidney failure, unspecified: Secondary | ICD-10-CM | POA: Diagnosis not present

## 2020-04-09 DIAGNOSIS — I5031 Acute diastolic (congestive) heart failure: Secondary | ICD-10-CM | POA: Diagnosis not present

## 2020-04-09 DIAGNOSIS — K219 Gastro-esophageal reflux disease without esophagitis: Secondary | ICD-10-CM | POA: Diagnosis not present

## 2020-04-09 DIAGNOSIS — I130 Hypertensive heart and chronic kidney disease with heart failure and stage 1 through stage 4 chronic kidney disease, or unspecified chronic kidney disease: Secondary | ICD-10-CM | POA: Diagnosis not present

## 2020-04-09 DIAGNOSIS — Z7982 Long term (current) use of aspirin: Secondary | ICD-10-CM | POA: Diagnosis not present

## 2020-04-09 DIAGNOSIS — Z20822 Contact with and (suspected) exposure to covid-19: Secondary | ICD-10-CM | POA: Diagnosis not present

## 2020-04-09 DIAGNOSIS — N183 Chronic kidney disease, stage 3 unspecified: Secondary | ICD-10-CM | POA: Diagnosis not present

## 2020-04-09 DIAGNOSIS — H9190 Unspecified hearing loss, unspecified ear: Secondary | ICD-10-CM | POA: Diagnosis not present

## 2020-04-09 DIAGNOSIS — G4700 Insomnia, unspecified: Secondary | ICD-10-CM | POA: Diagnosis not present

## 2020-04-09 DIAGNOSIS — Z96641 Presence of right artificial hip joint: Secondary | ICD-10-CM | POA: Diagnosis not present

## 2020-04-09 DIAGNOSIS — I129 Hypertensive chronic kidney disease with stage 1 through stage 4 chronic kidney disease, or unspecified chronic kidney disease: Secondary | ICD-10-CM | POA: Diagnosis not present

## 2020-04-09 DIAGNOSIS — Z8679 Personal history of other diseases of the circulatory system: Secondary | ICD-10-CM | POA: Diagnosis not present

## 2020-04-09 DIAGNOSIS — I2510 Atherosclerotic heart disease of native coronary artery without angina pectoris: Secondary | ICD-10-CM | POA: Diagnosis not present

## 2020-04-09 DIAGNOSIS — E785 Hyperlipidemia, unspecified: Secondary | ICD-10-CM | POA: Diagnosis not present

## 2020-04-09 NOTE — Telephone Encounter (Signed)
appt has been scheduled for tomorrow. Will see then.

## 2020-04-10 ENCOUNTER — Ambulatory Visit: Payer: Medicare Other | Admitting: Family Medicine

## 2020-04-10 ENCOUNTER — Telehealth: Payer: Self-pay | Admitting: Family Medicine

## 2020-04-10 ENCOUNTER — Other Ambulatory Visit: Payer: Medicare Other

## 2020-04-10 DIAGNOSIS — N179 Acute kidney failure, unspecified: Secondary | ICD-10-CM | POA: Diagnosis not present

## 2020-04-10 DIAGNOSIS — Z9889 Other specified postprocedural states: Secondary | ICD-10-CM | POA: Diagnosis not present

## 2020-04-10 DIAGNOSIS — R5383 Other fatigue: Secondary | ICD-10-CM | POA: Diagnosis not present

## 2020-04-10 DIAGNOSIS — M7989 Other specified soft tissue disorders: Secondary | ICD-10-CM | POA: Diagnosis not present

## 2020-04-10 DIAGNOSIS — D509 Iron deficiency anemia, unspecified: Secondary | ICD-10-CM | POA: Diagnosis not present

## 2020-04-10 DIAGNOSIS — N189 Chronic kidney disease, unspecified: Secondary | ICD-10-CM | POA: Diagnosis not present

## 2020-04-10 NOTE — Telephone Encounter (Signed)
Noted. Agree. Thank yuo.

## 2020-04-10 NOTE — Telephone Encounter (Signed)
[place] [facility] number [phone]  Pt will be discharged and [facility] Today.   [facility] will be faxing orders for home health and they planning on going out to pt home next week

## 2020-04-11 DIAGNOSIS — N189 Chronic kidney disease, unspecified: Secondary | ICD-10-CM | POA: Diagnosis not present

## 2020-04-11 DIAGNOSIS — I5031 Acute diastolic (congestive) heart failure: Secondary | ICD-10-CM | POA: Diagnosis not present

## 2020-04-11 DIAGNOSIS — N179 Acute kidney failure, unspecified: Secondary | ICD-10-CM | POA: Diagnosis not present

## 2020-04-12 DIAGNOSIS — I5031 Acute diastolic (congestive) heart failure: Secondary | ICD-10-CM | POA: Diagnosis not present

## 2020-04-12 DIAGNOSIS — N179 Acute kidney failure, unspecified: Secondary | ICD-10-CM | POA: Diagnosis not present

## 2020-04-12 DIAGNOSIS — N189 Chronic kidney disease, unspecified: Secondary | ICD-10-CM | POA: Diagnosis not present

## 2020-04-14 DIAGNOSIS — I129 Hypertensive chronic kidney disease with stage 1 through stage 4 chronic kidney disease, or unspecified chronic kidney disease: Secondary | ICD-10-CM | POA: Diagnosis not present

## 2020-04-14 DIAGNOSIS — Z471 Aftercare following joint replacement surgery: Secondary | ICD-10-CM | POA: Diagnosis not present

## 2020-04-14 DIAGNOSIS — R600 Localized edema: Secondary | ICD-10-CM | POA: Diagnosis not present

## 2020-04-14 DIAGNOSIS — D649 Anemia, unspecified: Secondary | ICD-10-CM | POA: Diagnosis not present

## 2020-04-14 DIAGNOSIS — N183 Chronic kidney disease, stage 3 unspecified: Secondary | ICD-10-CM | POA: Diagnosis not present

## 2020-04-14 DIAGNOSIS — E8770 Fluid overload, unspecified: Secondary | ICD-10-CM | POA: Diagnosis not present

## 2020-04-14 DIAGNOSIS — Z96641 Presence of right artificial hip joint: Secondary | ICD-10-CM | POA: Diagnosis not present

## 2020-04-14 DIAGNOSIS — M6281 Muscle weakness (generalized): Secondary | ICD-10-CM | POA: Diagnosis not present

## 2020-04-16 ENCOUNTER — Ambulatory Visit: Payer: Medicare Other | Admitting: Family Medicine

## 2020-04-16 ENCOUNTER — Ambulatory Visit (INDEPENDENT_AMBULATORY_CARE_PROVIDER_SITE_OTHER): Payer: Medicare Other | Admitting: Family Medicine

## 2020-04-16 ENCOUNTER — Other Ambulatory Visit: Payer: Self-pay

## 2020-04-16 ENCOUNTER — Encounter: Payer: Self-pay | Admitting: Family Medicine

## 2020-04-16 VITALS — BP 130/74 | HR 81 | Temp 97.8°F | Ht 70.0 in | Wt 200.2 lb

## 2020-04-16 DIAGNOSIS — R600 Localized edema: Secondary | ICD-10-CM | POA: Diagnosis not present

## 2020-04-16 DIAGNOSIS — M1611 Unilateral primary osteoarthritis, right hip: Secondary | ICD-10-CM

## 2020-04-16 DIAGNOSIS — Z96641 Presence of right artificial hip joint: Secondary | ICD-10-CM

## 2020-04-16 DIAGNOSIS — J690 Pneumonitis due to inhalation of food and vomit: Secondary | ICD-10-CM

## 2020-04-16 DIAGNOSIS — G3183 Dementia with Lewy bodies: Secondary | ICD-10-CM

## 2020-04-16 DIAGNOSIS — I10 Essential (primary) hypertension: Secondary | ICD-10-CM | POA: Diagnosis not present

## 2020-04-16 DIAGNOSIS — D649 Anemia, unspecified: Secondary | ICD-10-CM | POA: Diagnosis not present

## 2020-04-16 DIAGNOSIS — N1831 Chronic kidney disease, stage 3a: Secondary | ICD-10-CM | POA: Diagnosis not present

## 2020-04-16 LAB — VITAMIN B12: Vitamin B-12: 1420 pg/mL — ABNORMAL HIGH (ref 211–911)

## 2020-04-16 LAB — CBC WITH DIFFERENTIAL/PLATELET
Basophils Absolute: 0.1 10*3/uL (ref 0.0–0.1)
Basophils Relative: 1.8 % (ref 0.0–3.0)
Eosinophils Absolute: 0.1 10*3/uL (ref 0.0–0.7)
Eosinophils Relative: 2.3 % (ref 0.0–5.0)
HCT: 31.9 % — ABNORMAL LOW (ref 39.0–52.0)
Hemoglobin: 10.6 g/dL — ABNORMAL LOW (ref 13.0–17.0)
Lymphocytes Relative: 19 % (ref 12.0–46.0)
Lymphs Abs: 1 10*3/uL (ref 0.7–4.0)
MCHC: 33.1 g/dL (ref 30.0–36.0)
MCV: 97.5 fl (ref 78.0–100.0)
Monocytes Absolute: 0.7 10*3/uL (ref 0.1–1.0)
Monocytes Relative: 13.2 % — ABNORMAL HIGH (ref 3.0–12.0)
Neutro Abs: 3.3 10*3/uL (ref 1.4–7.7)
Neutrophils Relative %: 63.7 % (ref 43.0–77.0)
Platelets: 348 10*3/uL (ref 150.0–400.0)
RBC: 3.27 Mil/uL — ABNORMAL LOW (ref 4.22–5.81)
RDW: 16.3 % — ABNORMAL HIGH (ref 11.5–15.5)
WBC: 5.2 10*3/uL (ref 4.0–10.5)

## 2020-04-16 LAB — RENAL FUNCTION PANEL
Albumin: 3.5 g/dL (ref 3.5–5.2)
BUN: 25 mg/dL — ABNORMAL HIGH (ref 6–23)
CO2: 29 mEq/L (ref 19–32)
Calcium: 9.1 mg/dL (ref 8.4–10.5)
Chloride: 105 mEq/L (ref 96–112)
Creatinine, Ser: 1.45 mg/dL (ref 0.40–1.50)
GFR: 46.14 mL/min — ABNORMAL LOW (ref >60.00)
Glucose, Bld: 90 mg/dL (ref 70–99)
Phosphorus: 3.8 mg/dL (ref 2.3–4.6)
Potassium: 4.1 mEq/L (ref 3.5–5.1)
Sodium: 140 mEq/L (ref 135–145)

## 2020-04-16 LAB — MAGNESIUM: Magnesium: 2.3 mg/dL (ref 1.5–2.5)

## 2020-04-16 LAB — FOLATE: Folate: >24.8 ng/mL (ref >5.9)

## 2020-04-16 MED ORDER — POLYETHYLENE GLYCOL 3350 17 G PO PACK: 17.0000 g | PACK | Freq: Every day | ORAL | Status: DC | PRN

## 2020-04-16 MED ORDER — FERROUS SULFATE 325 (65 FE) MG PO TABS: 325.0000 mg | ORAL_TABLET | Freq: Every day | ORAL | Status: DC

## 2020-04-16 MED ORDER — DOCUSATE SODIUM 100 MG PO CAPS
100.0000 mg | ORAL_CAPSULE | Freq: Every day | ORAL | Status: DC | PRN
Start: 2020-04-16 — End: 2020-04-27

## 2020-04-16 NOTE — Assessment & Plan Note (Signed)
Completed azithromycin and cefdinir course, symptoms largely resolved.

## 2020-04-16 NOTE — Assessment & Plan Note (Signed)
Stable period on diltiazem.

## 2020-04-16 NOTE — Patient Instructions (Addendum)
Labs today Glad to see you today - you are doing well  Return as needed or at end of month for follow up visit.

## 2020-04-16 NOTE — Progress Notes (Signed)
This visit was conducted in person.  BP 130/74 (BP Location: Right Arm, Patient Position: Sitting, Cuff Size: Normal)   Pulse 81   Temp 97.8 F (36.6 C) (Temporal)   Ht 5\' 10"  (1.778 m)   Wt 200 lb 3 oz (90.8 kg)   SpO2 99%   BMI 28.72 kg/m    CC: hosp f/u visit  Subjective:    Patient ID: Michael [name], male    DOB: 19-May-1932, 86 y.o.   MRN: [id number]  HPI: Michael [name] is a 86 y.o. male presenting on 04/16/2020 for Hospitalization Follow-up (Pt accompanied by grandson, Troy- temp 97.9.)   Recent R hip replacement surgery Alvan Dame) 03/24/2020. Discharged to [facility] for rehab - poor experience - rehospitalized at [facility] due to aspiration PNA, treated with abx (they think azithromycin and cefdinir) and prednisone course, then concern for acute blood loss with Hgb 5.5, s/p 2 units of pRBC, discharge Hgb 8, came home with son and grandson's assistance. Readmitted to [facility] hospital due to ongoing dark stools. Found to have new leg swelling with pro-BNP 2884 - ?new HFpEF, no DVT on venous US. TTE showed LVEF >55% without DD. Diuresed with lasix 20mg  daily. ?postop thigh hematoma. Received IV iron dextran at [facility]. Hemoccult negative at [facility], DRE negative for blood at [facility]. Attributing black stools to oral iron he had been taking. Started on lasix 20mg  daily PRN leg swelling or weight gain.   + family members had tested positive for COVID however he never tested positive.   Admit Date: 04/09/2020 Discharge Date: 04/13/2020 TCM hosp f/u phone call not completed.  Discharge Destination: Home w/ HHPT  Discharge Diagnoses:  Leg swelling Anemia, unspecified type Primary Diagnosis: Admitted for bilateral LE edema, likely 2/2 HFpEF   Changes Made:  - started on lasix 20 mg PRN for swelling/weight gain  - no indication for antibiotics at discharge  - Hgb was stable, no concern for GIB or further expansion of leg hematoma   Anticipatory Guidance for  Outpatient Provider:  - recommend f/u volume status, weight, and electrolytes at PCP f/u visit. If appears edematous, consider increasing lasix from PRN --> daily   Recommended Follow-up Studies:  - BMP + Mg + standing weight at PCP f/u    Hospital discharge medications reconciled with current med list.       Relevant past medical, surgical, family and social history reviewed and updated as indicated. Interim medical history since our last visit reviewed. Allergies and medications reviewed and updated. Outpatient Medications Prior to Visit  Medication Sig Dispense Refill  . aspirin (ASPIRIN CHILDRENS) 81 MG chewable tablet Chew 1 tablet (81 mg total) by mouth 2 (two) times daily. Take for 4 weeks, then resume regular dose. 60 tablet 0  . Calcium Carb-Cholecalciferol (CALCIUM+D3 PO) Take 1 tablet by mouth daily.    . Cholecalciferol (VITAMIN D3) 125 MCG (5000 UT) CAPS Take 5,000 Units by mouth daily.     . clonazePAM (KLONOPIN) 0.5 MG tablet One or two pills at bedtime (Patient taking differently: Take 0.5-1 mg by mouth at bedtime. ) 60 tablet 5  . Cyanocobalamin (B-12 PO) Take 500 mcg by mouth daily.     Marland Kitchen diltiazem (CARDIZEM CD) 120 MG 24 hr capsule Take 1 capsule (120 mg total) by mouth daily. 90 capsule 3  . flecainide (TAMBOCOR) 100 MG tablet TAKE 1 TABLET BY MOUTH TWICE A DAY (Patient taking differently: Take 100 mg by mouth in the morning and at  bedtime. ) 180 tablet 3  . furosemide (LASIX) 20 MG tablet Take 1 tablet (20 mg total) by mouth daily as needed.    . gabapentin (NEURONTIN) 100 MG capsule Take 100 mg by mouth daily as needed (pain).     Marland Kitchen HYDROcodone-acetaminophen (NORCO) 5-325 MG tablet Take 1-2 tablets by mouth every 6 (six) hours as needed for moderate pain or severe pain. 30 tablet 0  . lovastatin (MEVACOR) 20 MG tablet TAKE 1 TABLET BY MOUTH ONCE DAILY (Patient taking differently: Take 20 mg by mouth daily. ) 90 tablet 2  . Melatonin 10 MG CAPS Take 1 capsule by mouth  at bedtime. (Patient taking differently: Take 10 mg by mouth at bedtime as needed (sleep). ) 30 capsule   . Multiple Vitamins-Minerals (CENTRUM SILVER ADULT 50+ PO) Take 1 tablet by mouth daily. Centrum Silver    . Multiple Vitamins-Minerals (ZINC PO) Take 50 mg by mouth daily.     Marland Kitchen omeprazole (PRILOSEC) 20 MG capsule TAKE 1 CAPSULE BY MOUTH EVERY DAY AS NEEDED. (Patient taking differently: Take 20 mg by mouth daily as needed (acid reflux). ) 90 capsule 3  . QUEtiapine (SEROQUEL) 100 MG tablet Take 50 mg by mouth at bedtime.     . triamcinolone cream (KENALOG) 0.1 % Apply 1 application topically daily as needed (rash).     . zinc gluconate 50 MG tablet Take 50 mg by mouth daily.    Marland Kitchen docusate sodium (COLACE) 100 MG capsule Take 1 capsule (100 mg total) by mouth 2 (two) times daily. 28 capsule 0  . furosemide (LASIX) 20 MG tablet Take by mouth as directed.    . polyethylene glycol (MIRALAX / GLYCOLAX) 17 g packet Take 17 g by mouth 2 (two) times daily. 28 packet 0  . ferrous sulfate (FERROUSUL) 325 (65 FE) MG tablet Take 1 tablet (325 mg total) by mouth 3 (three) times daily with meals for 14 days. 42 tablet 0  . methocarbamol (ROBAXIN) 500 MG tablet Take 1 tablet (500 mg total) by mouth every 6 (six) hours as needed for muscle spasms. (Patient not taking: Reported on 04/16/2020) 40 tablet 0   No facility-administered medications prior to visit.     Per HPI unless specifically indicated in ROS section below Review of Systems Objective:  BP 130/74 (BP Location: Right Arm, Patient Position: Sitting, Cuff Size: Normal)   Pulse 81   Temp 97.8 F (36.6 C) (Temporal)   Ht 5\' 10"  (1.778 m)   Wt 200 lb 3 oz (90.8 kg)   SpO2 99%   BMI 28.72 kg/m   Wt Readings from Last 3 Encounters:  04/16/20 200 lb 3 oz (90.8 kg)  03/24/20 202 lb 6.4 oz (91.8 kg)  02/19/20 205 lb 1 oz (93 kg)      Physical Exam Vitals and nursing note reviewed.  Constitutional:      Appearance: Normal appearance. He is not  ill-appearing.     Comments: Ambulates with walker  Cardiovascular:     Rate and Rhythm: Normal rate and regular rhythm.     Pulses: Normal pulses.     Heart sounds: Normal heart sounds. No murmur heard.   Pulmonary:     Effort: Pulmonary effort is normal. No respiratory distress.     Breath sounds: Normal breath sounds. No wheezing, rhonchi or rales.  Musculoskeletal:     Right lower leg: Edema (1+ pitting) present.     Left lower leg: Edema (tr) present.  Skin:  General: Skin is warm and dry.     Findings: No rash.  Neurological:     Mental Status: He is alert.  Psychiatric:        Mood and Affect: Mood normal.        Behavior: Behavior normal.       Assessment & Plan:  This visit occurred during the SARS-CoV-2 public health emergency.  Safety protocols were in place, including screening questions prior to the visit, additional usage of staff PPE, and extensive cleaning of exam room while observing appropriate contact time as indicated for disinfecting solutions.   Problem List Items Addressed This Visit    Status post right hip replacement    Recovering very well all things considered with good mobility noted today. Seems to be doing well with [facility] and family assistance. Planned ortho f/u later today.       Pedal edema - Primary    Unclear etiology - ?HFpEF given BNP markedly elevated initially - treating with PRN lasix 20mg  with good effect. Anticipate component of postop edema.  Seems almost euvolemic today - continue lasix PRN, update labs today.       Osteoarthritis of right hip   Essential hypertension (Chronic)    Stable period on diltiazem.       Relevant Medications   furosemide (LASIX) 20 MG tablet   Dementia with Lewy bodies (CODE) (HCC)    Mild, stable period. Continues klonopin and seroquel.      CKD (chronic kidney disease) stage 3, GFR 30-59 ml/min (HCC) (Chronic)    Update kidney function.       Aspiration pneumonia ([place])    Completed  azithromycin and cefdinir course, symptoms largely resolved.      Anemia    Mild anemia prior to surgery, significant drop with Hgb down to 5.5 after surgery. Initially thought GI bleed however no evidence of this. There's mention of ?thigh hematoma contributing to acute blood loss - he did receive 2u pRBC at [facility] and IV iron dextran while at [facility]. Update labs today - continue oral iron for now.       Relevant Medications   ferrous sulfate 325 (65 FE) MG tablet   Other Relevant Orders   CBC with Differential/Platelet   Renal function panel   Vitamin B12   Folate   Magnesium       Meds ordered this encounter  Medications  . ferrous sulfate 325 (65 FE) MG tablet    Sig: Take 1 tablet (325 mg total) by mouth daily with breakfast.  . docusate sodium (COLACE) 100 MG capsule    Sig: Take 1 capsule (100 mg total) by mouth daily as needed for mild constipation.  . polyethylene glycol (MIRALAX / GLYCOLAX) 17 g packet    Sig: Take 17 g by mouth daily as needed.   Orders Placed This Encounter  Procedures  . CBC with Differential/Platelet  . Renal function panel  . Vitamin B12  . Folate  . Magnesium    Patient Instructions  Labs today Glad to see you today - you are doing well  Return as needed or at end of month for follow up visit.    Follow up plan: Return if symptoms worsen or fail to improve.  Ria Bush, MD

## 2020-04-16 NOTE — Assessment & Plan Note (Addendum)
Mild, stable period. Continues klonopin and seroquel.

## 2020-04-16 NOTE — Assessment & Plan Note (Addendum)
Update kidney function.  

## 2020-04-16 NOTE — Assessment & Plan Note (Addendum)
Mild anemia prior to surgery, significant drop with Hgb down to 5.5 after surgery. Initially thought GI bleed however no evidence of this. There's mention of ?thigh hematoma contributing to acute blood loss - he did receive 2u pRBC at [facility] and IV iron dextran while at [facility]. Update labs today - continue oral iron for now.

## 2020-04-16 NOTE — Assessment & Plan Note (Addendum)
Unclear etiology - ?HFpEF given BNP markedly elevated initially - treating with PRN lasix 20mg  with good effect. Anticipate component of postop edema.  Seems almost euvolemic today - continue lasix PRN, update labs today.

## 2020-04-16 NOTE — Assessment & Plan Note (Addendum)
Recovering very well all things considered with good mobility noted today. Seems to be doing well with [facility] and family assistance. Planned ortho f/u later today.

## 2020-04-17 DIAGNOSIS — N183 Chronic kidney disease, stage 3 unspecified: Secondary | ICD-10-CM | POA: Diagnosis not present

## 2020-04-17 DIAGNOSIS — D649 Anemia, unspecified: Secondary | ICD-10-CM | POA: Diagnosis not present

## 2020-04-17 DIAGNOSIS — Z96641 Presence of right artificial hip joint: Secondary | ICD-10-CM | POA: Diagnosis not present

## 2020-04-17 DIAGNOSIS — R600 Localized edema: Secondary | ICD-10-CM | POA: Diagnosis not present

## 2020-04-17 DIAGNOSIS — E8770 Fluid overload, unspecified: Secondary | ICD-10-CM | POA: Diagnosis not present

## 2020-04-17 DIAGNOSIS — M6281 Muscle weakness (generalized): Secondary | ICD-10-CM | POA: Diagnosis not present

## 2020-04-17 DIAGNOSIS — I129 Hypertensive chronic kidney disease with stage 1 through stage 4 chronic kidney disease, or unspecified chronic kidney disease: Secondary | ICD-10-CM | POA: Diagnosis not present

## 2020-04-17 DIAGNOSIS — Z471 Aftercare following joint replacement surgery: Secondary | ICD-10-CM | POA: Diagnosis not present

## 2020-04-20 DIAGNOSIS — Z471 Aftercare following joint replacement surgery: Secondary | ICD-10-CM | POA: Diagnosis not present

## 2020-04-20 DIAGNOSIS — Z96641 Presence of right artificial hip joint: Secondary | ICD-10-CM | POA: Diagnosis not present

## 2020-04-21 DIAGNOSIS — I129 Hypertensive chronic kidney disease with stage 1 through stage 4 chronic kidney disease, or unspecified chronic kidney disease: Secondary | ICD-10-CM | POA: Diagnosis not present

## 2020-04-21 DIAGNOSIS — R600 Localized edema: Secondary | ICD-10-CM | POA: Diagnosis not present

## 2020-04-21 DIAGNOSIS — Z96641 Presence of right artificial hip joint: Secondary | ICD-10-CM | POA: Diagnosis not present

## 2020-04-21 DIAGNOSIS — D649 Anemia, unspecified: Secondary | ICD-10-CM | POA: Diagnosis not present

## 2020-04-21 DIAGNOSIS — N183 Chronic kidney disease, stage 3 unspecified: Secondary | ICD-10-CM | POA: Diagnosis not present

## 2020-04-21 DIAGNOSIS — E8770 Fluid overload, unspecified: Secondary | ICD-10-CM | POA: Diagnosis not present

## 2020-04-21 DIAGNOSIS — M6281 Muscle weakness (generalized): Secondary | ICD-10-CM | POA: Diagnosis not present

## 2020-04-21 DIAGNOSIS — Z471 Aftercare following joint replacement surgery: Secondary | ICD-10-CM | POA: Diagnosis not present

## 2020-04-23 DIAGNOSIS — M6281 Muscle weakness (generalized): Secondary | ICD-10-CM | POA: Diagnosis not present

## 2020-04-23 DIAGNOSIS — R600 Localized edema: Secondary | ICD-10-CM | POA: Diagnosis not present

## 2020-04-23 DIAGNOSIS — N183 Chronic kidney disease, stage 3 unspecified: Secondary | ICD-10-CM | POA: Diagnosis not present

## 2020-04-23 DIAGNOSIS — E8770 Fluid overload, unspecified: Secondary | ICD-10-CM | POA: Diagnosis not present

## 2020-04-23 DIAGNOSIS — I129 Hypertensive chronic kidney disease with stage 1 through stage 4 chronic kidney disease, or unspecified chronic kidney disease: Secondary | ICD-10-CM | POA: Diagnosis not present

## 2020-04-23 DIAGNOSIS — Z96641 Presence of right artificial hip joint: Secondary | ICD-10-CM | POA: Diagnosis not present

## 2020-04-23 DIAGNOSIS — Z471 Aftercare following joint replacement surgery: Secondary | ICD-10-CM | POA: Diagnosis not present

## 2020-04-23 DIAGNOSIS — D649 Anemia, unspecified: Secondary | ICD-10-CM | POA: Diagnosis not present

## 2020-04-25 DIAGNOSIS — N183 Chronic kidney disease, stage 3 unspecified: Secondary | ICD-10-CM | POA: Diagnosis not present

## 2020-04-25 DIAGNOSIS — E8770 Fluid overload, unspecified: Secondary | ICD-10-CM | POA: Diagnosis not present

## 2020-04-25 DIAGNOSIS — D649 Anemia, unspecified: Secondary | ICD-10-CM | POA: Diagnosis not present

## 2020-04-25 DIAGNOSIS — M6281 Muscle weakness (generalized): Secondary | ICD-10-CM | POA: Diagnosis not present

## 2020-04-25 DIAGNOSIS — Z471 Aftercare following joint replacement surgery: Secondary | ICD-10-CM | POA: Diagnosis not present

## 2020-04-25 DIAGNOSIS — R600 Localized edema: Secondary | ICD-10-CM | POA: Diagnosis not present

## 2020-04-25 DIAGNOSIS — Z96641 Presence of right artificial hip joint: Secondary | ICD-10-CM | POA: Diagnosis not present

## 2020-04-25 DIAGNOSIS — I129 Hypertensive chronic kidney disease with stage 1 through stage 4 chronic kidney disease, or unspecified chronic kidney disease: Secondary | ICD-10-CM | POA: Diagnosis not present

## 2020-04-26 ENCOUNTER — Other Ambulatory Visit: Payer: Self-pay | Admitting: Family Medicine

## 2020-04-26 MED ORDER — B-12 500 MCG PO TABS: 500.0000 ug | ORAL_TABLET | ORAL | Status: DC

## 2020-04-27 ENCOUNTER — Other Ambulatory Visit: Payer: Self-pay

## 2020-04-27 ENCOUNTER — Ambulatory Visit (INDEPENDENT_AMBULATORY_CARE_PROVIDER_SITE_OTHER): Payer: Medicare Other | Admitting: Family Medicine

## 2020-04-27 VITALS — BP 104/58 | HR 79 | Temp 98.0°F | Ht 70.0 in | Wt 198.5 lb

## 2020-04-27 DIAGNOSIS — M1711 Unilateral primary osteoarthritis, right knee: Secondary | ICD-10-CM

## 2020-04-27 DIAGNOSIS — N183 Chronic kidney disease, stage 3 unspecified: Secondary | ICD-10-CM

## 2020-04-27 DIAGNOSIS — M1611 Unilateral primary osteoarthritis, right hip: Secondary | ICD-10-CM | POA: Diagnosis not present

## 2020-04-27 DIAGNOSIS — G3183 Dementia with Lewy bodies: Secondary | ICD-10-CM

## 2020-04-27 DIAGNOSIS — M25461 Effusion, right knee: Secondary | ICD-10-CM

## 2020-04-27 MED ORDER — PREDNISONE 20 MG PO TABS
ORAL_TABLET | ORAL | 0 refills | Status: DC
Start: 2020-04-27 — End: 2020-05-11

## 2020-04-27 NOTE — Progress Notes (Signed)
Michael Ault T. Treana Lacour, MD, CAQ Sports Medicine  Primary Care and Sports Medicine [facility] [address] [place], [address]  Phone: [phone]   FAX: [phone]  Michael [name] - 86 y.o. male   MRN [id number]   Date of Birth: 31-Dec-1931  Date: 04/27/2020   PCP: Eustaquio Boyden, MD   Referral: Eustaquio Boyden, MD  Chief Complaint  Patient presents with   Joint Swelling    Right Knee-had hip replacement 7/13 and knee has been swollen ever since   Shoulder Pain    Right    This visit occurred during the SARS-CoV-2 public health emergency.  Safety protocols were in place, including screening questions prior to the visit, additional usage of staff PPE, and extensive cleaning of exam room while observing appropriate contact time as indicated for disinfecting solutions.   Subjective:   Michael [name] is a 86 y.o. very pleasant male patient with Body mass index is 28.48 kg/m. who presents with the following:  R sided Knee effusion s/p hip replacement 03/24/2020 by Lajoyce Corners. R He did have a L sided Total knee arthroplasty in 2013 by Dr. Merlyn Albert.  Just had a total hip arthroplasty 1 month ago by Dr. Charlann Boxer.  He presents to me with some right-sided knee effusion.  He does have known osteoarthritis of the right knee.  He did have a prior knee replacement on the left by Dr. Merlyn Albert distantly.  He thinks his right-sided total hip replacement is doing well and he has significant relief of symptoms.  He also does have some dementia with Lewy bodies as well as chronic kidney disease.  He also has R sided shoulder pain.  Having some pain after surgery.  He has been using a walker, and all of his shoulder pain is been after starting this.  Has been doing some ok after surgery.  Still has a fat right knee.  Therapy twie a week. Did a lot last Thursday.  Initially had some in the whole leg.   Review of Systems is noted in the HPI, as appropriate    Objective:   BP (!) 104/58    Pulse 79    Temp 98 F (36.7 C) (Temporal)    Ht 5\' 10"  (1.778 m)    Wt 198 lb 8 oz (90 kg)    SpO2 96%    BMI 28.48 kg/m   GEN: No acute distress; alert,appropriate. PULM: Breathing comfortably in no respiratory distress PSYCH: Normally interactive.    Right knee.  Does have a moderate to large ballotable effusion.  Does have some pain on the medial lateral joint lines.  No significant pain with loading the patellar facets.  Cruciate ligaments are intact as well as MCL and LCL.  Does have pain with forced flexion.  Some pain with extension.  Flexion is limited to 95 degrees  Right shoulder nontender along the clavicle, AC joint as well as the bicipital tendon.  Range of motion is grossly intact with mild, 5 to 10 degrees decreased in abduction and flexion compared to the contralateral side.  Neer testing as well as Leanord Asal testing is somewhat painful.  Speeds testing is also somewhat painful  Radiology: No results found.  Assessment and Plan:     ICD-10-CM   1. Arthritis of right hip  M16.11   2. Primary osteoarthritis of right knee  M17.11   3. Effusion of right knee joint  M25.461   4. Dementia with  Lewy bodies (CODE) (HCC)  G31.83   5. Stage 3 chronic kidney disease, unspecified whether stage 3a or 3b CKD  N18.30    1 month status post right hip total arthroplasty done by Dr. Charlann Boxer.  He does have certainly a knee effusion and known hip arthritis.  I do not feel comfortable with aspirating his right knee so close to his hip replacement, and I will defer that to his total joint replacement surgeon.  He also does have some right-sided tendinitis.  I think that both of these are related to his current use of a walker after his hip replacement and he normally never uses this.  I do think it is reasonable to give him a course of some steroids to help with all of the above.  If his large knee effusion continues then I told him and his wife to  discuss this with Dr. Charlann Boxer at his follow-up.  Follow-up: No follow-ups on file.  Meds ordered this encounter  Medications   predniSONE (DELTASONE) 20 MG tablet    Sig: 2 tabs po daily for 5 days, then 1 tab po daily for 5 days    Dispense:  15 tablet    Refill:  0   Medications Discontinued During This Encounter  Medication Reason   docusate sodium (COLACE) 100 MG capsule Completed Course   ferrous sulfate 325 (65 FE) MG tablet Completed Course   polyethylene glycol (MIRALAX / GLYCOLAX) 17 g packet Completed Course   No orders of the defined types were placed in this encounter.   Signed,  Elpidio Galea. Quinn Bartling, MD   Outpatient Encounter Medications as of 04/27/2020  Medication Sig   aspirin EC 81 MG tablet Take 81 mg by mouth daily. Swallow whole.   Calcium Carb-Cholecalciferol (CALCIUM+D3 PO) Take 1 tablet by mouth daily.   Cholecalciferol (VITAMIN D3) 125 MCG (5000 UT) CAPS Take 5,000 Units by mouth daily.    clonazePAM (KLONOPIN) 0.5 MG tablet One or two pills at bedtime   Cyanocobalamin (B-12) 500 MCG TABS Take 500 mcg by mouth every Monday, Wednesday, and Friday.   diltiazem (CARDIZEM CD) 120 MG 24 hr capsule Take 1 capsule (120 mg total) by mouth daily.   flecainide (TAMBOCOR) 100 MG tablet TAKE 1 TABLET BY MOUTH TWICE A DAY   furosemide (LASIX) 20 MG tablet Take 1 tablet (20 mg total) by mouth daily as needed.   gabapentin (NEURONTIN) 100 MG capsule Take 100 mg by mouth daily as needed (pain).    HYDROcodone-acetaminophen (NORCO) 5-325 MG tablet Take 1-2 tablets by mouth every 6 (six) hours as needed for moderate pain or severe pain.   lovastatin (MEVACOR) 20 MG tablet TAKE 1 TABLET BY MOUTH ONCE DAILY   Melatonin 10 MG CAPS Take 1 capsule by mouth at bedtime.   Multiple Vitamins-Minerals (CENTRUM SILVER ADULT 50+ PO) Take 1 tablet by mouth daily. Centrum Silver   Multiple Vitamins-Minerals (ZINC PO) Take 50 mg by mouth daily.    omeprazole (PRILOSEC) 20  MG capsule TAKE 1 CAPSULE BY MOUTH EVERY DAY AS NEEDED.   QUEtiapine (SEROQUEL) 100 MG tablet Take 50 mg by mouth at bedtime.    triamcinolone cream (KENALOG) 0.1 % Apply 1 application topically daily as needed (rash).    zinc gluconate 50 MG tablet Take 50 mg by mouth daily.   predniSONE (DELTASONE) 20 MG tablet 2 tabs po daily for 5 days, then 1 tab po daily for 5 days   [DISCONTINUED] docusate sodium (COLACE) 100 MG  capsule Take 1 capsule (100 mg total) by mouth daily as needed for mild constipation.   [DISCONTINUED] ferrous sulfate 325 (65 FE) MG tablet Take 1 tablet (325 mg total) by mouth daily with breakfast.   [DISCONTINUED] polyethylene glycol (MIRALAX / GLYCOLAX) 17 g packet Take 17 g by mouth daily as needed.   No facility-administered encounter medications on file as of 04/27/2020.

## 2020-04-27 NOTE — Patient Instructions (Signed)
Good luck with your therapy!

## 2020-04-28 ENCOUNTER — Encounter: Payer: Self-pay | Admitting: Family Medicine

## 2020-04-30 DIAGNOSIS — I129 Hypertensive chronic kidney disease with stage 1 through stage 4 chronic kidney disease, or unspecified chronic kidney disease: Secondary | ICD-10-CM | POA: Diagnosis not present

## 2020-04-30 DIAGNOSIS — N183 Chronic kidney disease, stage 3 unspecified: Secondary | ICD-10-CM | POA: Diagnosis not present

## 2020-04-30 DIAGNOSIS — M6281 Muscle weakness (generalized): Secondary | ICD-10-CM | POA: Diagnosis not present

## 2020-04-30 DIAGNOSIS — E8770 Fluid overload, unspecified: Secondary | ICD-10-CM | POA: Diagnosis not present

## 2020-04-30 DIAGNOSIS — Z96641 Presence of right artificial hip joint: Secondary | ICD-10-CM | POA: Diagnosis not present

## 2020-04-30 DIAGNOSIS — R600 Localized edema: Secondary | ICD-10-CM | POA: Diagnosis not present

## 2020-04-30 DIAGNOSIS — D649 Anemia, unspecified: Secondary | ICD-10-CM | POA: Diagnosis not present

## 2020-04-30 DIAGNOSIS — Z471 Aftercare following joint replacement surgery: Secondary | ICD-10-CM | POA: Diagnosis not present

## 2020-05-04 DIAGNOSIS — R600 Localized edema: Secondary | ICD-10-CM | POA: Diagnosis not present

## 2020-05-04 DIAGNOSIS — Z471 Aftercare following joint replacement surgery: Secondary | ICD-10-CM | POA: Diagnosis not present

## 2020-05-04 DIAGNOSIS — N183 Chronic kidney disease, stage 3 unspecified: Secondary | ICD-10-CM | POA: Diagnosis not present

## 2020-05-04 DIAGNOSIS — M6281 Muscle weakness (generalized): Secondary | ICD-10-CM | POA: Diagnosis not present

## 2020-05-04 DIAGNOSIS — Z96641 Presence of right artificial hip joint: Secondary | ICD-10-CM | POA: Diagnosis not present

## 2020-05-04 DIAGNOSIS — E8770 Fluid overload, unspecified: Secondary | ICD-10-CM | POA: Diagnosis not present

## 2020-05-04 DIAGNOSIS — I129 Hypertensive chronic kidney disease with stage 1 through stage 4 chronic kidney disease, or unspecified chronic kidney disease: Secondary | ICD-10-CM | POA: Diagnosis not present

## 2020-05-04 DIAGNOSIS — D649 Anemia, unspecified: Secondary | ICD-10-CM | POA: Diagnosis not present

## 2020-05-05 ENCOUNTER — Other Ambulatory Visit: Payer: Self-pay | Admitting: Family Medicine

## 2020-05-05 DIAGNOSIS — F0280 Dementia in other diseases classified elsewhere without behavioral disturbance: Secondary | ICD-10-CM

## 2020-05-05 DIAGNOSIS — N183 Chronic kidney disease, stage 3 unspecified: Secondary | ICD-10-CM

## 2020-05-05 DIAGNOSIS — I480 Paroxysmal atrial fibrillation: Secondary | ICD-10-CM

## 2020-05-05 DIAGNOSIS — D649 Anemia, unspecified: Secondary | ICD-10-CM | POA: Diagnosis not present

## 2020-05-05 DIAGNOSIS — Z96641 Presence of right artificial hip joint: Secondary | ICD-10-CM | POA: Diagnosis not present

## 2020-05-05 DIAGNOSIS — R600 Localized edema: Secondary | ICD-10-CM

## 2020-05-05 DIAGNOSIS — E785 Hyperlipidemia, unspecified: Secondary | ICD-10-CM

## 2020-05-05 DIAGNOSIS — I129 Hypertensive chronic kidney disease with stage 1 through stage 4 chronic kidney disease, or unspecified chronic kidney disease: Secondary | ICD-10-CM | POA: Diagnosis not present

## 2020-05-05 DIAGNOSIS — G3183 Dementia with Lewy bodies: Secondary | ICD-10-CM

## 2020-05-05 DIAGNOSIS — Z471 Aftercare following joint replacement surgery: Secondary | ICD-10-CM | POA: Diagnosis not present

## 2020-05-05 DIAGNOSIS — M6281 Muscle weakness (generalized): Secondary | ICD-10-CM

## 2020-05-05 DIAGNOSIS — E8770 Fluid overload, unspecified: Secondary | ICD-10-CM | POA: Diagnosis not present

## 2020-05-06 DIAGNOSIS — Z96641 Presence of right artificial hip joint: Secondary | ICD-10-CM | POA: Diagnosis not present

## 2020-05-06 DIAGNOSIS — E8770 Fluid overload, unspecified: Secondary | ICD-10-CM | POA: Diagnosis not present

## 2020-05-06 DIAGNOSIS — Z471 Aftercare following joint replacement surgery: Secondary | ICD-10-CM | POA: Diagnosis not present

## 2020-05-06 DIAGNOSIS — I129 Hypertensive chronic kidney disease with stage 1 through stage 4 chronic kidney disease, or unspecified chronic kidney disease: Secondary | ICD-10-CM | POA: Diagnosis not present

## 2020-05-06 DIAGNOSIS — M6281 Muscle weakness (generalized): Secondary | ICD-10-CM | POA: Diagnosis not present

## 2020-05-06 DIAGNOSIS — N183 Chronic kidney disease, stage 3 unspecified: Secondary | ICD-10-CM | POA: Diagnosis not present

## 2020-05-06 DIAGNOSIS — R600 Localized edema: Secondary | ICD-10-CM | POA: Diagnosis not present

## 2020-05-06 DIAGNOSIS — D649 Anemia, unspecified: Secondary | ICD-10-CM | POA: Diagnosis not present

## 2020-05-07 ENCOUNTER — Other Ambulatory Visit: Payer: Self-pay

## 2020-05-07 ENCOUNTER — Other Ambulatory Visit (INDEPENDENT_AMBULATORY_CARE_PROVIDER_SITE_OTHER): Payer: Medicare Other

## 2020-05-07 DIAGNOSIS — D649 Anemia, unspecified: Secondary | ICD-10-CM

## 2020-05-07 DIAGNOSIS — E785 Hyperlipidemia, unspecified: Secondary | ICD-10-CM | POA: Diagnosis not present

## 2020-05-07 DIAGNOSIS — N183 Chronic kidney disease, stage 3 unspecified: Secondary | ICD-10-CM

## 2020-05-07 DIAGNOSIS — I480 Paroxysmal atrial fibrillation: Secondary | ICD-10-CM | POA: Diagnosis not present

## 2020-05-07 LAB — CBC WITH DIFFERENTIAL/PLATELET
Basophils Absolute: 0 10*3/uL (ref 0.0–0.1)
Basophils Relative: 0.2 % (ref 0.0–3.0)
Eosinophils Absolute: 0 10*3/uL (ref 0.0–0.7)
Eosinophils Relative: 0.4 % (ref 0.0–5.0)
HCT: 36.5 % — ABNORMAL LOW (ref 39.0–52.0)
Hemoglobin: 12.1 g/dL — ABNORMAL LOW (ref 13.0–17.0)
Lymphocytes Relative: 24.2 % (ref 12.0–46.0)
Lymphs Abs: 1.8 10*3/uL (ref 0.7–4.0)
MCHC: 33.2 g/dL (ref 30.0–36.0)
MCV: 98.1 fl (ref 78.0–100.0)
Monocytes Absolute: 0.8 10*3/uL (ref 0.1–1.0)
Monocytes Relative: 10.6 % (ref 3.0–12.0)
Neutro Abs: 4.8 10*3/uL (ref 1.4–7.7)
Neutrophils Relative %: 64.6 % (ref 43.0–77.0)
Platelets: 261 10*3/uL (ref 150.0–400.0)
RBC: 3.72 Mil/uL — ABNORMAL LOW (ref 4.22–5.81)
RDW: 16.5 % — ABNORMAL HIGH (ref 11.5–15.5)
WBC: 7.4 10*3/uL (ref 4.0–10.5)

## 2020-05-07 LAB — COMPREHENSIVE METABOLIC PANEL
ALT: 21 U/L (ref 0–53)
AST: 18 U/L (ref 0–37)
Albumin: 3.5 g/dL (ref 3.5–5.2)
Alkaline Phosphatase: 56 U/L (ref 39–117)
BUN: 40 mg/dL — ABNORMAL HIGH (ref 6–23)
CO2: 32 mEq/L (ref 19–32)
Calcium: 9.1 mg/dL (ref 8.4–10.5)
Chloride: 103 mEq/L (ref 96–112)
Creatinine, Ser: 1.71 mg/dL — ABNORMAL HIGH (ref 0.40–1.50)
GFR: 38.14 mL/min — ABNORMAL LOW (ref >60.00)
Glucose, Bld: 73 mg/dL (ref 70–99)
Potassium: 4.2 mEq/L (ref 3.5–5.1)
Sodium: 141 mEq/L (ref 135–145)
Total Bilirubin: 0.4 mg/dL (ref 0.2–1.2)
Total Protein: 6.3 g/dL (ref 6.0–8.3)

## 2020-05-07 LAB — LIPID PANEL
Cholesterol: 138 mg/dL (ref 0–200)
HDL: 64.3 mg/dL (ref >39.00)
LDL Cholesterol: 54 mg/dL (ref 0–99)
NonHDL: 73.25
Total CHOL/HDL Ratio: 2
Triglycerides: 97 mg/dL (ref 0.0–149.0)
VLDL: 19.4 mg/dL (ref 0.0–40.0)

## 2020-05-07 LAB — VITAMIN D 25 HYDROXY (VIT D DEFICIENCY, FRACTURES): VITD: 79.71 ng/mL (ref 30.00–100.00)

## 2020-05-07 LAB — MICROALBUMIN / CREATININE URINE RATIO
Creatinine,U: 86.2 mg/dL
Microalb Creat Ratio: 2.5 mg/g (ref 0.0–30.0)
Microalb, Ur: 2.2 mg/dL — ABNORMAL HIGH (ref 0.0–1.9)

## 2020-05-11 ENCOUNTER — Encounter: Payer: Self-pay | Admitting: Family Medicine

## 2020-05-11 ENCOUNTER — Ambulatory Visit (INDEPENDENT_AMBULATORY_CARE_PROVIDER_SITE_OTHER): Payer: Medicare Other | Admitting: Family Medicine

## 2020-05-11 ENCOUNTER — Other Ambulatory Visit: Payer: Self-pay

## 2020-05-11 VITALS — BP 132/70 | HR 68 | Temp 97.9°F | Ht 68.0 in | Wt 198.5 lb

## 2020-05-11 DIAGNOSIS — M6281 Muscle weakness (generalized): Secondary | ICD-10-CM | POA: Diagnosis not present

## 2020-05-11 DIAGNOSIS — N1832 Chronic kidney disease, stage 3b: Secondary | ICD-10-CM

## 2020-05-11 DIAGNOSIS — Z96641 Presence of right artificial hip joint: Secondary | ICD-10-CM | POA: Diagnosis not present

## 2020-05-11 DIAGNOSIS — R440 Auditory hallucinations: Secondary | ICD-10-CM

## 2020-05-11 DIAGNOSIS — G3183 Dementia with Lewy bodies: Secondary | ICD-10-CM

## 2020-05-11 DIAGNOSIS — Z471 Aftercare following joint replacement surgery: Secondary | ICD-10-CM | POA: Diagnosis not present

## 2020-05-11 DIAGNOSIS — Z0000 Encounter for general adult medical examination without abnormal findings: Secondary | ICD-10-CM | POA: Diagnosis not present

## 2020-05-11 DIAGNOSIS — N183 Chronic kidney disease, stage 3 unspecified: Secondary | ICD-10-CM | POA: Diagnosis not present

## 2020-05-11 DIAGNOSIS — R600 Localized edema: Secondary | ICD-10-CM | POA: Diagnosis not present

## 2020-05-11 DIAGNOSIS — H9193 Unspecified hearing loss, bilateral: Secondary | ICD-10-CM

## 2020-05-11 DIAGNOSIS — I129 Hypertensive chronic kidney disease with stage 1 through stage 4 chronic kidney disease, or unspecified chronic kidney disease: Secondary | ICD-10-CM | POA: Diagnosis not present

## 2020-05-11 DIAGNOSIS — F5104 Psychophysiologic insomnia: Secondary | ICD-10-CM

## 2020-05-11 DIAGNOSIS — E8770 Fluid overload, unspecified: Secondary | ICD-10-CM | POA: Diagnosis not present

## 2020-05-11 DIAGNOSIS — I471 Supraventricular tachycardia: Secondary | ICD-10-CM

## 2020-05-11 DIAGNOSIS — Z7189 Other specified counseling: Secondary | ICD-10-CM

## 2020-05-11 DIAGNOSIS — I700 Atherosclerosis of aorta: Secondary | ICD-10-CM

## 2020-05-11 DIAGNOSIS — M25511 Pain in right shoulder: Secondary | ICD-10-CM

## 2020-05-11 DIAGNOSIS — D649 Anemia, unspecified: Secondary | ICD-10-CM | POA: Diagnosis not present

## 2020-05-11 DIAGNOSIS — G4752 REM sleep behavior disorder: Secondary | ICD-10-CM

## 2020-05-11 DIAGNOSIS — E785 Hyperlipidemia, unspecified: Secondary | ICD-10-CM

## 2020-05-11 NOTE — Assessment & Plan Note (Signed)

## 2020-05-11 NOTE — Assessment & Plan Note (Signed)
Preventative protocols reviewed and updated unless pt declined. Discussed healthy diet and lifestyle.  

## 2020-05-11 NOTE — Progress Notes (Signed)
This visit was conducted in person.  BP 132/70 (BP Location: Left Arm, Patient Position: Sitting, Cuff Size: Large)   Pulse 68   Temp 97.9 F (36.6 C) (Temporal)   Ht 5\' 8"  (1.727 m)   Wt 198 lb 8 oz (90 kg)   SpO2 98%   BMI 30.18 kg/m    CC: AMW  Subjective:    Patient ID: Michael [name], male    DOB: Dec 08, 1931, 86 y.o.   MRN: [id number]  HPI: Michael [name] is a 86 y.o. male presenting on 05/11/2020 for Medicare Wellness (Pt accompanied by wife, [name]- temp 98.2.)   Did not see health advisor this year.    Hearing Screening   125Hz  250Hz  500Hz  1000Hz  2000Hz  3000Hz  4000Hz  6000Hz  8000Hz   Right ear:           Left ear:           Comments: Wears bilateral hearing aids.  Wearing at today's OV.   Vision Screening Comments: Last eye exam, 09/2019.    Office Visit from 05/11/2020 in [facility] [place]  PHQ-2 Total Score 0      Fall Risk  05/11/2020 05/08/2019 04/26/2018 12/19/2016 11/10/2015  Falls in the past year? 0 0 No Yes No  Number falls in past yr: - - - 1 -  Injury with Fall? - - - No -      Saw Dr Patsy Lager for R knee effusion after R hip replacement. R sided tendonitis treated with prednisone course. Had possible R thigh hematoma after hip replacement leading to acute blood loss Hgb down to 5.5 s/p 2u pRBC at [facility] then IV iron at [facility]. Started on oral lasix 20mg    Ongoing R shoulder pain for 3 weeks. He had cortisone injection to L shoulder several months ago prior to hip replacement with significant benefit.   Preventative: H/o colon cancer s/p hemicolectomy 1998 (Dr Katrinka Blazing). Last colonoscopy 2016 Arlyce Dice) - mild diverticulosis, stable surgical anastomosis, 5 polyps removed Prostate cancer screening - aged out Lung cancer screening - not eligible Flu shot yearly Tetanus shot - unsure COVID vaccine - Moderna 09/2019, 10/2019  Pneumovax 2014, prevnar 2016 Zostavax - did not receive  Shingrix - discussed. To check with pharmacy.   Advanced directive discussion - has at home. Wife and son are HCPOA. Asked to bring [place] copy.  Seat belt use discussed Sunscreen use discussed. No changing moles on skin. Non smoker Alcohol - 1 glass of wine every 2 wks Dentist - yearly Eye exam - yearly Bowels - no constipation  Bladder - no incontinence. Did not need catheter postop. Denies urinary retention or trouble after surgery or since. Notes urgency with some leaking with prolonged holding.  Married.  Independent with ADLs/ambulation. [name]'s father in law (front office). Son is Psychiatrist in Scientific laboratory technician [employer]. 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 Interim medical history since our last visit reviewed. Allergies and medications reviewed and updated. Outpatient Medications Prior to Visit  Medication Sig Dispense Refill  . Calcium Carb-Cholecalciferol (CALCIUM+D3 PO) Take 1 tablet by mouth daily.    . Cholecalciferol (VITAMIN D3) 125 MCG (5000 UT) CAPS Take 5,000 Units by mouth daily.     . clonazePAM (KLONOPIN) 0.5 MG tablet One or two pills at bedtime 60 tablet 5  . Cyanocobalamin (B-12) 500  MCG TABS Take 500 mcg by mouth every Monday, Wednesday, and Friday.    . diltiazem (CARDIZEM CD) 120 MG 24 hr capsule Take 1 capsule (120 mg total) by mouth daily. 90 capsule 3  . flecainide (TAMBOCOR) 100 MG tablet TAKE 1 TABLET BY MOUTH TWICE A DAY 180 tablet 3  . furosemide (LASIX) 20 MG tablet Take 1 tablet (20 mg total) by mouth daily as needed.    . gabapentin (NEURONTIN) 100 MG capsule Take 100 mg by mouth daily as needed (pain).     Marland Kitchen lovastatin (MEVACOR) 20 MG tablet TAKE 1 TABLET BY MOUTH ONCE DAILY 90 tablet 2  . Melatonin 10 MG CAPS Take 1 capsule by mouth at bedtime. 30 capsule   . Multiple Vitamins-Minerals (CENTRUM SILVER ADULT 50+ PO) Take 1 tablet by  mouth daily. Centrum Silver    . Multiple Vitamins-Minerals (ZINC PO) Take 50 mg by mouth daily.     Marland Kitchen omeprazole (PRILOSEC) 20 MG capsule TAKE 1 CAPSULE BY MOUTH EVERY DAY AS NEEDED. 90 capsule 3  . QUEtiapine (SEROQUEL) 100 MG tablet Take 50 mg by mouth at bedtime.     . triamcinolone cream (KENALOG) 0.1 % Apply 1 application topically daily as needed (rash).     . zinc gluconate 50 MG tablet Take 50 mg by mouth daily.    Marland Kitchen HYDROcodone-acetaminophen (NORCO) 5-325 MG tablet Take 1-2 tablets by mouth every 6 (six) hours as needed for moderate pain or severe pain. 30 tablet 0  . aspirin EC 81 MG tablet Take 81 mg by mouth daily. Swallow whole.    . predniSONE (DELTASONE) 20 MG tablet 2 tabs po daily for 5 days, then 1 tab po daily for 5 days 15 tablet 0   No facility-administered medications prior to visit.     Per HPI unless specifically indicated in ROS section below Review of Systems  Constitutional: Negative for activity change, appetite change, chills, fatigue, fever and unexpected weight change.  HENT: Negative for hearing loss.   Eyes: Negative for visual disturbance.  Respiratory: Negative for cough, chest tightness, shortness of breath and wheezing.   Cardiovascular: Positive for leg swelling. Negative for chest pain and palpitations.  Gastrointestinal: Negative for abdominal distention, abdominal pain, blood in stool, constipation, diarrhea, nausea and vomiting.  Genitourinary: Negative for difficulty urinating and hematuria.  Musculoskeletal: Negative for arthralgias, myalgias and neck pain.  Skin: Negative for rash.  Neurological: Negative for dizziness, seizures, syncope and headaches.  Hematological: Negative for adenopathy. Does not bruise/bleed easily.  Psychiatric/Behavioral: Negative for dysphoric mood. The patient is not nervous/anxious.    Objective:  BP 132/70 (BP Location: Left Arm, Patient Position: Sitting, Cuff Size: Large)   Pulse 68   Temp 97.9 F (36.6 C)  (Temporal)   Ht 5\' 8"  (1.727 m)   Wt 198 lb 8 oz (90 kg)   SpO2 98%   BMI 30.18 kg/m   Wt Readings from Last 3 Encounters:  05/11/20 198 lb 8 oz (90 kg)  04/27/20 198 lb 8 oz (90 kg)  04/16/20 200 lb 3 oz (90.8 kg)      Physical Exam Vitals and nursing note reviewed.  Constitutional:      General: He is not in acute distress.    Appearance: Normal appearance. He is well-developed. He is not ill-appearing.  HENT:     Head: Normocephalic and atraumatic.     Right Ear: Hearing, tympanic membrane, ear canal and external ear normal.     Left Ear:  Hearing, tympanic membrane, ear canal and external ear normal.  Eyes:     General: No scleral icterus.    Extraocular Movements: Extraocular movements intact.     Conjunctiva/sclera: Conjunctivae normal.     Pupils: Pupils are equal, round, and reactive to light.  Cardiovascular:     Rate and Rhythm: Normal rate and regular rhythm.     Pulses: Normal pulses.          Radial pulses are 2+ on the right side and 2+ on the left side.     Heart sounds: Normal heart sounds. No murmur heard.   Pulmonary:     Effort: Pulmonary effort is normal. No respiratory distress.     Breath sounds: Normal breath sounds. No wheezing, rhonchi or rales.  Abdominal:     General: Abdomen is flat. Bowel sounds are normal. There is no distension.     Palpations: Abdomen is soft. There is no mass.     Tenderness: There is no abdominal tenderness. There is no guarding or rebound.     Hernia: No hernia is present.  Musculoskeletal:        General: Normal range of motion.     Cervical back: Normal range of motion and neck supple.     Right lower leg: No edema.     Left lower leg: No edema.  Lymphadenopathy:     Cervical: No cervical adenopathy.  Skin:    General: Skin is warm and dry.     Findings: No rash.  Neurological:     General: No focal deficit present.     Mental Status: He is alert and oriented to person, place, and time.     Comments: CN grossly  intact, station and gait intact  Psychiatric:        Mood and Affect: Mood normal.        Behavior: Behavior normal.        Thought Content: Thought content normal.        Judgment: Judgment normal.       Results for orders placed or performed in visit on 05/07/20  Microalbumin / creatinine urine ratio  Result Value Ref Range   Microalb, Ur 2.2 (H) 0.0 - 1.9 mg/dL   Creatinine,U 40.9 mg/dL   Microalb Creat Ratio 2.5 0.0 - 30.0 mg/g  VITAMIN D 25 Hydroxy (Vit-D Deficiency, Fractures)  Result Value Ref Range   VITD 79.71 30.00 - 100.00 ng/mL  CBC with Differential/Platelet  Result Value Ref Range   WBC 7.4 4.0 - 10.5 K/uL   RBC 3.72 (L) 4.22 - 5.81 Mil/uL   Hemoglobin 12.1 (L) 13.0 - 17.0 g/dL   HCT 81.1 (L) 39 - 52 %   MCV 98.1 78.0 - 100.0 fl   MCHC 33.2 30.0 - 36.0 g/dL   RDW 91.4 (H) 78.2 - 95.6 %   Platelets 261.0 150 - 400 K/uL   Neutrophils Relative % 64.6 43 - 77 %   Lymphocytes Relative 24.2 12 - 46 %   Monocytes Relative 10.6 3 - 12 %   Eosinophils Relative 0.4 0 - 5 %   Basophils Relative 0.2 0 - 3 %   Neutro Abs 4.8 1.4 - 7.7 K/uL   Lymphs Abs 1.8 0.7 - 4.0 K/uL   Monocytes Absolute 0.8 0 - 1 K/uL   Eosinophils Absolute 0.0 0 - 0 K/uL   Basophils Absolute 0.0 0 - 0 K/uL  Lipid panel  Result Value Ref Range   Cholesterol 138 0 -  200 mg/dL   Triglycerides 84.1 0 - 149 mg/dL   HDL 32.44 >01.02 mg/dL   VLDL 72.5 0.0 - 36.6 mg/dL   LDL Cholesterol 54 0 - 99 mg/dL   Total CHOL/HDL Ratio 2    NonHDL 73.25   Comprehensive metabolic panel  Result Value Ref Range   Sodium 141 135 - 145 mEq/L   Potassium 4.2 3.5 - 5.1 mEq/L   Chloride 103 96 - 112 mEq/L   CO2 32 19 - 32 mEq/L   Glucose, Bld 73 70 - 99 mg/dL   BUN 40 (H) 6 - 23 mg/dL   Creatinine, Ser 4.40 (H) 0.40 - 1.50 mg/dL   Total Bilirubin 0.4 0.2 - 1.2 mg/dL   Alkaline Phosphatase 56 39 - 117 U/L   AST 18 0 - 37 U/L   ALT 21 0 - 53 U/L   Total Protein 6.3 6.0 - 8.3 g/dL   Albumin 3.5 3.5 - 5.2 g/dL    GFR 34.74 (L) >25.95 mL/min   Calcium 9.1 8.4 - 10.5 mg/dL   Lab Results  Component Value Date   VITAMINB12 1,420 (H) 04/16/2020    Assessment & Plan:  This visit occurred during the SARS-CoV-2 public health emergency.  Safety protocols were in place, including screening questions prior to the visit, additional usage of staff PPE, and extensive cleaning of exam room while observing appropriate contact time as indicated for disinfecting solutions.   Problem List Items Addressed This Visit    Status post right hip replacement   Right shoulder pain    rec schedule appt with Dr Patsy Lager for further evaluation      REM sleep behavior disorder (Chronic)    Continue nightly klonopin.       PSVT (paroxysmal supraventricular tachycardia) (HCC)    Thought not consistent with Afib. Eliquis has been stopped.  Continues flecainide and diltiazem.       Pedal edema    This has improved, now only on PRN lasix.       Medicare annual wellness visit, subsequent - Primary    I have personally reviewed the Medicare Annual Wellness questionnaire and have noted 1. The patient's medical and social history 2. Their use of alcohol, tobacco or illicit drugs 3. Their current medications and supplements 4. The patient's functional ability including ADL's, fall risks, home safety risks and hearing or visual impairment. Cognitive function has been assessed and addressed as indicated.  5. Diet and physical activity 6. Evidence for depression or mood disorders The patients weight, height, BMI have been recorded in the chart. I have made referrals, counseling and provided education to the patient based on review of the above and I have provided the pt with a written personalized care plan for preventive services. Provider list updated.. See scanned questionairre as needed for further documentation. Reviewed preventative protocols and updated unless pt declined.       Hyperlipidemia LDL goal <100 (Chronic)     Stable period on lovastatin - continue.  The ASCVD Risk score Denman George DC Jr., et al., 2013) failed to calculate for the following reasons:   The 2013 ASCVD risk score is only valid for ages 81 to 59       Health maintenance examination    Preventative protocols reviewed and updated unless pt declined. Discussed healthy diet and lifestyle.       Dementia with Lewy bodies (CODE) (HCC)    Mild, stable period. Has established with neurology.       CKD (  chronic kidney disease) stage 3, GFR 30-59 ml/min (HCC) (Chronic)    Cr ranges 1.4-1.7. Continue to monitor. Encouraged good hydration status. Repeat levels in 3 months. H/o non-obstructing kidney stones. Consider updated imaging at f/u if any worsening.       Chronic insomnia    Continue klonopin.       Bilateral hearing loss    Persistent difficulty despite hearing aide use.       Auditory hallucinations    In setting of LBD. On seroquel for this. Ongoing auditory hallucinations.       Aortic atherosclerosis (HCC)    Continue statin.       Anemia    Anticipate multifactorial - acute blood loss after surgery (?thigh hematoma) as well as component of CKD. Continues improving, will continue to monitor.       Advanced care planning/counseling discussion    Advanced directive discussion - has at home. Wife and son are HCPOA. Asked to bring [place] copy.           No orders of the defined types were placed in this encounter.  No orders of the defined types were placed in this encounter.   Patient instructions: Ensure good hydration status. Limiting anti inflammatories. Recheck levels in 3 months.  Return to see Dr Patsy Lager for right shoulder pain.  Bring [place] copy of your advanced directives.  Good to see you today  Return in 3 months for follow up visit. We will recheck kidneys at that time.  Follow up plan: Return in about 3 months (around 08/11/2020) for follow up visit.  Eustaquio Boyden, MD

## 2020-05-11 NOTE — Assessment & Plan Note (Addendum)
Advanced directive discussion - has at home. Wife and son are HCPOA. Asked to bring [place] copy.

## 2020-05-11 NOTE — Patient Instructions (Addendum)
Ensure good hydration status. Limiting anti inflammatories. Recheck levels in 3 months.  Return to see Dr Lorelei Pont for right shoulder pain.  Bring [place] copy of your advanced directives.  Good to see you today  Return in 3 months for follow up visit. We will recheck kidneys at that time.  Health Maintenance After Age 84 After age 6, you are at a higher risk for certain long-term diseases and infections as well as injuries from falls. Falls are a major cause of broken bones and head injuries in people who are older than age 46. Getting regular preventive care can help to keep you healthy and well. Preventive care includes getting regular testing and making lifestyle changes as recommended by your health care provider. Talk with your health care provider about:  Which screenings and tests you should have. A screening is a test that checks for a disease when you have no symptoms.  A diet and exercise plan that is right for you. What should I know about screenings and tests to prevent falls? Screening and testing are the best ways to find a health problem early. Early diagnosis and treatment give you the best chance of managing medical conditions that are common after age 72. Certain conditions and lifestyle choices may make you more likely to have a fall. Your health care provider may recommend:  Regular vision checks. Poor vision and conditions such as cataracts can make you more likely to have a fall. If you wear glasses, make sure to get your prescription updated if your vision changes.  Medicine review. Work with your health care provider to regularly review all of the medicines you are taking, including over-the-counter medicines. Ask your health care provider about any side effects that may make you more likely to have a fall. Tell your health care provider if any medicines that you take make you feel dizzy or sleepy.  Osteoporosis screening. Osteoporosis is a condition that causes the bones to  get weaker. This can make the bones weak and cause them to break more easily.  Blood pressure screening. Blood pressure changes and medicines to control blood pressure can make you feel dizzy.  Strength and balance checks. Your health care provider may recommend certain tests to check your strength and balance while standing, walking, or changing positions.  Foot health exam. Foot pain and numbness, as well as not wearing proper footwear, can make you more likely to have a fall.  Depression screening. You may be more likely to have a fall if you have a fear of falling, feel emotionally low, or feel unable to do activities that you used to do.  Alcohol use screening. Using too much alcohol can affect your balance and may make you more likely to have a fall. What actions can I take to lower my risk of falls? General instructions  Talk with your health care provider about your risks for falling. Tell your health care provider if: ? You fall. Be sure to tell your health care provider about all falls, even ones that seem minor. ? You feel dizzy, sleepy, or off-balance.  Take over-the-counter and prescription medicines only as told by your health care provider. These include any supplements.  Eat a healthy diet and maintain a healthy weight. A healthy diet includes low-fat dairy products, low-fat (lean) meats, and fiber from whole grains, beans, and lots of fruits and vegetables. Home safety  Remove any tripping hazards, such as rugs, cords, and clutter.  Install safety equipment such as  grab bars in bathrooms and safety rails on stairs.  Keep rooms and walkways well-lit. Activity   Follow a regular exercise program to stay fit. This will help you maintain your balance. Ask your health care provider what types of exercise are appropriate for you.  If you need a cane or walker, use it as recommended by your health care provider.  Wear supportive shoes that have nonskid  soles. Lifestyle  Do not drink alcohol if your health care provider tells you not to drink.  If you drink alcohol, limit how much you have: ? 0-1 drink a day for women. ? 0-2 drinks a day for men.  Be aware of how much alcohol is in your drink. In the [place] one drink equals one typical bottle of beer (12 oz), one-half glass of wine (5 oz), or one shot of hard liquor (1 oz).  Do not use any products that contain nicotine or tobacco, such as cigarettes and e-cigarettes. If you need help quitting, ask your health care provider. Summary  Having a healthy lifestyle and getting preventive care can help to protect your health and wellness after age 82.  Screening and testing are the best way to find a health problem early and help you avoid having a fall. Early diagnosis and treatment give you the best chance for managing medical conditions that are more common for people who are older than age 72.  Falls are a major cause of broken bones and head injuries in people who are older than age 14. Take precautions to prevent a fall at home.  Work with your health care provider to learn what changes you can make to improve your health and wellness and to prevent falls. This information is not intended to replace advice given to you by your health care provider. Make sure you discuss any questions you have with your health care provider. Document Revised: 12/20/2018 Document Reviewed: 07/12/2017 Elsevier Patient Education  2020 [employer].

## 2020-05-14 DIAGNOSIS — M25511 Pain in right shoulder: Secondary | ICD-10-CM | POA: Insufficient documentation

## 2020-05-14 NOTE — Assessment & Plan Note (Signed)
In setting of LBD. On seroquel for this. Ongoing auditory hallucinations.

## 2020-05-14 NOTE — Assessment & Plan Note (Signed)
rec schedule appt with Dr Lorelei Pont for further evaluation

## 2020-05-14 NOTE — Assessment & Plan Note (Signed)
Continue klonopin 

## 2020-05-14 NOTE — Assessment & Plan Note (Signed)
Thought not consistent with Afib. Eliquis has been stopped.  Continues flecainide and diltiazem.

## 2020-05-14 NOTE — Assessment & Plan Note (Signed)
Continue statin. 

## 2020-05-14 NOTE — Assessment & Plan Note (Addendum)
Cr ranges 1.4-1.7. Continue to monitor. Encouraged good hydration status. Repeat levels in 3 months. H/o non-obstructing kidney stones. Consider updated imaging at f/u if any worsening.

## 2020-05-14 NOTE — Assessment & Plan Note (Signed)
Persistent difficulty despite hearing aide use.

## 2020-05-14 NOTE — Assessment & Plan Note (Deleted)
Question of this.  Eliquis has been stopped.  Continues flecainide and diltiazem.

## 2020-05-14 NOTE — Assessment & Plan Note (Signed)
Continue nightly klonopin.

## 2020-05-14 NOTE — Assessment & Plan Note (Signed)
Stable period on lovastatin - continue.  The ASCVD Risk score Michael Bussing DC Jr., et al., 2013) failed to calculate for the following reasons:   The 2013 ASCVD risk score is only valid for ages 31 to 34

## 2020-05-14 NOTE — Assessment & Plan Note (Addendum)
Mild, stable period. Has established with neurology.

## 2020-05-14 NOTE — Assessment & Plan Note (Signed)
Anticipate multifactorial - acute blood loss after surgery (?thigh hematoma) as well as component of CKD. Continues improving, will continue to monitor.

## 2020-05-14 NOTE — Assessment & Plan Note (Signed)
This has improved, now only on PRN lasix.

## 2020-05-19 ENCOUNTER — Telehealth: Payer: Self-pay

## 2020-05-19 DIAGNOSIS — M6281 Muscle weakness (generalized): Secondary | ICD-10-CM | POA: Diagnosis not present

## 2020-05-19 DIAGNOSIS — I129 Hypertensive chronic kidney disease with stage 1 through stage 4 chronic kidney disease, or unspecified chronic kidney disease: Secondary | ICD-10-CM | POA: Diagnosis not present

## 2020-05-19 DIAGNOSIS — D649 Anemia, unspecified: Secondary | ICD-10-CM | POA: Diagnosis not present

## 2020-05-19 DIAGNOSIS — N183 Chronic kidney disease, stage 3 unspecified: Secondary | ICD-10-CM | POA: Diagnosis not present

## 2020-05-19 DIAGNOSIS — Z471 Aftercare following joint replacement surgery: Secondary | ICD-10-CM | POA: Diagnosis not present

## 2020-05-19 DIAGNOSIS — E8770 Fluid overload, unspecified: Secondary | ICD-10-CM | POA: Diagnosis not present

## 2020-05-19 DIAGNOSIS — Z96641 Presence of right artificial hip joint: Secondary | ICD-10-CM | POA: Diagnosis not present

## 2020-05-19 DIAGNOSIS — R600 Localized edema: Secondary | ICD-10-CM | POA: Diagnosis not present

## 2020-05-19 NOTE — Telephone Encounter (Signed)
Please have patient check BP at home over next 3 days and then call us with readings. Agree with limiting salt intake to help control blood pressures.

## 2020-05-19 NOTE — Telephone Encounter (Addendum)
Brad with [facility] left v/m that pt BP was elevated today at 162/90; pt had taken his meds this morning, pt was hydrated and pt was advised by Leroy Sea to watch salt intake. I left v/m for Brad to cb to see if pt had any symptoms such as H/A,. Dizziness, CP or SOB and to verify pts name and DOB that was not extremely clear on v/m.

## 2020-05-19 NOTE — Telephone Encounter (Signed)
I spoke with Brad with [facility] and pt took Lasix  20 mg taking 2 tabs about 1:30 pm Brad noted slight swelling in ankles ; Brad said he had taken pts BP x 3 while he was seeing pt today and the 162/90 was the last BP and the lowest BP for the pt today while Brad was with pt. Pt did not have any symptoms; no H/A,dizziness, CP or SOB. Brad did say pt did go out to eat last night and could possibly had taken in more sodium than usual. Sending note to Dr Darnell Level.

## 2020-05-20 ENCOUNTER — Ambulatory Visit (INDEPENDENT_AMBULATORY_CARE_PROVIDER_SITE_OTHER): Payer: Medicare Other | Admitting: Family Medicine

## 2020-05-20 ENCOUNTER — Encounter: Payer: Self-pay | Admitting: Family Medicine

## 2020-05-20 ENCOUNTER — Ambulatory Visit (INDEPENDENT_AMBULATORY_CARE_PROVIDER_SITE_OTHER)
Admission: RE | Admit: 2020-05-20 | Discharge: 2020-05-20 | Disposition: A | Discharge status: Home / Self Care | Payer: Medicare Other | Source: Ambulatory Visit | Attending: Family Medicine | Admitting: Family Medicine

## 2020-05-20 ENCOUNTER — Other Ambulatory Visit: Payer: Self-pay

## 2020-05-20 VITALS — BP 132/70 | HR 74 | Temp 97.8°F | Ht 68.0 in | Wt 199.8 lb

## 2020-05-20 DIAGNOSIS — Z23 Encounter for immunization: Secondary | ICD-10-CM

## 2020-05-20 DIAGNOSIS — G8929 Other chronic pain: Secondary | ICD-10-CM

## 2020-05-20 DIAGNOSIS — M25511 Pain in right shoulder: Secondary | ICD-10-CM | POA: Diagnosis not present

## 2020-05-20 DIAGNOSIS — M19011 Primary osteoarthritis, right shoulder: Secondary | ICD-10-CM | POA: Diagnosis not present

## 2020-05-20 MED ORDER — METHYLPREDNISOLONE ACETATE 40 MG/ML IJ SUSP
80.0000 mg | Freq: Once | INTRAMUSCULAR | Status: AC
Start: 2020-05-20 — End: 2020-05-20
  Administered 2020-05-20: 80 mg via INTRA_ARTICULAR

## 2020-05-20 NOTE — Telephone Encounter (Signed)
Spoke with Michael [name] relaying Dr. Synthia Innocent message.  Expresses his thanks for the call back.  Spoke with pt's wife, Michael [name] (on dpr), relaying Dr. Synthia Innocent message.  She verbalizes understanding and will inform the pt.

## 2020-05-20 NOTE — Progress Notes (Signed)
Anyssa Sharpless T. Roza Creamer, MD, CAQ Sports Medicine  Primary Care and Sports Medicine [facility] at [facility] [address] [place], [address]  Phone: [phone]  FAX: [phone]  Michael [name] - 86 y.o. male  MRN [id number]  Date of Birth: 1932-07-24  Date: 05/20/2020  PCP: Eustaquio Boyden, MD  Referral: Eustaquio Boyden, MD  Chief Complaint  Patient presents with  . Shoulder Pain    bilateral with right shoulder pain hurst worst when lifting arm.    This visit occurred during the SARS-CoV-2 public health emergency.  Safety protocols were in place, including screening questions prior to the visit, additional usage of staff PPE, and extensive cleaning of exam room while observing appropriate contact time as indicated for disinfecting solutions.   Subjective:   Michael [name] is a 86 y.o. very pleasant male patient with Body mass index is 30.37 kg/m. who presents with the following:  R > L shoulder pain: He is a well-known patient, and he has had some longstanding issues with his shoulders both right and left.  Does have known osteoarthritis in both.  He did have a total hip arthroplasty, and one of the physicians at [facility] orthopedics did inject his left shoulder approximately in June of this year.  At this point is right-sided shoulder pain is bothering him quite a bit.  He recalls that he had a rotator cuff repair some 20 or 30 years ago on this side.  He is having pain and loss of motion and pain in the planes of abduction and flexion.  R shoulder is hurting a lot.  L shoulder is hurting him  - injected in June  r injtra [place]  Review of Systems is noted in the HPI, as appropriate   Objective:   BP 132/70   Pulse 74   Temp 97.8 F (36.6 C) (Temporal)   Ht 5\' 8"  (1.727 m)   Wt 199 lb 12 oz (90.6 kg)   SpO2 96%   BMI 30.37 kg/m    GEN: No acute distress; alert,appropriate. PULM: Breathing comfortably in no respiratory  distress PSYCH: Normally interactive.    Right shoulder: Tender to palpation at the [facility] joint, bicipital groove.  He does have some obvious crepitus with motion.  Flexion is to 155 and abduction is to 160.  Strength is 4+ to 5 out of 5 in all directions  Speeds and Yergason's are normal as well as O'Brien testing.  Other additional testing such as Leanord Asal is positive, and any other provokable symptoms are all painful.  Radiology: DG Shoulder Right  Result Date: 05/20/2020 CLINICAL DATA:  Chronic shoulder pain EXAM: RIGHT SHOULDER - 2+ VIEW COMPARISON:  None. FINDINGS: No fracture or dislocation. Moderate glenohumeral degenerative change with mild AC joint degenerative change. High-riding humeral head consistent with rotator cuff disease. IMPRESSION: 1. No acute osseous abnormality. 2. Degenerative changes of the [facility] joint and glenohumeral joint. High-riding humeral head consistent with rotator cuff disease. Electronically Signed   By: Jasmine Pang M.D.   On: 05/20/2020 16:41     Assessment and Plan:     ICD-10-CM   1. Glenohumeral arthritis, right  M19.011   2. Chronic right shoulder pain  M25.511 DG Shoulder Right   G89.29 methylPREDNISolone acetate (DEPO-MEDROL) injection 80 mg  3. Need for influenza vaccination  Z23 Flu Vaccine QUAD High Dose(Fluad)   Certainly has glenohumeral osteoarthritis, but he also has a high riding humeral head which is suggestive of chronic  rotator cuff tear, both decompensated  Recommended some basic conservative care, but given his level of pain and prior response I do think it is an reasonable thing to inject the steroid with some steroids today in his shoulder..  Intraarticular Shoulder Aspiration/Injection Procedure Note Michael [name] Mar 03, 1932 Date of procedure: 05/20/2020  Procedure: Large Joint Aspiration / Injection of Shoulder, Intraarticular, R Indications: Pain  Procedure Details Verbal consent was obtained from the patient. Risks  including infection explained and contrasted with benefits and alternatives. Patient prepped with Chloraprep and Ethyl Chloride used for anesthesia. An intraarticular shoulder injection was performed using the posterior approach; needle placed into joint capsule without difficulty. The patient tolerated the procedure well and had decreased pain post injection. No complications. Injection: 8 cc of Lidocaine 1% and 2 mL Depo-Medrol 40 mg. Needle: 21 gauge, 2 inch   Follow-up: No follow-ups on file.  Meds ordered this encounter  Medications  . methylPREDNISolone acetate (DEPO-MEDROL) injection 80 mg   Medications Discontinued During This Encounter  Medication Reason  . Cyanocobalamin (B-12) 500 MCG TABS Patient Preference  . furosemide (LASIX) 20 MG tablet Patient Preference  . zinc gluconate 50 MG tablet Patient Preference  . QUEtiapine (SEROQUEL) 100 MG tablet Patient Preference  . Cholecalciferol (VITAMIN D3) 125 MCG (5000 UT) CAPS Patient Preference   Orders Placed This Encounter  Procedures  . DG Shoulder Right  . Flu Vaccine QUAD High Dose(Fluad)    Signed,  Charlize Hathaway T. Keyandre Pileggi, MD   Outpatient Encounter Medications as of 05/20/2020  Medication Sig  . Calcium Carb-Cholecalciferol (CALCIUM+D3 PO) Take 1 tablet by mouth daily.  . clonazePAM (KLONOPIN) 0.5 MG tablet One or two pills at bedtime  . diltiazem (CARDIZEM CD) 120 MG 24 hr capsule Take 1 capsule (120 mg total) by mouth daily.  . flecainide (TAMBOCOR) 100 MG tablet TAKE 1 TABLET BY MOUTH TWICE A DAY  . gabapentin (NEURONTIN) 100 MG capsule Take 100 mg by mouth daily as needed (pain).   Marland Kitchen lovastatin (MEVACOR) 20 MG tablet TAKE 1 TABLET BY MOUTH ONCE DAILY  . Melatonin 10 MG CAPS Take 1 capsule by mouth at bedtime.  . Multiple Vitamins-Minerals (CENTRUM SILVER ADULT 50+ PO) Take 1 tablet by mouth daily. Centrum Silver  . Multiple Vitamins-Minerals (ZINC PO) Take 50 mg by mouth daily.   Marland Kitchen omeprazole (PRILOSEC) 20 MG capsule  TAKE 1 CAPSULE BY MOUTH EVERY DAY AS NEEDED.  Marland Kitchen triamcinolone cream (KENALOG) 0.1 % Apply 1 application topically daily as needed (rash).   . [DISCONTINUED] Cholecalciferol (VITAMIN D3) 125 MCG (5000 UT) CAPS Take 5,000 Units by mouth daily.   . [DISCONTINUED] Cyanocobalamin (B-12) 500 MCG TABS Take 500 mcg by mouth every Monday, Wednesday, and Friday.  . [DISCONTINUED] furosemide (LASIX) 20 MG tablet Take 1 tablet (20 mg total) by mouth daily as needed.  . [DISCONTINUED] QUEtiapine (SEROQUEL) 100 MG tablet Take 50 mg by mouth at bedtime.   . [DISCONTINUED] zinc gluconate 50 MG tablet Take 50 mg by mouth daily.  . [EXPIRED] methylPREDNISolone acetate (DEPO-MEDROL) injection 80 mg    No facility-administered encounter medications on file as of 05/20/2020.

## 2020-05-25 DIAGNOSIS — M6281 Muscle weakness (generalized): Secondary | ICD-10-CM | POA: Diagnosis not present

## 2020-05-25 DIAGNOSIS — Z96641 Presence of right artificial hip joint: Secondary | ICD-10-CM | POA: Diagnosis not present

## 2020-05-25 DIAGNOSIS — E8770 Fluid overload, unspecified: Secondary | ICD-10-CM | POA: Diagnosis not present

## 2020-05-25 DIAGNOSIS — R600 Localized edema: Secondary | ICD-10-CM | POA: Diagnosis not present

## 2020-05-25 DIAGNOSIS — Z471 Aftercare following joint replacement surgery: Secondary | ICD-10-CM | POA: Diagnosis not present

## 2020-05-25 DIAGNOSIS — N183 Chronic kidney disease, stage 3 unspecified: Secondary | ICD-10-CM | POA: Diagnosis not present

## 2020-05-25 DIAGNOSIS — I129 Hypertensive chronic kidney disease with stage 1 through stage 4 chronic kidney disease, or unspecified chronic kidney disease: Secondary | ICD-10-CM | POA: Diagnosis not present

## 2020-05-25 DIAGNOSIS — D649 Anemia, unspecified: Secondary | ICD-10-CM | POA: Diagnosis not present

## 2020-06-09 ENCOUNTER — Ambulatory Visit: Payer: Medicare Other | Admitting: Dermatology

## 2020-06-09 ENCOUNTER — Other Ambulatory Visit: Payer: Self-pay

## 2020-06-09 DIAGNOSIS — L910 Hypertrophic scar: Secondary | ICD-10-CM | POA: Diagnosis not present

## 2020-06-09 DIAGNOSIS — L820 Inflamed seborrheic keratosis: Secondary | ICD-10-CM

## 2020-06-09 DIAGNOSIS — L578 Other skin changes due to chronic exposure to nonionizing radiation: Secondary | ICD-10-CM

## 2020-06-09 DIAGNOSIS — L821 Other seborrheic keratosis: Secondary | ICD-10-CM | POA: Diagnosis not present

## 2020-06-09 DIAGNOSIS — D225 Melanocytic nevi of trunk: Secondary | ICD-10-CM | POA: Diagnosis not present

## 2020-06-09 DIAGNOSIS — D229 Melanocytic nevi, unspecified: Secondary | ICD-10-CM

## 2020-06-09 NOTE — Patient Instructions (Addendum)
Recommend daily broad spectrum sunscreen SPF 30+ to sun-exposed areas, reapply every 2 hours as needed. Call for new or changing lesions. Prior to procedure, discussed risks of blister formation, small wound, skin dyspigmentation, or rare scar following cryotherapy.  Liquid nitrogen was applied for 10-12 seconds to the skin lesion and the expected blistering or scabbing reaction explained. Do not pick at the area. Patient reminded to expect hypopigmented scars from the procedure. Return if lesion fails to fully resolve.  Cryotherapy Aftercare  . Wash gently with soap and water everyday.   . Apply Vaseline and Band-Aid daily until healed.   Seborrheic Keratosis  What causes seborrheic keratoses? Seborrheic keratoses are harmless, common skin growths that first appear during adult life.  As time goes by, more growths appear.  Some people may develop a large number of them.  Seborrheic keratoses appear on both covered and uncovered body parts.  They are not caused by sunlight.  The tendency to develop seborrheic keratoses can be inherited.  They vary in color from skin-colored to gray, brown, or even black.  They can be either smooth or have a rough, warty surface.   Seborrheic keratoses are superficial and look as if they were stuck on the skin.  Under the microscope this type of keratosis looks like layers upon layers of skin.  That is why at times the top layer may seem to fall off, but the rest of the growth remains and re-grows.    Treatment Seborrheic keratoses do not need to be treated, but can easily be removed in the office.  Seborrheic keratoses often cause symptoms when they rub on clothing or jewelry.  Lesions can be in the way of shaving.  If they become inflamed, they can cause itching, soreness, or burning.  Removal of a seborrheic keratosis can be accomplished by freezing, burning, or surgery. If any spot bleeds, scabs, or grows rapidly, please return to have it checked, as these can be  an indication of a skin cancer.  

## 2020-06-09 NOTE — Progress Notes (Signed)
Follow-Up Visit   Subjective  Michael [name] is a 86 y.o. male who presents for the following: Follow-up.  Patient presents today for follow up on OV 03/04/20 for ISK treated with LN2 on R low sternum, L axilla x 3, R axilla, R temple at hairline, and R lower and upper eyelid x 3. They have cleared up.  Patient also has area of concern on his left knee that he would like to have evaluated today, states it has been there forever, but he recently squeezed it to see if pus would come out, but only blood came.  He also has a couple other new spots under his arms that get irritated.  The following portions of the chart were reviewed this encounter and updated as appropriate:      Review of Systems:  No other skin or systemic complaints except as noted in HPI or Assessment and Plan.  Objective  Well appearing patient in no apparent distress; mood and affect are within normal limits.  A focused examination was performed including B/L Axilla, R temple, R low sternum, R eye. Relevant physical exam findings are noted in the Assessment and Plan.  Objective  Left Axilla, Left Inner Upper Arm, Right Axilla: Erythematous keratotic or waxy stuck-on papule   Objective  Left Medial Upper Arm: 2.5 cm waxy hypopigmented patch with mild scale and central violaceous discoloration  Objective  Left Abdomen: 3 mm flesh papule  Objective  Left Knee - Anterior: 1.5 x 1 cm firm flesh smooth plaque with dusky erythema   Assessment & Plan  Inflamed seborrheic keratosis (3) Left Inner Upper Arm; Left Axilla; Right Axilla  Cryotherapy today x 3 Prior to procedure, discussed risks of blister formation, small wound, skin dyspigmentation, or rare scar following cryotherapy.    Destruction of lesion - Left Axilla, Left Inner Upper Arm, Right Axilla  Destruction method: electrodesiccation and curettage   Informed consent: discussed and consent obtained   Timeout:  patient name, date of birth, surgical  site, and procedure verified Patient was prepped and draped in usual sterile fashion: area prepped with Isopropyl Alcohol. Curettage performed in three different directions: Yes   Electrodesiccation performed over the curetted area: Yes   Hemostasis achieved with:  pressure, aluminum chloride and electrodesiccation Outcome: patient tolerated procedure well with no complications   Post-procedure details: wound care instructions given   Post-procedure details comment:  Ointment and bandage applied  Seborrheic keratosis Left Medial Upper Arm  With purpura.  Benign-appearing.  Observation.  Call clinic for new or changing lesions.  Recommend daily use of broad spectrum spf 30+ sunscreen to sun-exposed areas.    Nevus Left Abdomen  Benign-appearing.  Observation.  Call clinic for new or changing moles.  Recommend daily use of broad spectrum spf 30+ sunscreen to sun-exposed areas.    Hypertrophic scar Left Knee - Anterior  Benign. Discussed ILK injection, OTC silicon gel topical treatment. Patient defers ILK injections at this time. Will give sample of Serica gel apply bid. Avoid picking.   Actinic Damage - diffuse scaly erythematous macules with underlying dyspigmentation - Recommend daily broad spectrum sunscreen SPF 30+ to sun-exposed areas, reapply every 2 hours as needed.  - Call for new or changing lesions.   Return in about 6 months (around 12/07/2020) for TBSE.  I, Donzetta Kohut, CMA, am acting as scribe for Brendolyn Patty, MD .  Documentation: I have reviewed the above documentation for accuracy and completeness, and I agree with the above.  Baxter Flattery  Nicole Kindred MD

## 2020-06-18 ENCOUNTER — Ambulatory Visit: Payer: Medicare Other | Admitting: Family Medicine

## 2020-06-18 ENCOUNTER — Encounter: Payer: Self-pay | Admitting: Family Medicine

## 2020-06-18 VITALS — BP 133/80 | HR 64 | Ht 70.0 in | Wt 201.0 lb

## 2020-06-18 DIAGNOSIS — G3183 Dementia with Lewy bodies: Secondary | ICD-10-CM

## 2020-06-18 DIAGNOSIS — R440 Auditory hallucinations: Secondary | ICD-10-CM

## 2020-06-18 DIAGNOSIS — G4752 REM sleep behavior disorder: Secondary | ICD-10-CM | POA: Diagnosis not present

## 2020-06-18 NOTE — Patient Instructions (Addendum)
Discuss option of trying Exelon (rivastigmine) with cardiology. I will also send my notes for their review.   Stay well hydrated. Focus on well balanced diet and regular exercise.   Follow up in 3-6 months   Rivastigmine capsules What is this medicine? RIVASTIGMINE (ri va STIG meen) is used to treat mild to moderate dementia caused by Alzheimer's disease or Parkinson's disease. This medicine may be used for other purposes; ask your health care provider or pharmacist if you have questions. COMMON BRAND NAME(S): Exelon What should I tell my health care provider before I take this medicine? They need to know if you have any of these conditions:  difficulty passing urine  heart disease, or irregular or slow heartbeat  kidney disease  liver disease  lung or breathing disease, like asthma  seizures  stomach or intestine disease, ulcers, or stomach bleeding  an unusual or allergic reaction to rivastigmine, other medicines, foods, dyes, or preservatives  pregnant or trying to get pregnant  breast-feeding How should I use this medicine? Take this medicine by mouth with a glass of water. Follow the directions on the prescription label. Take this medicine with food. Take your doses at regular intervals. Do not take your medicine more often than directed. Do not stop taking except on the advice of your doctor or health care professional. Talk to your pediatrician regarding the use of this medicine in children. Special care may be needed. Overdosage: If you think you have taken too much of this medicine contact a poison control center or emergency room at once. NOTE: This medicine is only for you. Do not share this medicine with others. What if I miss a dose? If you miss a dose, take it as soon as you can. If it is almost time for your next dose, take only that dose. Do not take double or extra doses. What may interact with this medicine?  antihistamines for allergy, cough and  cold  atropine  certain medicines for bladder problems like oxybutynin, tolterodine  certain medicines for Parkinson's disease like benztropine, trihexyphenidyl  certain medicines for stomach problems like dicyclomine, hyoscyamine  glycopyrrolate  ipratropium  certain medicines for travel sickness like scopolamine  medicines that relax your muscles for surgery  other medicines for Alzheimer's disease This list may not describe all possible interactions. Give your health care provider a list of all the medicines, herbs, non-prescription drugs, or dietary supplements you use. Also tell them if you smoke, drink alcohol, or use illegal drugs. Some items may interact with your medicine. What should I watch for while using this medicine? Visit your doctor or health care professional for regular checks on your progress. Check with your doctor or health care professional if your symptoms do not get better or if they get worse. You may get drowsy or dizzy. Do not drive, use machinery, or do anything that needs mental alertness until you know how this drug affects you. What side effects may I notice from receiving this medicine? Side effects that you should report to your doctor or health care professional as soon as possible:  allergic reactions like skin rash, itching or hives, swelling of the face, lips, or tongue  changes in vision or balance  feeling faint or lightheaded, falls  increase in frequency of passing urine, or incontinence  nervousness, agitation, or increased confusion  redness, blistering, peeling or loosening of the skin, including inside the mouth  severe diarrhea  slow heartbeat, or palpitations  stomach pain  sweating  uncontrollable movements  vomiting  weight loss Side effects that usually do not require medical attention (report to your doctor or health care professional if they continue or are bothersome):  headache  indigestion or  heartburn  loss of appetite  mild diarrhea, especially when starting treatment  nausea This list may not describe all possible side effects. Call your doctor for medical advice about side effects. You may report side effects to FDA at 1-800-FDA-1088. Where should I keep my medicine? Keep out of reach of children. Store at room temperature between 15 and 30 degrees C (59 and 86 degrees F). Keep container tightly closed. Throw away any unused medicine after the expiration date. NOTE: This sheet is a summary. It may not cover all possible information. If you have questions about this medicine, talk to your doctor, pharmacist, or health care provider.  2020 Elsevier/Gold Standard (2008-04-28 14:15:23)   Memory Compensation Strategies  1. Use "WARM" strategy.  W= write it down  A= associate it  R= repeat it  M= make a mental note  2.   You can keep a Social worker.  Use a 3-ring notebook with sections for the following: calendar, important names and phone numbers,  medications, doctors' names/phone numbers, lists/reminders, and a section to journal what you did  each day.   3.    Use a calendar to write appointments down.  4.    Write yourself a schedule for the day.  This can be placed on the calendar or in a separate section of the Memory Notebook.  Keeping a  regular schedule can help memory.  5.    Use medication organizer with sections for each day or morning/evening pills.  You may need help loading it  6.    Keep a basket, or pegboard by the door.  Place items that you need to take out with you in the basket or on the pegboard.  You may also want to  include a message board for reminders.  7.    Use sticky notes.  Place sticky notes with reminders in a place where the task is performed.  For example: " turn off the  stove" placed by the stove, "lock the door" placed on the door at eye level, " take your medications" on  the bathroom mirror or by the place where you normally  take your medications.  8.    Use alarms/timers.  Use while cooking to remind yourself to check on food or as a reminder to take your medicine, or as a  reminder to make a call, or as a reminder to perform another task, etc.   Lewy Body Dementia Lewy body dementia, also called dementia with Lewy bodies, is a condition that affects the way the brain functions. It is one form of dementia. In this condition, proteins called Lewy bodies build up in certain areas of the brain. This causes problems with:  Memory.  Decision making.  Behavior.  Speaking.  Thinking.  Movements and balance.  Problem solving. This condition is progressive, which means that it gets worse with time (is degenerative) and cannot be reversed. What are the causes? This condition is caused by the buildup of Lewy bodies in brain cells in areas of the brain that control memory, thinking, and movement. It is not known what causes the Lewy bodies to build up. What increases the risk? You are more likely to develop this condition if you:  Have a family history of Lewy body dementia or  Parkinson's disease.  Are 75 years old or older.  Are male. What are the signs or symptoms? Symptoms of this condition may include:  Symptoms of dementia, such as: ? Trouble with memory. ? Trouble paying attention. ? Problems with planning and organizing. ? Problems with judgment. ? Behavioral problems.  Symptoms of Parkinson's disease, such as: ? Shaking movements that you cannot control (tremor). Tremors usually start in a hand or foot when you are resting (resting tremor). ? Stooped posture. ? Slowing of movement. ? Stiff muscles (rigidity). ? Loss of balance and stability when standing.  Seeing things that are not there (hallucinating).  Changes in memory, attention, and concentration that come and go (fluctuation).  Sleep problems, such as acting out dreams while you are asleep. How is this diagnosed? This  condition is diagnosed by a specialist who diagnoses and treats this condition (neurologist). Your health care provider will talk with you and your family, friends, or caregivers about your history and symptoms. A thorough medical history will be taken, and you will have a physical exam and tests. Tests may include:  Lab tests, such as blood or urine tests.  Imaging tests, such as a CT scan, a PET scan, or an MRI.  A test that involves removing and testing a small amount of the fluid that surrounds the brain and spinal cord (lumbar puncture).  A test where small metal discs are used to measure electrical activity in the brain (electroencephalogram or EEG).  Tests that evaluate brain function, such as memory tests, cognitive tests, and neuropsychological tests. How is this treated? There is no cure for this condition. Treatment focuses on managing your symptoms. Treatment may include:  Medicines. Everyone responds to medicines differently. Your response may change over time. Work with your health care provider to find the best medicines for you.  Speech, occupational, and physical therapy. Your health care provider can help direct you to support groups, organizations, and other health care providers who can help with decisions about your care. Follow these instructions at home: Medicine  Take over-the-counter and prescription medicines only as told by your health care provider.  To help you manage your medicines, use a pill organizer or pill reminder.  Avoid taking medicines that can affect thinking, such as pain medicines or sleeping medicines. Lifestyle  Make healthy lifestyle choices: ? Be physically active as told by your health care provider. ? Do not use any products that contain nicotine or tobacco, such as cigarettes, e-cigarettes, and chewing tobacco. If you need help quitting, ask your health care provider. ? Try to practice stress-management techniques when you experience  stress, such as mindfulness, yoga, or deep breathing. ? Stay socially connected. Talk regularly with other people, such as family, friends, and neighbors.  Make sure you sleep well. These tips can help you get a good night's rest: ? Avoid napping during the day. ? Keep your sleeping area dark and cool. ? Avoid exercising a few hours before you go to bed. ? Avoid caffeine products in the evening. Eating and drinking  Do not drink alcohol.  Drink enough fluid to keep your urine pale yellow.  Eat a healthy diet. Safety      Work with your health care provider to determine what you need help with and what your safety needs are.  If you have trouble moving around, use a cane or walker as told by your health care provider.  Make sure your home environment is safe. To do this: ? Remove  things that can be a tripping hazard, such as throw rugs or clutter. ? Install grab bars and railings in your home to prevent falls.  Talk with your health care provider about if it is safe for you to drive.  If you were given a bracelet that identifies you as a person with memory loss or tracks your location, make sure to wear it at all times. General instructions  Work with your family to make important decisions, such as advance directives, medical power of attorney, or a living will.  Keep all follow-up visits as told by your health care provider. This is important. Contact a health care provider if you have:  A fever.  Problems with choking or swallowing.  Any symptoms of a new or different illness.  New or worsening trouble with sleeping or increased daytime sleepiness.  New or worsening confusion. Get help right away if:  You feel depressed, sad, or feel that you want to harm yourself.  Your family members become concerned for your safety. If you ever feel like you may hurt yourself or others, or have thoughts about taking your own life, get help right away. You can go to your  nearest emergency department or call:  Your local emergency services (911 in the [place]  A suicide crisis helpline, such as the [place] at (716) 817-6676. This is open 24 hours a day. Summary  Dementia is a condition that affects the way the brain functions. It often affects memory and thinking.  Lewy body dementia is a degenerative dementia.  This condition is caused by the buildup of proteins called Lewy bodies in brain cells. It is not known what causes the Lewy bodies to build up.  Work with your health care provider to determine what you need help with and what your safety needs are.  Your health care provider can help direct you to support groups, organizations, and other health care providers who can help with decisions about your care. This information is not intended to replace advice given to you by your health care provider. Make sure you discuss any questions you have with your health care provider. Document Revised: 10/25/2018 Document Reviewed: 10/25/2018 Elsevier Patient Education  [place].
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
## 2020-06-23 ENCOUNTER — Telehealth: Payer: Self-pay | Admitting: Family Medicine

## 2020-06-23 MED ORDER — DONEPEZIL HCL 5 MG PO TABS
5.0000 mg | ORAL_TABLET | Freq: Every day | ORAL | 1 refills | Status: DC
Start: 2020-06-23 — End: 2021-02-04

## 2020-06-23 NOTE — Telephone Encounter (Signed)
-----   Message from Minna Merritts, MD sent at 06/21/2020  4:11 PM EDT ----- I would be ok with that, Benefits could be worth it Thx TG ----- Message ----- From: Debbora Presto, NP Sent: 06/18/2020   1:39 PM EDT To: Minna Merritts, MD  Hey there Dr Rockey Situ! We are taking care of Michael [name] with Lewy Body Dementia. I was speaking with the family about trying Exelon or Aricept for concerns of auditory hallucinations. I know that he has a history of PSVT and CAD on flecainide and diltiazem. What are your thoughts about the risks of abnormal rhythm on Exelon or Aricept. Do you feel that we should avoid this medication based on his cardiac history?   Thank you so much for you help!  Debbora Presto, FNP

## 2020-06-23 NOTE — Telephone Encounter (Signed)
I called and LMVM for wife of pt approval from cardiology about perscribing aricept.  Will start at 5mg  po daily and then in future to increase to 10mg  po daily.  [facility] [place].  To call back if questions.

## 2020-06-24 DIAGNOSIS — Z471 Aftercare following joint replacement surgery: Secondary | ICD-10-CM | POA: Diagnosis not present

## 2020-06-24 DIAGNOSIS — Z96641 Presence of right artificial hip joint: Secondary | ICD-10-CM | POA: Diagnosis not present

## 2020-07-07 ENCOUNTER — Other Ambulatory Visit: Payer: Self-pay | Admitting: Neurology

## 2020-07-07 ENCOUNTER — Other Ambulatory Visit: Payer: Self-pay | Admitting: Family Medicine

## 2020-07-07 NOTE — Telephone Encounter (Signed)
Pt's wife called stating that the pt is needing a refill on his clonazePAM (KLONOPIN) 0.5 MG tablet [facility] [place] [facility] the [place] She states the pt is completely out of medication.

## 2020-07-07 NOTE — Telephone Encounter (Signed)
Wife called and wanted this to go to [facility].  She will get first refill at the [facility] as pt needs it.  [facility], will call to cancel the refills at [place].

## 2020-07-08 MED ORDER — CLONAZEPAM 0.5 MG PO TABS: ORAL_TABLET | ORAL | 4 refills | Status: DC

## 2020-07-21 DIAGNOSIS — N183 Chronic kidney disease, stage 3 unspecified: Secondary | ICD-10-CM | POA: Diagnosis not present

## 2020-07-21 DIAGNOSIS — I471 Supraventricular tachycardia: Secondary | ICD-10-CM | POA: Diagnosis not present

## 2020-07-21 DIAGNOSIS — I129 Hypertensive chronic kidney disease with stage 1 through stage 4 chronic kidney disease, or unspecified chronic kidney disease: Secondary | ICD-10-CM | POA: Diagnosis not present

## 2020-10-01 ENCOUNTER — Ambulatory Visit: Payer: Medicare Other | Admitting: Neurology

## 2020-10-15 ENCOUNTER — Encounter: Payer: Self-pay | Admitting: Neurology

## 2020-10-15 ENCOUNTER — Ambulatory Visit: Payer: Medicare Other | Admitting: Neurology

## 2020-10-15 ENCOUNTER — Other Ambulatory Visit: Payer: Self-pay

## 2020-10-15 VITALS — BP 171/86 | HR 62 | Ht 70.0 in | Wt 206.0 lb

## 2020-10-15 DIAGNOSIS — R269 Unspecified abnormalities of gait and mobility: Secondary | ICD-10-CM | POA: Diagnosis not present

## 2020-10-15 DIAGNOSIS — G4752 REM sleep behavior disorder: Secondary | ICD-10-CM | POA: Diagnosis not present

## 2020-10-15 DIAGNOSIS — G3183 Dementia with Lewy bodies: Secondary | ICD-10-CM | POA: Diagnosis not present

## 2020-10-15 DIAGNOSIS — R413 Other amnesia: Secondary | ICD-10-CM | POA: Diagnosis not present

## 2020-10-15 DIAGNOSIS — R440 Auditory hallucinations: Secondary | ICD-10-CM

## 2020-10-15 DIAGNOSIS — H9193 Unspecified hearing loss, bilateral: Secondary | ICD-10-CM

## 2020-10-15 MED ORDER — CLONAZEPAM 0.5 MG PO TABS: ORAL_TABLET | ORAL | 2 refills | Status: DC
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
## 2020-10-21 DIAGNOSIS — I471 Supraventricular tachycardia: Secondary | ICD-10-CM | POA: Diagnosis not present

## 2020-10-21 DIAGNOSIS — N183 Chronic kidney disease, stage 3 unspecified: Secondary | ICD-10-CM | POA: Diagnosis not present

## 2020-10-21 DIAGNOSIS — I129 Hypertensive chronic kidney disease with stage 1 through stage 4 chronic kidney disease, or unspecified chronic kidney disease: Secondary | ICD-10-CM | POA: Diagnosis not present

## 2020-11-18 DIAGNOSIS — I10 Essential (primary) hypertension: Secondary | ICD-10-CM | POA: Diagnosis not present

## 2020-12-08 ENCOUNTER — Encounter: Payer: Medicare Other | Admitting: Dermatology

## 2020-12-10 DIAGNOSIS — I10 Essential (primary) hypertension: Secondary | ICD-10-CM | POA: Diagnosis not present

## 2020-12-14 ENCOUNTER — Ambulatory Visit: Payer: Medicare Other | Admitting: Neurology

## 2021-01-11 NOTE — Progress Notes (Signed)
Cardiology Office Note  Date:  01/12/2021   ID:  Christion, Leonhard Dec 18, 1931, MRN [id number]  PCP:  Juluis Pitch, MD   Chief Complaint  Patient presents with  . 12 month follow up     "doing well." Medications reviewed by the patient verbally.     HPI:  Michael [name] is a pleasant 87 year old gentleman with history of Three-vessel CAD on CT scan Moderate diffuse aortic athero  total knee replacement,  renal dysfunction,  SVT previously seen by Dr. Rayann Heman and plan was made for SVT ablation. in the end he changed his mind and canceled the procedure He presents today for follow-up of his SVT and CAD  Last seen by myself April 2021  No near syncope, no tachycardia No syncope, reports feeling well  BP at home ranging from 539 to 767 systolic Potassium dropping .  Recently started on chlorthalidone Wife concerned he is on too much medication  Not much walking Likes to read  Does not sleep well, follows with psych On sleep aids Has chanting in head, difficult to " turn it off"  early dementia trouble watching TV, even with captions  chronic lower extremity swelling Wears compression hose  EKG on today's visit shows normal sinus rhythm with rate 58 beats a minute, no significant ST or T-wave changes  Other past medical history reviewed Episode of tachycardia September 2020 Likely with SVT on 05/24/2019, 3 hours,  rate 140, with associated hypotension Symptoms similar to prior SVT episodes Previously declined ablation  CT scan September 2017 for pneumonia reviewed with him in detail extensive coronary calcifications, all 3 coronary vessels , at least moderate diffuse aortic atherosclerosis  Previous 30 day monitor  showed runs of SVT, heart rate up to 177 beats per minute associated with dizziness. Continued to have runs of SVT even on diltiazem 120 mg daily   PMH:   has a past medical history of Arthritis, Aspiration pneumonia ([place]), BPH (benign prostatic  hypertrophy), Cellulitis of face (12/21/2019), Chronic back pain, CKD (chronic kidney disease), stage III ([place]), Colon cancer ([place]) (1988), Diverticulosis, Essential hypertension, GERD (gastroesophageal reflux disease), H/O calcium pyrophosphate deposition disease (CPPD), Hiatal hernia (01/06/2015), HLD (hyperlipidemia), HOH (hard of hearing), Impotence of organic origin, Peritonsillar abscess (05/16/2019), Peritonsillar abscess determined by examination (05/15/2019), PSVT (paroxysmal supraventricular tachycardia) ([place]), REM sleep behavior disorder (02/21/2014), Rotator cuff rupture, SVT (supraventricular tachycardia) ([place]), Unspecified hearing loss, and Ventral hernia (1988).  PSH:    Past Surgical History:  Procedure Laterality Date  . EYE SURGERY Bilateral    cataract extraction with IOL bilaterally  . [place]  . INGUINAL HERNIA REPAIR Right 1963  . MASTECTOMY, PARTIAL Left 02/07/2013   LEFT SUBCUTANEOUS MASTECTOMY; Imogene Burn. Georgette Dover, [place] Right 2000    [facility]  . TOTAL HIP ARTHROPLASTY Right 03/24/2020   Procedure: TOTAL HIP ARTHROPLASTY ANTERIOR APPROACH;  Surgeon: Paralee Cancel, MD;  [facility];  Service: Orthopedics;  Laterality: Right;  70 mins  . TOTAL KNEE ARTHROPLASTY  03/05/2012   Procedure: TOTAL KNEE ARTHROPLASTY;  Surgeon: Gearlean Alf, MD;  [facility];  Service: Orthopedics;  Laterality: Left;  . [place] Surgery, x 4  . VEIN LIGATION AND STRIPPING Right     Current Outpatient Medications  Medication Sig Dispense Refill  . Calcium Carb-Cholecalciferol (CALCIUM+D3 PO) Take 1 tablet by mouth daily.    . camphor-menthol (SARNA) lotion APPLY  SMALL AMOUNT TO AFFECTED AREA DAILY    . chlorthalidone (HYGROTON) 25 MG tablet Take 1 tablet by mouth daily.    . Cholecalciferol (D-3-5) 125 MCG (5000 UT) capsule Take 5,000 Units by mouth daily.    . clonazePAM (KLONOPIN) 0.5 MG  tablet TAKE 1 TABLET BY MOUTH AT BEDTIME 90 tablet 2  . Cyanocobalamin (B-12 PO) Take by mouth daily.    . cyanocobalamin 1000 MCG tablet Take by mouth.    . diltiazem (CARDIZEM CD) 120 MG 24 hr capsule Take 1 capsule (120 mg total) by mouth daily. 90 capsule 3  . donepezil (ARICEPT) 5 MG tablet Take 1 tablet (5 mg total) by mouth at bedtime. 90 tablet 1  . flecainide (TAMBOCOR) 100 MG tablet TAKE 1 TABLET BY MOUTH TWICE A DAY 180 tablet 3  . gabapentin (NEURONTIN) 100 MG capsule Take 100 mg by mouth daily as needed (pain).     Marland Kitchen losartan (COZAAR) 50 MG tablet Take 50 mg by mouth in the morning and at bedtime.    . lovastatin (MEVACOR) 20 MG tablet TAKE 1 TABLET BY MOUTH ONCE DAILY 90 tablet 2  . Melatonin 10 MG CAPS Take 1 capsule by mouth at bedtime. 30 capsule   . Multiple Vitamins-Minerals (CENTRUM SILVER ADULT 50+ PO) Take 1 tablet by mouth daily. Centrum Silver    . Multiple Vitamins-Minerals (ZINC PO) Take 50 mg by mouth daily.     Marland Kitchen omeprazole (PRILOSEC) 20 MG capsule TAKE 1 CAPSULE BY MOUTH EVERY DAY AS NEEDED. 90 capsule 3  . Propylene Glycol 0.6 % SOLN INSTILL 1 DROP IN EACH EYE FOUR TIMES A DAY    . QUEtiapine (SEROQUEL) 50 MG tablet Take 50 mg by mouth at bedtime.    . traZODone (DESYREL) 50 MG tablet Take 0.5 tablets by mouth at bedtime.    . triamcinolone cream (KENALOG) 0.1 % Apply 1 application topically daily as needed (rash).     Marland Kitchen VITAMIN A PO Take 3,000 mcg by mouth daily.     No current facility-administered medications for this visit.    Allergies:   Patient has no known allergies.   Social History:  The patient  reports that he has never smoked. He has never used smokeless tobacco. He reports current alcohol use of about 1.0 standard drink of alcohol per week. He reports that he does not use drugs.   Family History:   family history includes Congestive Heart Failure in his mother; Osteoarthritis in his father.    Review of Systems: Review of Systems   Constitutional: Negative.   HENT: Negative.   Respiratory: Negative.   Cardiovascular: Negative.   Gastrointestinal: Negative.   Musculoskeletal: Negative.   Neurological: Negative.   Psychiatric/Behavioral: Negative.   All other systems reviewed and are negative.   PHYSICAL EXAM: VS:  BP 120/80 (BP Location: Left Arm, Patient Position: Sitting, Cuff Size: Normal)   Pulse (!) 58   Ht 5\' 9"  (1.753 m)   Wt 200 lb (90.7 kg)   SpO2 98%   BMI 29.53 kg/m  , BMI Body mass index is 29.53 kg/m. Constitutional:  oriented to person, place, and time. No distress.  Hard of hearing HENT:  Head: Grossly normal Eyes:  no discharge. No scleral icterus.  Neck: No JVD, no carotid bruits  Cardiovascular: Regular rate and rhythm, no murmurs appreciated Trace pitting lower extremity edema, compression hose in place Pulmonary/Chest: Clear to auscultation bilaterally, no wheezes or rails Abdominal: Soft.  no distension.  no  tenderness.  Musculoskeletal: Normal range of motion Neurological:  normal muscle tone. Coordination normal. No atrophy Skin: Skin warm and dry Psychiatric: normal affect, pleasant  Recent Labs: 02/19/2020: Pro B Natriuretic peptide (BNP) 269.0 04/16/2020: Magnesium 2.3 05/07/2020: ALT 21; BUN 40; Creatinine, Ser 1.71; Hemoglobin 12.1; Platelets 261.0; Potassium 4.2; Sodium 141    Lipid Panel Lab Results  Component Value Date   CHOL 138 05/07/2020   HDL 64.30 05/07/2020   LDLCALC 54 05/07/2020   TRIG 97.0 05/07/2020      Wt Readings from Last 3 Encounters:  01/12/21 200 lb (90.7 kg)  10/15/20 206 lb (93.4 kg)  06/18/20 201 lb (91.2 kg)      ASSESSMENT AND PLAN:  Hyperlipidemia LDL  - Plan: EKG 12-Lead Goal LDL less than 70 given severe coronary disease, PAD On statin Numbers at goal  SVT (supraventricular tachycardia) ([place]) - Denies having recent episodes of SVT No orthostasis, dizzy episodes Continue diltiazem and flecainide Previously declined  ablation No indication for anticoagulation  Coronary artery disease involving native coronary artery of native heart without angina pectoris Heavy coronary calcification seen on CT scan Low risk stress test August 2018 Cholesterol at goal Denies anginal symptoms  Aortic atherosclerosis (HCC) Moderate diffuse disease on CT scan September 2017 Cholesterol at goal  Essential hypertension Wife has been monitoring blood pressure at home, ranges reasonable, no medication changes made, added potassium 10 mill equivalents daily She will continue to monitor his pressure and call if blood pressure drops below 601 systolic on a regular basis    Orders Placed This Encounter  Procedures  . EKG 12-Lead     Signed, Esmond Plants, M.D., Ph.D. 01/12/2021  [facility] [place], [place] [phone]

## 2021-01-12 ENCOUNTER — Other Ambulatory Visit: Payer: Self-pay

## 2021-01-12 ENCOUNTER — Ambulatory Visit: Payer: Medicare Other | Admitting: Cardiovascular Disease

## 2021-01-12 ENCOUNTER — Encounter: Payer: Self-pay | Admitting: Cardiovascular Disease

## 2021-01-12 VITALS — BP 120/80 | HR 58 | Ht 69.0 in | Wt 200.0 lb

## 2021-01-12 DIAGNOSIS — I10 Essential (primary) hypertension: Secondary | ICD-10-CM | POA: Diagnosis not present

## 2021-01-12 DIAGNOSIS — I471 Supraventricular tachycardia: Secondary | ICD-10-CM

## 2021-01-12 DIAGNOSIS — I700 Atherosclerosis of aorta: Secondary | ICD-10-CM

## 2021-01-12 DIAGNOSIS — I2510 Atherosclerotic heart disease of native coronary artery without angina pectoris: Secondary | ICD-10-CM

## 2021-01-12 DIAGNOSIS — E785 Hyperlipidemia, unspecified: Secondary | ICD-10-CM

## 2021-01-12 MED ORDER — POTASSIUM CHLORIDE ER 10 MEQ PO TBCR: 10.0000 meq | EXTENDED_RELEASE_TABLET | Freq: Every day | ORAL | 3 refills | Status: DC

## 2021-01-12 NOTE — Patient Instructions (Signed)
Medication Instructions:  Potassium daily  If you need a refill on your cardiac medications before your next appointment, please call your pharmacy.    Lab work: No new labs needed   If you have labs (blood work) drawn today and your tests are completely normal, you will receive your results only by: Marland Kitchen MyChart Message (if you have MyChart) OR . A paper copy in the mail If you have any lab test that is abnormal or we need to change your treatment, we will call you to review the results.   Testing/Procedures: No new testing needed   Follow-Up: At [facility], you and your health needs are our priority.  As part of our continuing mission to provide you with exceptional heart care, we have created designated Provider Care Teams.  These Care Teams include your primary Cardiologist (physician) and Advanced Practice Providers (APPs -  Physician Assistants and Nurse Practitioners) who all work together to provide you with the care you need, when you need it.  . You will need a follow up appointment in 12 months  . Providers on your designated Care Team:   . Murray Hodgkins, NP . Christell Faith, PA-C . Marrianne Mood, PA-C  Any Other Special Instructions Will Be Listed Below (If Applicable).  COVID-19 Vaccine Information can be found at: ShippingScam.co.uk For questions related to vaccine distribution or appointments, please email [email or web address] [email or web address] or call [phone])[phone].

## 2021-01-29 DIAGNOSIS — Z9841 Cataract extraction status, right eye: Secondary | ICD-10-CM | POA: Diagnosis not present

## 2021-01-29 DIAGNOSIS — H0288A Meibomian gland dysfunction right eye, upper and lower eyelids: Secondary | ICD-10-CM | POA: Diagnosis not present

## 2021-01-29 DIAGNOSIS — Z9842 Cataract extraction status, left eye: Secondary | ICD-10-CM | POA: Diagnosis not present

## 2021-01-29 DIAGNOSIS — H52223 Regular astigmatism, bilateral: Secondary | ICD-10-CM | POA: Diagnosis not present

## 2021-02-04 ENCOUNTER — Other Ambulatory Visit: Payer: Self-pay | Admitting: *Deleted

## 2021-02-04 ENCOUNTER — Telehealth: Payer: Self-pay | Admitting: Neurology

## 2021-02-04 MED ORDER — DONEPEZIL HCL 5 MG PO TABS: 5.0000 mg | ORAL_TABLET | Freq: Every day | ORAL | 1 refills | Status: DC

## 2021-02-04 NOTE — Telephone Encounter (Signed)
Pt's wife, Acen Craun request refill donepezil (ARICEPT) 5 MG tablet at [id number]

## 2021-02-04 NOTE — Telephone Encounter (Signed)
E-scribed refill as requested. 

## 2021-02-09 DIAGNOSIS — R0902 Hypoxemia: Secondary | ICD-10-CM | POA: Diagnosis not present

## 2021-02-09 DIAGNOSIS — I2510 Atherosclerotic heart disease of native coronary artery without angina pectoris: Secondary | ICD-10-CM | POA: Diagnosis not present

## 2021-02-09 DIAGNOSIS — I471 Supraventricular tachycardia: Secondary | ICD-10-CM | POA: Diagnosis not present

## 2021-02-16 DIAGNOSIS — I10 Essential (primary) hypertension: Secondary | ICD-10-CM | POA: Diagnosis not present

## 2021-02-16 DIAGNOSIS — R0902 Hypoxemia: Secondary | ICD-10-CM | POA: Diagnosis not present

## 2021-04-14 ENCOUNTER — Ambulatory Visit: Payer: Medicare Other | Admitting: Family Medicine

## 2021-04-14 ENCOUNTER — Telehealth: Payer: Self-pay | Admitting: Neurology

## 2021-04-14 ENCOUNTER — Encounter: Payer: Self-pay | Admitting: Family Medicine

## 2021-04-14 VITALS — BP 129/70 | HR 67 | Ht 69.0 in | Wt 191.5 lb

## 2021-04-14 DIAGNOSIS — G3183 Dementia with Lewy bodies: Secondary | ICD-10-CM | POA: Diagnosis not present

## 2021-04-14 DIAGNOSIS — H9193 Unspecified hearing loss, bilateral: Secondary | ICD-10-CM

## 2021-04-14 DIAGNOSIS — G4752 REM sleep behavior disorder: Secondary | ICD-10-CM | POA: Diagnosis not present

## 2021-04-14 DIAGNOSIS — R440 Auditory hallucinations: Secondary | ICD-10-CM | POA: Diagnosis not present

## 2021-04-14 MED ORDER — DONEPEZIL HCL 10 MG PO TABS: 5.0000 mg | ORAL_TABLET | Freq: Every day | ORAL | 3 refills | Status: DC

## 2021-04-14 NOTE — Progress Notes (Addendum)
PATIENT: Michael [name] DOB: [birth year]  REASON FOR VISIT: follow up HISTORY FROM: patient  Chief Complaint  Patient presents with   Follow-up    Rm 12, w wife Festus Holts. Here for 6 month memory f/u, no change in memory since last OV. Pt states he hears music in his ears and is humming throughout the day.       HISTORY OF PRESENT ILLNESS: 04/14/2021 ALL: Michael [name] returns for follow up for LBD and RBD. He continues donepezil '5mg'$  daily. He feels it may have helped a little with short term memory. He has been working with psychiatry for management of auditory hallucinations. He is now having more visual hallucinations. He described a couple in detail who he sees every day. They have a very strained relationship and couple signs to Michael Tensley. He is able to sing some of the rhymes to me in the office. He is not sleeping well. Psychiatry has stopped clonazepam for now. He has not had any difficulty with worsening RBD. Not acting out dreams. No kicking. He does continue quetiapine '50mg'$  daily but unsure if it helps.   10/15/2020 RS: He is walking well since his hip replacement.    His memory is about the same.   He is forgetful.   He feels he has reduced word recognition.   Speech is fine.     He is not having RBD (feeling like he's being attacked) since starting clonazepam 0.5 mg.   He still hears ringing and songs and more recently birds.   His psychiatrist started quetiapine but it has not helped these.      Donepezil 5 mg was started at his last visit.      He notes having difficulty with auditory processing.   He is hard of hearing.      MRI brain 12/101/2020 showed:  Mild generalized cortical atrophy.   Minimal chronic microvascular ischemic changes, typical for age.   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 male here today for follow up for LBD. He feels that memory is fairly stable. He continues to have auditory hallucinations  every night. He is very aggravated by this. He has significant hearing loss. He is seeing audiology with [facility].   He was seen by [facility] psychiatrist yesterday. Seroquel '50mg'$  continued. Patient's wife reports psychiatrist did not understand why he was taking clonazepam. Dr Felecia Shelling suspected RBD due to reports of restless sleep, acting dreams, fighting and kicking at night.He was started on clonazepam 0.5-'1mg'$  at night and reported benefit. Wife reports that symptoms have resolved. He no longer fights in his sleep. He states that he no longer has bad dreams.   Dr Felecia Shelling discussed considering Exelon in 08/2019. He has a history of PSVT and CAD. Last saw cardiology 4 months ago. He is taking flecainide and diltiazem. No pacemaker. He has recovered well from right hip replacement in 03/2020.   HISTORY: (copied from Dr Garth Bigness note on 12/18/2019)  On Easter, he ate a lot and vomited later that night.    He had no fever.  There was a small amount of blood in the vomitus.   He also has spit up a small amount of blood -- with clearing throat or cough.   I advised him to discuss further with PCP   He feels his gait is stable  -- he uses a cane.  His memory is about the same.   He is forgetful.   He feels he has reduced word recognition.   Speech is fine.     Clonazepam was started for RBD and insomnia.    He has no taken on a regular   He sees psychiatry and ws placed on quetiapine 100 mg due to the sounds/songs playing in his ears.   He does not feel it has helped him any.       Gait issues are difficult to decipher as also has bad right hip and back (gets ESI 3 times a year)     From initial consultation: I had the pleasure seeing your patient, Michael [name], at [facility] for neurologic consultation regarding his memory loss and REM sleep disorder.   He is an 87 year old man who has had more difficulty with memory and gait over the past couple of years.  The patient is hard of hearing and his  son added a lot of details.   He notes that he has been more forgetful in the sun notes that he has had some difficulties with memory over the last year or 2.  Additionally, he seems to have a little bit more trouble figuring out some tasks.  He continues to drive and has not had any difficulty.  On recent evaluation by his primary care provider, Dr. Danise Mina, he scored 84/30 on the Mini-Mental status exam.   Additionally, he has 'ear worms' often hearing a song in his head that plays over and over --- this occurs for hours and will be one voice initially and then another voice,sometimes both.  He just hears the songs over and over again in different voices, there is no threatening aspect.    This keeps him up at nights.   He has another auditory septum.  He sometimes hears bombs or shots or thunder that makes him suddenly notice and look around.     These occur more at night but not every night and a very brief.    It is so real he has gone through the house with a gun.   For the past 10 years, he has threatening dreams where he is fighting and he kicks and strikes out.   His wife wakes him up.  They occur once or twice a month.  For insomnia, he takes trazodone .  He wakes up every hour or so to urinate.   He has no visual hallucinations.       He does not have bradykinesia.   However, he is walking worse over the past few years and son notes that Michael. Tea has more trouble the last 1-2 years.   The patient notes sciatica and knee pain.  He stoops a lot when he walks in the morning but does better during the afternoons or if he walks longer distances.     He takes small steps.   He has no falls.   He occasionally has a very mild tremor in his hands.     He has a supraventricular tachycardia and an RFA procedure was once planned.  At one point he was on a blood thinner but that has been discontinued.      I personally reviewed the MRI scan of the brain performed 03/11/2014.  This shows age-appropriate  generalized cortical atrophy and negligible chronic microvascular ischemic change.  There were no acute findings.  Laboratory test from last year were reviewed.  TSH was normal.  Vitamin D  was normal.  He has mild chronic renal insufficiency with a creatinine of 1.5.  Lipids and CBC were fine.  Vitamin B12 was normal last year.   REVIEW OF SYSTEMS: Out of a complete 14 system review of symptoms, the patient complains only of the following symptoms, memory loss, auditory hallucinations, and all other reviewed systems are negative.   ALLERGIES: No Known Allergies  HOME MEDICATIONS: Outpatient Medications Prior to Visit  Medication Sig Dispense Refill   Calcium Carb-Cholecalciferol (CALCIUM+D3 PO) Take 1 tablet by mouth daily.     camphor-menthol (SARNA) lotion APPLY SMALL AMOUNT TO AFFECTED AREA DAILY     chlorthalidone (HYGROTON) 25 MG tablet Take 1 tablet by mouth daily.     Cholecalciferol (D-3-5) 125 MCG (5000 UT) capsule Take 5,000 Units by mouth daily.     Cyanocobalamin (B-12 PO) Take by mouth daily.     cyanocobalamin 1000 MCG tablet Take by mouth.     diltiazem (CARDIZEM CD) 120 MG 24 hr capsule Take 1 capsule (120 mg total) by mouth daily. 90 capsule 3   flecainide (TAMBOCOR) 100 MG tablet TAKE 1 TABLET BY MOUTH TWICE A DAY 180 tablet 3   gabapentin (NEURONTIN) 100 MG capsule Take 100 mg by mouth daily as needed (pain).      losartan (COZAAR) 50 MG tablet Take 50 mg by mouth in the morning and at bedtime.     lovastatin (MEVACOR) 20 MG tablet TAKE 1 TABLET BY MOUTH ONCE DAILY 90 tablet 2   Melatonin 10 MG CAPS Take 1 capsule by mouth at bedtime. 30 capsule    Multiple Vitamins-Minerals (CENTRUM SILVER ADULT 50+ PO) Take 1 tablet by mouth daily. Centrum Silver     Multiple Vitamins-Minerals (ZINC PO) Take 50 mg by mouth daily.      omeprazole (PRILOSEC) 20 MG capsule TAKE 1 CAPSULE BY MOUTH EVERY DAY AS NEEDED. 90 capsule 3   potassium chloride (KLOR-CON) 10 MEQ tablet Take 1 tablet  (10 mEq total) by mouth daily. 90 tablet 3   Propylene Glycol 0.6 % SOLN INSTILL 1 DROP IN EACH EYE FOUR TIMES A DAY     QUEtiapine (SEROQUEL) 50 MG tablet Take 50 mg by mouth at bedtime.     traZODone (DESYREL) 50 MG tablet Take 0.5 tablets by mouth at bedtime.     triamcinolone cream (KENALOG) 0.1 % Apply 1 application topically daily as needed (rash).      VITAMIN A PO Take 3,000 mcg by mouth daily.     donepezil (ARICEPT) 5 MG tablet Take 1 tablet (5 mg total) by mouth at bedtime. 90 tablet 1   clonazePAM (KLONOPIN) 0.5 MG tablet TAKE 1 TABLET BY MOUTH AT BEDTIME 90 tablet 2   No facility-administered medications prior to visit.    PAST MEDICAL HISTORY: Past Medical History:  Diagnosis Date   Arthritis    Aspiration pneumonia (HCC)    BPH (benign prostatic hypertrophy)    Cellulitis of face 12/21/2019   Chronic back pain    CKD (chronic kidney disease), stage III (HCC)    Stage III kidney disease   Colon cancer ([place]) 1988   Surgery alone, no history of chemo   Diverticulosis    Essential hypertension    GERD (gastroesophageal reflux disease)    H/O calcium pyrophosphate deposition disease (CPPD)    Hiatal hernia 01/06/2015   HLD (hyperlipidemia)    HOH (hard of hearing)    bilateral hearing aids   Impotence of organic origin  Peritonsillar abscess 05/16/2019   Peritonsillar abscess determined by examination 05/15/2019   S/p abx, I&D by ENT 05/2019   PSVT (paroxysmal supraventricular tachycardia) ([place])    a. 04/2014 Event Monitor: Freq bouts of SVT with rates to 180-->seen by EP with recommendation for RFCA, pt deferred;  b. 05/2014 Echo: EF nl, mild conc LVH, no rwma, Gr1 DD, mildMR/TR.   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 Georgette Dover, MD   ROTATOR CUFF REPAIR Right 2000    [facility]   TOTAL HIP ARTHROPLASTY Right 03/24/2020   Procedure: TOTAL HIP ARTHROPLASTY ANTERIOR APPROACH;  Surgeon: Paralee Cancel, MD;  Location: [facility];  Service: Orthopedics;  Laterality: Right;  70 mins   TOTAL KNEE ARTHROPLASTY  03/05/2012   Procedure: TOTAL KNEE ARTHROPLASTY;  Surgeon: Gearlean Alf, MD;  Location: [facility];  Service: Orthopedics;  Laterality: Left;   [place] [facility], x 4   VEIN LIGATION AND STRIPPING Right     FAMILY HISTORY: Family History  Problem Relation Age of Onset   Osteoarthritis Father    Congestive Heart Failure Mother     SOCIAL HISTORY: Social History   Socioeconomic History   Marital status: Married    Spouse name: Festus Holts   Number of children: 3   Years of education: college   Highest education level: Not on file  Occupational History   Occupation: Retired    Fish farm manager: Retired  Tobacco Use   Smoking status: Never   Smokeless tobacco: Never  Vaping Use   Vaping Use: Never used  Substance and Sexual Activity   Alcohol use: Yes    Alcohol/week: 1.0 standard drink    Types: 1 Glasses of wine per week    Comment: Wine (1/2-1 glass 2x/week)   Drug use: No   Sexual activity: Yes  Other Topics Concern   Not on file  Social History Narrative   Married.  Independent with ADLs/ambulation.   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  Generalized: Well developed, in no acute distress  Cardiology: normal rate and rhythm, no murmur noted Respiratory: clear to auscultation bilaterally  Neurological examination  Mentation: Alert oriented to time, place, history taking. Follows all commands speech and language fluent Cranial  nerve II-XII: Pupils were equal round reactive to light. Extraocular movements were full, visual field were full . Motor: The motor testing reveals 5 over 5 strength of all 4 extremities. Good symmetric motor tone is noted throughout.  Sensory: Sensory testing is intact to soft touch on all 4 extremities. No evidence of extinction is noted.  Coordination: Cerebellar testing reveals good finger-nose-finger and heel-to-shin bilaterally.  Gait and station: Gait is normal. Uses cane for stability.   DIAGNOSTIC DATA (LABS, IMAGING, TESTING) - I reviewed patient records, labs, notes, testing and imaging myself where available.  MMSE - Mini Mental State Exam 04/14/2021 10/15/2020 06/18/2020  Orientation to time '3 5 3  '$ Orientation to Place '3 4 4  '$ Registration '3 3 3  '$ Attention/ Calculation 1 0 5  Recall '3 3 2  '$ Recall-comments - - -  Language- name 2 objects '2 2 2  '$ Language- repeat '1 1 1  '$ Language- follow 3 step command '3 3 2  '$ Language- follow 3 step command-comments - - -  Language- read & follow direction '1 1 1  '$ Write a sentence '1 1 1  '$ Copy design 0 0 1  Total score '21 23 25     '$ Lab Results  Component Value Date   WBC 7.4 05/07/2020   HGB 12.1 (L) 05/07/2020   HCT 36.5 (L) 05/07/2020   MCV 98.1 05/07/2020   PLT 261.0 05/07/2020      Component Value Date/Time   NA 141 05/07/2020 1010   K 4.2 05/07/2020 1010   CL 103  05/07/2020 1010   CO2 32 05/07/2020 1010   GLUCOSE 73 05/07/2020 1010   BUN 40 (H) 05/07/2020 1010   CREATININE 1.71 (H) 05/07/2020 1010   CALCIUM 9.1 05/07/2020 1010   PROT 6.3 05/07/2020 1010   ALBUMIN 3.5 05/07/2020 1010   AST 18 05/07/2020 1010   ALT 21 05/07/2020 1010   ALKPHOS 56 05/07/2020 1010   BILITOT 0.4 05/07/2020 1010   GFRNONAA 42 (L) 03/26/2020 0315   GFRAA 49 (L) 03/26/2020 0315   Lab Results  Component Value Date   CHOL 138 05/07/2020   HDL 64.30 05/07/2020   LDLCALC 54 05/07/2020   LDLDIRECT 75.4 05/03/2011   TRIG 97.0 05/07/2020   CHOLHDL 2 05/07/2020   Lab Results  Component Value Date   HGBA1C 4.9 02/19/2020   Lab Results  Component Value Date   VITAMINB12 1,420 (H) 04/16/2020   Lab Results  Component Value Date   TSH 2.20 03/29/2019       ASSESSMENT AND PLAN 87 y.o. year old male  has a past medical history of Arthritis, Aspiration pneumonia ([place]), BPH (benign prostatic hypertrophy), Cellulitis of face (12/21/2019), Chronic back pain, CKD (chronic kidney disease), stage III ([place]), Colon cancer ([place]) (1988), Diverticulosis, Essential hypertension, GERD (gastroesophageal reflux disease), H/O calcium pyrophosphate deposition disease (CPPD), Hiatal hernia (01/06/2015), HLD (hyperlipidemia), HOH (hard of hearing), Impotence of organic origin, Peritonsillar abscess (05/16/2019), Peritonsillar abscess determined by examination (05/15/2019), PSVT (paroxysmal supraventricular tachycardia) ([place]), REM sleep behavior disorder (02/21/2014), Rotator cuff rupture, SVT (supraventricular tachycardia) ([place]), Unspecified hearing loss, and Ventral hernia (1988). here with     ICD-10-CM   1. Dementia with Lewy bodies (CODE) ([place])  G31.83     2. Auditory hallucinations  R44.0     3. REM sleep behavior disorder  G47.52     4. Bilateral hearing loss, unspecified hearing loss type  H91.93        [place] is doing quite well, today. Memory is  fairly stable. MMSE 23/30. He has  continued to have auditory hallucinations, may be worsening since last visit. He also has significant hearing loss. We have discussed trial of Exelon or Aricept. He does have a significant cardiac history of PSVT and CAD on flecainide and diltiazem but has tolerated donepezil '5mg'$  daily and feels it may have helped a little. We will trial increased dose of '10mg'$  daily. He will monitor closely for any concerns of irregular heart rate, shortness of breath or chest pain. He will continue Seroquel prescribed through psychiatry. May discuss option of using risperidone. Healthy lifestyle habits encouraged. He will follow up in 3-6 months. He verbalizes understanding and agreement with this plan.    No orders of the defined types were placed in this encounter.    Meds ordered this encounter  Medications   donepezil (ARICEPT) 10 MG tablet    Sig: Take 0.5 tablets (5 mg total) by mouth at bedtime.    Dispense:  90 tablet    Refill:  3    Order Specific Question:   Supervising Provider    Answer:   Melvenia Beam [id number]       I spent 30 minutes with the patient. 50% of this time was spent counseling and educating patient on plan of care and medications.     Debbora Presto, FNP-C 04/14/2021, 4:44 PM [facility] [address] [place], [place] [address] [phone]    I have read the note, and I agree with the clinical assessment and plan.  Richard A. Felecia Shelling, MD, PhD, [facility] Certified in Neurology, Clinical Neurophysiology, Sleep Medicine, Pain Medicine and Neuroimaging  [facility] [address] [place], [place] [address] [phone]

## 2021-04-14 NOTE — Telephone Encounter (Signed)
Called the pt's wife per [name]'s request to advise that they talk with his psychiatrist about risperidone. She was appreciative for the call back and clarification.

## 2021-04-14 NOTE — Patient Instructions (Addendum)
Below is our plan:  We will increase donepezil to '10mg'$  daily. You can double up on what you are taking now and I will call in a new dose of '10mg'$  tablets to take once daily. Discuss option of changing quetiapine to risperidone. I am not certain this is an option for you but I think it is worth a conversation with psychiatry.   Please make sure you are staying well hydrated. I recommend 50-60 ounces daily. Well balanced diet and regular exercise encouraged. Consistent sleep schedule with 6-8 hours recommended.   Please continue follow up with care team as directed.   Follow up with Dr Felecia Shelling in 4-6 months   You may receive a survey regarding today's visit. I encourage you to leave honest feed back as I do use this information to improve patient care. Thank you for seeing me today!

## 2021-04-21 DIAGNOSIS — I471 Supraventricular tachycardia: Secondary | ICD-10-CM | POA: Diagnosis not present

## 2021-04-21 DIAGNOSIS — E538 Deficiency of other specified B group vitamins: Secondary | ICD-10-CM | POA: Diagnosis not present

## 2021-04-21 DIAGNOSIS — Z1389 Encounter for screening for other disorder: Secondary | ICD-10-CM | POA: Diagnosis not present

## 2021-04-21 DIAGNOSIS — Z0000 Encounter for general adult medical examination without abnormal findings: Secondary | ICD-10-CM | POA: Diagnosis not present

## 2021-04-21 DIAGNOSIS — I129 Hypertensive chronic kidney disease with stage 1 through stage 4 chronic kidney disease, or unspecified chronic kidney disease: Secondary | ICD-10-CM | POA: Diagnosis not present

## 2021-04-21 DIAGNOSIS — G4700 Insomnia, unspecified: Secondary | ICD-10-CM | POA: Diagnosis not present

## 2021-04-21 DIAGNOSIS — I2510 Atherosclerotic heart disease of native coronary artery without angina pectoris: Secondary | ICD-10-CM | POA: Diagnosis not present

## 2021-04-21 DIAGNOSIS — N183 Chronic kidney disease, stage 3 unspecified: Secondary | ICD-10-CM | POA: Diagnosis not present

## 2021-04-21 DIAGNOSIS — E785 Hyperlipidemia, unspecified: Secondary | ICD-10-CM | POA: Diagnosis not present

## 2021-04-27 DIAGNOSIS — E538 Deficiency of other specified B group vitamins: Secondary | ICD-10-CM | POA: Diagnosis not present

## 2021-04-27 DIAGNOSIS — E785 Hyperlipidemia, unspecified: Secondary | ICD-10-CM | POA: Diagnosis not present

## 2021-04-27 DIAGNOSIS — I471 Supraventricular tachycardia: Secondary | ICD-10-CM | POA: Diagnosis not present

## 2021-04-27 DIAGNOSIS — I10 Essential (primary) hypertension: Secondary | ICD-10-CM | POA: Diagnosis not present

## 2021-05-18 IMAGING — DX DG PORTABLE PELVIS
1 series · 1 of 1 positions shown · non-contrast
Comparison: None.

CLINICAL DATA: Status post right hip replacement.

EXAM:
PORTABLE PELVIS 1-2 VIEWS

[pelvis ap]
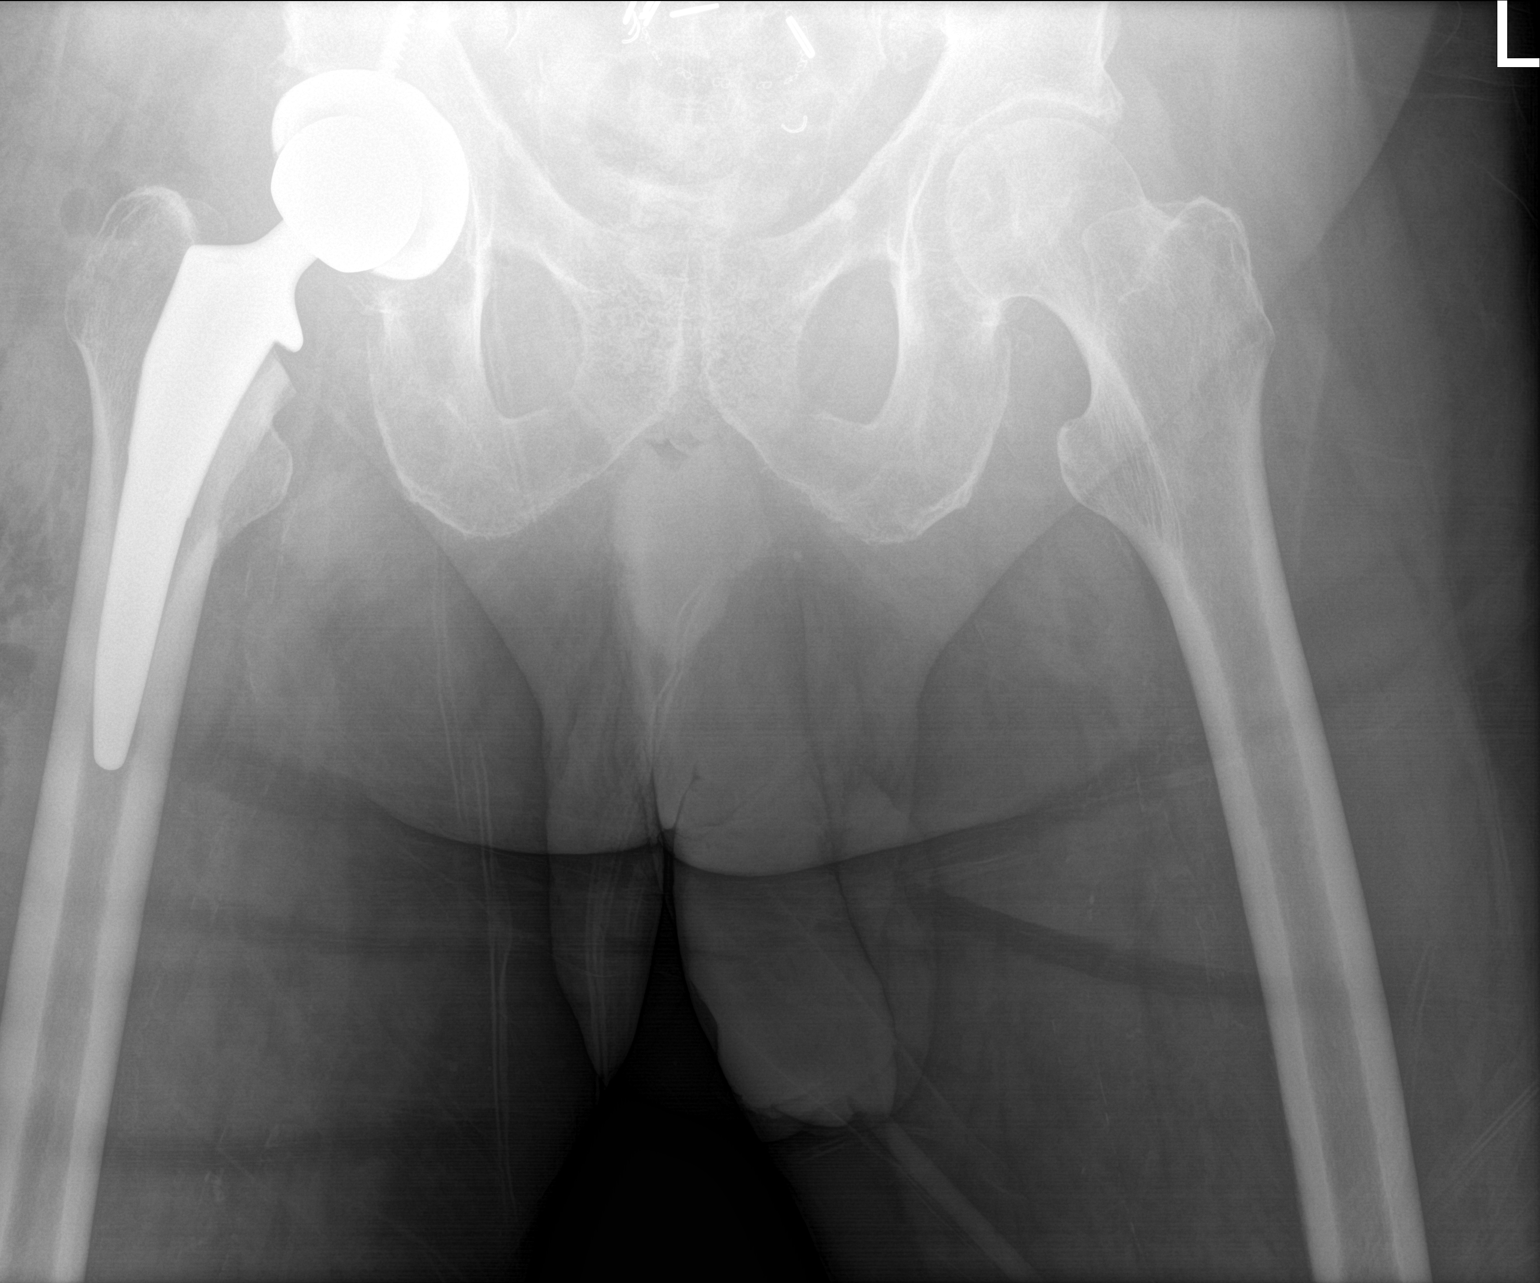

[1 of 1 positions shown; findings below may reference images not displayed]

FINDINGS: Status post right hip arthroplasty. No fracture or dislocation is
noted. The left hip is unremarkable.
IMPRESSION: Status post right hip arthroplasty.

## 2021-05-24 DIAGNOSIS — E876 Hypokalemia: Secondary | ICD-10-CM | POA: Diagnosis not present

## 2021-06-28 DIAGNOSIS — K439 Ventral hernia without obstruction or gangrene: Secondary | ICD-10-CM | POA: Diagnosis not present

## 2021-07-01 ENCOUNTER — Other Ambulatory Visit: Payer: Self-pay

## 2021-07-01 ENCOUNTER — Ambulatory Visit: Payer: Self-pay | Admitting: General Surgery

## 2021-07-01 ENCOUNTER — Ambulatory Visit
Admission: RE | Admit: 2021-07-01 | Discharge: 2021-07-01 | Disposition: A | Discharge status: Home / Self Care | Payer: Medicare Other | Source: Ambulatory Visit | Attending: General Surgery | Admitting: General Surgery

## 2021-07-01 ENCOUNTER — Other Ambulatory Visit (HOSPITAL_COMMUNITY): Payer: Self-pay | Admitting: General Surgery

## 2021-07-01 ENCOUNTER — Other Ambulatory Visit: Payer: Self-pay | Admitting: General Surgery

## 2021-07-01 ENCOUNTER — Telehealth: Payer: Self-pay | Admitting: Cardiovascular Disease

## 2021-07-01 DIAGNOSIS — N200 Calculus of kidney: Secondary | ICD-10-CM | POA: Diagnosis not present

## 2021-07-01 DIAGNOSIS — K432 Incisional hernia without obstruction or gangrene: Secondary | ICD-10-CM | POA: Diagnosis not present

## 2021-07-01 DIAGNOSIS — K7689 Other specified diseases of liver: Secondary | ICD-10-CM | POA: Diagnosis not present

## 2021-07-01 DIAGNOSIS — N281 Cyst of kidney, acquired: Secondary | ICD-10-CM | POA: Diagnosis not present

## 2021-07-01 DIAGNOSIS — K439 Ventral hernia without obstruction or gangrene: Secondary | ICD-10-CM | POA: Diagnosis not present

## 2021-07-01 NOTE — Telephone Encounter (Signed)
   [facility] Pre-operative Risk Assessment    Patient Name: Michael [name]  DOB: Sep 22, 1931 MRN: [id number]  HEARTCARE STAFF:  - IMPORTANT!!!!!! Under Visit Info/Reason for Call, type in Other and utilize the format Clearance MM/DD/YY or Clearance TBD. Do not use dashes or single digits. - Please review there is not already an duplicate clearance open for this procedure. - If request is for dental extraction, please clarify the # of teeth to be extracted. - If the patient is currently at the dentist's office, call Pre-Op Callback Staff (MA/nurse) to input urgent request.  - If the patient is not currently in the dentist office, please route to the Pre-Op pool.  Request for surgical clearance:  What type of surgery is being performed? Incisional hernia  When is this surgery scheduled? 07/14/21  What type of clearance is required (medical clearance vs. Pharmacy clearance to hold med vs. Both)? both  Are there any medications that need to be held prior to surgery and how long? Not listed, please advise if needed  Practice name and name of physician performing surgery? [facility] - Dr Peyton Najjar  What is the office phone number? [phone]   7.   What is the office fax number? [phone]  8.   Anesthesia type (None, local, MAC, general) ? Not listed    Ace Gins 07/01/2021, 4:37 PM  _________________________________________________________________   (provider comments below)

## 2021-07-01 NOTE — Telephone Encounter (Signed)
Left message to call back 07/01/2021 at 1644.  Patient needs call back.

## 2021-07-01 NOTE — H&P (Signed)
PATIENT PROFILE: Michael [name] is a 87 y.o. male who presents to the Clinic for consultation at the request of Dr. Lovie [name] for evaluation of incisional hernia.  PCP:  Michael Nancy, MD  HISTORY OF PRESENT ILLNESS: Michael [name] reports having an incisional hernia in the mid abdomen.  He endorses having significant pain when the hernia is protruding.  The pain almost made him bend over and is making it very difficult for him to reduce the hernia.  The pain is localized to the paramedical area.  No pain radiation.  Aggravating factor is increasing intra-abdominal pressure.  Alleviating factor is laying down and reducing the hernia.  There has been getting more difficult recently.  Patient is having crescendo symptoms.  Very concerning for incarceration.   PROBLEM LIST: Problem List  Never Reviewed          Noted   Hyperlipidemia 04/21/2021   Leg swelling 04/13/2020   Anemia, unspecified type 04/13/2020   Status post total hip replacement, right 03/24/2020   Dementia with Lewy bodies (CODE) (CMS-HCC) 06/07/2019   Overview    Formatting of this note might be different from the original. 05/2019: MMSE 25/30 (missed 1 orientation, 1 recall, 3 language), clock drawing 3/4, largely independent on ADLs, IADLs. Continues driving.   Last Assessment & Plan:  Formatting of this note might be different from the original. Sees neurology, mild LBD.      Coronary artery disease involving native coronary artery of native heart without angina pectoris 11/04/2016   GERD (gastroesophageal reflux disease) 10/26/2014   Overview    Last Assessment & Plan:  Formatting of this note might be different from the original. Continues omeprazole 4m daily.      PSVT (paroxysmal supraventricular tachycardia) (CMS-HCC) 09/12/2013   Overview    Formatting of this note might be different from the original. a. 04/2014 Event Monitor: Freq bouts of SVT with rates to 180-->seen by EP with recommendation for RFCA, pt  deferred.  Last Assessment & Plan:  Formatting of this note might be different from the original. On dilt and flecainide.  Not thought consistent with afib so not on anticoagulant.      Essential hypertension 01/08/2010   Overview    Last Assessment & Plan:  Formatting of this note might be different from the original. Good control on current regimen - continue.      BPH with obstruction/lower urinary tract symptoms 01/07/2010   Overview    Formatting of this note might be different from the original. flomax caused am hypotension  Last Assessment & Plan:  Formatting of this note might be different from the original. Unclear if still taking finasteride - will need to assess next visit.      CKD (chronic kidney disease) stage 3, GFR 30-59 ml/min (CMS-HCC) 07/23/2008   Overview    Last Assessment & Plan:  Formatting of this note might be different from the original. Update kidney function.          GENERAL REVIEW OF SYSTEMS:   General ROS: negative for - chills, fatigue, fever, weight gain or weight loss Allergy and Immunology ROS: negative for - hives  Hematological and Lymphatic ROS: negative for - bleeding problems or bruising, negative for palpable nodes Endocrine ROS: negative for - heat or cold intolerance, hair changes Respiratory ROS: negative for - cough, shortness of breath or wheezing Cardiovascular ROS: no chest pain or palpitations GI ROS: negative for nausea, vomiting, diarrhea, constipation.  Positive for abdominal pain Musculoskeletal  ROS: negative for - joint swelling or muscle pain Neurological ROS: negative for - confusion, syncope Dermatological ROS: negative for pruritus and rash Psychiatric: negative for anxiety, depression, difficulty sleeping and memory loss  MEDICATIONS: Current Outpatient Medications  Medication Sig Dispense Refill   betamethasone dipropionate, augmented, (DIPROLENE-AF) 0.05 % cream as needed     calcium carbonate-vitamin D3  (CALTRATE 600+D) 600 mg-10 mcg (400 unit) tablet Take 1 tablet by mouth once daily     camphor-menthoL (SARNA) lotion as needed     chlorthalidone 25 MG tablet Take 1 tablet by mouth once daily     cholecalciferol (VITAMIN D3) 5,000 unit capsule Take 5,000 Units by mouth once daily        cyanocobalamin (VITAMIN B12) 1000 MCG tablet Take 500 mcg by mouth once daily        diltiazem (TIAZAC) 120 MG ER capsule Take 120 mg by mouth once daily     donepeziL (ARICEPT) 10 MG tablet Take 10 mg by mouth at bedtime     flecainide (TAMBOCOR) 100 MG tablet Take 100 mg by mouth 2 (two) times daily        folic acid/multivit-min/lutein (CENTRUM SILVER ORAL) Take 1 tablet by mouth once daily     gabapentin (NEURONTIN) 100 MG capsule Take 100 mg by mouth once daily as needed        HYDROcodone-acetaminophen (NORCO) 7.5-325 mg tablet Take 1-2 tablets by mouth every 6 (six) hours as needed (Give 1 tablet for mild to moderate pain 1-5/10; give 2 tablets for moderate to severe pain 6/10)        lanolin/mineral oil/petrolatum (ARTIFICIAL TEARS OPHTH) Apply to eye     losartan (COZAAR) 50 MG tablet Take 1 tablet (50 mg total) by mouth 2 (two) times daily 180 tablet 1   lovastatin (MEVACOR) 20 MG tablet Take 20 mg by mouth once daily        melatonin 5 mg Tab Take 10 mg by mouth nightly as needed        mineral oil/petrolatum,white (WHITE PETROLATUM-MINERAL OIL OPHTH) as needed     omeprazole (PRILOSEC) 20 MG DR capsule TAKE 1 CAPSULE BY MOUTH DAILY AS NEEDED     potassium chloride (KLOR-CON) 10 MEQ ER tablet Take 1 tablet (10 mEq total) by mouth 2 (two) times daily 180 tablet 1   propylene glycoL (SYSTANE BALANCE) 0.6 % ophthalmic drops INSTILL ONE DROP IN EACH EYE FOUR TIMES A DAY     traZODone (DESYREL) 50 MG tablet Take 1 tablet (50 mg total) by mouth at bedtime 90 tablet 0   zinc 50 mg Tab Take 50 mg by mouth once daily     No current facility-administered medications for this visit.    ALLERGIES: Patient  has no known allergies.  PAST MEDICAL HISTORY: Past Medical History:  Diagnosis Date   Chronic kidney disease    Hypertension    Lewy body dementia (CMS-HCC)    PSVT (paroxysmal supraventricular tachycardia) (CMS-HCC)     PAST SURGICAL HISTORY: Past Surgical History:  Procedure Laterality Date   JOINT REPLACEMENT Right 10/2019   hip     FAMILY HISTORY: History reviewed. No pertinent family history.   SOCIAL HISTORY: Social History   Socioeconomic History   Marital status: Married  Tobacco Use   Smoking status: Never Smoker   Smokeless tobacco: Never Used  Substance and Sexual Activity   Alcohol use: Not Currently   Drug use: Never    PHYSICAL EXAM: Vitals:  07/01/21 1532  BP: 113/63  Pulse: 61   Body mass index is 28.24 kg/m. Weight: 88 kg (194 lb)   GENERAL: Alert, active, oriented x3  HEENT: Pupils equal reactive to light. Extraocular movements are intact. Sclera clear. Palpebral conjunctiva normal red color.Pharynx clear.  NECK: Supple with no palpable mass and no adenopathy.  LUNGS: Sound clear with no rales rhonchi or wheezes.  HEART: Regular rhythm S1 and S2 without murmur.  ABDOMEN: Soft and depressible, nontender with no palpable mass, no hepatomegaly.  Palpable nonreducible incisional hernia.  EXTREMITIES: Well-developed well-nourished symmetrical with no dependent edema.  NEUROLOGICAL: Awake alert oriented, facial expression symmetrical, moving all extremities.  REVIEW OF DATA: I have reviewed the following data today: Ancillary Orders on 05/24/2021  Component Date Value   Glucose 05/24/2021 92    Sodium 05/24/2021 140    Potassium 05/24/2021 3.4 (!)   Chloride 05/24/2021 101    Carbon Dioxide (CO2) 05/24/2021 30.5    Calcium 05/24/2021 9.3    Urea Nitrogen (BUN) 05/24/2021 39 (!)   Creatinine 05/24/2021 1.9 (!)   Glomerular Filtration Ra* 05/24/2021 34 (!)   BUN/Crea Ratio 05/24/2021 20.5 (!)   Anion Gap w/K 05/24/2021 11.9    Appointment on 04/27/2021  Component Date Value   WBC (White Blood Cell Co* 04/27/2021 6.6    RBC (Red Blood Cell Coun* 04/27/2021 3.72 (!)   Hemoglobin 04/27/2021 12.0 (!)   Hematocrit 04/27/2021 36.9 (!)   MCV (Mean Corpuscular Vo* 04/27/2021 99.2    MCH (Mean Corpuscular He* 04/27/2021 32.3 (!)   MCHC (Mean Corpuscular H* 04/27/2021 32.5    Platelet Count 04/27/2021 275    RDW-CV (Red Cell Distrib* 04/27/2021 12.4    MPV (Mean Platelet Volum* 04/27/2021 10.3    Neutrophils 04/27/2021 4.60    Lymphocytes 04/27/2021 1.30    Mixed Count 04/27/2021 0.70    Neutrophil % 04/27/2021 71.0 (!)   Lymphocyte % 04/27/2021 19.0    Mixed % 04/27/2021 10.0    Glucose 04/27/2021 85    Sodium 04/27/2021 141    Potassium 04/27/2021 3.3 (!)   Chloride 04/27/2021 101    Carbon Dioxide (CO2) 04/27/2021 35.6 (!)   Urea Nitrogen (BUN) 04/27/2021 30 (!)   Creatinine 04/27/2021 1.6 (!)   Glomerular Filtration Ra* 04/27/2021 41 (!)   Calcium 04/27/2021 9.0    AST  04/27/2021 22    ALT  04/27/2021 17    Alk Phos (alkaline Phosp* 04/27/2021 57    Albumin 04/27/2021 3.7    Bilirubin, Total 04/27/2021 0.4    Protein, Total 04/27/2021 6.6    A/G Ratio 04/27/2021 1.3    Cholesterol, Total 04/27/2021 157    Triglyceride 04/27/2021 102    HDL (High Density Lipopr* 04/27/2021 61.8    LDL Calculated 04/27/2021 75    VLDL Cholesterol 04/27/2021 20    Cholesterol/HDL Ratio 04/27/2021 2.5    Thyroid Stimulating Horm* 04/27/2021 1.709    Vitamin B12 04/27/2021 979    Color 04/27/2021 Yellow    Clarity 04/27/2021 Clear    Specific Gravity 04/27/2021 1.010    pH, Urine 04/27/2021 7.0    Protein, Urinalysis 04/27/2021 Trace    Glucose, Urinalysis 04/27/2021 Negative    Ketones, Urinalysis 04/27/2021 Negative    Blood, Urinalysis 04/27/2021 Negative    Nitrite, Urinalysis 04/27/2021 Negative    Leukocyte Esterase, Urin* 04/27/2021 Negative    White Blood Cells, Urina* 04/27/2021 None Seen    Red Blood  Cells, Urinaly* 04/27/2021 None Seen  Bacteria, Urinalysis 04/27/2021 None Seen    Squamous Epithelial Cell* 04/27/2021 Rare      ASSESSMENT: Mr. Pennypacker is a 87 y.o. male presenting for consultation for incarcerated incisional hernia.    The patient presents with a symptomatic incarcerated incisional hernia. Patient was oriented about the diagnosis of incisional hernia and its implication. The patient was oriented about the treatment alternatives (observation vs surgical repair). Due to patient symptoms, repair is recommended. Patient oriented about the surgical procedure, the use of mesh and its risk of complications such as: infection, bleeding, injury to vasculature, injury to bowel or bladder, and chronic pain, intestinal obstruction, among others.   Due to previous incisional hernia repair with mesh in the past and the fact that patient has significant pain I recommend to proceed with CT scan of the abdomen appropriate for complete evaluation of complex incisional hernia.  With a CT scan will have complete information of the amount of hernias, the size that would help with surgical planning and mesh selection.  This can also have between open versus laparoscopic procedure.  Incisional hernia, without obstruction or gangrene [K43.2]  PLAN: 1.  CT scan of the abdomen and pelvis for evaluation of complex incisional hernia and severe abdominal pain 2.  With CT scan results we will call patient to discuss possible robotic assisted laparoscopic incisional hernia repair with mesh ([id number]) 3.  Cardiac clearance 4.  CBC done 04/27/2021, CMP done 05/24/2021 5.  Avoid taking aspirin 5 days before the surgery 4.  Contact us if you have any concern  Patient and his wife verbalized understanding, all questions were answered, and were agreeable with the plan outlined above.

## 2021-07-02 NOTE — Telephone Encounter (Signed)
   Primary Cardiologist: Ida Rogue, MD  Chart reviewed as part of pre-operative protocol coverage. Given past medical history and time since last visit, based on ACC/AHA guidelines, Michael [name] would be at acceptable risk for the planned procedure without further cardiovascular testing.   His RCRI is a class IV risk, 11% risk of major cardiac event.  He is able to complete greater than 4 METS of physical activity.  Patient was advised that if he develops new symptoms prior to surgery to contact our office to arrange a follow-up appointment.  He verbalized understanding.  I will route this recommendation to the requesting party via Epic fax function and remove from pre-op pool.  Please call with questions.  Jossie Ng. Mirl Hillery NP-C    07/02/2021, 10:32 AM [place] [address] Office [phone] Fax [phone]

## 2021-07-07 ENCOUNTER — Encounter
Admission: RE | Admit: 2021-07-07 | Discharge: 2021-07-07 | Disposition: A | Discharge status: Home / Self Care | Payer: Medicare Other | Source: Ambulatory Visit | Attending: General Surgery | Admitting: General Surgery

## 2021-07-07 ENCOUNTER — Other Ambulatory Visit: Payer: Self-pay

## 2021-07-07 DIAGNOSIS — N1832 Chronic kidney disease, stage 3b: Secondary | ICD-10-CM

## 2021-07-07 HISTORY — DX: Personal history of urinary calculi: Z87.442

## 2021-07-07 HISTORY — DX: Anemia, unspecified: D64.9

## 2021-07-07 HISTORY — DX: Hypokalemia: E87.6

## 2021-07-07 NOTE — Patient Instructions (Signed)
Your procedure is scheduled on:07-14-21 Wednesday Report to the Registration Desk on the 1st floor of the [facility].Then proceed to the 2nd floor Surgery Desk in the [facility] To find out your arrival time, please call [phone] between 1PM - 3PM on:07-13-21 Tuesday  REMEMBER: Instructions that are not followed completely may result in serious medical risk, up to and including death; or upon the discretion of your surgeon and anesthesiologist your surgery may need to be rescheduled.  Do not eat food after midnight the night before surgery.  No gum chewing, lozengers or hard candies.  You may however, drink CLEAR liquids up to 2 hours before you are scheduled to arrive for your surgery. Do not drink anything within 2 hours of your scheduled arrival time.  Clear liquids include: - water  - apple juice without pulp - gatorade (not RED, PURPLE, OR BLUE) - black coffee or tea (Do NOT add milk or creamers to the coffee or tea) Do NOT drink anything that is not on this list.  TAKE THESE MEDICATIONS THE MORNING OF SURGERY WITH A SIP OF WATER: -diltiazem (CARDIZEM CD) 120 MG 24 hr capsule -flecainide (TAMBOCOR) 100 MG tablet -omeprazole (PRILOSEC) 20 MG capsule-take one the night before and one on the morning of surgery - helps to prevent nausea after surgery.)  One week prior to surgery: Stop Anti-inflammatories (NSAIDS) such as Advil, Aleve, Ibuprofen, Motrin, Naproxen, Naprosyn and Aspirin based products such as Excedrin, Goodys Powder, BC Powder.You may however, continue to take Tylenol if needed for pain up until the day of surgery.  Stop ANY OVER THE COUNTER supplements/vitamins NOW (07-07-21) until after surgery (Multiple Vitamins-Minerals (CENTRUM SILVER ADULT 50+ PO), cholecalciferol (VITAMIN D3) 25 MCG (1000 UNIT) tablet, Vitamin A 3 MG (10000 UT) TABS, vitamin B-12 (CYANOCOBALAMIN) 1000 MCG tablet, zinc gluconate 50 MG tablet)-Continue your potassium chloride (KLOR-CON) 10 MEQ  tablet and Melatonin 5 MG CAPS up until the day prior to surgery  No Alcohol for 24 hours before or after surgery.  No Smoking including e-cigarettes for 24 hours prior to surgery.  No chewable tobacco products for at least 6 hours prior to surgery.  No nicotine patches on the day of surgery.  Do not use any "recreational" drugs for at least a week prior to your surgery.  Please be advised that the combination of cocaine and anesthesia may have negative outcomes, up to and including death. If you test positive for cocaine, your surgery will be cancelled.  On the morning of surgery brush your teeth with toothpaste and water, you may rinse your mouth with mouthwash if you wish. Do not swallow any toothpaste or mouthwash.  Use CHG Soap as directed on instruction sheet.  Do not wear jewelry, make-up, hairpins, clips or nail polish.  Do not wear lotions, powders, or perfumes.   Do not shave body from the neck down 48 hours prior to surgery just in case you cut yourself which could leave a site for infection.  Also, freshly shaved skin may become irritated if using the CHG soap.  Contact lenses, hearing aids and dentures may not be worn into surgery.  Do not bring valuables to the hospital. [facility] is not responsible for any missing/lost belongings or valuables.   Notify your doctor if there is any change in your medical condition (cold, fever, infection).  Wear comfortable clothing (specific to your surgery type) to the hospital.  After surgery, you can help prevent lung complications by doing breathing exercises.  Take  deep breaths and cough every 1-2 hours. Your doctor may order a device called an Incentive Spirometer to help you take deep breaths. When coughing or sneezing, hold a pillow firmly against your incision with both hands. This is called "splinting." Doing this helps protect your incision. It also decreases belly discomfort.  If you are being admitted to the hospital  overnight, leave your suitcase in the car. After surgery it may be brought to your room.  If you are being discharged the day of surgery, you will not be allowed to drive home. You will need a responsible adult (18 years or older) to drive you home and stay with you that night.   If you are taking public transportation, you will need to have a responsible adult (18 years or older) with you. Please confirm with your physician that it is acceptable to use public transportation.   Please call the [facility] at [phone] if you have any questions about these instructions.  Surgery Visitation Policy:  Patients undergoing a surgery or procedure may have one family member or support person with them as long as that person is not COVID-19 positive or experiencing its symptoms.  That person may remain in the waiting area during the procedure and may rotate out with other people.  Inpatient Visitation:    Visiting hours are 7 a.m. to 8 p.m. Up to two visitors ages 16+ are allowed at one time in a patient room. The visitors may rotate out with other people during the day. Visitors must check out when they leave, or other visitors will not be allowed. One designated support person may remain overnight. The visitor must pass COVID-19 screenings, use hand sanitizer when entering and exiting the patient's room and wear a mask at all times, including in the patient's room. Patients must also wear a mask when staff or their visitor are in the room. Masking is required regardless of vaccination status.

## 2021-07-08 ENCOUNTER — Encounter: Payer: Self-pay | Admitting: Urgent Care

## 2021-07-08 ENCOUNTER — Encounter
Admission: RE | Admit: 2021-07-08 | Discharge: 2021-07-08 | Disposition: A | Discharge status: Home / Self Care | Payer: Medicare Other | Source: Ambulatory Visit | Attending: General Surgery | Admitting: General Surgery

## 2021-07-08 ENCOUNTER — Encounter: Payer: Self-pay | Admitting: General Surgery

## 2021-07-08 DIAGNOSIS — N1832 Chronic kidney disease, stage 3b: Secondary | ICD-10-CM | POA: Insufficient documentation

## 2021-07-08 HISTORY — DX: Dementia in other diseases classified elsewhere, unspecified severity, without behavioral disturbance, psychotic disturbance, mood disturbance, and anxiety: F02.80

## 2021-07-08 LAB — POTASSIUM: Potassium: 3.1 mmol/L — ABNORMAL LOW (ref 3.5–5.1)

## 2021-07-08 NOTE — Progress Notes (Signed)
  [facility] Perioperative Services: Pre-Admission/Anesthesia Testing  Abnormal Lab Notification   Date: 07/08/21  Name: Michael [name] MRN:   [id number]  Re: Abnormal labs noted during PAT appointment   Notified:  Provider Name Provider Role Notification Mode  Herbert Pun, MD General Surgery Routed and/or faxed via [facility] and Notes:  ABNORMAL LAB VALUE(S): Lab Results  Component Value Date   K 3.1 (L) 07/08/2021   Michael [name] is scheduled for a XI ROBOTIC ASSISTED VENTRAL HERNIA (Abdomen) on 07/14/2021. In review of his medication list, it is noted that patient is on daily chlorthalidone 25 mg dose. With that being said, patient is also taking oral Klor-Con 10 mEq PO BID. In order for patient to safely proceed with planned surgical/anesthetic course, patient will need to have a minimum K+ of 3.0 mmol/L. Sending result to primary attending surgeon for review and consideration of further documentation.  Order placed for K+ to be rechecked on the day of patient's procedure to ensure optimization.  This is a Community education officer; no formal response is required.  Honor Loh, MSN, APRN, FNP-C, CEN [facility]  Peri-operative Services Nurse Practitioner Phone: [phone] Fax: [phone] 07/08/21 12:35 PM

## 2021-07-08 NOTE — Progress Notes (Signed)
Perioperative Services  Pre-Admission/Anesthesia Testing Clinical Review  Date: 07/08/21  Patient Demographics:  Name: Michael [name] DOB:   1932-06-25 MRN:   [id number]  Planned Surgical Procedure(s):    Case: [id number] Date/Time: 07/14/21 1038   Procedure: XI ROBOTIC ASSISTED VENTRAL HERNIA (Abdomen)   Anesthesia type: General   Pre-op diagnosis: K43.2 incisional hernia w/o obstruction or gangrene   Location: [facility] OR ROOM 06 / [facility]   Surgeons: Herbert Pun, MD   NOTE: Available PAT nursing documentation and vital signs have been reviewed. Clinical nursing staff has updated patient's PMH/PSHx, current medication list, and drug allergies/intolerances to ensure comprehensive history available to assist in medical decision making as it pertains to the aforementioned surgical procedure and anticipated anesthetic course. Extensive review of available clinical information performed.  PMH and PSHx updated with any diagnoses/procedures that  may have been inadvertently omitted during his intake with the pre-admission testing department's nursing staff.  Clinical Discussion:  Michael [name] is a 87 y.o. male who is submitted for pre-surgical anesthesia review and clearance prior to him undergoing the above procedure. Patient has never been a smoker. Pertinent PMH includes: CAD, PSVT, diastolic dysfunction, 1st degree AVB, HTN, aortic atherosclerosis, CKD-III, GERD (on daily PPI), hiatal hernia, anemia, BPH, OA, Lewy body dementia, REM sleep behavior disorder, HOH (requires hearing aides).   Patient is followed by cardiology Rockey Situ, MD). He was last seen in the cardiology clinic on 01/12/2021; notes reviewed. At the time of his clinic visit, the patient denied any chest pain, shortness of breath, PND, orthopnea, palpitations, or presyncope/syncope. Patient with intermittent vertiginous symptoms and chronic peripheral edema. Patient with a PMH significant for  cardiovascular diagnoses.  Long-term cardiac event monitor on in 04/2014 revealed frequent episodes of SVT.  He was referred to electrophysiology with plan to RCA, however patient ultimately declined the procedure. In the absence of the recommended cardiac ablation, patient has done well overall from a cardiac perspective.   Functional studies have been trended on this patient. TTE performed on 05/23/2014 revealed normal left ventricular systolic function with mild concentric LVH. LVEF 63%. Doppler parameters consistent with abnormal left ventricular relaxation (G1DD). There was trivial tricuspid and mild mitral valve regurgitation. Study revealed no evidence of a significant transvalvular gradient suggestive of stenosis.    TTE on  10/27/2014 demonstrated an LVEF of 61% the left atrium was mildly dilated.  Doppler parameters consistent with pseudonormalization (G2DD).   TTE on 04/10/2020 revealed a normal left ventricular systolic function with mild LVH; EF >55%. GLS -20.3%. There was trivial panvalvular regurgitation. No evidence of a significant transvalvular gradient suggestive of stenosis    Myocardial perfusion imaging study performed on 05/08/2017 revealed an LVEF of 55-65%. There was no ST segment deviation noted during stress. Of note, there was a small mild apical perfusion defect noted, however defect is fixed and has normal wall motion making noted defect more consistent with artifact as opposed to ischemia.   PSVT is managed with oral diltiazem + flecainide.  Blood pressure was well controlled at 120/80 on arrival diuretic, CCB, and ARB therapy.  Patient is on a statin for his HLD.  He is not diabetic. Functional capacity, as defined by DASI, is documented as being >/= 4 METS. No changes were made to patient's medication regimen. Patient to follow up with outpatient cardiology in 1 year or sooner if needed.  Michael [name] is scheduled for an ROBOT ASSISTED VENTRAL HERNIA on 07/14/2021  with Dr. Reeves Forth  Windell Moment, MD. Given patient's past medical history significant for cardiovascular diagnoses, presurgical cardiac clearance was sought by the performing surgeon's office and PAT team. Per cardiology, "based ACC/AHA guidelines, the patient's past medical history, and the amount of time since his last clinic visit, this patient would be at an overall ACCEPTABLE risk for the planned procedure without further cardiovascular testing or intervention at this time". This patient is not currently taking any type of anticoagulation/antiplatelet therapies that would need to be held perioperatively.   Patient denies previous perioperative complications with anesthesia in the past. In review of the available records, it is noted that patient underwent a neuraxial anesthetic course at [facility] in 03/2020 without documented complications.   Vitals with BMI 04/14/2021 01/12/2021 10/15/2020  Height _0  _1  _2   Weight 191 lbs 8 oz 200 lbs 206 lbs  BMI 28.27 57.26 20.35  Systolic 597 416 384  Diastolic 70 80 86  Pulse 67 58 62    Providers/Specialists:   NOTE: Primary physician provider listed below. Patient may have been seen by APP or partner within same practice.   PROVIDER ROLE / SPECIALTY LAST Tanna Savoy, MD GENERAL SURGERY (SURGEON) 07/01/2021  Juluis Pitch, MD PRIMARY CARE PROVIDER 06/28/2021  Ida Rogue, MD CARDIOLOGY 01/12/2021  Christell Constant, MD NEUROLOGY 04/14/2021   Allergies:  Patient has no known allergies.  Current Home Medications:   No current facility-administered medications for this encounter.    acetaminophen (TYLENOL) 325 MG tablet   camphor-menthol (SARNA) lotion   chlorthalidone (HYGROTON) 25 MG tablet   cholecalciferol (VITAMIN D3) 25 MCG (1000 UNIT) tablet   diltiazem (CARDIZEM CD) 120 MG 24 hr capsule   donepezil (ARICEPT) 10 MG tablet   flecainide (TAMBOCOR) 100 MG tablet   losartan (COZAAR) 50 MG tablet    lovastatin (MEVACOR) 20 MG tablet   Melatonin 5 MG CAPS   Multiple Vitamins-Minerals (CENTRUM SILVER ADULT 50+ PO)   omeprazole (PRILOSEC) 20 MG capsule   Polyethyl Glycol-Propyl Glycol (SYSTANE OP)   potassium chloride (KLOR-CON) 10 MEQ tablet   traZODone (DESYREL) 50 MG tablet   Vitamin A 3 MG (10000 UT) TABS   vitamin B-12 (CYANOCOBALAMIN) 1000 MCG tablet   zinc gluconate 50 MG tablet   History:   Past Medical History:  Diagnosis Date   Anemia    Aortic atherosclerosis (HCC)    Arthritis    Aspiration pneumonia ([place]) 2020   BPH (benign prostatic hypertrophy)    CAD (coronary artery disease) 05/23/2016   a.) CT 05/23/2016: extensive 3v CAD.   Cellulitis of face 12/21/2019   Chronic back pain    CKD (chronic kidney disease), stage III (HCC)    Colon cancer ([place]) 1988   Surgery alone, no history of chemo   Diastolic dysfunction 53/64/6803   a.) TTE 05/23/2014; EF 63%; G1DD. b.) TTE 10/27/2014: EF 61%, mild LA dilation; G2DD. c.) TTE 04/10/2020: EF >55%. GLS -20.3%. Triv panvalvular regurgitation; no stenosis.   Diverticulosis    Essential hypertension    First degree AV block    GERD (gastroesophageal reflux disease)    H/O calcium pyrophosphate deposition disease (CPPD)    Hiatal hernia 01/06/2015   History of kidney stones    HLD (hyperlipidemia)    HOH (hard of hearing)    bilateral hearing aids   Hypokalemia    Impotence of organic origin    Lewy body dementia ([place])    Peritonsillar abscess determined by examination 05/15/2019  S/p abx, I&D by ENT 05/2019   PSVT (paroxysmal supraventricular tachycardia) ([place])    a. 04/2014 Event Monitor: Freq bouts of SVT with rates to 180-->seen by EP with recommendation for RFCA, pt deferred;  b. 05/2014 Echo: EF nl, mild conc LVH, no rwma, Gr1 DD, mildMR/TR.   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   Rotator cuff rupture    Right; s/p repair   Unspecified hearing loss    Ventral hernia 1988   Past Surgical History:   Procedure Laterality Date   CATARACT EXTRACTION W/ INTRAOCULAR LENS IMPLANT Bilateral    COLONOSCOPY     HEMICOLOECTOMY W/ ANASTOMOSIS  1988   [name]   INGUINAL HERNIA REPAIR Right 1963   MASTECTOMY, PARTIAL Left 02/07/2013   LEFT SUBCUTANEOUS MASTECTOMY; Imogene Burn. Georgette Dover, MD   ROTATOR CUFF REPAIR Right 2000    [facility]   TOTAL HIP ARTHROPLASTY Right 03/24/2020   Procedure: TOTAL HIP ARTHROPLASTY ANTERIOR APPROACH;  Surgeon: Paralee Cancel, MD;  Location: [facility] ORS;  Service: Orthopedics;  Laterality: Right;  70 mins   TOTAL KNEE ARTHROPLASTY  03/05/2012   Procedure: TOTAL KNEE ARTHROPLASTY;  Surgeon: Gearlean Alf, MD;  Location: [facility] ORS;  Service: Orthopedics;  Laterality: Left;   [place] [facility], x 4   VEIN LIGATION AND STRIPPING Right    Family History  Problem Relation Age of Onset   Osteoarthritis Father    Congestive Heart Failure Mother    Social History   Tobacco Use   Smoking status: Never   Smokeless tobacco: Never  Vaping Use   Vaping Use: Never used  Substance Use Topics   Alcohol use: Yes    Alcohol/week: 1.0 standard drink    Types: 1 Glasses of wine per week    Comment: Wine (1/2-1 glass 2x/week)   Drug use: No    Pertinent Clinical Results:  LABS:  Hospital Outpatient Visit on 07/08/2021  Component Date Value Ref Range Status   Potassium 07/08/2021 3.1 (A)  3.5 - 5.1 mmol/L Final   Performed at [facility], [address] [place], [place] [address]    Ref Range & Units 05/24/2021  Glucose 70 - 110 mg/dL 92   Sodium 136 - 145 mmol/L 140   Potassium 3.6 - 5.1 mmol/L 3.4 Low    Chloride 97 - 109 mmol/L 101   Carbon Dioxide (CO2) 22.0 - 32.0 mmol/L 30.5   Calcium 8.7 - 10.3 mg/dL 9.3   Urea Nitrogen (BUN) 7 - 25 mg/dL 39 High    Creatinine 0.7 - 1.3 mg/dL 1.9 High    Glomerular Filtration Rate (eGFR), MDRD Estimate >60 mL/min/1.73sq m 34 Low    BUN/Crea Ratio 6.0 - 20.0 20.5 High    Anion Gap  w/K 6.0 - 16.0 11.9   Resulting Agency  [facility] - [facility]  Specimen Collected: 05/24/21 11:07 Last Resulted: 05/24/21 16:51  Received From: [facility]  Result Received: 06/29/21 16:07    Ref Range & Units 04/27/2021  WBC (White Blood Cell Count) 4.1 - 10.2 10^3/uL 6.6   RBC (Red Blood Cell Count) 4.69 - 6.13 10^6/uL 3.72 Low    Hemoglobin 14.1 - 18.1 gm/dL 12.0 Low    Hematocrit 40.0 - 52.0 % 36.9 Low    MCV (Mean Corpuscular Volume) 80.0 - 100.0 fl 99.2   MCH (Mean Corpuscular Hemoglobin) 27.0 - 31.2 pg 32.3 High    MCHC (Mean Corpuscular Hemoglobin Concentration) 32.0 - 36.0  gm/dL 32.5   Platelet Count 150 - 450 10^3/uL 275   RDW-CV (Red Cell Distribution Width) 11.6 - 14.8 % 12.4   MPV (Mean Platelet Volume) 9.4 - 12.4 fl 10.3   Neutrophils 1.50 - 7.80 10^3/uL 4.60   Lymphocytes 1.00 - 3.60 10^3/uL 1.30   Mixed Count 0.10 - 0.90 10^3/uL 0.70   Neutrophil % 32.0 - 70.0 % 71.0 High    Lymphocyte % 10.0 - 50.0 % 19.0   Mixed % 3.0 - 14.4 % 10.0   Resulting Agency  [facility] - [facility]  Specimen Collected: 04/27/21 11:44 Last Resulted: 04/27/21 12:30  Received From: [facility]  Result Received: 06/29/21 16:07    ECG: Date: 01/12/2021 Time ECG obtained: 1454 PM Rate: 58 bpm Rhythm:  Sinus bradycardia with first-degree AV block Axis (leads I and aVF): Normal Intervals: PR 230 ms. QRS 114 ms. QTc 602 ms. ST segment and T wave changes: No evidence of acute ST segment elevation or depression Comparison: Similar to previous tracing obtained on 12/24/2019   IMAGING / PROCEDURES: TRANSTHORACIC ECHOCARDIOGRAM performed on 04/10/2020 LVEF >55% Normal left ventricular systolic function with mild LVH Normal right ventricular systolic function Trivial pan valvular regurgitation No evidence of valvular stenosis Every global longitudinal strain -20.3% No pericardial effusion  LEXISCAN performed on 05/08/2016 LVEF normal at 55 to  65% Normal left ventricular systolic function No ST segment deviation or T wave inversion noted during stress There is a small defect of mild severity present in the apex location.  The defect fixed and has normal wall motion, thus most likely associated with artifact Normal low risk study   Impression and Plan:  Michael [name] has been referred for pre-anesthesia review and clearance prior to him undergoing the planned anesthetic and procedural courses. Available labs, pertinent testing, and imaging results were personally reviewed by me. This patient has been appropriately cleared by cardiology with an overall ACCEPTABLE risk of significant perioperative cardiovascular complications.  Based on clinical review performed today (07/08/21), barring any significant acute changes in the patient's overall condition, it is anticipated that he will be able to proceed with the planned surgical intervention. Any acute changes in clinical condition may necessitate his procedure being postponed and/or cancelled. Patient will meet with anesthesia team (MD and/or CRNA) on the day of his procedure for preoperative evaluation/assessment. Questions regarding anesthetic course will be fielded at that time.   Pre-surgical instructions were reviewed with the patient during his PAT appointment and questions were fielded by PAT clinical staff. Patient was advised that if any questions or concerns arise prior to his procedure then he should return a call to PAT and/or his surgeon's office to discuss.  Honor Loh, MSN, APRN, FNP-C, CEN [facility]  Peri-operative Services Nurse Practitioner Phone: [phone] Fax: [phone] 07/08/21 2:46 PM  NOTE: This note has been prepared using Dragon dictation software. Despite my best ability to proofread, there is always the potential that unintentional transcriptional errors may still occur from this process.

## 2021-07-09 ENCOUNTER — Emergency Department
Admission: EM | Admit: 2021-07-09 | Discharge: 2021-07-09 | Disposition: A | Discharge status: Home / Self Care | Payer: Medicare Other | Attending: Emergency Medicine | Admitting: Emergency Medicine

## 2021-07-09 ENCOUNTER — Encounter: Payer: Self-pay | Admitting: Emergency Medicine

## 2021-07-09 ENCOUNTER — Other Ambulatory Visit: Payer: Self-pay

## 2021-07-09 ENCOUNTER — Emergency Department: Payer: Medicare Other

## 2021-07-09 DIAGNOSIS — M25561 Pain in right knee: Secondary | ICD-10-CM | POA: Diagnosis not present

## 2021-07-09 DIAGNOSIS — I2510 Atherosclerotic heart disease of native coronary artery without angina pectoris: Secondary | ICD-10-CM | POA: Diagnosis not present

## 2021-07-09 DIAGNOSIS — N183 Chronic kidney disease, stage 3 unspecified: Secondary | ICD-10-CM | POA: Diagnosis not present

## 2021-07-09 DIAGNOSIS — M25469 Effusion, unspecified knee: Secondary | ICD-10-CM

## 2021-07-09 DIAGNOSIS — M25461 Effusion, right knee: Secondary | ICD-10-CM | POA: Insufficient documentation

## 2021-07-09 DIAGNOSIS — Z85038 Personal history of other malignant neoplasm of large intestine: Secondary | ICD-10-CM | POA: Diagnosis not present

## 2021-07-09 DIAGNOSIS — I129 Hypertensive chronic kidney disease with stage 1 through stage 4 chronic kidney disease, or unspecified chronic kidney disease: Secondary | ICD-10-CM | POA: Insufficient documentation

## 2021-07-09 DIAGNOSIS — Z96652 Presence of left artificial knee joint: Secondary | ICD-10-CM | POA: Diagnosis not present

## 2021-07-09 DIAGNOSIS — Z79899 Other long term (current) drug therapy: Secondary | ICD-10-CM | POA: Diagnosis not present

## 2021-07-09 DIAGNOSIS — Z96641 Presence of right artificial hip joint: Secondary | ICD-10-CM | POA: Insufficient documentation

## 2021-07-09 DIAGNOSIS — D631 Anemia in chronic kidney disease: Secondary | ICD-10-CM | POA: Insufficient documentation

## 2021-07-09 DIAGNOSIS — W182XXA Fall in (into) shower or empty bathtub, initial encounter: Secondary | ICD-10-CM | POA: Insufficient documentation

## 2021-07-09 DIAGNOSIS — F0390 Unspecified dementia without behavioral disturbance: Secondary | ICD-10-CM | POA: Insufficient documentation

## 2021-07-09 MED ORDER — OXYCODONE HCL 5 MG PO TABS: 5.0000 mg | ORAL_TABLET | Freq: Two times a day (BID) | ORAL | 0 refills | Status: DC | PRN

## 2021-07-09 MED ORDER — OXYCODONE HCL 5 MG PO TABS
5.0000 mg | ORAL_TABLET | Freq: Once | ORAL | Status: AC
Start: 2021-07-09 — End: 2021-07-09
  Administered 2021-07-09: 5 mg via ORAL
  Filled 2021-07-09: qty 1

## 2021-07-09 MED ORDER — LIDOCAINE HCL (PF) 1 % IJ SOLN
5.0000 mL | Freq: Once | INTRAMUSCULAR | Status: AC
  Administered 2021-07-09: 5 mL via INTRADERMAL
  Filled 2021-07-09: qty 5

## 2021-07-09 NOTE — ED Provider Notes (Signed)
Emergency Medicine Provider Triage Evaluation Note  Michael [name] , a 87 y.o. male  was evaluated in triage.  Pt complains of right knee pain after falling out of the shower yesterday. He did not strike his head or lose consciousness. Pain wasn't too bad yesterday, but has worsened today and has had great difficulty ambulating even with walker.  Review of Systems  Positive: Right knee pain Negative: Headache, neck/back pain  Physical Exam  Ht 5\' 9"  (1.753 m)   Wt 86.9 kg   BMI 28.29 kg/m  Gen:   Awake, no distress   Resp:  Normal effort  MSK:   Moves extremities without difficulty  Other:    Medical Decision Making  Medically screening exam initiated at 4:56 PM.  Appropriate orders placed.  Michael [name] was informed that the remainder of the evaluation will be completed by another provider, this initial triage assessment does not replace that evaluation, and the importance of remaining in the ED until their evaluation is complete.   Victorino Dike, FNP 07/09/21 1700    Rada Hay, MD 07/09/21 2013

## 2021-07-09 NOTE — ED Triage Notes (Signed)
Mechanical fall today, stepping out of the shower, fell onto right knee. C/O pain.

## 2021-07-09 NOTE — ED Provider Notes (Signed)
[facility] Emergency Department Provider Note ____________________________________________  Time seen: Approximately 8:19 PM  I have reviewed the triage vital signs and the nursing notes.   HISTORY  Chief Complaint Knee Injury    HPI Michael [name] is a 87 y.o. male who presents to the emergency department for evaluation and treatment of right knee pain.  He had a mechanical, nonsyncopal fall yesterday when stepping out of the shower and fell onto his right knee.  Pain has continued to worsen and knee has continued to swell.  Patient states that it is extremely painful to attempt to bear weight.  Past Medical History:  Diagnosis Date   Anemia    Aortic atherosclerosis (HCC)    Arthritis    Aspiration pneumonia ([place]) 2020   BPH (benign prostatic hypertrophy)    CAD (coronary artery disease) 05/23/2016   a.) CT 05/23/2016: extensive 3v CAD.   Cellulitis of face 12/21/2019   Chronic back pain    CKD (chronic kidney disease), stage III (HCC)    Colon cancer ([place]) 1988   Surgery alone, no history of chemo   Diastolic dysfunction 85/46/2703   a.) TTE 05/23/2014; EF 63%; G1DD. b.) TTE 10/27/2014: EF 61%, mild LA dilation; G2DD. c.) TTE 04/10/2020: EF >55%. GLS -20.3%. Triv panvalvular regurgitation; no stenosis.   Diverticulosis    Essential hypertension    First degree AV block    GERD (gastroesophageal reflux disease)    H/O calcium pyrophosphate deposition disease (CPPD)    Hiatal hernia 01/06/2015   History of kidney stones    HLD (hyperlipidemia)    HOH (hard of hearing)    bilateral hearing aids   Hypokalemia    Impotence of organic origin    Lewy body dementia ([place])    Peritonsillar abscess determined by examination 05/15/2019   S/p abx, I&D by ENT 05/2019   PSVT (paroxysmal supraventricular tachycardia) ([place])    a. 04/2014 Event Monitor: Freq bouts of SVT with rates to 180-->seen by EP with recommendation for RFCA, pt deferred;  b. 05/2014 Echo:  EF nl, mild conc LVH, no rwma, Gr1 DD, mildMR/TR.   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   Rotator cuff rupture    Right; s/p repair   Unspecified hearing loss    Ventral hernia 1988    Patient Active Problem List   Diagnosis Date Noted   Memory deficit 10/15/2020   Right shoulder pain 05/14/2020   Pedal edema 04/16/2020   Anemia 04/16/2020   Osteoarthritis of right hip 03/24/2020   Status post right hip replacement 03/24/2020   Pre-op evaluation 02/19/2020   Dementia with Lewy bodies (CODE) 06/07/2019   Auditory hallucinations 05/09/2019   Medicare annual wellness visit, subsequent 05/08/2019   Health maintenance examination 05/08/2019   Advanced care planning/counseling discussion 05/08/2019   Chronic insomnia 02/08/2019   GERD (gastroesophageal reflux disease) 10/16/2018   Aortic atherosclerosis ([place]) 11/04/2016   Coronary artery disease involving native coronary artery of native heart without angina pectoris 11/04/2016   Hiatal hernia 01/06/2015   Hematemesis without nausea 01/03/2015   PSVT (paroxysmal supraventricular tachycardia) ([place]) 04/18/2014   Dizziness 02/21/2014   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   Impotence of organic origin    Essential hypertension 01/08/2010   BPH with obstruction/lower urinary tract symptoms 01/07/2010   Bilateral hearing loss 12/08/2008   Gait disturbance 12/08/2008   Hyperlipidemia LDL goal <100 07/23/2008   CKD (chronic kidney disease) stage 3, GFR 30-59 ml/min ([place]) 07/23/2008   History of  colon cancer 07/22/2008    Past Surgical History:  Procedure Laterality Date   CATARACT EXTRACTION W/ INTRAOCULAR LENS IMPLANT Bilateral    COLONOSCOPY     HEMICOLOECTOMY W/ ANASTOMOSIS  1988   [name]   INGUINAL HERNIA REPAIR Right 1963   MASTECTOMY, PARTIAL Left 02/07/2013   LEFT SUBCUTANEOUS MASTECTOMY; Imogene Burn. Georgette Dover, MD   ROTATOR CUFF REPAIR Right 2000    [facility]   TOTAL HIP ARTHROPLASTY Right 03/24/2020   Procedure: TOTAL  HIP ARTHROPLASTY ANTERIOR APPROACH;  Surgeon: Paralee Cancel, MD;  Location: [facility];  Service: Orthopedics;  Laterality: Right;  70 mins   TOTAL KNEE ARTHROPLASTY  03/05/2012   Procedure: TOTAL KNEE ARTHROPLASTY;  Surgeon: Gearlean Alf, MD;  Location: [facility];  Service: Orthopedics;  Laterality: Left;   [place] [facility], x 4   VEIN LIGATION AND STRIPPING Right     Prior to Admission medications   Medication Sig Start Date End Date Taking? Authorizing Provider  oxyCODONE (ROXICODONE) 5 MG immediate release tablet Take 1 tablet (5 mg total) by mouth every 12 (twelve) hours as needed. 07/09/21 07/09/22 Yes Ladon Heney B, Michael [name]  acetaminophen (TYLENOL) 325 MG tablet Take 325 mg by mouth every 6 (six) hours as needed for moderate pain or headache.    [provider]  camphor-menthol Timoteo Ace) lotion Apply 1 application topically as needed for itching (dry skin). 11/12/20   [provider]  chlorthalidone (HYGROTON) 25 MG tablet Take 25 mg by mouth every morning. 12/21/20   [provider]  cholecalciferol (VITAMIN D3) 25 MCG (1000 UNIT) tablet Take 1,000 Units by mouth daily.    [provider]  diltiazem (CARDIZEM CD) 120 MG 24 hr capsule Take 1 capsule (120 mg total) by mouth daily. Patient taking differently: Take 120 mg by mouth 2 (two) times daily. 06/07/19   Ria Bush, MD  donepezil (ARICEPT) 10 MG tablet Take 0.5 tablets (5 mg total) by mouth at bedtime. Patient taking differently: Take 10 mg by mouth at bedtime. 04/14/21   Lomax, [name], NP  flecainide (TAMBOCOR) 100 MG tablet TAKE 1 TABLET BY MOUTH TWICE A DAY 10/10/16   Minna Merritts, MD  losartan (COZAAR) 50 MG tablet Take 50 mg by mouth in the morning and at bedtime. 11/20/20   [provider]  lovastatin (MEVACOR) 20 MG tablet TAKE 1 TABLET BY MOUTH ONCE DAILY Patient taking differently: Take 20 mg by mouth at bedtime. 03/06/17   Copland, Frederico Hamman, MD   Melatonin 5 MG CAPS Take 10 mg by mouth at bedtime.    [provider]  Multiple Vitamins-Minerals (CENTRUM SILVER ADULT 50+ PO) Take 1 tablet by mouth daily. Centrum Silver    [provider]  omeprazole (PRILOSEC) 20 MG capsule TAKE 1 CAPSULE BY MOUTH EVERY DAY AS NEEDED. 11/07/18   Copland, Frederico Hamman, MD  Polyethyl Glycol-Propyl Glycol (SYSTANE OP) Place 1 drop into both eyes at bedtime.    [provider]  potassium chloride (KLOR-CON) 10 MEQ tablet Take 1 tablet (10 mEq total) by mouth daily. Patient taking differently: Take 10 mEq by mouth 2 (two) times daily. 01/12/21   Minna Merritts, MD  traZODone (DESYREL) 50 MG tablet Take 50-100 mg by mouth See admin instructions. Take 50 mg at bedtime, may take a second 50 mg dose as needed for sleep 11/12/20   [provider]  Vitamin A 3 MG (10000 UT) TABS Take 10,000 Units by mouth daily.  [provider]  vitamin B-12 (CYANOCOBALAMIN) 1000 MCG tablet Take 1,000 mcg by mouth daily.    [provider]  zinc gluconate 50 MG tablet Take 50 mg by mouth daily.    [provider]    Allergies Patient has no known allergies.  Family History  Problem Relation Age of Onset   Osteoarthritis Father    Congestive Heart Failure Mother     Social History Social History   Tobacco Use   Smoking status: Never   Smokeless tobacco: Never  Vaping Use   Vaping Use: Never used  Substance Use Topics   Alcohol use: Yes    Alcohol/week: 1.0 standard drink    Types: 1 Glasses of wine per week    Comment: Wine (1/2-1 glass 2x/week)   Drug use: No    Review of Systems Constitutional: Negative for fever. Cardiovascular: Negative for chest pain. Respiratory: Negative for shortness of breath. Musculoskeletal: Positive for right knee pain. Skin: Positive for right knee swelling. Neurological: Negative for decrease in sensation  ____________________________________________   PHYSICAL  EXAM:  VITAL SIGNS: ED Triage Vitals  Enc Vitals Group     BP 07/09/21 1656 131/85     Pulse Rate 07/09/21 1656 66     Resp 07/09/21 1656 18     Temp 07/09/21 1656 99.1 F (37.3 C)     Temp Source 07/09/21 1656 Oral     SpO2 07/09/21 1656 95 %     Weight 07/09/21 1621 191 lb 9.3 oz (86.9 kg)     Height 07/09/21 1621 5\' 9"  (1.753 m)     Head Circumference --      Peak Flow --      Pain Score 07/09/21 1621 8     Pain Loc --      Pain Edu? --      Excl. in [place]? --     Constitutional: Alert and oriented. Well appearing and in no acute distress. Eyes: Conjunctivae are clear without discharge or drainage Head: Atraumatic Neck: Supple. Respiratory: No cough. Respirations are even and unlabored. Musculoskeletal: Obvious joint effusion of the right knee. Neurologic: Awake, alert, oriented Skin: Skin overlying the right knee intact. Psychiatric: Affect and behavior are appropriate.  ____________________________________________   LABS (all labs ordered are listed, but only abnormal results are displayed)  Labs Reviewed - No data to display ____________________________________________  RADIOLOGY  No acute bony abnormality.  Tricompartmental arthritis.  Moderate joint effusion.  I, Michael [name], personally viewed and evaluated these images (plain radiographs) as part of my medical decision making, as well as reviewing the written report by the radiologist.  DG Knee Complete 4 Views Right  Result Date: 07/09/2021 CLINICAL DATA:  Pain, fall. Mechanical fall today stepping out of the shower. Fall onto right knee. EXAM: RIGHT KNEE - COMPLETE 4+ VIEW COMPARISON:  None. FINDINGS: The bones are diffusely under mineralized. No evidence of acute fracture, however there is a moderate knee joint effusion. No definite lipohemarthrosis. Tricompartmental osteoarthritis with peripheral spurring. Probable ossified intra-articular bodies posteriorly in the joint. Vascular calcifications are seen.  IMPRESSION: 1. No visualized fracture of the right knee. However there is a moderate knee joint effusion, and the bones are diffusely under mineralized. This limits assessment for subtle nondisplaced fracture. If there is clinical concern for fracture, recommend further assessment with CT. 2. Tricompartmental osteoarthritis with likely ossified intra-articular bodies posteriorly in the joint. Electronically Signed   By: Michael [name] M.D.   On: 07/09/2021 16:53  ____________________________________________   PROCEDURES  .Joint Aspiration/Arthrocentesis  Date/Time: 07/09/2021 11:19 PM Performed by: Michael Dike, Michael [name] Authorized by: Michael Dike, Michael [name]   Consent:    Consent obtained:  Verbal   Consent given by:  Patient   Risks discussed:  Bleeding, infection, pain and incomplete drainage   Alternatives discussed:  Alternative treatment and observation Universal protocol:    Procedure explained and questions answered to patient or proxy's satisfaction: yes     Imaging studies available: yes     Patient identity confirmed:  Verbally with patient Location:    Location:  Knee Anesthesia:    Anesthesia method:  Local infiltration   Local anesthetic:  Lidocaine 1% w/o epi Procedure details:    Preparation: Patient was prepped and draped in usual sterile fashion     Needle gauge:  18 G   Approach:  Lateral   Aspirate amount:  20   Aspirate characteristics:  Blood-tinged and bloody   Steroid injected: no     Specimen collected: no   Post-procedure details:    Dressing:  Adhesive bandage   Procedure completion:  Tolerated well, no immediate complications  ____________________________________________   INITIAL IMPRESSION / ASSESSMENT AND PLAN / ED COURSE  Michael [name] is a 87 y.o. who presents to the emergency department for treatment and evaluation of traumatic right knee pain.  See HPI for further details.  Patient requested arthrocentesis.  Risks versus benefits  discussed.  See procedure details above.  Patient was able to bear weight with his usual amount of assistance postprocedure.  He was placed in a hinged knee brace and is to call and schedule follow-up appointment with orthopedics.  Also, patient states that he had to cancel the steroid injections for his back due to an upcoming abdominal surgery, he is out of his pain medication and requests refill.  PDMP reviewed.  Prescription for 12 tablets provided.  He is to follow-up with his surgeon or primary care provider for additional medication refills.  Medications  oxyCODONE (Oxy IR/ROXICODONE) immediate release tablet 5 mg (5 mg Oral Given 07/09/21 2030)  lidocaine (PF) (XYLOCAINE) 1 % injection 5 mL (5 mLs Intradermal Given by Other 07/09/21 2036)    Pertinent labs & imaging results that were available during my care of the patient were reviewed by me and considered in my medical decision making (see chart for details).   _________________________________________   FINAL CLINICAL IMPRESSION(S) / ED DIAGNOSES  Final diagnoses:  Traumatic effusion of knee joint    ED Discharge Orders          Ordered    oxyCODONE (ROXICODONE) 5 MG immediate release tablet  Every 12 hours PRN        07/09/21 2128             If controlled substance prescribed during this visit, 12 month history viewed on the [place] prior to issuing an initial prescription for Schedule II or III opiod.    Michael Dike, Michael [name] 07/09/21 2324    Rada Hay, MD 07/10/21 [phone]

## 2021-07-14 ENCOUNTER — Encounter: Admission: RE | Payer: Self-pay | Source: Ambulatory Visit

## 2021-07-14 ENCOUNTER — Ambulatory Visit: Admission: RE | Admit: 2021-07-14 | Payer: Medicare Other | Source: Ambulatory Visit | Admitting: General Surgery

## 2021-07-14 HISTORY — DX: Atrioventricular block, first degree: I44.0

## 2021-07-14 HISTORY — DX: Atherosclerosis of aorta: I70.0

## 2021-07-14 IMAGING — DX DG SHOULDER 2+V*R*
3 series · 3 of 3 positions shown · non-contrast
Comparison: None.

CLINICAL DATA: Chronic shoulder pain

EXAM:
RIGHT SHOULDER - 2+ VIEW

[shoulder axial]
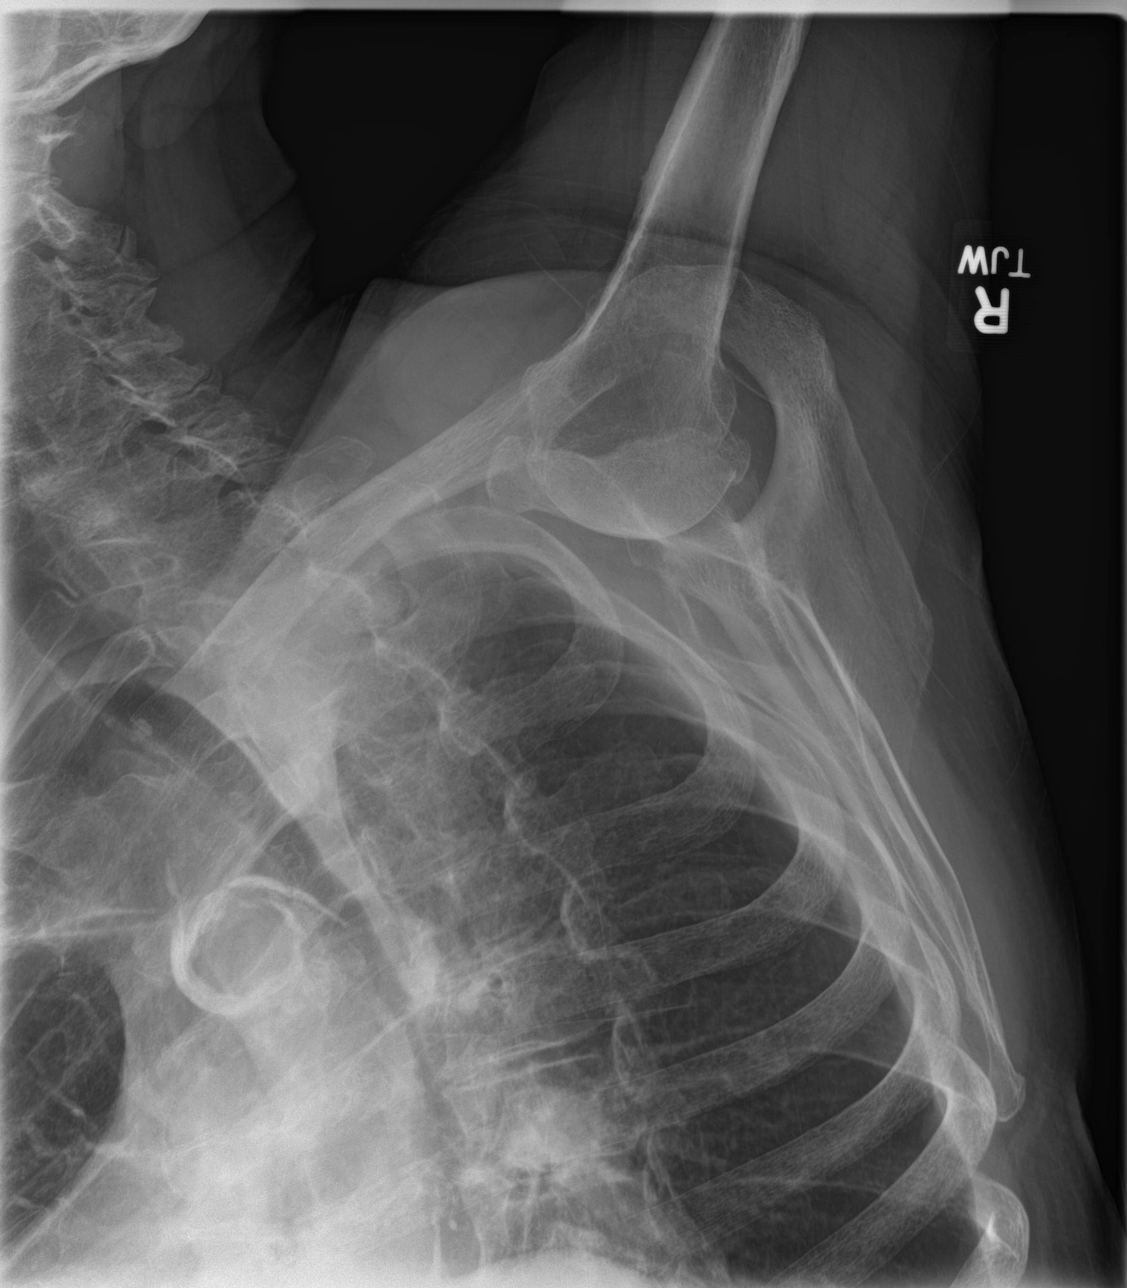

[shoulder ap]
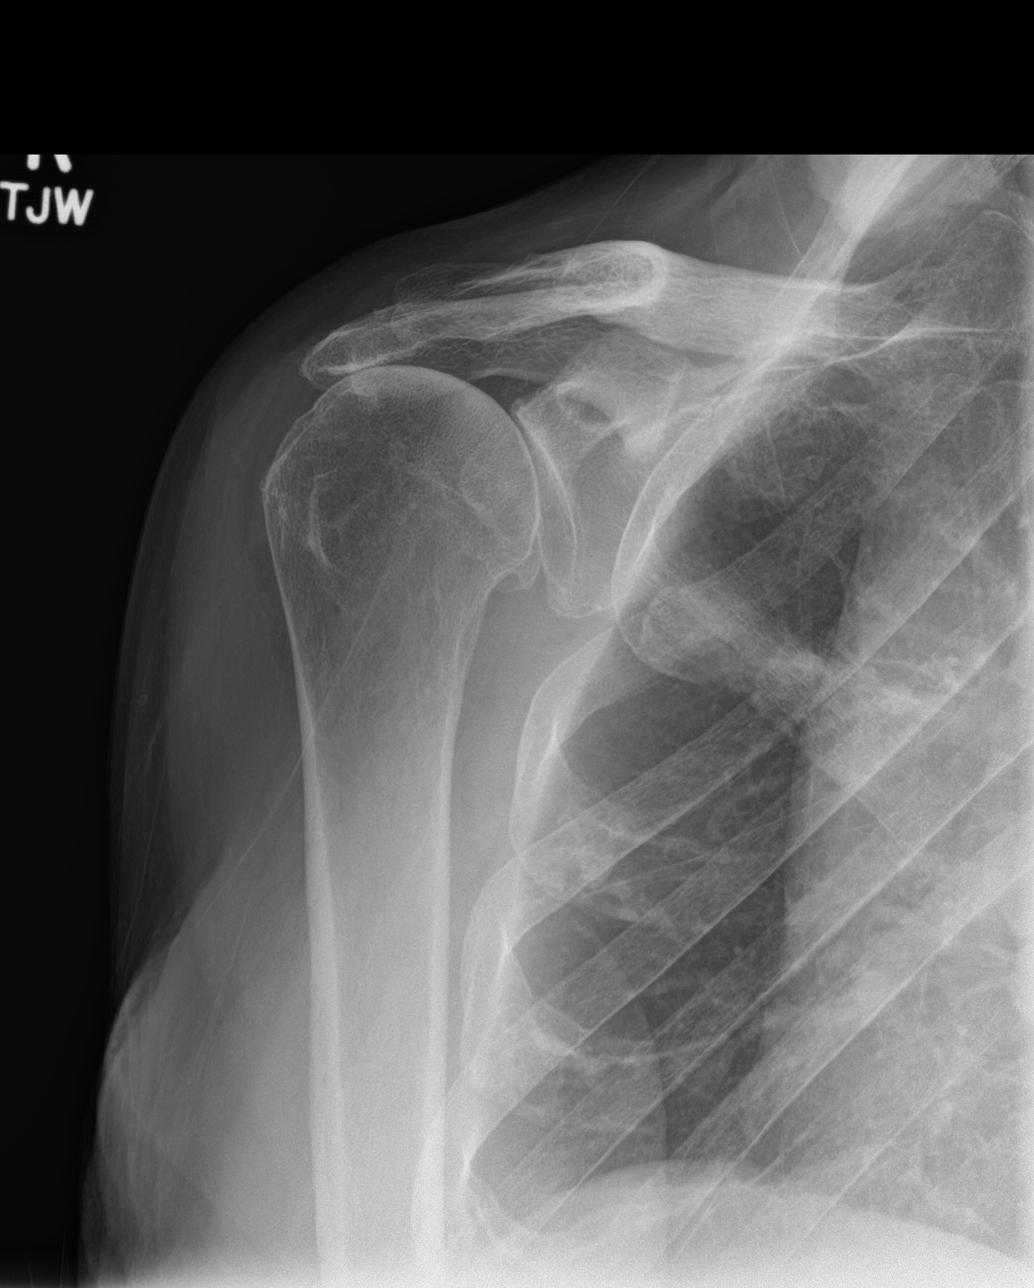

[shoulder y-view]
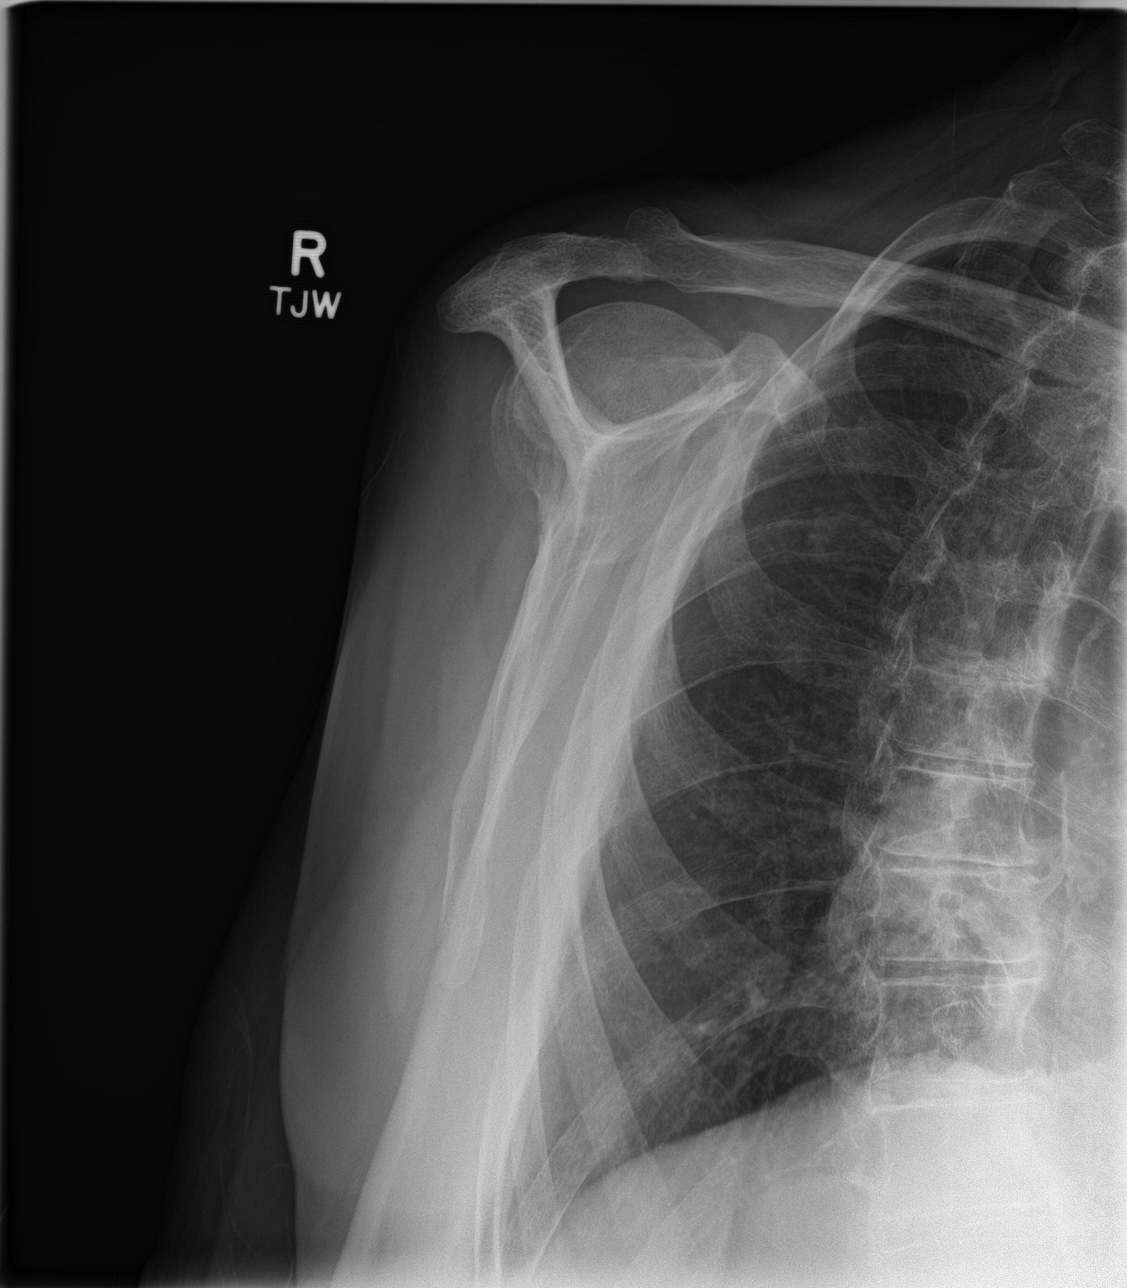

[3 of 3 positions shown; findings below may reference images not displayed]

FINDINGS: No fracture or dislocation. Moderate glenohumeral degenerative
change with mild AC joint degenerative change. High-riding humeral
head consistent with rotator cuff disease.
IMPRESSION: 1. No acute osseous abnormality.
2. Degenerative changes of the AC joint and glenohumeral joint.
High-riding humeral head consistent with rotator cuff disease.

## 2021-07-14 SURGERY — REPAIR, HERNIA, VENTRAL, ROBOT-ASSISTED
Anesthesia: General | Site: Abdomen

## 2021-08-27 ENCOUNTER — Telehealth: Payer: Self-pay | Admitting: Cardiovascular Disease

## 2021-08-27 NOTE — Telephone Encounter (Signed)
Patient wife calling to discuss abnormal EKG and wanting to change flecainide.   Pt c/o medication issue:  1. Name of Medication: flecainide   2. How are you currently taking this medication (dosage and times per day)? [facility]   3. Are you having a reaction (difficulty breathing--STAT)? Prolonged QT   4. What is your medication issue? Per patient wife told to stop due to prolonged qt on ekg at [facility].  Patient wife wants to know what he should take now instead.  Please advise.

## 2021-08-27 NOTE — Telephone Encounter (Signed)
Was able to return call to Michael [name], she reports Michael [name] had an aptp with the [facility] yesterday and they called today to advised that Michael [name] to STOP Flecainide.  [facility] Mauricia Area  I reviewed 12-lead ECGs. QRSd 110 msec. QTc prolongation noted. Also reviewed  recent cardiology note and see Michael [name] now has renal dysfunction, last  creatinine measurement 1.9 in Sept 2022 (outside provider, [facility]). I  called Michael [name] and spoke with Michael [name] by phone 08/27/2021 to explain these  findings and the concern for proarrhythmic effects. I instructed Michael [name] to  discontinue flecainide immediately. Michael [name] has not taken any flecainide since  yesterday and Michael [name] confirmed she took the medication out of Michael medication  bin today. I also scheduled EP follow-up with repeat 12-lead ECG on Thursday  09/02/2021 at 11:00 AM with Dr Lamar Blinks (last seen by EP March 2021). Michael [name]  expressed understanding, agrees with plan of care and confirmed EP follow-up  Michael. [name] is wanting an appt to discuss change as Michael [name] has been on Flecainide for 2 years, but the [place] told her it was "damaging my husband's heart muscles, something about the QT interval are too long", voiced understanding and attempted to explain QT intervals prolong in an EKG, [name] verbalized some understanding and thankful for attempt to explained/educate her for better understanding.    Was able to schedule pt at earliest convenient for them on 1/23 with Dr. Rockey Situ, but was able to place on wait list to attempt for sooner appt d/t holiday as to why longer wait. Dr. Rockey Situ made verbally aware, no recommendations at this time, will repeat EKG at next office visit.   Michael [name] thankful for the return call, will monitor pt's HR and call back if any changes, pt placed in wait list for sooner appt if one comes available.

## 2021-09-09 ENCOUNTER — Telehealth: Payer: Self-pay | Admitting: Cardiovascular Disease

## 2021-09-09 NOTE — Telephone Encounter (Signed)
Made in error

## 2021-09-20 NOTE — Progress Notes (Signed)
Cardiology Office Note  Date:  09/21/2021   ID:  Michael [name], Michael [name] [birth year], MRN [id number]  PCP:  Juluis Pitch, MD   Chief Complaint  Patient presents with   Medication Problem    Patient c/o LE edema. Medications reviewed by the patient verbally.     HPI:  [name] is a pleasant 88 year old male with past medical history of Three-vessel CAD on CT scan Moderate diffuse aortic athero  total knee replacement,  renal dysfunction,  SVT previously seen by Dr. Rayann Heman, plan was made for SVT ablation. in the end he changed his mind and canceled the procedure,  He presents today for follow-up of his SVT and CAD  Last seen by myself April 2021  Every once in a while gets tachycardia, Once a week , few minutes  Chronic mild b/l leg swelling Chronic back pain, getting epidurals  Seen at the [place], told to stop flecainide secondary to "QT" They wanted to place a loop monitor Patient declined  Labs reviewed CR 1.9  No syncope, reports feeling well  Sedentary, lots of reading  EKG personally reviewed by myself on todays visit normal sinus rhythm with rate 61 beats a minute, no significant ST or T-wave changes  Other past medical history reviewed Episode of tachycardia September 2020 Likely with SVT on 05/24/2019, 3 hours,  rate 140, with associated hypotension Symptoms similar to prior SVT episodes Previously declined ablation  CT scan September 2017 for pneumonia reviewed with him in detail extensive coronary calcifications, all 3 coronary vessels , at least moderate diffuse aortic atherosclerosis  Previous 30 day monitor  showed runs of SVT, heart rate up to 177 beats per minute associated with dizziness. Continued to have runs of SVT even on diltiazem 120 mg daily    PMH:   has a past medical history of Anemia, Aortic atherosclerosis ([place]), Arthritis, Aspiration pneumonia ([place]) (2020), BPH (benign prostatic hypertrophy), CAD (coronary artery disease) (05/23/2016),  Cellulitis of face (12/21/2019), Chronic back pain, CKD (chronic kidney disease), stage III ([place]), Colon cancer ([place]) (9150), Diastolic dysfunction (56/97/9480), Diverticulosis, Essential hypertension, First degree AV block, GERD (gastroesophageal reflux disease), H/O calcium pyrophosphate deposition disease (CPPD), Hiatal hernia (01/06/2015), History of kidney stones, HLD (hyperlipidemia), HOH (hard of hearing), Hypokalemia, Impotence of organic origin, Lewy body dementia ([place]), Peritonsillar abscess determined by examination (05/15/2019), PSVT (paroxysmal supraventricular tachycardia) ([place]), REM sleep 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  PSH:    Past Surgical History:  Procedure Laterality Date   CATARACT EXTRACTION W/ INTRAOCULAR LENS IMPLANT Bilateral    COLONOSCOPY     HEMICOLOECTOMY W/ ANASTOMOSIS  1988   [name]   INGUINAL HERNIA REPAIR Right 1963   MASTECTOMY, PARTIAL Left 02/07/2013   LEFT SUBCUTANEOUS MASTECTOMY; Imogene Burn. Georgette Dover, MD   ROTATOR CUFF REPAIR Right 2000    [facility]   TOTAL HIP ARTHROPLASTY Right 03/24/2020   Procedure: TOTAL HIP ARTHROPLASTY ANTERIOR APPROACH;  Surgeon: Paralee Cancel, MD;  Location: [facility];  Service: Orthopedics;  Laterality: Right;  70 mins   TOTAL KNEE ARTHROPLASTY  03/05/2012   Procedure: TOTAL KNEE ARTHROPLASTY;  Surgeon: Gearlean Alf, MD;  Location: [facility];  Service: Orthopedics;  Laterality: Left;   [place] [facility], x 4   VEIN LIGATION AND STRIPPING Right     Current Outpatient Medications  Medication Sig Dispense Refill   acetaminophen (TYLENOL) 325 MG tablet Take 325 mg by mouth every 6 (  six) hours as needed for moderate pain or headache.     camphor-menthol (SARNA) lotion Apply 1 application topically as needed for itching (dry skin).     chlorthalidone (HYGROTON) 25 MG tablet Take 25 mg by mouth every morning.      cholecalciferol (VITAMIN D3) 25 MCG (1000 UNIT) tablet Take 1,000 Units by mouth daily.     diltiazem (CARDIZEM) 120 MG tablet Take 120 mg by mouth in the morning and at bedtime.     donepezil (ARICEPT) 10 MG tablet Take 0.5 tablets (5 mg total) by mouth at bedtime. 90 tablet 3   flecainide (TAMBOCOR) 100 MG tablet Take 100 mg by mouth 2 (two) times daily.     losartan (COZAAR) 50 MG tablet Take 1 tablet by mouth 2 (two) times daily.     lovastatin (MEVACOR) 20 MG tablet TAKE 1 TABLET BY MOUTH ONCE DAILY 90 tablet 2   Melatonin 5 MG CAPS Take 10 mg by mouth at bedtime.     Multiple Vitamins-Minerals (CENTRUM SILVER ADULT 50+ PO) Take 1 tablet by mouth daily. Centrum Silver     omeprazole (PRILOSEC) 20 MG capsule TAKE 1 CAPSULE BY MOUTH EVERY DAY AS NEEDED. 90 capsule 3   oxyCODONE (ROXICODONE) 5 MG immediate release tablet Take 1 tablet (5 mg total) by mouth every 12 (twelve) hours as needed. 12 tablet 0   Polyethyl Glycol-Propyl Glycol (SYSTANE OP) Place 1 drop into both eyes at bedtime.     potassium chloride (KLOR-CON M) 10 MEQ tablet Take 10 mEq by mouth 2 (two) times daily.     traZODone (DESYREL) 50 MG tablet Take 50-100 mg by mouth See admin instructions. Take 50 mg at bedtime, may take a second 50 mg dose as needed for sleep     Vitamin A 3 MG (10000 UT) TABS Take 10,000 Units by mouth daily.     zinc gluconate 50 MG tablet Take 50 mg by mouth daily.     vitamin B-12 (CYANOCOBALAMIN) 1000 MCG tablet Take 1,000 mcg by mouth daily. (Patient not taking: Reported on 09/21/2021)     No current facility-administered medications for this visit.    Allergies:   Patient has no known allergies.   Social History:  The patient  reports that he has never smoked. He has never used smokeless tobacco. He reports current alcohol use of about 1.0 standard drink per week. He reports that he does not use drugs.   Family History:   family history includes Congestive Heart Failure in his mother;  Osteoarthritis in his father.    Review of Systems: Review of Systems  Constitutional: Negative.   HENT: Negative.    Respiratory: Negative.    Cardiovascular: Negative.   Gastrointestinal: Negative.   Musculoskeletal: Negative.   Neurological: Negative.   Psychiatric/Behavioral: Negative.    All other systems reviewed and are negative.  PHYSICAL EXAM: VS:  BP 140/68 (BP Location: Left Arm, Patient Position: Sitting, Cuff Size: Normal)    Pulse 61    Ht 5\' 9"  (1.753 m)    Wt 197 lb 4 oz (89.5 kg)    SpO2 97%    BMI 29.13 kg/m  , BMI Body mass index is 29.13 kg/m. Constitutional:  oriented to person, place, and time. No distress.  Hard of hearing HENT:  Head: Grossly normal Eyes:  no discharge. No scleral icterus.  Neck: No JVD, no carotid bruits  Cardiovascular: Regular rate and rhythm, no murmurs appreciated Trace pitting lower extremity edema, compression hose  in place Pulmonary/Chest: Clear to auscultation bilaterally, no wheezes or rails Abdominal: Soft.  no distension.  no tenderness.  Musculoskeletal: Normal range of motion Neurological:  normal muscle tone. Coordination normal. No atrophy Skin: Skin warm and dry Psychiatric: normal affect, pleasant  Recent Labs: 07/08/2021: Potassium 3.1    Lipid Panel Lab Results  Component Value Date   CHOL 138 05/07/2020   HDL 64.30 05/07/2020   LDLCALC 54 05/07/2020   TRIG 97.0 05/07/2020      Wt Readings from Last 3 Encounters:  09/21/21 197 lb 4 oz (89.5 kg)  07/09/21 191 lb 9.3 oz (86.9 kg)  04/14/21 191 lb 8 oz (86.9 kg)     ASSESSMENT AND PLAN:   SVT (supraventricular tachycardia) (HCC) - Rare episodes paroxysmal tachycardia, once a week he reports lasting less than a minute Of note he does have coronary disease on CT scan Will hold the flecainide, transition to amiodarone 200 twice daily for 2 weeks then 200 daily continue diltiazem twice daily Previously declined ablation No indication for loop at this  time For worsening paroxysmal tachycardia recommended he take extra amiodarone  Coronary artery disease involving native coronary artery of native heart without angina pectoris Heavy coronary calcification seen on CT scan Low risk stress test August 2018 Cholesterol was above goal on last check with primary care, wife reports that she has stopped the lovastatin Recommend he restart lovastatin 20, if still not at goal may need to add Zetia  Aortic atherosclerosis ([place]) Moderate diffuse disease on CT scan September 2017 Restart lovastatin as above  Essential hypertension Blood pressure is well controlled on today's visit. No changes made to the medications. May need to increase fluids, creatinine elevated on chlorthalidone   Orders Placed This Encounter  Procedures   EKG 12-Lead     Signed, Esmond Plants, M.D., Ph.D. 09/21/2021  [place], [place]

## 2021-09-21 ENCOUNTER — Encounter: Payer: Self-pay | Admitting: Cardiovascular Disease

## 2021-09-21 ENCOUNTER — Ambulatory Visit: Payer: Medicare Other | Admitting: Cardiovascular Disease

## 2021-09-21 ENCOUNTER — Other Ambulatory Visit: Payer: Self-pay

## 2021-09-21 VITALS — BP 140/68 | HR 61 | Ht 69.0 in | Wt 197.2 lb

## 2021-09-21 DIAGNOSIS — I700 Atherosclerosis of aorta: Secondary | ICD-10-CM

## 2021-09-21 DIAGNOSIS — I10 Essential (primary) hypertension: Secondary | ICD-10-CM | POA: Diagnosis not present

## 2021-09-21 DIAGNOSIS — I471 Supraventricular tachycardia: Secondary | ICD-10-CM

## 2021-09-21 DIAGNOSIS — E785 Hyperlipidemia, unspecified: Secondary | ICD-10-CM

## 2021-09-21 MED ORDER — LOVASTATIN 20 MG PO TABS: 20.0000 mg | ORAL_TABLET | Freq: Every day | ORAL | 3 refills | Status: DC

## 2021-09-21 MED ORDER — AMIODARONE HCL 200 MG PO TABS: 200.0000 mg | ORAL_TABLET | Freq: Every day | ORAL | 3 refills | Status: DC

## 2021-09-21 MED ORDER — LOSARTAN POTASSIUM 50 MG PO TABS: 50.0000 mg | ORAL_TABLET | Freq: Two times a day (BID) | ORAL | 3 refills | Status: DC

## 2021-09-21 MED ORDER — DILTIAZEM HCL 120 MG PO TABS: 120.0000 mg | ORAL_TABLET | Freq: Two times a day (BID) | ORAL | 3 refills | Status: DC

## 2021-09-21 NOTE — Patient Instructions (Addendum)
Medication Instructions:  Please STOP  flecainide  Please START  Friday 1/13 amiodarone 200 mg twice a day for 2 weeks Then down to amiodarone 200 mg once a day  Go back on lovastatin  If you need a refill on your cardiac medications before your next appointment, please call your pharmacy.   Lab work: No new labs needed  Testing/Procedures: No new testing needed  Follow-Up: At [facility], you and your health needs are our priority.  As part of our continuing mission to provide you with exceptional heart care, we have created designated Provider Care Teams.  These Care Teams include your primary Cardiologist (physician) and Advanced Practice Providers (APPs -  Physician Assistants and Nurse Practitioners) who all work together to provide you with the care you need, when you need it.  You will need a follow up appointment in 6 months  Providers on your designated Care Team:   Murray Hodgkins, NP Christell Faith, PA-C Cadence Kathlen Mody, [place]  COVID-19 Vaccine Information can be found at: [email or web address] For questions related to vaccine distribution or appointments, please email vaccine@ [email or web address] or call [phone].

## 2021-09-22 ENCOUNTER — Other Ambulatory Visit: Admission: RE | Admit: 2021-09-22 | Payer: Medicare Other | Source: Ambulatory Visit

## 2021-09-22 ENCOUNTER — Telehealth: Payer: Self-pay | Admitting: Cardiovascular Disease

## 2021-09-22 NOTE — Telephone Encounter (Cosign Needed)
Was able to return call to pt's wife to discuss interaction of pt's new amiodarone and already prescribe Donepzil. Was able to advise on PharmD perception  There is an increased risk of bradycardia and QTc prolongation when amiodarone and donepezil are used together. Would recommend closer EKG follow up for monitoring.  Advised we will be able to monitor with EKG to look for changes, also monitor HR at home, if HR <55 then call to inform. Otherwise can continue to take both meds together. If develops CP then please also call in so we can arrange for EKG to assess for changes. Michael [name] verbalized understanding.  Otherwise all questions were address and no additional concerns at this time. Mrs. Payes thankful for the return call and advice. Agreeable to plan, will call back for anything further.

## 2021-09-22 NOTE — Telephone Encounter (Signed)
ERROR

## 2021-09-22 NOTE — Telephone Encounter (Signed)
Please call to discuss Amiodarone. Patient wife states that his pharmacist states there is an interaction with Donepezil.

## 2021-09-22 NOTE — Telephone Encounter (Signed)
There is an increased risk of bradycardia and QTc prolongation when amiodarone and donepezil are used together. Would recommend closer EKG follow up for monitoring.

## 2021-09-23 DIAGNOSIS — G894 Chronic pain syndrome: Secondary | ICD-10-CM | POA: Diagnosis not present

## 2021-09-23 DIAGNOSIS — M5416 Radiculopathy, lumbar region: Secondary | ICD-10-CM | POA: Diagnosis not present

## 2021-09-29 ENCOUNTER — Ambulatory Visit: Admit: 2021-09-29 | Payer: Medicare Other | Admitting: General Surgery

## 2021-09-29 SURGERY — REPAIR, HERNIA, VENTRAL, ROBOT-ASSISTED
Anesthesia: General | Site: Abdomen

## 2021-10-08 ENCOUNTER — Ambulatory Visit: Payer: Medicare Other | Admitting: Cardiovascular Disease

## 2021-10-13 DIAGNOSIS — K432 Incisional hernia without obstruction or gangrene: Secondary | ICD-10-CM

## 2021-10-13 HISTORY — DX: Incisional hernia without obstruction or gangrene: K43.2

## 2021-10-21 ENCOUNTER — Ambulatory Visit: Payer: Self-pay | Admitting: General Surgery

## 2021-10-21 DIAGNOSIS — K432 Incisional hernia without obstruction or gangrene: Secondary | ICD-10-CM | POA: Diagnosis not present

## 2021-10-21 NOTE — H&P (View-Only) (Signed)
PATIENT PROFILE: Michael [name] is a 88 y.o. male who presents to the Clinic for consultation at the request of Dr. Lovie Macadamia for evaluation of incisional hernia.   PCP:  Dennison Nancy, MD   HISTORY OF PRESENT ILLNESS: Michael [name]  was previously evaluated due to an incisional hernia in the mid abdomen.  He endorses having significant pain when the hernia is protruding.  The pain almost made him bend over and is making it very difficult for him to reduce the hernia.  The pain is localized to the paramedical area.  No pain radiation.  Aggravating factor is increasing intra-abdominal pressure.  Alleviating factor is laying down and reducing the hernia.  There has been getting more difficult recently.  Patient is having crescendo symptoms.  Very concerning for incarceration.  CT scan of the abdomen pelvis show a 3 cm incisional hernia at the mid abdomen.  After coordinating surgery he had a fall from his feet and he needed treatment for his knee swelling.  He notes that the knee has improved for now he is able to tolerate the pain.     PROBLEM LIST:        Problem List  Never Reviewed          Noted    Hyperlipidemia 04/21/2021    Leg swelling 04/13/2020    Anemia, unspecified type 04/13/2020    Status post total hip replacement, right 03/24/2020    Dementia with Lewy bodies (CODE) (CMS-HCC) 06/07/2019    Overview      Formatting of this note might be different from the original. 05/2019: MMSE 25/30 (missed 1 orientation, 1 recall, 3 language), clock drawing 3/4, largely independent on ADLs, IADLs. Continues driving.    Last Assessment & Plan:  Formatting of this note might be different from the original. Sees neurology, mild LBD.        Coronary artery disease involving native coronary artery of native heart without angina pectoris 11/04/2016    GERD (gastroesophageal reflux disease) 10/26/2014    Overview      Last Assessment & Plan:  Formatting of this note might be different from the  original. Continues omeprazole 17m daily.        PSVT (paroxysmal supraventricular tachycardia) (CMS-HCC) 09/12/2013    Overview      Formatting of this note might be different from the original. a. 04/2014 Event Monitor: Freq bouts of SVT with rates to 180-->seen by EP with recommendation for RFCA, pt deferred.   Last Assessment & Plan:  Formatting of this note might be different from the original. On dilt and flecainide.  Not thought consistent with afib so not on anticoagulant.        Essential hypertension 01/08/2010    Overview      Last Assessment & Plan:  Formatting of this note might be different from the original. Good control on current regimen - continue.        BPH with obstruction/lower urinary tract symptoms 01/07/2010    Overview      Formatting of this note might be different from the original. flomax caused am hypotension   Last Assessment & Plan:  Formatting of this note might be different from the original. Unclear if still taking finasteride - will need to assess next visit.        CKD (chronic kidney disease) stage 3, GFR 30-59 ml/min (CMS-HCC) 07/23/2008    Overview      Last Assessment & Plan:  Formatting of this  note might be different from the original. Update kidney function.             GENERAL REVIEW OF SYSTEMS:    General ROS: negative for - chills, fatigue, fever, weight gain or weight loss Allergy and Immunology ROS: negative for - hives  Hematological and Lymphatic ROS: negative for - bleeding problems or bruising, negative for palpable nodes Endocrine ROS: negative for - heat or cold intolerance, hair changes Respiratory ROS: negative for - cough, shortness of breath or wheezing Cardiovascular ROS: no chest pain or palpitations GI ROS: negative for nausea, vomiting, diarrhea, constipation.  Positive for abdominal pain Musculoskeletal ROS: negative for - joint swelling or muscle pain Neurological ROS: negative for - confusion,  syncope Dermatological ROS: negative for pruritus and rash Psychiatric: negative for anxiety, depression, difficulty sleeping and memory loss   MEDICATIONS: Current Medications        Current Outpatient Medications  Medication Sig Dispense Refill   betamethasone dipropionate, augmented, (DIPROLENE-AF) 0.05 % cream as needed       calcium carbonate-vitamin D3 (CALTRATE 600+D) 600 mg-10 mcg (400 unit) tablet Take 1 tablet by mouth once daily       camphor-menthoL (SARNA) lotion as needed       chlorthalidone 25 MG tablet Take 1 tablet by mouth once daily       cholecalciferol (VITAMIN D3) 5,000 unit capsule Take 5,000 Units by mouth once daily          cyanocobalamin (VITAMIN B12) 1000 MCG tablet Take 500 mcg by mouth once daily          diltiazem (TIAZAC) 120 MG ER capsule Take 120 mg by mouth once daily       donepeziL (ARICEPT) 10 MG tablet Take 10 mg by mouth at bedtime       flecainide (TAMBOCOR) 100 MG tablet Take 100 mg by mouth 2 (two) times daily          folic acid/multivit-min/lutein (CENTRUM SILVER ORAL) Take 1 tablet by mouth once daily       gabapentin (NEURONTIN) 100 MG capsule Take 100 mg by mouth once daily as needed          HYDROcodone-acetaminophen (NORCO) 7.5-325 mg tablet Take 1-2 tablets by mouth every 6 (six) hours as needed (Give 1 tablet for mild to moderate pain 1-5/10; give 2 tablets for moderate to severe pain 6/10)          lanolin/mineral oil/petrolatum (ARTIFICIAL TEARS OPHTH) Apply to eye       losartan (COZAAR) 50 MG tablet Take 1 tablet (50 mg total) by mouth 2 (two) times daily 180 tablet 1   lovastatin (MEVACOR) 20 MG tablet Take 20 mg by mouth once daily          melatonin 5 mg Tab Take 10 mg by mouth nightly as needed          mineral oil/petrolatum,white (WHITE PETROLATUM-MINERAL OIL OPHTH) as needed       omeprazole (PRILOSEC) 20 MG DR capsule TAKE 1 CAPSULE BY MOUTH DAILY AS NEEDED       potassium chloride (KLOR-CON) 10 MEQ ER tablet Take 1 tablet  (10 mEq total) by mouth 2 (two) times daily 180 tablet 1   propylene glycoL (SYSTANE BALANCE) 0.6 % ophthalmic drops INSTILL ONE DROP IN EACH EYE FOUR TIMES A DAY       traZODone (DESYREL) 50 MG tablet Take 1 tablet (50 mg total) by mouth at bedtime 90 tablet 0  zinc 50 mg Tab Take 50 mg by mouth once daily        No current facility-administered medications for this visit.        ALLERGIES: Patient has no known allergies.   PAST MEDICAL HISTORY:     Past Medical History:  Diagnosis Date   Chronic kidney disease     Hypertension     Lewy body dementia (CMS-HCC)     PSVT (paroxysmal supraventricular tachycardia) (CMS-HCC)        PAST SURGICAL HISTORY:      Past Surgical History:  Procedure Laterality Date   JOINT REPLACEMENT Right 10/2019    hip      FAMILY HISTORY: History reviewed. No pertinent family history.    SOCIAL HISTORY: Social History          Socioeconomic History   Marital status: Married  Tobacco Use   Smoking status: Never Smoker   Smokeless tobacco: Never Used  Substance and Sexual Activity   Alcohol use: Not Currently   Drug use: Never        PHYSICAL EXAM:    Vitals:    07/01/21 1532  BP: 113/63  Pulse: 61    Body mass index is 28.24 kg/m. Weight: 88 kg (194 lb)    GENERAL: Alert, active, oriented x3   HEENT: Pupils equal reactive to light. Extraocular movements are intact. Sclera clear. Palpebral conjunctiva normal red color.Pharynx clear.   NECK: Supple with no palpable mass and no adenopathy.   LUNGS: Sound clear with no rales rhonchi or wheezes.   HEART: Regular rhythm S1 and S2 without murmur.   ABDOMEN: Soft and depressible, nontender with no palpable mass, no hepatomegaly.  Palpable nonreducible incisional hernia.   EXTREMITIES: Well-developed well-nourished symmetrical with no dependent edema.   NEUROLOGICAL: Awake alert oriented, facial expression symmetrical, moving all extremities.   REVIEW OF DATA: I have  reviewed the following data today:      Ancillary Orders on 05/24/2021  Component Date Value   Glucose 05/24/2021 92    Sodium 05/24/2021 140    Potassium 05/24/2021 3.4 (!)   Chloride 05/24/2021 101    Carbon Dioxide (CO2) 05/24/2021 30.5    Calcium 05/24/2021 9.3    Urea Nitrogen (BUN) 05/24/2021 39 (!)   Creatinine 05/24/2021 1.9 (!)   Glomerular Filtration Ra* 05/24/2021 34 (!)   BUN/Crea Ratio 05/24/2021 20.5 (!)   Anion Gap w/K 05/24/2021 11.9   Appointment on 04/27/2021  Component Date Value   WBC (White Blood Cell Co* 04/27/2021 6.6    RBC (Red Blood Cell Coun* 04/27/2021 3.72 (!)   Hemoglobin 04/27/2021 12.0 (!)   Hematocrit 04/27/2021 36.9 (!)   MCV (Mean Corpuscular Vo* 04/27/2021 99.2    MCH (Mean Corpuscular He* 04/27/2021 32.3 (!)   MCHC (Mean Corpuscular H* 04/27/2021 32.5    Platelet Count 04/27/2021 275    RDW-CV (Red Cell Distrib* 04/27/2021 12.4    MPV (Mean Platelet Volum* 04/27/2021 10.3    Neutrophils 04/27/2021 4.60    Lymphocytes 04/27/2021 1.30    Mixed Count 04/27/2021 0.70    Neutrophil % 04/27/2021 71.0 (!)   Lymphocyte % 04/27/2021 19.0    Mixed % 04/27/2021 10.0    Glucose 04/27/2021 85    Sodium 04/27/2021 141    Potassium 04/27/2021 3.3 (!)   Chloride 04/27/2021 101    Carbon Dioxide (CO2) 04/27/2021 35.6 (!)   Urea Nitrogen (BUN) 04/27/2021 30 (!)   Creatinine 04/27/2021 1.6 (!)   Glomerular Filtration  Ra* 04/27/2021 41 (!)   Calcium 04/27/2021 9.0    AST  04/27/2021 22    ALT  04/27/2021 17    Alk Phos (alkaline Phosp* 04/27/2021 57    Albumin 04/27/2021 3.7    Bilirubin, Total 04/27/2021 0.4    Protein, Total 04/27/2021 6.6    A/G Ratio 04/27/2021 1.3    Cholesterol, Total 04/27/2021 157    Triglyceride 04/27/2021 102    HDL (High Density Lipopr* 04/27/2021 61.8    LDL Calculated 04/27/2021 75    VLDL Cholesterol 04/27/2021 20    Cholesterol/HDL Ratio 04/27/2021 2.5    Thyroid Stimulating Horm* 04/27/2021 1.709    Vitamin B12  04/27/2021 979    Color 04/27/2021 Yellow    Clarity 04/27/2021 Clear    Specific Gravity 04/27/2021 1.010    pH, Urine 04/27/2021 7.0    Protein, Urinalysis 04/27/2021 Trace    Glucose, Urinalysis 04/27/2021 Negative    Ketones, Urinalysis 04/27/2021 Negative    Blood, Urinalysis 04/27/2021 Negative    Nitrite, Urinalysis 04/27/2021 Negative    Leukocyte Esterase, Urin* 04/27/2021 Negative    White Blood Cells, Urina* 04/27/2021 None Seen    Red Blood Cells, Urinaly* 04/27/2021 None Seen    Bacteria, Urinalysis 04/27/2021 None Seen    Squamous Epithelial Cell* 04/27/2021 Rare       ASSESSMENT: Michael [name] is a 88 y.o. male presenting for reevaluation and surgery coordination for incarcerated incisional hernia.     The patient presents again with a symptomatic incarcerated incisional hernia. Patient was oriented about the diagnosis of incisional hernia and its implication. The patient was oriented about the treatment alternatives (observation vs surgical repair). Due to patient symptoms, repair is recommended. Patient oriented about the surgical procedure, the use of mesh and its risk of complications such as: infection, bleeding, injury to vasculature, injury to bowel or bladder, and chronic pain, intestinal obstruction, among others.    Due to previous incisional hernia repair with mesh in the past and the fact that patient has significant pain I recommend to proceed with CT scan of the abdomen appropriate for complete evaluation of complex incisional hernia.    CT scan shows a 3 cm incarcerated fat incisional hernia.  No sign of obstruction.  I discussed with the patient that we can proceed with minimally invasive robotic incisional hernia repair.   Incisional hernia, without obstruction or gangrene [K43.2]   PLAN: 2.  Robotic assisted laparoscopic incisional hernia repair with mesh (03559) 3.  Cardiac clearance 4.  CBC, CMP 5.  Avoid taking aspirin 5 days before the surgery 4.   Contact us if you have any concern   Patient and his wife verbalized understanding, all questions were answered, and were agreeable with the plan outlined above.      Herbert Pun, MD   Electronically signed by Herbert Pun, MD

## 2021-10-21 NOTE — H&P (Signed)
PATIENT PROFILE: Michael [name] is a 88 y.o. male who presents to the Clinic for consultation at the request of Dr. Lovie Macadamia for evaluation of incisional hernia.   PCP:  Dennison Nancy, MD   HISTORY OF PRESENT ILLNESS: Michael [name]  was previously evaluated due to an incisional hernia in the mid abdomen.  He endorses having significant pain when the hernia is protruding.  The pain almost made him bend over and is making it very difficult for him to reduce the hernia.  The pain is localized to the paramedical area.  No pain radiation.  Aggravating factor is increasing intra-abdominal pressure.  Alleviating factor is laying down and reducing the hernia.  There has been getting more difficult recently.  Patient is having crescendo symptoms.  Very concerning for incarceration.  CT scan of the abdomen pelvis show a 3 cm incisional hernia at the mid abdomen.  After coordinating surgery he had a fall from his feet and he needed treatment for his knee swelling.  He notes that the knee has improved for now he is able to tolerate the pain.     PROBLEM LIST:        Problem List  Never Reviewed          Noted    Hyperlipidemia 04/21/2021    Leg swelling 04/13/2020    Anemia, unspecified type 04/13/2020    Status post total hip replacement, right 03/24/2020    Dementia with Lewy bodies (CODE) (CMS-HCC) 06/07/2019    Overview      Formatting of this note might be different from the original. 05/2019: MMSE 25/30 (missed 1 orientation, 1 recall, 3 language), clock drawing 3/4, largely independent on ADLs, IADLs. Continues driving.    Last Assessment & Plan:  Formatting of this note might be different from the original. Sees neurology, mild LBD.        Coronary artery disease involving native coronary artery of native heart without angina pectoris 11/04/2016    GERD (gastroesophageal reflux disease) 10/26/2014    Overview      Last Assessment & Plan:  Formatting of this note might be different from the  original. Continues omeprazole 17m daily.        PSVT (paroxysmal supraventricular tachycardia) (CMS-HCC) 09/12/2013    Overview      Formatting of this note might be different from the original. a. 04/2014 Event Monitor: Freq bouts of SVT with rates to 180-->seen by EP with recommendation for RFCA, pt deferred.   Last Assessment & Plan:  Formatting of this note might be different from the original. On dilt and flecainide.  Not thought consistent with afib so not on anticoagulant.        Essential hypertension 01/08/2010    Overview      Last Assessment & Plan:  Formatting of this note might be different from the original. Good control on current regimen - continue.        BPH with obstruction/lower urinary tract symptoms 01/07/2010    Overview      Formatting of this note might be different from the original. flomax caused am hypotension   Last Assessment & Plan:  Formatting of this note might be different from the original. Unclear if still taking finasteride - will need to assess next visit.        CKD (chronic kidney disease) stage 3, GFR 30-59 ml/min (CMS-HCC) 07/23/2008    Overview      Last Assessment & Plan:  Formatting of this  note might be different from the original. Update kidney function.             GENERAL REVIEW OF SYSTEMS:    General ROS: negative for - chills, fatigue, fever, weight gain or weight loss Allergy and Immunology ROS: negative for - hives  Hematological and Lymphatic ROS: negative for - bleeding problems or bruising, negative for palpable nodes Endocrine ROS: negative for - heat or cold intolerance, hair changes Respiratory ROS: negative for - cough, shortness of breath or wheezing Cardiovascular ROS: no chest pain or palpitations GI ROS: negative for nausea, vomiting, diarrhea, constipation.  Positive for abdominal pain Musculoskeletal ROS: negative for - joint swelling or muscle pain Neurological ROS: negative for - confusion,  syncope Dermatological ROS: negative for pruritus and rash Psychiatric: negative for anxiety, depression, difficulty sleeping and memory loss   MEDICATIONS: Current Medications        Current Outpatient Medications  Medication Sig Dispense Refill   betamethasone dipropionate, augmented, (DIPROLENE-AF) 0.05 % cream as needed       calcium carbonate-vitamin D3 (CALTRATE 600+D) 600 mg-10 mcg (400 unit) tablet Take 1 tablet by mouth once daily       camphor-menthoL (SARNA) lotion as needed       chlorthalidone 25 MG tablet Take 1 tablet by mouth once daily       cholecalciferol (VITAMIN D3) 5,000 unit capsule Take 5,000 Units by mouth once daily          cyanocobalamin (VITAMIN B12) 1000 MCG tablet Take 500 mcg by mouth once daily          diltiazem (TIAZAC) 120 MG ER capsule Take 120 mg by mouth once daily       donepeziL (ARICEPT) 10 MG tablet Take 10 mg by mouth at bedtime       flecainide (TAMBOCOR) 100 MG tablet Take 100 mg by mouth 2 (two) times daily          folic acid/multivit-min/lutein (CENTRUM SILVER ORAL) Take 1 tablet by mouth once daily       gabapentin (NEURONTIN) 100 MG capsule Take 100 mg by mouth once daily as needed          HYDROcodone-acetaminophen (NORCO) 7.5-325 mg tablet Take 1-2 tablets by mouth every 6 (six) hours as needed (Give 1 tablet for mild to moderate pain 1-5/10; give 2 tablets for moderate to severe pain 6/10)          lanolin/mineral oil/petrolatum (ARTIFICIAL TEARS OPHTH) Apply to eye       losartan (COZAAR) 50 MG tablet Take 1 tablet (50 mg total) by mouth 2 (two) times daily 180 tablet 1   lovastatin (MEVACOR) 20 MG tablet Take 20 mg by mouth once daily          melatonin 5 mg Tab Take 10 mg by mouth nightly as needed          mineral oil/petrolatum,white (WHITE PETROLATUM-MINERAL OIL OPHTH) as needed       omeprazole (PRILOSEC) 20 MG DR capsule TAKE 1 CAPSULE BY MOUTH DAILY AS NEEDED       potassium chloride (KLOR-CON) 10 MEQ ER tablet Take 1 tablet  (10 mEq total) by mouth 2 (two) times daily 180 tablet 1   propylene glycoL (SYSTANE BALANCE) 0.6 % ophthalmic drops INSTILL ONE DROP IN EACH EYE FOUR TIMES A DAY       traZODone (DESYREL) 50 MG tablet Take 1 tablet (50 mg total) by mouth at bedtime 90 tablet 0  zinc 50 mg Tab Take 50 mg by mouth once daily        No current facility-administered medications for this visit.        ALLERGIES: Patient has no known allergies.   PAST MEDICAL HISTORY:     Past Medical History:  Diagnosis Date   Chronic kidney disease     Hypertension     Lewy body dementia (CMS-HCC)     PSVT (paroxysmal supraventricular tachycardia) (CMS-HCC)        PAST SURGICAL HISTORY:      Past Surgical History:  Procedure Laterality Date   JOINT REPLACEMENT Right 10/2019    hip      FAMILY HISTORY: History reviewed. No pertinent family history.    SOCIAL HISTORY: Social History          Socioeconomic History   Marital status: Married  Tobacco Use   Smoking status: Never Smoker   Smokeless tobacco: Never Used  Substance and Sexual Activity   Alcohol use: Not Currently   Drug use: Never        PHYSICAL EXAM:    Vitals:    07/01/21 1532  BP: 113/63  Pulse: 61    Body mass index is 28.24 kg/m. Weight: 88 kg (194 lb)    GENERAL: Alert, active, oriented x3   HEENT: Pupils equal reactive to light. Extraocular movements are intact. Sclera clear. Palpebral conjunctiva normal red color.Pharynx clear.   NECK: Supple with no palpable mass and no adenopathy.   LUNGS: Sound clear with no rales rhonchi or wheezes.   HEART: Regular rhythm S1 and S2 without murmur.   ABDOMEN: Soft and depressible, nontender with no palpable mass, no hepatomegaly.  Palpable nonreducible incisional hernia.   EXTREMITIES: Well-developed well-nourished symmetrical with no dependent edema.   NEUROLOGICAL: Awake alert oriented, facial expression symmetrical, moving all extremities.   REVIEW OF DATA: I have  reviewed the following data today:      Ancillary Orders on 05/24/2021  Component Date Value   Glucose 05/24/2021 92    Sodium 05/24/2021 140    Potassium 05/24/2021 3.4 (!)   Chloride 05/24/2021 101    Carbon Dioxide (CO2) 05/24/2021 30.5    Calcium 05/24/2021 9.3    Urea Nitrogen (BUN) 05/24/2021 39 (!)   Creatinine 05/24/2021 1.9 (!)   Glomerular Filtration Ra* 05/24/2021 34 (!)   BUN/Crea Ratio 05/24/2021 20.5 (!)   Anion Gap w/K 05/24/2021 11.9   Appointment on 04/27/2021  Component Date Value   WBC (White Blood Cell Co* 04/27/2021 6.6    RBC (Red Blood Cell Coun* 04/27/2021 3.72 (!)   Hemoglobin 04/27/2021 12.0 (!)   Hematocrit 04/27/2021 36.9 (!)   MCV (Mean Corpuscular Vo* 04/27/2021 99.2    MCH (Mean Corpuscular He* 04/27/2021 32.3 (!)   MCHC (Mean Corpuscular H* 04/27/2021 32.5    Platelet Count 04/27/2021 275    RDW-CV (Red Cell Distrib* 04/27/2021 12.4    MPV (Mean Platelet Volum* 04/27/2021 10.3    Neutrophils 04/27/2021 4.60    Lymphocytes 04/27/2021 1.30    Mixed Count 04/27/2021 0.70    Neutrophil % 04/27/2021 71.0 (!)   Lymphocyte % 04/27/2021 19.0    Mixed % 04/27/2021 10.0    Glucose 04/27/2021 85    Sodium 04/27/2021 141    Potassium 04/27/2021 3.3 (!)   Chloride 04/27/2021 101    Carbon Dioxide (CO2) 04/27/2021 35.6 (!)   Urea Nitrogen (BUN) 04/27/2021 30 (!)   Creatinine 04/27/2021 1.6 (!)   Glomerular Filtration  Ra* 04/27/2021 41 (!)   Calcium 04/27/2021 9.0    AST  04/27/2021 22    ALT  04/27/2021 17    Alk Phos (alkaline Phosp* 04/27/2021 57    Albumin 04/27/2021 3.7    Bilirubin, Total 04/27/2021 0.4    Protein, Total 04/27/2021 6.6    A/G Ratio 04/27/2021 1.3    Cholesterol, Total 04/27/2021 157    Triglyceride 04/27/2021 102    HDL (High Density Lipopr* 04/27/2021 61.8    LDL Calculated 04/27/2021 75    VLDL Cholesterol 04/27/2021 20    Cholesterol/HDL Ratio 04/27/2021 2.5    Thyroid Stimulating Horm* 04/27/2021 1.709    Vitamin B12  04/27/2021 979    Color 04/27/2021 Yellow    Clarity 04/27/2021 Clear    Specific Gravity 04/27/2021 1.010    pH, Urine 04/27/2021 7.0    Protein, Urinalysis 04/27/2021 Trace    Glucose, Urinalysis 04/27/2021 Negative    Ketones, Urinalysis 04/27/2021 Negative    Blood, Urinalysis 04/27/2021 Negative    Nitrite, Urinalysis 04/27/2021 Negative    Leukocyte Esterase, Urin* 04/27/2021 Negative    White Blood Cells, Urina* 04/27/2021 None Seen    Red Blood Cells, Urinaly* 04/27/2021 None Seen    Bacteria, Urinalysis 04/27/2021 None Seen    Squamous Epithelial Cell* 04/27/2021 Rare       ASSESSMENT: Mr. Consiglio is a 88 y.o. male presenting for reevaluation and surgery coordination for incarcerated incisional hernia.     The patient presents again with a symptomatic incarcerated incisional hernia. Patient was oriented about the diagnosis of incisional hernia and its implication. The patient was oriented about the treatment alternatives (observation vs surgical repair). Due to patient symptoms, repair is recommended. Patient oriented about the surgical procedure, the use of mesh and its risk of complications such as: infection, bleeding, injury to vasculature, injury to bowel or bladder, and chronic pain, intestinal obstruction, among others.    Due to previous incisional hernia repair with mesh in the past and the fact that patient has significant pain I recommend to proceed with CT scan of the abdomen appropriate for complete evaluation of complex incisional hernia.    CT scan shows a 3 cm incarcerated fat incisional hernia.  No sign of obstruction.  I discussed with the patient that we can proceed with minimally invasive robotic incisional hernia repair.   Incisional hernia, without obstruction or gangrene [K43.2]   PLAN: 2.  Robotic assisted laparoscopic incisional hernia repair with mesh (03559) 3.  Cardiac clearance 4.  CBC, CMP 5.  Avoid taking aspirin 5 days before the surgery 4.   Contact us if you have any concern   Patient and his wife verbalized understanding, all questions were answered, and were agreeable with the plan outlined above.      Herbert Pun, MD   Electronically signed by Herbert Pun, MD

## 2021-10-26 ENCOUNTER — Other Ambulatory Visit: Payer: Self-pay

## 2021-10-26 ENCOUNTER — Telehealth: Payer: Self-pay | Admitting: *Deleted

## 2021-10-26 ENCOUNTER — Encounter
Admission: RE | Admit: 2021-10-26 | Discharge: 2021-10-26 | Disposition: A | Discharge status: Home / Self Care | Payer: Medicare Other | Source: Ambulatory Visit | Attending: General Surgery | Admitting: General Surgery

## 2021-10-26 NOTE — Telephone Encounter (Signed)
Left message to call back and ask to speak with pre-op team.   Darreld Mclean, PA-C 10/26/2021 12:49 PM

## 2021-10-26 NOTE — Patient Instructions (Addendum)
Your procedure is scheduled on: Monday, February 20 Report to the Registration Desk on the 1st floor of the [facility]. To find out your arrival time, please call [phone] between 1PM - 3PM on: Friday, February 17  REMEMBER: Instructions that are not followed completely may result in serious medical risk, up to and including death; or upon the discretion of your surgeon and anesthesiologist your surgery may need to be rescheduled.  Do not eat food after midnight the night before surgery.  No gum chewing, lozengers or hard candies.  You may however, drink CLEAR liquids up to 2 hours before you are scheduled to arrive for your surgery. Do not drink anything within 2 hours of your scheduled arrival time.  Clear liquids include: - water  - apple juice without pulp - gatorade (not RED, PURPLE, OR BLUE) - black coffee or tea (Do NOT add milk or creamers to the coffee or tea) Do NOT drink anything that is not on this list.  TAKE THESE MEDICATIONS THE MORNING OF SURGERY WITH A SIP OF WATER:  Amiodarone Diltiazem Omeprazole - (take one the night before and one on the morning of surgery - helps to prevent nausea after surgery.) Tylenol or Oxycodone if needed for pain  One week prior to surgery: starting February 13 Stop Anti-inflammatories (NSAIDS) such as Advil, Aleve, Ibuprofen, Motrin, Naproxen, Naprosyn and Aspirin based products such as Excedrin, Goodys Powder, BC Powder. Stop ANY OVER THE COUNTER supplements until after surgery. Stop vitamin A, zinc, multiple vitamin You may however, continue to take Tylenol if needed for pain up until the day of surgery.  No Alcohol for 24 hours before or after surgery.  No Smoking including e-cigarettes for 24 hours prior to surgery.  No chewable tobacco products for at least 6 hours prior to surgery.  No nicotine patches on the day of surgery.  Do not use any "recreational" drugs for at least a week prior to your surgery.  Please be  advised that the combination of cocaine and anesthesia may have negative outcomes, up to and including death. If you test positive for cocaine, your surgery will be cancelled.  On the morning of surgery brush your teeth with toothpaste and water, you may rinse your mouth with mouthwash if you wish. Do not swallow any toothpaste or mouthwash.  Use CHG Soap as directed on instruction sheet.  Do not wear jewelry, make-up, hairpins, clips or nail polish.  Do not wear lotions, powders, or perfumes.   Do not shave body from the neck down 48 hours prior to surgery just in case you cut yourself which could leave a site for infection.  Also, freshly shaved skin may become irritated if using the CHG soap.  Contact lenses, hearing aids and dentures may not be worn into surgery.  Do not bring valuables to the hospital. [facility] is not responsible for any missing/lost belongings or valuables.   Notify your doctor if there is any change in your medical condition (cold, fever, infection).  Wear comfortable clothing (specific to your surgery type) to the hospital.  After surgery, you can help prevent lung complications by doing breathing exercises.  Take deep breaths and cough every 1-2 hours. Your doctor may order a device called an Incentive Spirometer to help you take deep breaths. When coughing or sneezing, hold a pillow firmly against your incision with both hands. This is called splinting. Doing this helps protect your incision. It also decreases belly discomfort.  If you are being  discharged the day of surgery, you will not be allowed to drive home. You will need a responsible adult (18 years or older) to drive you home and stay with you that night.   If you are taking public transportation, you will need to have a responsible adult (18 years or older) with you. Please confirm with your physician that it is acceptable to use public transportation.   Please call the [facility]  Dept. at [phone] if you have any questions about these instructions.  Surgery Visitation Policy:  Patients undergoing a surgery or procedure may have one family member or support person with them as long as that person is not COVID-19 positive or experiencing its symptoms.  That person may remain in the waiting area during the procedure and may rotate out with other people.

## 2021-10-26 NOTE — Telephone Encounter (Signed)
Follow Up:    Patient is returning call to Pre-Op.

## 2021-10-26 NOTE — Telephone Encounter (Signed)
Request for pre-operative cardiac clearance Received: Today Karen Kitchens, NP  P Cv Div Preop Callback Request for pre-operative cardiac clearance:     1. What type of surgery is being performed?  XI ROBOTIC ASSISTED VENTRAL HERNIA  ---previously cleared for this procedure back in 06/2021, however surgery has been postponed a couple of times now. Given his history and the length of time that it has been since clearance was issued, updated cardiology clearance being sought.   2. When is this surgery scheduled?  11/01/2021     3. Type of clearance being requested (medical, pharmacy, both).  Medical     4. Are there any medications that need to be held prior to surgery?  None   5. Practice name and name of physician performing surgery?  Performing surgeon: Dr. Windell Moment, MD  Requesting clearance: Honor Loh, FNP-C       6. Anesthesia type (none, local, MAC, general)?  GENERAL   7. What is the office phone and fax number?    Phone: [phone]  Fax: [phone]   ATTENTION: Unable to create telephone message as per your standard workflow. Directed by [facility] providers to send requests for cardiac clearance to this pool for appropriate distribution to provider covering pre-operative clearances.   Honor Loh, MSN, APRN, FNP-C, CEN  [facility]  Peri-operative Services Nurse Practitioner  Phone: [phone]  10/26/21 11:08 AM

## 2021-10-27 NOTE — Telephone Encounter (Addendum)
° °  Patient Name: Michael [name]  DOB: 1931-10-19 MRN: [id number]  Primary Cardiologist: Ida Rogue, MD  Chart reviewed as part of pre-operative protocol coverage. Patient was last seen by Dr. Rockey Situ on 09/2021 at which time note states the patient continues to have runs of SVT even on diltiazem120mg  daily. Also has history of 3v coronary calcification, aortic atherosclerosis, amongst other history per note. Remote echo 2015 with reported normal LV systolic function, mild MR, frequent PVCs. Pior nuc 04/2017 felt low risk. At recent visit, given continued issues with tachycardia, Dr. Rockey Situ stopped flecainide and changed him to amiodarone taper. Returned patient call but did not get any answer. At baseline chart indicates patient has chronic back pain and is very sedentary so likely unable to clear based on METS. Addendum: patient's wife returned call (DPR) and assisted with call. She confirms patient does not formally exercise but does walk out to get the mail or feed the deer in the yard without any cardiac symptoms. She overall feels he is doing OK, no specific complaints since switching to amiodarone.  Given issues with tachycardia requiring med adjustment 09/2021 and sedentary patient, will route to Dr. Rockey Situ for:  1) input on clearing patient for hernia repair under general anesthesia given inability to clear by METS and recent tachycardia issues  2) wife also wanted to relay that she is very concerned about drug interaction between donepezil and amiodarone (see phone note 09/22/21 from [facility] review with pharmD regarding QT prolongation/bradycardia and need for closer EKG monitoring). She wants Dr. Donivan Scull input on his.  Dr. Rockey Situ - Please route response to P CV DIV PREOP (the pre-op pool). Thank you.   Charlie Pitter, PA-C 10/27/2021, 8:43 AM

## 2021-10-28 NOTE — Progress Notes (Signed)
Perioperative Services  Pre-Admission/Anesthesia Testing Clinical Review  Date: 10/29/21  Patient Demographics:  Name: Michael [name] DOB:   1932-08-19 MRN:   [id number]  Planned Surgical Procedure(s):    Case: [id number] Date/Time: 11/01/21 1017   Procedure: XI ROBOTIC ASSISTED VENTRAL HERNIA (Abdomen)   Anesthesia type: General   Pre-op diagnosis: K43.2 Incisional hernia w/o obstruction or gangrene   Location: [facility] OR ROOM 04 / [facility]   Surgeons: Herbert Pun, MD   NOTE: Available PAT nursing documentation and vital signs have been reviewed. Clinical nursing staff has updated patient's PMH/PSHx, current medication list, and drug allergies/intolerances to ensure comprehensive history available to assist in medical decision making as it pertains to the aforementioned surgical procedure and anticipated anesthetic course. Extensive review of available clinical information performed. [place] PMH and PSHx updated with any diagnoses/procedures that  may have been inadvertently omitted during his intake with the pre-admission testing department's nursing staff.  Clinical Discussion:  Michael [name] is a 88 y.o. male who is submitted for pre-surgical anesthesia review and clearance prior to him undergoing the above procedure. Patient has never been a smoker. Pertinent PMH includes: CAD, PSVT, diastolic dysfunction, 1st degree AVB, HTN, HLD, aortic atherosclerosis, CKD-III, GERD (on daily PPI), hiatal hernia, anemia, BPH, OA, chronic back pain, Lewy body dementia, REM sleep behavior disorder, HOH (requires hearing aides).   Patient is followed by cardiology Rockey Situ, MD). He was last seen in the cardiology clinic on 09/21/2021; notes reviewed. At the time of his clinic visit, the patient denied any chest pain, shortness of breath, PND, orthopnea, palpitations, or presyncope/syncope. Patient with chronic peripheral edema that was reported to be at baseline. He also  reported intermittent episodes of tachycardia that occurred about "once a week" lasting for only a "few minutes" at a time before resolving. Patient with a PMH significant for cardiovascular diagnoses.  Long-term cardiac event monitor on in 04/2014 revealed frequent episodes of SVT.  He was referred to electrophysiology with plan to RCA, however patient ultimately declined the procedure. In the absence of the recommended cardiac ablation, patient has done well overall from a cardiac perspective.   Functional studies have been trended on this patient. TTE performed on 05/23/2014 revealed normal left ventricular systolic function with mild concentric LVH. LVEF 63%. Doppler parameters consistent with abnormal left ventricular relaxation (G1DD). There was trivial tricuspid and mild mitral valve regurgitation. Study revealed no evidence of a significant transvalvular gradient suggestive of stenosis.    TTE on  10/27/2014 demonstrated an LVEF of 61% the left atrium was mildly dilated.  Doppler parameters consistent with pseudonormalization (G2DD).   TTE on 04/10/2020 revealed a normal left ventricular systolic function with mild LVH; EF >55%. GLS -20.3%. There was trivial panvalvular regurgitation. No evidence of a significant transvalvular gradient suggestive of stenosis    Myocardial perfusion imaging study performed on 05/08/2017 revealed an LVEF of 55-65%. There was no ST segment deviation noted during stress. Of note, there was a small mild apical perfusion defect noted, however defect is fixed and has normal wall motion making noted defect more consistent with artifact as opposed to ischemia.   PSVT is managed with oral diltiazem + amiodarone. He was formerly on flecainide, however this was changed during his last visit due to patient being advised that it could cause QT prolongation by pharmacy at [facility]. [facility] recommended [facility]R placement, however patient declined. Additionally, patient has been  offered SVT ablation in the past, however he has  also refused this procedure. Blood pressure was mildly elevated at 140/68 on current diuretic and ARB therapies.  Patient is on a statin for his HLD.  He is not diabetic. Patient sedentary and deconditioned. Functional capacity, as defined by DASI, is felt to be </= 4 METS. No changes were made to patient's medication regimen. Patient to follow up with outpatient cardiology in 6 months or sooner if needed.  [name] is scheduled for an [place] on 11/01/2021 with Dr. Herbert Pun, MD. Given patient's past medical history significant for cardiovascular diagnoses, presurgical cardiac clearance was sought by the performing surgeon's office and PAT team. Per cardiology, "based ACC/AHA guidelines, the patient's past medical history, and the amount of time since his last clinic visit, this patient would be at an overall ACCEPTABLE risk for the planned procedure without further cardiovascular testing or intervention at this time". In review of his medication reconciliation, it is noted that this patient is not currently taking any type of anticoagulation/antiplatelet therapies that would need to be held during his perioperative course.  Patient denies previous perioperative complications with anesthesia in the past. In review of the available records, it is noted that patient underwent a neuraxial anesthetic course at [facility] (ASA II) in 03/2020 without documented complications.   Vitals with BMI 10/26/2021 09/21/2021 07/09/2021  Height _0  _1  -  Weight 190 lbs 197 lbs 4 oz -  BMI 15.40 08.67 -  Systolic - 619 509  Diastolic - 68 80  Pulse - 61 64    Providers/Specialists:   NOTE: Primary physician provider listed below. Patient may have been seen by APP or partner within same practice.   PROVIDER ROLE / SPECIALTY LAST Tanna Savoy, MD General Surgery  10/21/2021  Juluis Pitch, MD  Primary Care Provider  06/28/2021  Ida Rogue, MD Cardiology  09/21/2021  Christell Constant, MD Neurology 04/14/2021   Allergies:  Patient has no known allergies.  Current Home Medications:   No current facility-administered medications for this encounter.    acetaminophen (TYLENOL) 325 MG tablet   amiodarone (PACERONE) 200 MG tablet   camphor-menthol (SARNA) lotion   chlorthalidone (HYGROTON) 25 MG tablet   cholecalciferol (VITAMIN D3) 25 MCG (1000 UNIT) tablet   diltiazem (CARDIZEM) 120 MG tablet   donepezil (ARICEPT) 10 MG tablet   losartan (COZAAR) 50 MG tablet   lovastatin (MEVACOR) 20 MG tablet   mirtazapine (REMERON) 15 MG tablet   Multiple Vitamins-Minerals (CENTRUM SILVER ADULT 50+ PO)   omeprazole (PRILOSEC) 20 MG capsule   oxyCODONE-acetaminophen (PERCOCET) 10-325 MG tablet   Polyethyl Glycol-Propyl Glycol (SYSTANE OP)   potassium chloride (KLOR-CON M) 10 MEQ tablet   traZODone (DESYREL) 50 MG tablet   Vitamin A 3 MG (10000 UT) TABS   zinc gluconate 50 MG tablet   History:   Past Medical History:  Diagnosis Date   Anemia    Aortic atherosclerosis (HCC)    Arthritis    Aspiration pneumonia ([place]) 2020   BPH (benign prostatic hypertrophy)    CAD (coronary artery disease) 05/23/2016   a.) CT 05/23/2016: extensive 3v CAD.   Cellulitis of face 12/21/2019   Chronic back pain    CKD (chronic kidney disease), stage III ([place])    Colon cancer ([place]) 1988   Surgery alone, no history of chemo   Diastolic dysfunction 32/67/1245   a.) TTE 05/23/2014; EF 63%; G1DD. b.) TTE 10/27/2014: EF 61%, mild LA dilation; G2DD. c.) TTE 04/10/2020:  EF >55%. GLS -20.3%. Triv panvalvular regurgitation; no stenosis.   Diverticulosis    Essential hypertension    First degree AV block    GERD (gastroesophageal reflux disease)    H/O calcium pyrophosphate deposition disease (CPPD)    Hiatal hernia 01/06/2015   History of kidney stones    HLD (hyperlipidemia)    HOH (hard of hearing)     bilateral hearing aids   Hypokalemia    Impotence of organic origin    Incisional hernia 10/2021   Lewy body dementia ([place])    Peritonsillar abscess determined by examination 05/15/2019   S/p abx, I&D by ENT 05/2019   PSVT (paroxysmal supraventricular tachycardia) ([place])    a. 04/2014 Event Monitor: Freq bouts of SVT with rates to 180-->seen by EP with recommendation for RFCA, pt deferred;  b. 05/2014 Echo: EF nl, mild conc LVH, no rwma, Gr1 DD, mildMR/TR.   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   Rotator cuff rupture    Right; s/p repair   Unspecified hearing loss    Ventral hernia 1988   Past Surgical History:  Procedure Laterality Date   CATARACT EXTRACTION W/ INTRAOCULAR LENS IMPLANT Bilateral    COLONOSCOPY     HEMICOLOECTOMY W/ ANASTOMOSIS  1988   [name]   INGUINAL HERNIA REPAIR Right 1963   MASTECTOMY, PARTIAL Left 02/07/2013   LEFT SUBCUTANEOUS MASTECTOMY; Imogene Burn. Georgette Dover, MD   ROTATOR CUFF REPAIR Right 2000    [facility]   TOTAL HIP ARTHROPLASTY Right 03/24/2020   Procedure: TOTAL HIP ARTHROPLASTY ANTERIOR APPROACH;  Surgeon: Paralee Cancel, MD;  Location: [facility] ORS;  Service: Orthopedics;  Laterality: Right;  70 mins   TOTAL KNEE ARTHROPLASTY  03/05/2012   Procedure: TOTAL KNEE ARTHROPLASTY;  Surgeon: Gearlean Alf, MD;  Location: [facility] ORS;  Service: Orthopedics;  Laterality: Left;   [place] [facility], x 4   VEIN LIGATION AND STRIPPING Right    Family History  Problem Relation Age of Onset   Osteoarthritis Father    Congestive Heart Failure Mother    Social History   Tobacco Use   Smoking status: Never   Smokeless tobacco: Never  Vaping Use   Vaping Use: Never used  Substance Use Topics   Alcohol use: Yes    Alcohol/week: 1.0 standard drink    Types: 1 Glasses of wine per week    Comment: Wine (1/2-1 glass 2x/week)   Drug use: No    Pertinent Clinical Results:  LABS:    Ref Range & Units 10/21/2021  WBC (White  Blood Cell Count) 4.1 - 10.2 103/uL 6.9   RBC (Red Blood Cell Count) 4.69 - 6.13 106/uL 3.41 Low    Hemoglobin 14.1 - 18.1 gm/dL 11.5 Low    Hematocrit 40.0 - 52.0 % 34.2 Low    MCV (Mean Corpuscular Volume) 80.0 - 100.0 fl 100.3 High    MCH (Mean Corpuscular Hemoglobin) 27.0 - 31.2 pg 33.7 High    MCHC (Mean Corpuscular Hemoglobin Concentration) 32.0 - 36.0 gm/dL 33.6   Platelet Count 150 - 450 103/uL 275   RDW-CV (Red Cell Distribution Width) 11.6 - 14.8 % 12.8   MPV (Mean Platelet Volume) 9.4 - 12.4 fl 10.6   Neutrophils 1.50 - 7.80 103/uL 4.75   Lymphocytes 1.00 - 3.60 103/uL 1.22   Monocytes 0.00 - 1.50 103/uL 0.77   Eosinophils 0.00 - 0.55 103/uL 0.11   Basophils 0.00 - 0.09 103/uL 0.04   Neutrophil % 32.0 -  70.0 % 68.7   Lymphocyte % 10.0 - 50.0 % 17.6   Monocyte % 4.0 - 13.0 % 11.1   Eosinophil % 1.0 - 5.0 % 1.6   Basophil% 0.0 - 2.0 % 0.6   Immature Granulocyte % <=0.7 % 0.4   Immature Granulocyte Count <=0.06 10^3/L 0.03   Resulting Agency  [facility]  Specimen Collected: 10/21/21 14:33 Last Resulted: 10/21/21 14:42  Received From: [place]  Result Received: 10/22/21 11:51    Ref Range & Units 10/21/2021  Glucose 70 - 110 mg/dL 95   Sodium 136 - 145 mmol/L 140   Potassium 3.6 - 5.1 mmol/L 3.8   Chloride 97 - 109 mmol/L 100   Carbon Dioxide (CO2) 22.0 - 32.0 mmol/L 33.8 High    Urea Nitrogen (BUN) 7 - 25 mg/dL 43 High    Creatinine 0.7 - 1.3 mg/dL 2.0 High    Glomerular Filtration Rate (eGFR), MDRD Estimate >60 mL/min/1.73sq m 32 Low    Calcium 8.7 - 10.3 mg/dL 9.1   AST  8 - 39 U/L 27   ALT  6 - 57 U/L 20   Alk Phos (alkaline Phosphatase) 34 - 104 U/L 67   Albumin 3.5 - 4.8 g/dL 4.1   Bilirubin, Total 0.3 - 1.2 mg/dL 0.6   Protein, Total 6.1 - 7.9 g/dL 6.7   A/G Ratio 1.0 - 5.0 gm/dL 1.6   Resulting Agency  [facility] - [facility]  Specimen Collected: 10/21/21 14:33 Last Resulted: 10/21/21 17:03  Received From:  [place]  Result Received: 10/22/21 11:51    ECG: Date: 09/21/2021 Time ECG obtained: 0916 AM Rate: 61 bpm Rhythm:  Sinus rhythm with first-degree AV block Axis (leads I and aVF): Normal Intervals: PR 210 ms. QRS 108 ms. QTc 465 ms. ST segment and T wave changes: No evidence of acute ST segment elevation or depression Comparison: Similar to previous tracing obtained on 01/12/2021   IMAGING / PROCEDURES: DG KNEE COMPLETE 4 VIEWS RIGHT performed on 07/09/2021  No visualized fracture of the RIGHT knee, however there is a moderate knee joint effusion, and the bones are diffusely undermineralized.  This limits assessment for subtle nondisplaced fracture.  If there is clinical concern for fracture, recommend further assessment with CT. Tricompartmental osteoarthritis with likely ossified intra-articular bodies posteriorly in the joint.  CT CHEST, ABDOMEN, PELVIS WITHOUT CONTRAST performed on 07/01/2021 Small midline supraumbilical ventral hernia containing fat BILATERAL hepatic and renal cyst BILATERAL punctate nephrolithiasis.  No evidence of hydronephrosis. No acute findings.  TRANSTHORACIC ECHOCARDIOGRAM performed on 04/10/2020 LVEF >55% Normal left ventricular systolic function with mild LVH Normal right ventricular systolic function Trivial pan valvular regurgitation No evidence of valvular stenosis Average  global longitudinal strain -20.3% No pericardial effusion  MYOCARDIAL PERFUSION IMAGING STUDY (LEXISCAN) performed on 05/08/2017 LVEF normal at 55 to 65% Normal left ventricular systolic function No ST segment deviation or T wave inversion noted during stress There is a small defect of mild severity present in the apex location.  The defect fixed and has normal wall motion, thus most likely associated with artifact Normal low risk study  Impression and Plan:  DORION PETILLO has been referred for pre-anesthesia review and clearance prior to him  undergoing the planned anesthetic and procedural courses. Available labs, pertinent testing, and imaging results were personally reviewed by me. This patient has been appropriately cleared by cardiology with an overall ACCEPTABLE risk of significant perioperative cardiovascular complications.  Based on  clinical review performed today (10/29/21), barring any significant acute changes in the patient's overall condition, it is anticipated that he will be able to proceed with the planned surgical intervention. Any acute changes in clinical condition may necessitate his procedure being postponed and/or cancelled. Patient will meet with anesthesia team (MD and/or CRNA) on the day of his procedure for preoperative evaluation/assessment. Questions regarding anesthetic course will be fielded at that time.   Pre-surgical instructions were reviewed with the patient during his PAT appointment and questions were fielded by PAT clinical staff. Patient was advised that if any questions or concerns arise prior to his procedure then he should return a call to PAT and/or his surgeon's office to discuss.  Honor Loh, MSN, APRN, FNP-C, CEN [facility]  Peri-operative Services Nurse Practitioner Phone: [phone] Fax: [phone] 10/29/21 12:17 PM  NOTE: This note has been prepared using Dragon dictation software. Despite my best ability to proofread, there is always the potential that unintentional transcriptional errors may still occur from this process.

## 2021-10-29 NOTE — Telephone Encounter (Signed)
° °  Name: Michael [name]  DOB: 1932/08/17  MRN: [id number]   Primary Cardiologist: Ida Rogue, MD  Chart reviewed as part of pre-operative protocol coverage. Patient was contacted 10/27/2021 in reference to pre-operative risk assessment for pending surgery as outlined below.  Michael [name] was last seen on 09/21/2021 by Dr. Rockey Situ.  Since that day, Michael [name] has been stable.  His primary cardiologist, Dr. Rockey Situ. Has reviewed the case, and deemed the patient acceptable risk for noncardiac surgery without need for further testing.   Therefore, based on ACC/AHA guidelines, the patient would be at acceptable risk for the planned procedure without further cardiovascular testing.   The patient was advised that if he develops new symptoms prior to surgery to contact our office to arrange for a follow-up visit, and he verbalized understanding.  I will route this recommendation to the requesting party via Epic fax function and remove from pre-op pool. Please call with questions.  Christell Faith, PA-C 10/29/2021, 8:24 AM

## 2021-10-31 MED ORDER — CEFAZOLIN SODIUM-DEXTROSE 2-4 GM/100ML-% IV SOLN
2.0000 g | INTRAVENOUS | Status: AC
  Administered 2021-11-01: 2 g via INTRAVENOUS

## 2021-10-31 MED ORDER — CHLORHEXIDINE GLUCONATE 0.12 % MT SOLN: 15.0000 mL | Freq: Once | OROMUCOSAL | Status: AC

## 2021-10-31 MED ORDER — ORAL CARE MOUTH RINSE: 15.0000 mL | Freq: Once | OROMUCOSAL | Status: AC

## 2021-10-31 MED ORDER — LACTATED RINGERS IV SOLN: INTRAVENOUS | Status: DC

## 2021-11-01 ENCOUNTER — Other Ambulatory Visit: Payer: Self-pay

## 2021-11-01 ENCOUNTER — Ambulatory Visit
Admission: RE | Admit: 2021-11-01 | Discharge: 2021-11-01 | Disposition: A | Discharge status: Home / Self Care | Payer: Medicare Other | Source: Ambulatory Visit | Attending: General Surgery | Admitting: General Surgery

## 2021-11-01 ENCOUNTER — Encounter
Admission: RE | Disposition: A | Discharge status: Home / Self Care | Payer: Self-pay | Source: Ambulatory Visit | Attending: General Surgery

## 2021-11-01 ENCOUNTER — Ambulatory Visit: Payer: Medicare Other | Admitting: Urgent Care

## 2021-11-01 ENCOUNTER — Encounter: Payer: Self-pay | Admitting: General Surgery

## 2021-11-01 DIAGNOSIS — E785 Hyperlipidemia, unspecified: Secondary | ICD-10-CM | POA: Insufficient documentation

## 2021-11-01 DIAGNOSIS — I471 Supraventricular tachycardia: Secondary | ICD-10-CM | POA: Insufficient documentation

## 2021-11-01 DIAGNOSIS — I700 Atherosclerosis of aorta: Secondary | ICD-10-CM | POA: Diagnosis not present

## 2021-11-01 DIAGNOSIS — N183 Chronic kidney disease, stage 3 unspecified: Secondary | ICD-10-CM | POA: Insufficient documentation

## 2021-11-01 DIAGNOSIS — H9190 Unspecified hearing loss, unspecified ear: Secondary | ICD-10-CM | POA: Diagnosis not present

## 2021-11-01 DIAGNOSIS — D649 Anemia, unspecified: Secondary | ICD-10-CM | POA: Insufficient documentation

## 2021-11-01 DIAGNOSIS — M25569 Pain in unspecified knee: Secondary | ICD-10-CM | POA: Insufficient documentation

## 2021-11-01 DIAGNOSIS — G3183 Dementia with Lewy bodies: Secondary | ICD-10-CM | POA: Insufficient documentation

## 2021-11-01 DIAGNOSIS — I2510 Atherosclerotic heart disease of native coronary artery without angina pectoris: Secondary | ICD-10-CM | POA: Diagnosis not present

## 2021-11-01 DIAGNOSIS — M1990 Unspecified osteoarthritis, unspecified site: Secondary | ICD-10-CM | POA: Diagnosis not present

## 2021-11-01 DIAGNOSIS — I129 Hypertensive chronic kidney disease with stage 1 through stage 4 chronic kidney disease, or unspecified chronic kidney disease: Secondary | ICD-10-CM | POA: Diagnosis not present

## 2021-11-01 DIAGNOSIS — K430 Incisional hernia with obstruction, without gangrene: Secondary | ICD-10-CM | POA: Insufficient documentation

## 2021-11-01 DIAGNOSIS — G4752 REM sleep behavior disorder: Secondary | ICD-10-CM | POA: Insufficient documentation

## 2021-11-01 DIAGNOSIS — K219 Gastro-esophageal reflux disease without esophagitis: Secondary | ICD-10-CM | POA: Insufficient documentation

## 2021-11-01 DIAGNOSIS — D759 Disease of blood and blood-forming organs, unspecified: Secondary | ICD-10-CM | POA: Insufficient documentation

## 2021-11-01 DIAGNOSIS — I440 Atrioventricular block, first degree: Secondary | ICD-10-CM | POA: Diagnosis not present

## 2021-11-01 DIAGNOSIS — K449 Diaphragmatic hernia without obstruction or gangrene: Secondary | ICD-10-CM | POA: Insufficient documentation

## 2021-11-01 DIAGNOSIS — F0280 Dementia in other diseases classified elsewhere without behavioral disturbance: Secondary | ICD-10-CM | POA: Diagnosis not present

## 2021-11-01 DIAGNOSIS — N400 Enlarged prostate without lower urinary tract symptoms: Secondary | ICD-10-CM | POA: Diagnosis not present

## 2021-11-01 DIAGNOSIS — K432 Incisional hernia without obstruction or gangrene: Secondary | ICD-10-CM | POA: Diagnosis not present

## 2021-11-01 HISTORY — PX: INSERTION OF MESH: SHX5868

## 2021-11-01 HISTORY — PX: XI ROBOTIC ASSISTED VENTRAL HERNIA: SHX6789

## 2021-11-01 SURGERY — REPAIR, HERNIA, VENTRAL, ROBOT-ASSISTED
Anesthesia: General | Site: Abdomen

## 2021-11-01 MED ORDER — DEXAMETHASONE SODIUM PHOSPHATE 10 MG/ML IJ SOLN
INTRAMUSCULAR | Status: AC
  Filled 2021-11-01: qty 2

## 2021-11-01 MED ORDER — FENTANYL CITRATE (PF) 100 MCG/2ML IJ SOLN
INTRAMUSCULAR | Status: AC
  Administered 2021-11-01: 50 ug via INTRAVENOUS
  Filled 2021-11-01: qty 2

## 2021-11-01 MED ORDER — OXYCODONE-ACETAMINOPHEN 5-325 MG PO TABS: 1.0000 | ORAL_TABLET | ORAL | 0 refills | Status: AC | PRN

## 2021-11-01 MED ORDER — SUGAMMADEX SODIUM 200 MG/2ML IV SOLN
INTRAVENOUS | Status: DC | PRN
Start: 2021-11-01 — End: 2021-11-01
  Administered 2021-11-01: 300 mg via INTRAVENOUS

## 2021-11-01 MED ORDER — OXYCODONE HCL 5 MG PO TABS
ORAL_TABLET | ORAL | Status: AC
  Filled 2021-11-01: qty 1

## 2021-11-01 MED ORDER — EPHEDRINE 5 MG/ML INJ
INTRAVENOUS | Status: AC
  Filled 2021-11-01: qty 5

## 2021-11-01 MED ORDER — DEXAMETHASONE SODIUM PHOSPHATE 10 MG/ML IJ SOLN
INTRAMUSCULAR | Status: DC | PRN
  Administered 2021-11-01: 10 mg via INTRAVENOUS

## 2021-11-01 MED ORDER — ONDANSETRON HCL 4 MG/2ML IJ SOLN
INTRAMUSCULAR | Status: DC | PRN
  Administered 2021-11-01: 4 mg via INTRAVENOUS

## 2021-11-01 MED ORDER — PROPOFOL 10 MG/ML IV BOLUS
INTRAVENOUS | Status: AC
  Filled 2021-11-01: qty 20

## 2021-11-01 MED ORDER — BUPIVACAINE-EPINEPHRINE (PF) 0.25% -1:200000 IJ SOLN
INTRAMUSCULAR | Status: DC | PRN
  Administered 2021-11-01: 10 mL
  Administered 2021-11-01: 30 mL

## 2021-11-01 MED ORDER — FENTANYL CITRATE (PF) 100 MCG/2ML IJ SOLN
INTRAMUSCULAR | Status: AC
  Filled 2021-11-01: qty 2

## 2021-11-01 MED ORDER — GLYCOPYRROLATE 0.2 MG/ML IJ SOLN
INTRAMUSCULAR | Status: DC | PRN
Start: 2021-11-01 — End: 2021-11-01
  Administered 2021-11-01: .2 mg via INTRAVENOUS

## 2021-11-01 MED ORDER — OXYCODONE HCL 5 MG PO TABS
5.0000 mg | ORAL_TABLET | Freq: Once | ORAL | Status: AC | PRN
  Administered 2021-11-01: 5 mg via ORAL

## 2021-11-01 MED ORDER — ACETAMINOPHEN 10 MG/ML IV SOLN
INTRAVENOUS | Status: AC
  Filled 2021-11-01: qty 100

## 2021-11-01 MED ORDER — DEXMEDETOMIDINE (PRECEDEX) IN NS 20 MCG/5ML (4 MCG/ML) IV SYRINGE
PREFILLED_SYRINGE | INTRAVENOUS | Status: DC | PRN
  Administered 2021-11-01: 4 ug via INTRAVENOUS

## 2021-11-01 MED ORDER — BUPIVACAINE-EPINEPHRINE (PF) 0.25% -1:200000 IJ SOLN
INTRAMUSCULAR | Status: AC
  Filled 2021-11-01: qty 30

## 2021-11-01 MED ORDER — ONDANSETRON HCL 4 MG/2ML IJ SOLN: 4.0000 mg | Freq: Once | INTRAMUSCULAR | Status: DC | PRN

## 2021-11-01 MED ORDER — CEFAZOLIN SODIUM-DEXTROSE 2-4 GM/100ML-% IV SOLN
INTRAVENOUS | Status: AC
  Filled 2021-11-01: qty 100

## 2021-11-01 MED ORDER — PHENYLEPHRINE 40 MCG/ML (10ML) SYRINGE FOR IV PUSH (FOR BLOOD PRESSURE SUPPORT)
PREFILLED_SYRINGE | INTRAVENOUS | Status: DC | PRN
  Administered 2021-11-01 (×6): 80 ug via INTRAVENOUS

## 2021-11-01 MED ORDER — FENTANYL CITRATE (PF) 100 MCG/2ML IJ SOLN
INTRAMUSCULAR | Status: DC | PRN
  Administered 2021-11-01 (×2): 50 ug via INTRAVENOUS

## 2021-11-01 MED ORDER — ACETAMINOPHEN 10 MG/ML IV SOLN: 1000.0000 mg | Freq: Once | INTRAVENOUS | Status: DC | PRN

## 2021-11-01 MED ORDER — LIDOCAINE HCL (CARDIAC) PF 100 MG/5ML IV SOSY
PREFILLED_SYRINGE | INTRAVENOUS | Status: DC | PRN
Start: 2021-11-01 — End: 2021-11-01
  Administered 2021-11-01: 100 mg via INTRAVENOUS

## 2021-11-01 MED ORDER — EPHEDRINE 5 MG/ML INJ
INTRAVENOUS | Status: AC
  Filled 2021-11-01: qty 10

## 2021-11-01 MED ORDER — EPHEDRINE SULFATE (PRESSORS) 50 MG/ML IJ SOLN
INTRAMUSCULAR | Status: DC | PRN
Start: 2021-11-01 — End: 2021-11-01
  Administered 2021-11-01 (×3): 5 mg via INTRAVENOUS

## 2021-11-01 MED ORDER — LACTATED RINGERS IV SOLN: INTRAVENOUS | Status: DC

## 2021-11-01 MED ORDER — OXYCODONE HCL 5 MG/5ML PO SOLN: 5.0000 mg | Freq: Once | ORAL | Status: AC | PRN

## 2021-11-01 MED ORDER — PROPOFOL 10 MG/ML IV BOLUS
INTRAVENOUS | Status: DC | PRN
  Administered 2021-11-01: 100 mg via INTRAVENOUS

## 2021-11-01 MED ORDER — ROCURONIUM BROMIDE 100 MG/10ML IV SOLN
INTRAVENOUS | Status: DC | PRN
Start: 2021-11-01 — End: 2021-11-01
  Administered 2021-11-01: 60 mg via INTRAVENOUS
  Administered 2021-11-01: 20 mg via INTRAVENOUS

## 2021-11-01 MED ORDER — DEXMEDETOMIDINE (PRECEDEX) IN NS 20 MCG/5ML (4 MCG/ML) IV SYRINGE
PREFILLED_SYRINGE | INTRAVENOUS | Status: AC
  Filled 2021-11-01: qty 15

## 2021-11-01 MED ORDER — CHLORHEXIDINE GLUCONATE 0.12 % MT SOLN
OROMUCOSAL | Status: AC
  Administered 2021-11-01: 15 mL via OROMUCOSAL
  Filled 2021-11-01: qty 15

## 2021-11-01 MED ORDER — ACETAMINOPHEN 10 MG/ML IV SOLN
INTRAVENOUS | Status: DC | PRN
  Administered 2021-11-01: 1000 mg via INTRAVENOUS

## 2021-11-01 MED ORDER — 0.9 % SODIUM CHLORIDE (POUR BTL) OPTIME
TOPICAL | Status: DC | PRN
  Administered 2021-11-01: 500 mL

## 2021-11-01 MED ORDER — FENTANYL CITRATE (PF) 100 MCG/2ML IJ SOLN
25.0000 ug | INTRAMUSCULAR | Status: DC | PRN
  Administered 2021-11-01: 50 ug via INTRAVENOUS

## 2021-11-01 SURGICAL SUPPLY — 47 items
ADH SKN CLS APL DERMABOND .7 (GAUZE/BANDAGES/DRESSINGS) ×1
BAG INFUSER PRESSURE 100CC (MISCELLANEOUS) IMPLANT
COVER TIP SHEARS 8 DVNC (MISCELLANEOUS) ×2 IMPLANT
COVER TIP SHEARS 8MM DA VINCI (MISCELLANEOUS) ×2
COVER WAND RF STERILE (DRAPES) ×3 IMPLANT
DERMABOND ADVANCED (GAUZE/BANDAGES/DRESSINGS) ×1
DERMABOND ADVANCED .7 DNX12 (GAUZE/BANDAGES/DRESSINGS) ×2 IMPLANT
DRAPE ARM DVNC X/XI (DISPOSABLE) ×6 IMPLANT
DRAPE COLUMN DVNC XI (DISPOSABLE) ×2 IMPLANT
DRAPE DA VINCI XI ARM (DISPOSABLE) ×6
DRAPE DA VINCI XI COLUMN (DISPOSABLE) ×2
ELECT REM PT RETURN 9FT ADLT (ELECTROSURGICAL) ×2
ELECTRODE REM PT RTRN 9FT ADLT (ELECTROSURGICAL) ×2 IMPLANT
GLOVE SURG ENC MOIS LTX SZ6.5 (GLOVE) ×6 IMPLANT
GLOVE SURG UNDER POLY LF SZ6.5 (GLOVE) ×6 IMPLANT
GOWN STRL REUS W/ TWL LRG LVL3 (GOWN DISPOSABLE) ×6 IMPLANT
GOWN STRL REUS W/TWL LRG LVL3 (GOWN DISPOSABLE) ×6
IRRIGATOR SUCT 8 DISP DVNC XI (IRRIGATION / IRRIGATOR) IMPLANT
IRRIGATOR SUCTION 8MM XI DISP (IRRIGATION / IRRIGATOR)
IV NS 1000ML (IV SOLUTION)
IV NS 1000ML BAXH (IV SOLUTION) IMPLANT
KIT PINK PAD W/HEAD ARE REST (MISCELLANEOUS) ×2
KIT PINK PAD W/HEAD ARM REST (MISCELLANEOUS) ×2 IMPLANT
LABEL OR SOLS (LABEL) ×3 IMPLANT
MANIFOLD NEPTUNE II (INSTRUMENTS) ×3 IMPLANT
MESH VENTRALIGHT ST 4X6IN (Mesh General) ×1 IMPLANT
NDL INSUFFLATION 14GA 120MM (NEEDLE) ×2 IMPLANT
NEEDLE HYPO 22GX1.5 SAFETY (NEEDLE) ×3 IMPLANT
NEEDLE INSUFFLATION 14GA 120MM (NEEDLE) ×2 IMPLANT
OBTURATOR OPTICAL STANDARD 8MM (TROCAR) ×2
OBTURATOR OPTICAL STND 8 DVNC (TROCAR) ×1
OBTURATOR OPTICALSTD 8 DVNC (TROCAR) ×2 IMPLANT
PACK LAP CHOLECYSTECTOMY (MISCELLANEOUS) ×3 IMPLANT
SEAL CANN UNIV 5-8 DVNC XI (MISCELLANEOUS) ×6 IMPLANT
SEAL XI 5MM-8MM UNIVERSAL (MISCELLANEOUS) ×6
SET TUBE SMOKE EVAC HIGH FLOW (TUBING) ×3 IMPLANT
SOLUTION ELECTROLUBE (MISCELLANEOUS) ×3 IMPLANT
SUT MNCRL 4-0 (SUTURE) ×4
SUT MNCRL 4-0 27XMFL (SUTURE) ×2
SUT MNCRL AB 4-0 PS2 18 (SUTURE) ×2 IMPLANT
SUT STRATAFIX PDS 30 CT-1 (SUTURE) ×4 IMPLANT
SUT V-LOC 90 ABS 3-0 VLT  V-20 (SUTURE) ×4
SUT V-LOC 90 ABS 3-0 VLT V-20 (SUTURE) IMPLANT
SUTURE MNCRL 4-0 27XMF (SUTURE) IMPLANT
TAPE TRANSPORE STRL 2 31045 (GAUZE/BANDAGES/DRESSINGS) ×3 IMPLANT
TRAY FOLEY MTR SLVR 16FR STAT (SET/KITS/TRAYS/PACK) IMPLANT
WATER STERILE IRR 500ML POUR (IV SOLUTION) ×3 IMPLANT

## 2021-11-01 NOTE — Anesthesia Postprocedure Evaluation (Signed)
Anesthesia Post Note  Patient: Michael [name]  Procedure(s) Performed: XI ROBOTIC ASSISTED VENTRAL HERNIA (Abdomen) INSERTION OF MESH (Abdomen)  Patient location during evaluation: PACU Anesthesia Type: General Level of consciousness: awake and alert Pain management: pain level controlled Vital Signs Assessment: post-procedure vital signs reviewed and stable Respiratory status: spontaneous breathing, nonlabored ventilation, respiratory function stable and patient connected to nasal cannula oxygen Cardiovascular status: blood pressure returned to baseline and stable Postop Assessment: no apparent nausea or vomiting Anesthetic complications: no   No notable events documented.   Last Vitals:  Vitals:   11/01/21 1211 11/01/21 1215  BP: (!) 117/57 (!) 116/54  Pulse: 71 71  Resp: 20 17  Temp: (!) 36 C   SpO2: 96% 96%    Last Pain:  Vitals:   11/01/21 1215  TempSrc:   PainSc: 8                  Arita Miss

## 2021-11-01 NOTE — Anesthesia Preprocedure Evaluation (Addendum)
Anesthesia Evaluation  Patient identified by MRN, date of birth, ID band Patient awake  General Assessment Comment:  Patient extremely hard of hearing, even with hearing aids in. With loud enough shouting, patient able to comprehend and carry on a conversation, able to provide informed consent for this procedure in my professional opinion. AOx3  Reviewed: Allergy & Precautions, NPO status , Patient's Chart, lab work & pertinent test results  History of Anesthesia Complications Negative for: history of anesthetic complications  Airway Mallampati: II  TM Distance: >3 FB Neck ROM: Full    Dental no notable dental hx. (+) Teeth Intact   Pulmonary neg pulmonary ROS, neg sleep apnea, neg COPD, Patient abstained from smoking.Not current smoker,    Pulmonary exam normal breath sounds clear to auscultation       Cardiovascular Exercise Tolerance: Good METShypertension, + CAD  (-) Past MI + dysrhythmias Supra Ventricular Tachycardia  Rhythm:Regular Rate:Normal - Systolic murmurs ECG 05/10/92: SR with 1st degree AVB  Echo 04/10/20:  NORMAL LEFT VENTRICULAR SYSTOLIC FUNCTION WITH MILD LVH  NORMAL LA PRESSURES WITH NORMAL DIASTOLIC FUNCTION  NORMAL RIGHT VENTRICULAR SYSTOLIC FUNCTION  VALVULAR REGURGITATION: TRIVIAL AR, TRIVIAL MR, TRIVIAL PR, TRIVIAL TR  NO VALVULAR STENOSIS  NORMAL GLOBAL LONGITUDINAL STRAIN 20.3%  THERE IS A LARGE LIVER LESION (4.0 CM X3.0 CM). THE LESION SUGGESTIVE OF  LIVER CYST. DEDICATED IMAGES RECOMENDED IF CLINICALLY INDICATED.   Myocardial perfusion 05/08/17:  - There was no ST segment deviation noted during stress. - No T wave inversion was noted during stress. - Defect 1: There is a small defect of mild severity present in the apex location. This defect is fixed and has normal wall motion and thus most likely is an artifact. - The study is normal. - This is a low risk study. - The left  ventricular ejection fraction is normal (55-65%).   Neuro/Psych PSYCHIATRIC DISORDERS Dementia HOH negative neurological ROS     GI/Hepatic hiatal hernia, GERD  ,(+)     (-) substance abuse  , Colon CA   Endo/Other  neg diabetes  Renal/GU Renal disease (stage III CKD, nephrolithiasis)   BPH    Musculoskeletal  (+) Arthritis ,   Abdominal   Peds  Hematology  (+) Blood dyscrasia, anemia ,   Anesthesia Other Findings Reviewed and agree with Bayard Males pre-anesthesia clinical review note.  Cardiology note 09/21/21:  ASSESSMENT AND PLAN:  SVT (supraventricular tachycardia) (HCC) - Rare episodes paroxysmal tachycardia, once a week he reports lasting less than a minute Of note he does have coronary disease on CT scan Will hold the flecainide, transition to amiodarone 200 twice daily for 2 weeks then 200 daily continue diltiazem twice daily Previously declined ablation No indication for loop at this time For worsening paroxysmal tachycardia recommended he take extra amiodarone  Coronary artery disease involving native coronary artery of native heart without angina pectoris Heavy coronary calcification seen on CT scan Low risk stress test August 2018 Cholesterol was above goal on last check with primary care, wife reports that she has stopped the lovastatin Recommend he restart lovastatin 20, if still not at goal may need to add Zetia  Aortic atherosclerosis ([place]) Moderate diffuse disease on CT scan September 2017 Restart lovastatin as above  Essential hypertension Blood pressure is well controlled on today's visit. No changes made to the medications. May need to increase fluids, creatinine elevated on chlorthalidone  Reproductive/Obstetrics  Anesthesia Physical Anesthesia Plan  ASA: 3  Anesthesia Plan: General   Post-op Pain Management: Ofirmev IV (intra-op)*   Induction: Intravenous  PONV Risk Score  and Plan: 4 or greater and Ondansetron, Dexamethasone and Treatment may vary due to age or medical condition  Airway Management Planned: Oral ETT  Additional Equipment: None  Intra-op Plan:   Post-operative Plan: Extubation in OR  Informed Consent: I have reviewed the patients History and Physical, chart, labs and discussed the procedure including the risks, benefits and alternatives for the proposed anesthesia with the patient or authorized representative who has indicated his/her understanding and acceptance.     Dental advisory given  Plan Discussed with: CRNA and Surgeon  Anesthesia Plan Comments: (Discussed risks of anesthesia with patient, including PONV, sore throat, lip/dental/eye damage. Rare risks discussed as well, such as cardiorespiratory and neurological sequelae, and allergic reactions. Discussed the role of CRNA in patient's perioperative care. Patient understands.)        Anesthesia Quick Evaluation

## 2021-11-01 NOTE — Anesthesia Procedure Notes (Signed)
Procedure Name: Intubation Date/Time: 11/01/2021 10:27 AM Performed by: Lily Peer, Jayel Inks, CRNA Pre-anesthesia Checklist: Patient identified, Emergency Drugs available, Suction available and Patient being monitored Patient Re-evaluated:Patient Re-evaluated prior to induction Oxygen Delivery Method: Circle system utilized Preoxygenation: Pre-oxygenation with 100% oxygen Induction Type: IV induction Ventilation: Mask ventilation without difficulty Laryngoscope Size: McGraph and 4 Grade View: Grade I Tube type: Oral Tube size: 7.0 mm Number of attempts: 1 Airway Equipment and Method: Stylet Placement Confirmation: ETT inserted through vocal cords under direct vision, positive ETCO2 and breath sounds checked- equal and bilateral Secured at: 22 cm Tube secured with: Tape Dental Injury: Teeth and Oropharynx as per pre-operative assessment

## 2021-11-01 NOTE — Op Note (Signed)
Preoperative diagnosis: Incisional Hernia  Postoperative diagnosis: Incisional Hernia  Procedure: Robotic assisted laparoscopic incisional hernia repair with mesh Removal of mesh  Anesthesia: General  Surgeon: Dr. Windell Moment  Wound Classification: Clean  Specimen: Mesh  Complications: None  Estimated Blood Loss: 44ml  Indications: Patient is a 88 y.o. male developed a incisional hernia. This was symptomatic and incarcerated and repair was indicated.   Findings: Swiss cheese incisional hernia up to 6 cm  2. Repair achieved with closure of the anterior fascia at midline and 10 x 15 cm Bard mesh 3. Previous mesh needed to be removed to be able to reduce the hernia 4. Adequate hemostasis               Description of procedure: The patient was brought to the operating room and general anesthesia was induced. A time-out was completed verifying correct patient, procedure, site, positioning, and implant(s) and/or special equipment prior to beginning this procedure. Antibiotics were administered prior to making the incision. SCDs placed. The anterior abdominal wall was prepped and draped in the standard sterile fashion.   Palmer's point chosen for entry.  Veress needle placed and abdomen insufflated to 15cm without any dramatic increase in pressure.  Needle removed and optiview technique used to place 79mm port at same point.  No injury noted during placement.  Two additional ports, 31mm x2 along left lateral aspect placed.  Xi robot then docked into place.  Hernia contents noted and reduced with combination of blunt, sharp dissection with scissors and fenestrated forceps.  Hemostasis achieved throughout this portion.  Once all hernia contents reduced, there was noted to be a 6 cm hernia.  The old mesh was needed to be removed to be able to reduce one of the multiple hernia defects.   Insufflation dropped to 35mm and transfacial suture with 0 stratafix used to primarily close  defect under minimal tension. Bard protected mesh was placed within the abdominal cavity and secured to the abdominal wall centered over the defect using the 0 stratafix previously used to primarily close defect.  The mesh was then circumferentially sutured into the anterior abdominal wall using 2-0 VLock x2.  Any bleeding noted during this portion was no longer actively bleeding by end of securing mesh and tightening the suture.    Robot was undocked.  Abdomen then desufflated while camera within abdomen to ensure no signs of new bleed prior to removing camera and rest of ports completely.  All skin incisions closed with runninrg 4-0 Monocryl in a subcuticular fashion.  All wounds then dressed with Dermabond.  Patient was then successfully awakened and transferred to PACU in stable condition.  At the end of the procedure sponge and instrument counts were correct.

## 2021-11-01 NOTE — Interval H&P Note (Signed)
History and Physical Interval Note:  11/01/2021 9:53 AM  Michael [name]  has presented today for surgery, with the diagnosis of K43.2 Incisional hernia w/o obstruction or gangrene.  The various methods of treatment have been discussed with the patient and family. After consideration of risks, benefits and other options for treatment, the patient has consented to  Procedure(s): XI ROBOTIC [place] (N/A) as a surgical intervention.  The patient's history has been reviewed, patient examined, no change in status, stable for surgery.  I have reviewed the patient's chart and labs.  Questions were answered to the patient's satisfaction.     Herbert Pun

## 2021-11-01 NOTE — Progress Notes (Signed)
MD ordered patient to be discharged home.  Patient had quite a bit of pain when arriving to post-up, oxycodone was given.  Pain was relieved a little and only hurt when moving.  Discharge instructions were reviewed with the patient and wife and they voiced understanding.  Follow-up appointment was made.  Prescriptions sent to the patients pharmacy.  IV was removed with catheter intact.  All questions were answered.  Patient left via wheelchair.

## 2021-11-01 NOTE — Discharge Instructions (Addendum)
°  Diet: Resume home heart healthy regular diet.   Activity: No heavy lifting >20 pounds (children, pets, laundry, garbage) or strenuous activity until follow-up, but light activity and walking are encouraged. Do not drive or drink alcohol if taking narcotic pain medications.  Wound care: May shower with soapy water and pat dry (do not rub incisions), but no baths or submerging incision underwater until follow-up. (no swimming)   Medications: Resume all home medications. For mild to moderate pain: acetaminophen (Tylenol) or ibuprofen (if no kidney disease). Combining Tylenol with alcohol can substantially increase your risk of causing liver disease. Narcotic pain medications, if prescribed, can be used for severe pain, though may cause nausea, constipation, and drowsiness. Do not combine Tylenol and Percocet within a 6 hour period as Percocet contains Tylenol. If you do not need the narcotic pain medication, you do not need to fill the prescription.  Call office [phone]) at any time if any questions, worsening pain, fevers/chills, bleeding, drainage from incision site, or other concerns. AMBULATORY SURGERY  DISCHARGE INSTRUCTIONS   The drugs that you were given will stay in your system until tomorrow so for the next 24 hours you should not:  Drive an automobile Make any legal decisions Drink any alcoholic beverage   You may resume regular meals tomorrow.  Today it is better to start with liquids and gradually work up to solid foods.  You may eat anything you prefer, but it is better to start with liquids, then soup and crackers, and gradually work up to solid foods.   Please notify your doctor immediately if you have any unusual bleeding, trouble breathing, redness and pain at the surgery site, drainage, fever, or pain not relieved by medication.    Additional Instructions:        Please contact your physician with any problems or Same Day Surgery at [phone], Monday  through Friday 6 am to 4 pm, or [place] [facility] number at [phone].

## 2021-11-01 NOTE — Transfer of Care (Signed)
Immediate Anesthesia Transfer of Care Note  Patient: Michael [name]  Procedure(s) Performed: XI ROBOTIC ASSISTED VENTRAL HERNIA (Abdomen) INSERTION OF MESH (Abdomen)  Patient Location: PACU  Anesthesia Type:General  Level of Consciousness: awake and alert   Airway & Oxygen Therapy: Patient Spontanous Breathing and Patient connected to face mask oxygen  Post-op Assessment: Report given to RN and Post -op Vital signs reviewed and stable  Post vital signs: Reviewed and stable  Last Vitals:  Vitals Value Taken Time  BP 117/57 11/01/21 1211  Temp    Pulse 71 11/01/21 1212  Resp 17 11/01/21 1214  SpO2 96 % 11/01/21 1212  Vitals shown include unvalidated device data.  Last Pain:  Vitals:   11/01/21 0929  TempSrc: Temporal  PainSc: 0-No pain         Complications: No notable events documented.

## 2021-11-02 ENCOUNTER — Encounter: Payer: Self-pay | Admitting: General Surgery

## 2021-11-02 LAB — SURGICAL PATHOLOGY

## 2021-11-09 DIAGNOSIS — N183 Chronic kidney disease, stage 3 unspecified: Secondary | ICD-10-CM | POA: Diagnosis not present

## 2021-11-09 DIAGNOSIS — E785 Hyperlipidemia, unspecified: Secondary | ICD-10-CM | POA: Diagnosis not present

## 2021-11-09 DIAGNOSIS — I471 Supraventricular tachycardia: Secondary | ICD-10-CM | POA: Diagnosis not present

## 2021-11-09 DIAGNOSIS — I129 Hypertensive chronic kidney disease with stage 1 through stage 4 chronic kidney disease, or unspecified chronic kidney disease: Secondary | ICD-10-CM | POA: Diagnosis not present

## 2021-11-09 DIAGNOSIS — I10 Essential (primary) hypertension: Secondary | ICD-10-CM | POA: Diagnosis not present

## 2021-12-03 DIAGNOSIS — E876 Hypokalemia: Secondary | ICD-10-CM | POA: Diagnosis not present

## 2021-12-06 ENCOUNTER — Other Ambulatory Visit: Payer: Self-pay

## 2021-12-06 ENCOUNTER — Encounter: Payer: Self-pay | Admitting: Family Medicine

## 2021-12-06 ENCOUNTER — Ambulatory Visit: Payer: Medicare Other | Admitting: Family Medicine

## 2021-12-06 VITALS — BP 136/76 | HR 62 | Ht 69.0 in | Wt 198.8 lb

## 2021-12-06 DIAGNOSIS — F02A0 Dementia in other diseases classified elsewhere, mild, without behavioral disturbance, psychotic disturbance, mood disturbance, and anxiety: Secondary | ICD-10-CM | POA: Diagnosis not present

## 2021-12-06 DIAGNOSIS — G3183 Dementia with Lewy bodies: Secondary | ICD-10-CM

## 2021-12-06 DIAGNOSIS — G4752 REM sleep behavior disorder: Secondary | ICD-10-CM | POA: Diagnosis not present

## 2021-12-06 DIAGNOSIS — R440 Auditory hallucinations: Secondary | ICD-10-CM

## 2021-12-06 DIAGNOSIS — H9193 Unspecified hearing loss, bilateral: Secondary | ICD-10-CM | POA: Diagnosis not present

## 2021-12-06 NOTE — Patient Instructions (Addendum)
Below is our plan: ? ?We will continue donepezil '5mg'$  at bedtime for now. Consider memantine '5mg'$  twice daily. I have included information in your summary, today. You can call me if you wish to try this medicine. I will send my note to Dr Thayer Jew to let him know that I would support his thoughts of trying Lunesta at the lowest available dose. We have reviewed pros and cons of using Lunesta for sleep aid.  ? ?Please make sure you are staying well hydrated. I recommend 50-60 ounces daily. Well balanced diet and regular exercise encouraged. Consistent sleep schedule with 6-8 hours recommended.  ? ?Please continue follow up with care team as directed.  ? ?Follow up with neurology in 6 months  ? ?You may receive a survey regarding today's visit. I encourage you to leave honest feed back as I do use this information to improve patient care. Thank you for seeing me today!  ? ?Management of Memory Problems ?  ?There are some general things you can do to help manage your memory problems.  Your memory may not in fact recover, but by using techniques and strategies you will be able to manage your memory difficulties better. ?  ?1)  Establish a routine. ?Try to establish and then stick to a regular routine.  By doing this, you will get used to what to expect and you will reduce the need to rely on your memory.  Also, try to do things at the same time of day, such as taking your medication or checking your calendar first thing in the morning. ?Think about think that you can do as a part of a regular routine and make a list.  Then enter them into a daily planner to remind you.  This will help you establish a routine. ?  ?2)  Organize your environment. ?Organize your environment so that it is uncluttered.  Decrease visual stimulation.  Place everyday items such as keys or cell phone in the same place every day (ie.  Basket next to front door) ?Use post it notes with a brief message to yourself (ie. Turn off light, lock the  door) ?Use labels to indicate where things go (ie. Which cupboards are for food, dishes, etc.) ?Keep a notepad and pen by the telephone to take messages ?  ?3)  Memory Aids ?A diary or journal/notebook/daily planner ?Making a list (shopping list, chore list, to do list that needs to be done) ?Using an alarm as a reminder (kitchen timer or cell phone alarm) ?Using cell phone to store information (Notes, Calendar, Reminders) ?Calendar/White board placed in a prominent position ?Post-it notes ?  ?In order for memory aids to be useful, you need to have good habits.  It's no good remembering to make a note in your journal if you don't remember to look in it.  Try setting aside a certain time of day to look in journal. ?  ?4)  Improving mood and managing fatigue. ?There may be other factors that contribute to memory difficulties.  Factors, such as anxiety, depression and tiredness can affect memory. ?Regular gentle exercise can help improve your mood and give you more energy. ?Simple relaxation techniques may help relieve symptoms of anxiety ?Try to get back to completing activities or hobbies you enjoyed doing in the past. ?Learn to pace yourself through activities to decrease fatigue. ?Find out about some local support groups where you can share experiences with others. ?Try and achieve 7-8 hours of sleep at night. ? ? ? ?
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
## 2021-12-13 ENCOUNTER — Telehealth: Payer: Self-pay | Admitting: Family Medicine

## 2021-12-13 NOTE — Telephone Encounter (Signed)
Called and spoke w/ wife. Explained I was reviewing AL,NP last office note from 12/06/21 and appears she instructed her to f/u with his psychiatrist about Lunesta. She thought [name] was calling psychiatrist to discuss. She states she just got out of shower and will call back to discuss further. ? ?Per [name] 12/06/21: "He has tried multiple sleep aids with no benefit. I have advised they continue discussion with psychiatry regarding Lunesta prescription and consider lowest effective dose. Persistent insomnia will significantly impact mood and memory. Benefits may outweigh risks at this time. Mrs Michael [name] agrees to monitor him closely after starting Lunesta. He will follow up closely with psychiatry for any worsening or unresolved symptoms. I have advised he not take trazodone, mirtazapine and Lunesta together. " ?

## 2021-12-13 NOTE — Telephone Encounter (Signed)
Pt's wife is asking for a call re:[name], NP reaching out to pt's psychiatrist re: pt taking Lunesta for sleep, please call. ?

## 2021-12-13 NOTE — Telephone Encounter (Signed)
Took call from phone staff and spoke w/ wife. Relayed I read [name]'s note again and she also wrote: "I will send my note to Dr Thayer Jew to let him know that I would support his thoughts of trying Lunesta at the lowest available dose. We have reviewed pros and cons of using Lunesta for sleep aid. " ? ?She will contact psychiatrist about next steps with Lunesta. Nothing further needed. ?

## 2022-01-03 DIAGNOSIS — I10 Essential (primary) hypertension: Secondary | ICD-10-CM | POA: Diagnosis not present

## 2022-01-06 ENCOUNTER — Telehealth: Payer: Self-pay | Admitting: Family Medicine

## 2022-01-06 NOTE — Telephone Encounter (Signed)
Can you please make sure that his wife was able to discuss treatment plan with psychiatry. Did psychiatry start Lunesta? We mentioned trying memantine if they wished. I am happy to start '5mg'$  BID as discussed at our last appt if they are interested. TY! ?

## 2022-01-06 NOTE — Telephone Encounter (Addendum)
Called wife, Michael [name]. She confirmed they went and saw psychiatrist but he did not want to place him on Lunesta, preferred he see sleep clinic if he wanted to try this. Wife states he was disappointed in the decision. Psychiatrist did place him on ambien '10mg'$ , take 1/2 tab po qhs. Feels it is helping some. He has been on this now for about a week.  ? ?They have not thought much about adding memantine. She will discuss further with her husband and call back to let us know if he wants to move forward with trying this or not. ? ?They are leaving to go out of town for the next couple weeks.  ?

## 2022-02-03 ENCOUNTER — Encounter (HOSPITAL_BASED_OUTPATIENT_CLINIC_OR_DEPARTMENT_OTHER): Payer: Self-pay

## 2022-02-03 DIAGNOSIS — G479 Sleep disorder, unspecified: Secondary | ICD-10-CM

## 2022-02-03 DIAGNOSIS — G4709 Other insomnia: Secondary | ICD-10-CM

## 2022-03-07 ENCOUNTER — Encounter (HOSPITAL_BASED_OUTPATIENT_CLINIC_OR_DEPARTMENT_OTHER): Payer: Medicare Other | Admitting: Internal Medicine

## 2022-04-15 DIAGNOSIS — I129 Hypertensive chronic kidney disease with stage 1 through stage 4 chronic kidney disease, or unspecified chronic kidney disease: Secondary | ICD-10-CM | POA: Diagnosis not present

## 2022-04-15 DIAGNOSIS — R209 Unspecified disturbances of skin sensation: Secondary | ICD-10-CM | POA: Diagnosis not present

## 2022-04-15 DIAGNOSIS — N189 Chronic kidney disease, unspecified: Secondary | ICD-10-CM | POA: Diagnosis not present

## 2022-04-15 DIAGNOSIS — G4700 Insomnia, unspecified: Secondary | ICD-10-CM | POA: Diagnosis not present

## 2022-04-25 ENCOUNTER — Telehealth: Payer: Self-pay | Admitting: Cardiovascular Disease

## 2022-04-25 NOTE — Telephone Encounter (Signed)
Spoke with patients wife and she is concerned about patients heart rates. Offered DOD slot for today but they are not able to make it here on time. She provided the following blood pressures:   Quarter to 2 today 146/68 HR 48, 147/70 HR 46, 10 min later 151/88 HR 48, 146/85 HR 47, 98% oxygen   Reviewed ED precautions and scheduled to come in next week to see Christell Faith PA-C. They confirmed this appointment with no further questions at this time.

## 2022-04-25 NOTE — Telephone Encounter (Signed)
STAT if HR is under 50 or over 120 (normal HR is 60-100 beats per minute)  What is your heart rate? 47  Do you have a log of your heart rate readings (document readings)? Quarter to 2 today 146/68 HR 48, 147/70 HR 46, 10 min later 151/88 HR 48, 146/85 HR 47, 98% oxygen   Do you have any other symptoms? No   Patient's wife states the patient's HR has been very low today.

## 2022-04-30 NOTE — Progress Notes (Unsigned)
Cardiology Office Note    Date:  05/02/2022   ID:  Delvis, Kau 03-21-1932, MRN [id number]  PCP:  Juluis Pitch, MD  Cardiologist:  Ida Rogue, MD  Electrophysiologist:  None   Chief Complaint: Bradycardia  History of Present Illness:   Michael [name] is a 88 y.o. male with history of CAD noted on CT imaging, aortic atherosclerosis, SVT, frequent PVCs, CKD stage III, HTN, and anemia who presents for evaluation of bradycardia.  He previously had frequent episodes of SVT, documented on event monitoring in 04/2014.  He was evaluated by EP at that time with plan to pursue RF CEA, however he ultimately decided to defer and did well with conservative therapy.  Echo in 2015 demonstrated normal LV systolic function, no regional wall motion abnormalities, mild concentric LVH, normal LV cavity size, grade 1 diastolic dysfunction, mild mitral regurgitation, trivial tricuspid regurgitation, and normal PASP.  Frequent PVCs were noted during the study.  Prior documentation indicates the patient was seen at the [place] and told to stop flecainide "secondary to QT."  They wanted to pursue loop monitoring, he declined.  Lexiscan MPI in 04/2017 showed no evidence of ischemia with a small defect of mild severity present in the apex location that was fixed and felt to most likely represent artifact.  Study was overall low risk with an EF of 55 to 65%.  He was last seen in the office in 09/2021 noting rare episodes of paroxysmal tachycardia approximately once per week lasting less than a minute.  Given CAD noted on CT imaging it was recommended he hold flecainide and was transitioned to amiodarone with continuation of diltiazem.  Primary cardiologist recommended the patient take an extra amiodarone for tachycardia.  He contacted our office on 04/25/2022 noting heart rates in the 40s bpm with BP in the 403K to 742V systolic.  Appointment was scheduled for today.  He comes in accompanied by his wife today who  assists with the office visit given the patient's difficulty hearing.  At baseline, he has a heart rate that typically runs in the upper 50s to low 60s bpm.  However, over the prior week they began to note some heart rates in the low 50s to upper 40s bpm.  He was without any symptoms of progressive dizziness, presyncope, or syncope.  No chest pain or dyspnea.  No worsening lower extremity swelling.  At baseline, he reports some positional dizziness if he stands quickly.  However, this was not any worse over the prior week.  He reports a longstanding history of chronic back pain with radiculopathy and insomnia.  With this, he did suffer a mechanical fall several days ago while turning in the restroom after voiding in the middle of the night.  He denies any syncope.  He did not hit his head or suffer LOC.  He did take an Ambien last night and reports he was able to sleep and with this feels quite good this morning.   Labs independently reviewed: 12/2021 - HGB 12.4, PLT 262, potassium 3.3, BUN 50, SCr 1.8 10/2021 - albumin 3.9, AST/ALT normal 04/2021 - TSH normal, TC 157, TG 102, HDL 61, LDL 75 04/2020 - magnesium 2.3  Past Medical History:  Diagnosis Date   Anemia    Aortic atherosclerosis (HCC)    Arthritis    Aspiration pneumonia ([place]) 2020   BPH (benign prostatic hypertrophy)    CAD (coronary artery disease) 05/23/2016   a.) CT 05/23/2016: extensive 3v CAD.  Cellulitis of face 12/21/2019   Chronic back pain    CKD (chronic kidney disease), stage III (HCC)    Colon cancer ([place]) 1988   Surgery alone, no history of chemo   Diastolic dysfunction 55/97/4163   a.) TTE 05/23/2014; EF 63%; G1DD. b.) TTE 10/27/2014: EF 61%, mild LA dilation; G2DD. c.) TTE 04/10/2020: EF >55%. GLS -20.3%. Triv panvalvular regurgitation; no stenosis.   Diverticulosis    Essential hypertension    First degree AV block    GERD (gastroesophageal reflux disease)    H/O calcium pyrophosphate deposition disease (CPPD)     Hiatal hernia 01/06/2015   History of kidney stones    HLD (hyperlipidemia)    HOH (hard of hearing)    bilateral hearing aids   Hypokalemia    Impotence of organic origin    Incisional hernia 10/2021   Lewy body dementia ([place])    Peritonsillar abscess determined by examination 05/15/2019   S/p abx, I&D by ENT 05/2019   PSVT (paroxysmal supraventricular tachycardia) ([place])    a. 04/2014 Event Monitor: Freq bouts of SVT with rates to 180-->seen by EP with recommendation for RFCA, pt deferred;  b. 05/2014 Echo: EF nl, mild conc LVH, no rwma, Gr1 DD, mildMR/TR.   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 Georgette Dover, MD   ROTATOR CUFF REPAIR Right 2000    [facility]   TOTAL HIP ARTHROPLASTY Right 03/24/2020   Procedure: TOTAL HIP ARTHROPLASTY ANTERIOR APPROACH;  Surgeon: Paralee Cancel, MD;  Location: [facility];  Service: Orthopedics;  Laterality: Right;  70 mins   TOTAL KNEE ARTHROPLASTY  03/05/2012   Procedure: TOTAL KNEE ARTHROPLASTY;  Surgeon: Gearlean Alf, MD;  Location: [facility];  Service: Orthopedics;  Laterality: Left;   [facility], x 4   VEIN LIGATION AND STRIPPING Right    XI ROBOTIC ASSISTED VENTRAL HERNIA N/A 11/01/2021   Procedure: XI ROBOTIC ASSISTED VENTRAL HERNIA;  Surgeon: Herbert Pun, MD;  Location: [facility] ORS;  Service: General;  Laterality: N/A;    Current Medications: Current  Meds  Medication Sig   acetaminophen (TYLENOL) 325 MG tablet Take 325 mg by mouth every 6 (six) hours as needed for moderate pain or headache.   amiodarone (PACERONE) 200 MG tablet Take 0.5 tablets (100 mg total) by mouth daily.   camphor-menthol (SARNA) lotion Apply 1 application topically as needed for itching (dry skin).   chlorthalidone (HYGROTON) 25 MG tablet Take 12.5 mg by mouth every morning.   cholecalciferol (VITAMIN D3) 25 MCG (1000 UNIT) tablet Take 1,000 Units by mouth daily.   diltiazem (CARDIZEM) 120 MG tablet Take 1 tablet (120 mg total) by mouth in the morning and at bedtime. (Patient taking differently: Take 120 mg by mouth daily.)   donepezil (ARICEPT) 10 MG tablet Take 0.5 tablets (5 mg total) by mouth at bedtime.   gabapentin (  NEURONTIN) 100 MG capsule Take 100 mg by mouth at bedtime.   losartan (COZAAR) 50 MG tablet Take 1 tablet (50 mg total) by mouth 2 (two) times daily.   lovastatin (MEVACOR) 20 MG tablet Take 1 tablet (20 mg total) by mouth daily. (Patient taking differently: Take 20 mg by mouth at bedtime.)   Multiple Vitamins-Minerals (CENTRUM SILVER ADULT 50+ PO) Take 1 tablet by mouth daily. Centrum Silver   omeprazole (PRILOSEC) 20 MG capsule TAKE 1 CAPSULE BY MOUTH EVERY DAY AS NEEDED.   oxyCODONE-acetaminophen (PERCOCET) 10-325 MG tablet Take 1 tablet by mouth 3 (three) times daily as needed for pain.   oxyCODONE-acetaminophen (PERCOCET) 5-325 MG tablet Take 1 tablet by mouth every 4 (four) hours as needed for severe pain.   Polyethyl Glycol-Propyl Glycol (SYSTANE OP) Place 1 drop into both eyes at bedtime.   potassium chloride (KLOR-CON M) 10 MEQ tablet Take 10 mEq by mouth 2 (two) times daily.   Vitamin A 3 MG (10000 UT) TABS Take 10,000 Units by mouth 2 (two) times a week.   zinc gluconate 50 MG tablet Take 50 mg by mouth daily.   [DISCONTINUED] amiodarone (PACERONE) 200 MG tablet Take 1 tablet (200 mg total) by mouth daily.    Allergies:   Patient has no  known allergies.   Social History   Socioeconomic History   Marital status: Married    Spouse name: Festus Holts   Number of children: 3   Years of education: college   Highest education level: Not on file  Occupational History   Occupation: Retired    Fish farm manager: Retired  Tobacco Use   Smoking status: Never   Smokeless tobacco: Never  Vaping Use   Vaping Use: Never used  Substance and Sexual Activity   Alcohol use: Yes    Alcohol/week: 1.0 standard drink of alcohol    Types: 1 Glasses of wine per week    Comment: Wine (1/2-1 glass 2x/week)   Drug use: No   Sexual activity: Yes  Other Topics Concern   Not on file  Social History Narrative   Married.  Independent with ADLs/ambulation.   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  ROS:   12-point review of system is negative unless otherwise noted in the HPI.   EKGs/Labs/Other Studies Reviewed:    Studies reviewed were summarized above. The additional studies were reviewed today:  Lexiscan MPI 05/08/2017: There was no ST segment deviation noted during stress. No T wave inversion was noted during stress. Defect 1: There is a small defect of mild severity present in the apex location. This defect is fixed and has normal wall motion and thus most likely is an artifact. The study is normal. This is a low risk study. The left ventricular ejection fraction is normal (55-65%). __________  2D echo 05/23/2014: - Left  ventricle: The cavity size was normal. There was mild    concentric  hypertrophy. Systolic function was normal. Wall motion    was normal; there were no regional wall motion abnormalities.    Doppler parameters are consistent with abnormal left ventricular    relaxation (grade 1 diastolic dysfunction).  - Mitral valve: There was mild regurgitation.  - Tricuspid valve: There was trivial regurgitation.  - Pulmonary arteries: Systolic pressure was within the normal    range.   Impressions:   - Frequent PVCs noted.   EKG:  EKG is ordered today.  The EKG ordered today demonstrates sinus bradycardia, 58 bpm, biphasic T waves, nonspecific ST-T changes, reviewed with Dr. Rockey Situ  Recent Labs: 07/08/2021: Potassium 3.1 05/02/2022: Hemoglobin 11.8; Platelets 210  Recent Lipid Panel    Component Value Date/Time   CHOL 138 05/07/2020 1010   TRIG 97.0 05/07/2020 1010   HDL 64.30 05/07/2020 1010   CHOLHDL 2 05/07/2020 1010   VLDL 19.4 05/07/2020 1010   LDLCALC 54 05/07/2020 1010   LDLDIRECT 75.4 05/03/2011 1603    PHYSICAL EXAM:    VS:  BP (!) 114/58 (BP Location: Left Arm, Patient Position: Sitting, Cuff Size: Normal)   Pulse (!) 58   Ht '5\' 10"'$  (1.778 m)   Wt 195 lb 2 oz (88.5 kg)   SpO2 96%   BMI 28.00 kg/m   BMI: Body mass index is 28 kg/m.  Physical Exam  Wt Readings from Last 3 Encounters:  05/02/22 195 lb 2 oz (88.5 kg)  12/06/21 198 lb 12.8 oz (90.2 kg)  10/26/21 190 lb (86.2 kg)     ASSESSMENT & PLAN:   Sinus bradycardia: No evidence of high-grade AV block or prolonged pauses noted on twelve-lead EKG in the office today.  At baseline, he has a heart rate that typically runs in the upper 50s to low 60s bpm.  However, over the preceding week he noted heart rates in the upper 40s to low 50s bpm.  With this, he was asymptomatic.  His wife does indicate the patient was found to have "abnormal thyroid" through the [place] [facility].  We do not have these records available for  review today.  Case was discussed with his primary cardiologist who recommends we continue the patient on current dose diltiazem, though if his bradycardic rates persist we could transition him from twice daily dosing to once daily dosing.  Decrease amiodarone to 100 mg daily.  Place a Zio patch.  Check CMP, TSH, free T4, and CBC.  Patient and wife also report he recently wore outpatient cardiac monitoring through the [place] [facility] system, details of this and results are unavailable for review.  SVT: Quiescent.  Medical management as outlined above.  History of frequent PVCs: Quiescent.  Medical management as outlined above.  CAD involving the native coronary arteries without angina/HLD: No symptoms suggestive of angina.  Continue current medical therapy and risk factor modification as directed by his primary cardiologist.  This was not discussed in detail at today's visit given a problem focused visit.  HTN: Blood pressure is well controlled in the office today.  No changes in medical therapy.   Disposition: F/u with Dr. Rockey Situ or an APP in 3 months.   Medication Adjustments/Labs and Tests Ordered: Current medicines are reviewed at length with the patient today.  Concerns regarding medicines are outlined above. Medication changes, Labs and Tests ordered today are summarized above and listed in the Patient Instructions accessible in Encounters.   Signed, Christell Faith, PA-C 05/02/2022 12:52 PM     [place]  [place] [id number] [phone]

## 2022-05-02 ENCOUNTER — Telehealth: Payer: Self-pay | Admitting: *Deleted

## 2022-05-02 ENCOUNTER — Ambulatory Visit (INDEPENDENT_AMBULATORY_CARE_PROVIDER_SITE_OTHER): Payer: Medicare Other

## 2022-05-02 ENCOUNTER — Other Ambulatory Visit
Admission: RE | Admit: 2022-05-02 | Discharge: 2022-05-02 | Disposition: A | Discharge status: Home / Self Care | Payer: Medicare Other | Source: Ambulatory Visit | Attending: Physician Assistant | Admitting: Physician Assistant

## 2022-05-02 ENCOUNTER — Ambulatory Visit: Payer: Medicare Other | Admitting: Physician Assistant

## 2022-05-02 ENCOUNTER — Encounter: Payer: Self-pay | Admitting: Physician Assistant

## 2022-05-02 VITALS — BP 114/58 | HR 58 | Ht 70.0 in | Wt 195.1 lb

## 2022-05-02 DIAGNOSIS — I2510 Atherosclerotic heart disease of native coronary artery without angina pectoris: Secondary | ICD-10-CM | POA: Diagnosis not present

## 2022-05-02 DIAGNOSIS — I700 Atherosclerosis of aorta: Secondary | ICD-10-CM | POA: Insufficient documentation

## 2022-05-02 DIAGNOSIS — I471 Supraventricular tachycardia, unspecified: Secondary | ICD-10-CM

## 2022-05-02 DIAGNOSIS — R001 Bradycardia, unspecified: Secondary | ICD-10-CM

## 2022-05-02 DIAGNOSIS — E785 Hyperlipidemia, unspecified: Secondary | ICD-10-CM | POA: Insufficient documentation

## 2022-05-02 LAB — T4, FREE: Free T4: 0.68 ng/dL (ref 0.61–1.12)

## 2022-05-02 LAB — CBC
HCT: 35.6 % — ABNORMAL LOW (ref 39.0–52.0)
Hemoglobin: 11.8 g/dL — ABNORMAL LOW (ref 13.0–17.0)
MCH: 32.9 pg (ref 26.0–34.0)
MCHC: 33.1 g/dL (ref 30.0–36.0)
MCV: 99.2 fL (ref 80.0–100.0)
Platelets: 210 10*3/uL (ref 150–400)
RBC: 3.59 MIL/uL — ABNORMAL LOW (ref 4.22–5.81)
RDW: 12.4 % (ref 11.5–15.5)
WBC: 6.1 10*3/uL (ref 4.0–10.5)
nRBC: 0 % (ref 0.0–0.2)

## 2022-05-02 LAB — COMPREHENSIVE METABOLIC PANEL
ALT: 26 U/L (ref 0–44)
AST: 40 U/L (ref 15–41)
Albumin: 4.1 g/dL (ref 3.5–5.0)
Alkaline Phosphatase: 55 U/L (ref 38–126)
Anion gap: 8 (ref 5–15)
BUN: 38 mg/dL — ABNORMAL HIGH (ref 8–23)
CO2: 28 mmol/L (ref 22–32)
Calcium: 9.1 mg/dL (ref 8.9–10.3)
Chloride: 103 mmol/L (ref 98–111)
Creatinine, Ser: 1.76 mg/dL — ABNORMAL HIGH (ref 0.61–1.24)
GFR, Estimated: 37 mL/min — ABNORMAL LOW (ref >60)
Glucose, Bld: 104 mg/dL — ABNORMAL HIGH (ref 70–99)
Potassium: 3 mmol/L — ABNORMAL LOW (ref 3.5–5.1)
Sodium: 139 mmol/L (ref 135–145)
Total Bilirubin: 1 mg/dL (ref 0.3–1.2)
Total Protein: 7 g/dL (ref 6.5–8.1)

## 2022-05-02 LAB — TSH: TSH: 8.482 u[IU]/mL — ABNORMAL HIGH (ref 0.350–4.500)

## 2022-05-02 MED ORDER — AMIODARONE HCL 200 MG PO TABS: 100.0000 mg | ORAL_TABLET | Freq: Every day | ORAL | 3 refills | Status: DC

## 2022-05-02 MED ORDER — POTASSIUM CHLORIDE CRYS ER 10 MEQ PO TBCR: 10.0000 meq | EXTENDED_RELEASE_TABLET | Freq: Two times a day (BID) | ORAL | 11 refills | Status: DC

## 2022-05-02 NOTE — Telephone Encounter (Signed)
Reviewed results and recommendations with patients wife per release form. She verbalized understanding of all instructions with no further questions at this time.

## 2022-05-02 NOTE — Telephone Encounter (Signed)
-----   Message from Rise Mu, PA-C sent at 05/02/2022  1:47 PM EDT ----- Blood count low though stable and consistent with prior readings TSH is abnormal with normal free T4 Potassium is low Renal function is abnormal though consistent with readings dating back to 2021 LFT normal  Recommendations: -[facility] for [facility] management of subclinical abnormal TSH, may need to consider discontinuation of amiodarone pending trend of thyroid function and timeline of abnormal TSH -Please have the patient increase KCl to 20 mEq twice daily with follow-up BMP in 1 to 2 weeks to ensure stable potassium on titrated dose of KCl -Follow-up with PCP for ongoing management of underlying renal dysfunction and anemia

## 2022-05-02 NOTE — Patient Instructions (Signed)
Medication Instructions:  Your physician has recommended you make the following change in your medication:   REDUCE Amiodarone to 100 mg once daily   *If you need a refill on your cardiac medications before your next appointment, please call your pharmacy*   Lab Work: CMET, CBC, TSH, and Free T4 to be done today over at the [employer] at [facility]. Go to the 1st desk on the right to check in (REGISTRATION)   If you have labs (blood work) drawn today and your tests are completely normal, you will receive your results only by: [place] (if you have MyChart) OR A paper copy in the mail If you have any lab test that is abnormal or we need to change your treatment, we will call you to review the results.   Testing/Procedures: Your physician has recommended that you wear a Zio monitor.   This monitor is a medical device that records the heart's electrical activity. Doctors most often use these monitors to diagnose arrhythmias. Arrhythmias are problems with the speed or rhythm of the heartbeat. The monitor is a small device applied to your chest. You can wear one while you do your normal daily activities. While wearing this monitor if you have any symptoms to push the button and record what you felt. Once you have worn this monitor for the period of time provider prescribed (Usually 14 days), you will return the monitor device in the postage paid box. Once it is returned they will download the data collected and provide [place] with a report which the provider will then review and we will call you with those results. Important tips:  Avoid showering during the first 24 hours of wearing the monitor. Avoid excessive sweating to help maximize wear time. Do not submerge the device, no hot tubs, and no swimming pools. Keep any lotions or oils away from the patch. After 24 hours you may shower with the patch on. Take brief showers with your back facing the shower head.  Do not remove patch once  it has been placed because that will interrupt data and decrease adhesive wear time. Push the button when you have any symptoms and write down what you were feeling. Once you have completed wearing your monitor, remove and place into box which has postage paid and place in your outgoing mailbox.  If for some reason you have misplaced your box then call our office and we can provide another box and/or mail it off for you.      Follow-Up: At [facility], you and your health needs are our priority.  As part of our continuing mission to provide you with exceptional heart care, we have created designated Provider Care Teams.  These Care Teams include your primary Cardiologist (physician) and Advanced Practice Providers (APPs -  Physician Assistants and Nurse Practitioners) who all work together to provide you with the care you need, when you need it.  We recommend signing up for the patient portal called "MyChart".  Sign up information is provided on this After Visit Summary.  MyChart is used to connect with patients for Virtual Visits (Telemedicine).  Patients are able to view lab/test results, encounter notes, upcoming appointments, etc.  Non-urgent messages can be sent to your provider as well.   To learn more about what you can do with MyChart, go to [email or web address].    Your next appointment:   3 month(s)  The format for your next appointment:   In Person  Provider:  Ida Rogue, MD or Christell Faith, PA-C      Important Information About [place]

## 2022-05-03 ENCOUNTER — Encounter: Payer: Self-pay | Admitting: *Deleted

## 2022-05-03 ENCOUNTER — Telehealth: Payer: Self-pay | Admitting: Physician Assistant

## 2022-05-03 DIAGNOSIS — N183 Chronic kidney disease, stage 3 unspecified: Secondary | ICD-10-CM | POA: Diagnosis not present

## 2022-05-03 DIAGNOSIS — Z1389 Encounter for screening for other disorder: Secondary | ICD-10-CM | POA: Diagnosis not present

## 2022-05-03 DIAGNOSIS — G629 Polyneuropathy, unspecified: Secondary | ICD-10-CM | POA: Diagnosis not present

## 2022-05-03 DIAGNOSIS — E785 Hyperlipidemia, unspecified: Secondary | ICD-10-CM | POA: Diagnosis not present

## 2022-05-03 DIAGNOSIS — Z0000 Encounter for general adult medical examination without abnormal findings: Secondary | ICD-10-CM | POA: Diagnosis not present

## 2022-05-03 DIAGNOSIS — I129 Hypertensive chronic kidney disease with stage 1 through stage 4 chronic kidney disease, or unspecified chronic kidney disease: Secondary | ICD-10-CM | POA: Diagnosis not present

## 2022-05-03 DIAGNOSIS — I471 Supraventricular tachycardia: Secondary | ICD-10-CM | POA: Diagnosis not present

## 2022-05-03 DIAGNOSIS — G4700 Insomnia, unspecified: Secondary | ICD-10-CM | POA: Diagnosis not present

## 2022-05-03 NOTE — Telephone Encounter (Signed)
Left detailed voicemail message that results will be mailed and to call back if any further questions.

## 2022-05-03 NOTE — Telephone Encounter (Signed)
Patient's wife called stating she can't see her husband's labs.  She would like for them to be faxed to her at [phone].

## 2022-05-12 DIAGNOSIS — R001 Bradycardia, unspecified: Secondary | ICD-10-CM | POA: Diagnosis not present

## 2022-05-12 DIAGNOSIS — I471 Supraventricular tachycardia: Secondary | ICD-10-CM

## 2022-05-14 DIAGNOSIS — G894 Chronic pain syndrome: Secondary | ICD-10-CM | POA: Diagnosis not present

## 2022-05-14 DIAGNOSIS — M5416 Radiculopathy, lumbar region: Secondary | ICD-10-CM | POA: Diagnosis not present

## 2022-05-19 DIAGNOSIS — I10 Essential (primary) hypertension: Secondary | ICD-10-CM | POA: Diagnosis not present

## 2022-05-19 DIAGNOSIS — I471 Supraventricular tachycardia: Secondary | ICD-10-CM | POA: Diagnosis not present

## 2022-05-29 ENCOUNTER — Ambulatory Visit (HOSPITAL_BASED_OUTPATIENT_CLINIC_OR_DEPARTMENT_OTHER): Payer: Medicare Other | Attending: Internal Medicine | Admitting: Internal Medicine

## 2022-05-29 VITALS — Ht 70.0 in | Wt 190.0 lb

## 2022-05-29 DIAGNOSIS — G4709 Other insomnia: Secondary | ICD-10-CM | POA: Diagnosis not present

## 2022-05-29 DIAGNOSIS — R0902 Hypoxemia: Secondary | ICD-10-CM | POA: Insufficient documentation

## 2022-05-29 DIAGNOSIS — G479 Sleep disorder, unspecified: Secondary | ICD-10-CM | POA: Diagnosis not present

## 2022-05-30 ENCOUNTER — Other Ambulatory Visit: Payer: Self-pay

## 2022-05-30 MED ORDER — DONEPEZIL HCL 10 MG PO TABS: 5.0000 mg | ORAL_TABLET | Freq: Every day | ORAL | 1 refills | Status: DC

## 2022-06-01 DIAGNOSIS — I471 Supraventricular tachycardia: Secondary | ICD-10-CM | POA: Diagnosis not present

## 2022-06-01 DIAGNOSIS — R001 Bradycardia, unspecified: Secondary | ICD-10-CM | POA: Diagnosis not present

## 2022-06-02 ENCOUNTER — Encounter (HOSPITAL_BASED_OUTPATIENT_CLINIC_OR_DEPARTMENT_OTHER): Payer: Medicare Other | Admitting: Internal Medicine

## 2022-06-04 NOTE — Procedures (Signed)
   Patient Name: Michael [name], Michael [name] Date: 05/29/2022 Gender: Male D.O.B: 04-28-1932 Age (years): 44 Referring Provider: Carylon Perches MD Height (inches): 70 Interpreting Physician: Baird Lyons MD, ABSM Weight (lbs): 190 RPSGT: Earney [place] BMI: 27 MRN: [id number] Neck Size: 17.00  CLINICAL INFORMATION Sleep Study Type: NPSG Indication for sleep study: OSA Epworth Sleepiness Score: 2  SLEEP STUDY TECHNIQUE As per the AASM Manual for the Scoring of Sleep and Associated Events v2.3 (April 2016) with a hypopnea requiring 4% desaturations.  The channels recorded and monitored were frontal, central and occipital EEG, electrooculogram (EOG), submentalis EMG (chin), nasal and oral airflow, thoracic and abdominal wall motion, anterior tibialis EMG, snore microphone, electrocardiogram, and pulse oximetry.  MEDICATIONS Medications self-administered by patient taken the night of the study : DONEPEZIL HCL, LUNESTA 3 mg, GABAPENTIN, ACETAMINOPHEN W/OXYCODONE, diclofenac sodium  SLEEP ARCHITECTURE The study was initiated at 11:03:11 PM and ended at 5:03:48 AM.  Sleep onset time was 25.9 minutes and the sleep efficiency was 42.8%%. The total sleep time was 154.3 minutes.  Stage REM latency was N/A minutes.  The patient spent 7.8%% of the night in stage N1 sleep, 92.2%% in stage N2 sleep, 0.0%% in stage N3 and 0% in REM.  Alpha intrusion was absent.  Supine sleep was 10.05%.  RESPIRATORY PARAMETERS The overall apnea/hypopnea index (AHI) was 3.1 per hour. There were 3 total apneas, including 0 obstructive, 3 central and 0 mixed apneas. There were 5 hypopneas and 8 RERAs.  The AHI during Stage REM sleep was N/A per hour.  AHI while supine was 7.7 per hour.  The mean oxygen saturation was 88.9%. The minimum SpO2 during sleep was 82.0%.  moderate snoring was noted during this study.  CARDIAC DATA The 2 lead EKG demonstrated sinus rhythm. The mean heart rate was 56.1 beats  per minute. Other EKG findings include: Rare PVC.  LEG MOVEMENT DATA The total PLMS were 0 with a resulting PLMS index of 0.0. Associated arousal with leg movement index was 0.0 .  IMPRESSIONS - Negligible sustained sleep before 02:00 AM. Patient humming while awake. Bruxism noted when asleep. - No significant obstructive sleep apnea occurred during this study (AHI = 3.1/h). - No significant central sleep apnea occurred during this study (CAI = 1.2/h). - Oxygen desaturation was noted during this study (Min O2 = 82.0%). Mean 88.9% - The patient snored with moderate snoring volume. - No significant cardiac abnormalities were noted during this study. - Clinically significant periodic limb movements did not occur during sleep. No significant associated arousals.  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 - Evaluate need for supplemental O2 during sleep. - Sleep hygiene should be reviewed to assess factors that may improve sleep quality. - Weight management and regular exercise should be initiated or continued if appropriate.  [Electronically signed] 06/05/2022 11:33 AM  Baird Lyons MD, ABSM Diplomate, American Board of Sleep Medicine NPI: [id number]                          [place] [place] [facility]  ELECTRONICALLY SIGNED ON:  06/04/2022, 12:17 PM [place] PH: [phone]   FX: [phone] [place]

## 2022-06-05 DIAGNOSIS — G4709 Other insomnia: Secondary | ICD-10-CM

## 2022-06-13 DIAGNOSIS — G894 Chronic pain syndrome: Secondary | ICD-10-CM | POA: Diagnosis not present

## 2022-06-13 DIAGNOSIS — M5416 Radiculopathy, lumbar region: Secondary | ICD-10-CM | POA: Diagnosis not present

## 2022-06-20 DIAGNOSIS — I10 Essential (primary) hypertension: Secondary | ICD-10-CM | POA: Diagnosis not present

## 2022-06-20 DIAGNOSIS — N1832 Chronic kidney disease, stage 3b: Secondary | ICD-10-CM | POA: Diagnosis not present

## 2022-06-20 DIAGNOSIS — D631 Anemia in chronic kidney disease: Secondary | ICD-10-CM | POA: Diagnosis not present

## 2022-07-05 ENCOUNTER — Encounter: Payer: Self-pay | Admitting: Neurology

## 2022-07-05 ENCOUNTER — Ambulatory Visit: Payer: Medicare Other | Admitting: Neurology

## 2022-07-05 VITALS — BP 143/77 | HR 60 | Ht 70.0 in | Wt 199.0 lb

## 2022-07-05 DIAGNOSIS — F02A0 Dementia in other diseases classified elsewhere, mild, without behavioral disturbance, psychotic disturbance, mood disturbance, and anxiety: Secondary | ICD-10-CM

## 2022-07-05 DIAGNOSIS — G4752 REM sleep behavior disorder: Secondary | ICD-10-CM

## 2022-07-05 DIAGNOSIS — R413 Other amnesia: Secondary | ICD-10-CM

## 2022-07-05 DIAGNOSIS — G3183 Dementia with Lewy bodies: Secondary | ICD-10-CM

## 2022-07-05 DIAGNOSIS — R269 Unspecified abnormalities of gait and mobility: Secondary | ICD-10-CM

## 2022-07-05 MED ORDER — MIRTAZAPINE 15 MG PO TABS: 15.0000 mg | ORAL_TABLET | Freq: Every day | ORAL | 5 refills | Status: DC

## 2022-07-05 NOTE — Progress Notes (Signed)
[facility]  PATIENT: Michael [name] DOB: Jun 04, 1932  REFERRING DOCTOR OR PCP:  Eustaquio Boyden SOURCE: Patient, son, notes from primary care, imaging and lab reports, CT scan of the head from 2015 personally reviewed.  _________________________________   HISTORICAL  CHIEF COMPLAINT:  Chief Complaint  Patient presents with   Follow-up    Pt in room #2 with his wife. Pt here today for f/u dementia.    HISTORY OF PRESENT ILLNESS:   He is a 88 y.o. man with REM behavior disorder, memory loss and mild gait disturbance.    Update 07/05/2022: Neurologically he and his wife feel that he is stable.  Specifically, cognition is stable and gait is stable.   His biggest problem is poor sleep.   He is on Lunesta 3 mg but feels he still has trouble falling asleep or staying asleep.   He is waking up a lot and then having difficulty getting back to sleep.   He is not having 'near-death ' dreams like he did in the past.   About once a month he has a dream with kicking but these are not occurring a couple times a week like they had when I first saw him.   We had tried clonazepam 0.5 milligrams at that time and active dreams improved but he still had insomnia.    The clonazepam was discontinued by the [facility] psychiatrist and he was placed on Seroquel for a while which did not help his sleep is much  He is on oxycodone for lower back pain.   He sees Ortho and gets ESI at times.     PSG 05/29/2022 (Dr. Maple Hudson).   There was no REM sleep and no N3 sleep.   No OSA (AHI=3).  He is on opiate but no CSA (CAI=1.2)   Mean SaO2 = 88.9.    He is walking well since his hip replacement.    His memory is about the same.   He is forgetful.   He feels he has reduced word recognition.   Speech is fine.    Donepezil 5 mg was started last year.  He is not sure if it has made a difference with memory or not.   He notes having difficulty with auditory processing.   He is hard of hearing which may also  affect cognitive performance some.     MRI brain 12/101/2020 showed:  Mild generalized cortical atrophy.   Minimal chronic microvascular ischemic changes, typical for age.  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  When asked about it during the visit, he just hummed louder      07/05/2022    3:46 PM 12/06/2021    2:31 PM 04/14/2021    1:44 PM  MMSE - Mini Mental State Exam  Orientation to time 5 5 3   Orientation to Place 5 5 3   Registration 3 3 3   Attention/ Calculation 2 2 1   Recall 2 2 3   Language- name 2 objects 2 2 2   Language- repeat 1 1 1   Language- follow 3 step command 3 2 3   Language- read & follow direction 1 1 1   Write a sentence 1 1 1   Copy design 1 1 0  Total score 26 25 21       HISTORY: He has had more difficulty with memory and gait since  2019.     He notes that he has been more forgetful in the sun notes that he has had some difficulties with memory over the last year or 2.  Additionally, he seems to have a little bit more trouble figuring out some tasks.  He continues to drive and has not had any difficulty.  On recent evaluation by his primary care provider, Dr. Sharen Hones, he scored 25/30 on the Mini-Mental status exam.   Additionally, he has 'ear worms' often hearing a song in his head that plays over and over --- this occurs for hours and will be one voice initially and then another voice,sometimes both.  He just hears the songs over and over again in different voices, there is no threatening aspect.    This keeps him up at nights.   He has another auditory septum.  He sometimes hears bombs or shots or thunder that makes him suddenly notice and look around.     These occur more at night but not every night and a very brief.    It is so real he has gone through the house with a gun.   For the past 10 years, he has threatening dreams where he is fighting and  he kicks and strikes out.   His wife wakes him up.  They occur once or twice a month.  For insomnia, he takes trazodone .  He wakes up every hour or so to urinate.   He has no visual hallucinations.       Laboratory test from 2020 showed:  TSH was normal.  Vitamin D was normal.  He has mild chronic renal insufficiency with a creatinine of 1.5.  Lipids and CBC were fine.  Vitamin B12 was normal last year.  REVIEW OF SYSTEMS: Constitutional: No fevers, chills, sweats, or change in appetite.  He has insomnia and other sleep issues (see above) Eyes: No visual changes, double vision, eye pain Ear, nose and throat: No hearing loss, ear pain, nasal congestion, sore throat Cardiovascular: No chest pain, palpitations Respiratory:  No shortness of breath at rest or with exertion.   No wheezes GastrointestinaI: No nausea, vomiting, diarrhea, abdominal pain, fecal incontinence Genitourinary: He has urinary frequency but no incontinence. Musculoskeletal: He notes back pain and leg pain. Integumentary: No rash, pruritus, skin lesions Neurological: as above Psychiatric: No depression at this time.  No anxiety Endocrine: No palpitations, diaphoresis, change in appetite, change in weigh or increased thirst Hematologic/Lymphatic:  No anemia, purpura, petechiae. Allergic/Immunologic: No itchy/runny eyes, nasal congestion, recent allergic reactions, rashes  ALLERGIES: No Known Allergies  HOME MEDICATIONS:  Current Outpatient Medications:    acetaminophen (TYLENOL) 325 MG tablet, Take 325 mg by mouth every 6 (six) hours as needed for moderate pain or headache., Disp: , Rfl:    amiodarone (PACERONE) 200 MG tablet, Take 0.5 tablets (100 mg total) by mouth daily., Disp: 90 tablet, Rfl: 3   camphor-menthol (SARNA) lotion, Apply 1 application topically as needed for itching (dry skin)., Disp: , Rfl:    chlorthalidone (HYGROTON) 25 MG tablet, Take 12.5 mg by mouth every morning., Disp: , Rfl:    cholecalciferol  (VITAMIN D3) 25 MCG (1000 UNIT) tablet, Take 1,000 Units by mouth daily., Disp: , Rfl:    diltiazem (CARDIZEM) 120 MG tablet, Take 1 tablet (120 mg total) by mouth in the morning and at bedtime. (Patient taking differently: Take 120 mg by mouth daily.), Disp: 180 tablet, Rfl: 3   donepezil (ARICEPT) 10 MG tablet, Take 0.5  tablets (5 mg total) by mouth at bedtime., Disp: 90 tablet, Rfl: 1   gabapentin (NEURONTIN) 100 MG capsule, Take 100 mg by mouth at bedtime., Disp: , Rfl:    losartan (COZAAR) 50 MG tablet, Take 1 tablet (50 mg total) by mouth 2 (two) times daily., Disp: 180 tablet, Rfl: 3   lovastatin (MEVACOR) 20 MG tablet, Take 1 tablet (20 mg total) by mouth daily. (Patient taking differently: Take 20 mg by mouth at bedtime.), Disp: 90 tablet, Rfl: 3   mirtazapine (REMERON) 15 MG tablet, Take 1 tablet (15 mg total) by mouth at bedtime., Disp: 30 tablet, Rfl: 5   Multiple Vitamins-Minerals (CENTRUM SILVER ADULT 50+ PO), Take 1 tablet by mouth daily. Centrum Silver, Disp: , Rfl:    omeprazole (PRILOSEC) 20 MG capsule, TAKE 1 CAPSULE BY MOUTH EVERY DAY AS NEEDED., Disp: 90 capsule, Rfl: 3   oxyCODONE-acetaminophen (PERCOCET) 10-325 MG tablet, Take 1 tablet by mouth 3 (three) times daily as needed for pain., Disp: , Rfl:    oxyCODONE-acetaminophen (PERCOCET) 5-325 MG tablet, Take 1 tablet by mouth every 4 (four) hours as needed for severe pain., Disp: 10 tablet, Rfl: 0   Polyethyl Glycol-Propyl Glycol (SYSTANE OP), Place 1 drop into both eyes at bedtime., Disp: , Rfl:    potassium chloride (KLOR-CON M) 10 MEQ tablet, Take 1 tablet (10 mEq total) by mouth 2 (two) times daily., Disp: 120 tablet, Rfl: 11   Vitamin A 3 MG (10000 UT) TABS, Take 10,000 Units by mouth 2 (two) times a week., Disp: , Rfl:    zinc gluconate 50 MG tablet, Take 50 mg by mouth daily., Disp: , Rfl:   PAST MEDICAL HISTORY: Past Medical History:  Diagnosis Date   Anemia    Aortic atherosclerosis (HCC)    Arthritis     Aspiration pneumonia (HCC) 2020   BPH (benign prostatic hypertrophy)    CAD (coronary artery disease) 05/23/2016   a.) CT 05/23/2016: extensive 3v CAD.   Cellulitis of face 12/21/2019   Chronic back pain    CKD (chronic kidney disease), stage III (HCC)    Colon cancer (HCC) 1988   Surgery alone, no history of chemo   Diastolic dysfunction 05/23/2014   a.) TTE 05/23/2014; EF 63%; G1DD. b.) TTE 10/27/2014: EF 61%, mild LA dilation; G2DD. c.) TTE 04/10/2020: EF >55%. GLS -20.3%. Triv panvalvular regurgitation; no stenosis.   Diverticulosis    Essential hypertension    First degree AV block    GERD (gastroesophageal reflux disease)    H/O calcium pyrophosphate deposition disease (CPPD)    Hiatal hernia 01/06/2015   History of kidney stones    HLD (hyperlipidemia)    HOH (hard of hearing)    bilateral hearing aids   Hypokalemia    Impotence of organic origin    Incisional hernia 10/2021   Lewy body dementia (HCC)    Peritonsillar abscess determined by examination 05/15/2019   S/p abx, I&D by ENT 05/2019   PSVT (paroxysmal supraventricular tachycardia)    a. 04/2014 Event Monitor: Freq bouts of SVT with rates to 180-->seen by EP with recommendation for RFCA, pt deferred;  b. 05/2014 Echo: EF nl, mild conc LVH, no rwma, Gr1 DD, mildMR/TR.   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   Rotator cuff rupture    Right; s/p repair   Unspecified hearing loss    Ventral hernia 1988    PAST SURGICAL HISTORY: Past Surgical History:  Procedure Laterality Date   CATARACT EXTRACTION W/ INTRAOCULAR LENS  IMPLANT Bilateral    COLONOSCOPY     HEMICOLOECTOMY W/ ANASTOMOSIS  1988   [name]   INGUINAL HERNIA REPAIR Right 1963   INSERTION OF MESH N/A 11/01/2021   Procedure: INSERTION OF MESH;  Surgeon: Carolan Shiver, MD;  Location: [facility];  Service: General;  Laterality: N/A;   MASTECTOMY, PARTIAL Left 02/07/2013   LEFT SUBCUTANEOUS MASTECTOMY; Wilmon Arms. Corliss Skains, MD   ROTATOR CUFF REPAIR  Right 2000    [facility]   TOTAL HIP ARTHROPLASTY Right 03/24/2020   Procedure: TOTAL HIP ARTHROPLASTY ANTERIOR APPROACH;  Surgeon: Durene Romans, MD;  Location: [facility];  Service: Orthopedics;  Laterality: Right;  70 mins   TOTAL KNEE ARTHROPLASTY  03/05/2012   Procedure: TOTAL KNEE ARTHROPLASTY;  Surgeon: Loanne Drilling, MD;  Location: [facility];  Service: Orthopedics;  Laterality: Left;   UMBILICAL HERNIA REPAIR  1988   [facility], x 4   VEIN LIGATION AND STRIPPING Right    XI ROBOTIC ASSISTED VENTRAL HERNIA N/A 11/01/2021   Procedure: XI ROBOTIC ASSISTED VENTRAL HERNIA;  Surgeon: Carolan Shiver, MD;  Location: [facility];  Service: General;  Laterality: N/A;    FAMILY HISTORY: Family History  Problem Relation Age of Onset   Osteoarthritis Father    Congestive Heart Failure Mother     SOCIAL HISTORY:  Social History   Socioeconomic History   Marital status: Married    Spouse name: [name]   Number of children: 3   Years of education: college   Highest education level: Not on file  Occupational History   Occupation: Retired    Associate Professor: Retired  Tobacco Use   Smoking status: Never   Smokeless tobacco: Never  Vaping Use   Vaping Use: Never used  Substance and Sexual Activity   Alcohol use: Yes    Alcohol/week: 1.0 standard drink of alcohol    Types: 1 Glasses of wine per week    Comment: Wine (1/2-1 glass 2x/week)   Drug use: No   Sexual activity: Yes  Other Topics Concern   Not on file  Social History Narrative   Married.  Independent with ADLs/ambulation.   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   General: The patient is well-developed and well-nourished and in no acute distress  HEENT:  Head is [place]/AT.   Skin: Extremities are without rash or  edema.   Neurologic Exam  Mental status: The patient is alert and oriented to name, place, month and year but not date at the time of the examination. The patient has reduced short-term memory and attention.   Speech is normal.  Cranial nerves: Extraocular movements are full.    Facial symmetry is present. Facial strength is normal.  Trapezius and sternocleidomastoid strength is normal. No dysarthria  is noted.   He has reduced hearing.  Motor: No cogwheeling or tremor.  Muscle bulk is normal.  Strength is  5 / 5 in all 4 extremities.   Sensory: Sensory testing is intact to pinprick, soft touch and vibration sensation in all 4 extremities except for the L5 distribution of the right foot  Coordination: Cerebellar testing reveals good finger-nose-finger and heel-to-shin bilaterally.  Gait and station: Station is normal.   The stride is mildly reduced and it takes him 5 steps to turn 1 to 80 degrees, the same as earlier this year.   He has retropulsion when the posture is disturbed.Marland Kitchen He cannot do a tandem gait.  Romberg is negative.   Reflexes: Deep tendon reflexes are symmetric and normal bilaterally.       DIAGNOSTIC DATA (LABS, IMAGING, TESTING) - I reviewed patient records, labs, notes, testing and imaging myself where available.  Lab Results  Component Value Date   WBC 6.1 05/02/2022   HGB 11.8 (L) 05/02/2022   HCT 35.6 (L) 05/02/2022   MCV 99.2 05/02/2022   PLT 210 05/02/2022      Component Value Date/Time   NA 139 05/02/2022 1238   K 3.0 (L) 05/02/2022 1238   CL 103 05/02/2022 1238    CO2 28 05/02/2022 1238   GLUCOSE 104 (H) 05/02/2022 1238   BUN 38 (H) 05/02/2022 1238   CREATININE 1.76 (H) 05/02/2022 1238   CALCIUM 9.1 05/02/2022 1238   PROT 7.0 05/02/2022 1238   ALBUMIN 4.1 05/02/2022 1238   AST 40 05/02/2022 1238   ALT 26 05/02/2022 1238   ALKPHOS 55 05/02/2022 1238   BILITOT 1.0 05/02/2022 1238   GFRNONAA 37 (L) 05/02/2022 1238   GFRAA 49 (L) 03/26/2020 0315   Lab Results  Component Value Date   CHOL 138 05/07/2020   HDL 64.30 05/07/2020   LDLCALC 54 05/07/2020   LDLDIRECT 75.4 05/03/2011   TRIG 97.0 05/07/2020   CHOLHDL 2 05/07/2020   Lab Results  Component Value Date   HGBA1C 4.9 02/19/2020   Lab Results  Component Value Date   VITAMINB12 1,420 (H) 04/16/2020   Lab Results  Component Value Date   TSH 8.482 (H) 05/02/2022       ASSESSMENT AND PLAN  Mild Lewy body dementia, unspecified whether behavioral, psychotic, or mood disturbance or anxiety (HCC)  REM sleep behavior disorder  Memory deficit  Gait disturbance  1.   He most likely has Lewy body dementia.  Clonazepam had helped the RBD.  It was d.c and he continued to do well (perhaps bcause PSGz showed no REM sleep.    He still has insomnia and despite Lunesta.  I will start mirtazapine.   (As on oxycodone, I'll hold off restarting clonazepam). 2.    PSG showed no significant OSA or CSA but he has nocturnal hypoxemia.   Advised to f/u with PCP or pulmonary  3.   Try to stay active and exercise as tolerated.  We discussed using his cane in open spaces. 4.   Return to see [place] in 6 months or sooner for new or worsening neurologic symptoms.  40-minute office visit with the majority of the time spent face-to-face for history and physical, discussion/counseling and decision-making.  Additional time with record review and documentation.   Faizan Geraci A. Epimenio Foot, MD, PhD, Larene Beach 07/05/2022, 8:22 PM Certified in Neurology, Clinical Neurophysiology, Sleep Medicine and Neuroimaging  [facility] [address] [place], [place] [address] [phone]

## 2022-07-08 ENCOUNTER — Other Ambulatory Visit: Payer: Self-pay | Admitting: Nephrology

## 2022-07-08 ENCOUNTER — Other Ambulatory Visit: Payer: Self-pay | Admitting: Psychiatry

## 2022-07-08 DIAGNOSIS — N1832 Chronic kidney disease, stage 3b: Secondary | ICD-10-CM

## 2022-07-12 DIAGNOSIS — Z23 Encounter for immunization: Secondary | ICD-10-CM | POA: Diagnosis not present

## 2022-07-12 DIAGNOSIS — R059 Cough, unspecified: Secondary | ICD-10-CM | POA: Diagnosis not present

## 2022-07-12 DIAGNOSIS — Z03818 Encounter for observation for suspected exposure to other biological agents ruled out: Secondary | ICD-10-CM | POA: Diagnosis not present

## 2022-07-12 DIAGNOSIS — R6883 Chills (without fever): Secondary | ICD-10-CM | POA: Diagnosis not present

## 2022-07-14 ENCOUNTER — Ambulatory Visit
Admission: RE | Admit: 2022-07-14 | Discharge: 2022-07-14 | Disposition: A | Discharge status: Home / Self Care | Payer: No Typology Code available for payment source | Source: Ambulatory Visit | Attending: Nephrology | Admitting: Nephrology

## 2022-07-14 DIAGNOSIS — N1832 Chronic kidney disease, stage 3b: Secondary | ICD-10-CM | POA: Insufficient documentation

## 2022-07-14 NOTE — Progress Notes (Signed)
Cardiology Office Note  Date:  07/15/2022   ID:  Michael [name], Michael [name] 01-05-1932, MRN [id number]  PCP:  Juluis Pitch, MD   Chief Complaint  Patient presents with   6 month follow up     "Doing well." Medications reviewed by the patient verbally.     HPI:  [name] is a pleasant 88 year old male with past medical history of Three-vessel CAD on CT scan Moderate diffuse aortic athero total knee replacement,  renal dysfunction,  SVT previously seen by Dr. Rayann Heman, plan was made for SVT ablation. in the end he changed his mind and canceled the procedure,  Insomnia, nephropathy, chronic back pain, pain meds He presents today for follow-up of his SVT and CAD/aortic atherosclerosis  Hard of hearing Last seen by myself jan 2023 Started on amiodarone for SVT  contacted our office on 04/25/2022 noting heart rates in the 40s bpm with BP in the 619J to 093O systolic  Seen in office 6/71/24 Amiodarone down to 100 daily, diltiazem dosing reduced 120 daily  Earlier this week with fever, malaise Seen by Dr. Lovie Macadamia Reports he feels better  Sleep study reviewed, detailing  hypoxia at night, mean 89, down to 82% Has not seen pulmonary  Chronic insomnia/poor sleep hygiene  followed by neurology and neurosurgery Started on remeron Prior cortisone shots, reports did not help his pain now cannot sleep without oxycodone 1 at night Lunesta not helping, gabapentin not helping with his leg pain Wife reports that when he does not sleep, she does not get to sleep either  Minimal tachycardia on low-dose amiodarone  Followed by nephrology Stopped chlorthalidone CR 1.7, BUn 39,   Chronic stable lower extremity swelling, does not wear compression hose No regular exercise program, sedentary, likes to read  EKG personally reviewed by myself on todays visit Normal sinus rhythm rate 66 bpm nonspecific T wave abnormality  Other past medical history reviewed Seen at the [place], told to stop  flecainide secondary to "QT" They wanted to place a loop monitor Patient declined  Episode of tachycardia September 2020 Likely with SVT on 05/24/2019, 3 hours,  rate 140, with associated hypotension Symptoms similar to prior SVT episodes Previously declined ablation  CT scan September 2017 for pneumonia reviewed with him in detail extensive coronary calcifications, all 3 coronary vessels , at least moderate diffuse aortic atherosclerosis  Previous 30 day monitor  showed runs of SVT, heart rate up to 177 beats per minute associated with dizziness. Continued to have runs of SVT even on diltiazem 120 mg daily    PMH:   has a past medical history of Anemia, Aortic atherosclerosis ([place]), Arthritis, Aspiration pneumonia ([place]) (2020), BPH (benign prostatic hypertrophy), CAD (coronary artery disease) (05/23/2016), Cellulitis of face (12/21/2019), Chronic back pain, CKD (chronic kidney disease), stage III ([place]), Colon cancer ([place]) (5809), Diastolic dysfunction (98/33/8250), Diverticulosis, Essential hypertension, First degree AV block, GERD (gastroesophageal reflux disease), H/O calcium pyrophosphate deposition disease (CPPD), Hiatal hernia (01/06/2015), History of kidney stones, HLD (hyperlipidemia), HOH (hard of hearing), Hypokalemia, Impotence of organic origin, Incisional hernia (10/2021), Lewy body dementia ([place]), Peritonsillar abscess determined by examination (05/15/2019), PSVT (paroxysmal supraventricular tachycardia), REM sleep 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  PSH:    Past Surgical History:  Procedure Laterality Date   CATARACT EXTRACTION W/ INTRAOCULAR LENS IMPLANT Bilateral    COLONOSCOPY     [place]  INSERTION OF MESH N/A 11/01/2021   Procedure: INSERTION OF MESH;  Surgeon: Herbert Pun, MD;  Location: [facility] ORS;  Service:  General;  Laterality: N/A;   MASTECTOMY, PARTIAL Left 02/07/2013   LEFT SUBCUTANEOUS MASTECTOMY; Imogene Burn. Georgette Dover, MD   ROTATOR CUFF REPAIR Right 2000    [facility]   TOTAL HIP ARTHROPLASTY Right 03/24/2020   Procedure: TOTAL HIP ARTHROPLASTY ANTERIOR APPROACH;  Surgeon: Paralee Cancel, MD;  Location: [facility];  Service: Orthopedics;  Laterality: Right;  70 mins   TOTAL KNEE ARTHROPLASTY  03/05/2012   Procedure: TOTAL KNEE ARTHROPLASTY;  Surgeon: Gearlean Alf, MD;  Location: [facility];  Service: Orthopedics;  Laterality: Left;   [facility], x 4   VEIN LIGATION AND STRIPPING Right    XI ROBOTIC ASSISTED VENTRAL HERNIA N/A 11/01/2021   Procedure: XI ROBOTIC ASSISTED VENTRAL HERNIA;  Surgeon: Herbert Pun, MD;  Location: [facility] ORS;  Service: General;  Laterality: N/A;    Current Outpatient Medications  Medication Sig Dispense Refill   acetaminophen (TYLENOL) 325 MG tablet Take 325 mg by mouth every 6 (six) hours as needed for moderate pain or headache.     amiodarone (PACERONE) 200 MG tablet Take 0.5 tablets (100 mg total) by mouth daily. 90 tablet 3   camphor-menthol (SARNA) lotion Apply 1 application topically as needed for itching (dry skin).     cholecalciferol (VITAMIN D3) 25 MCG (1000 UNIT) tablet Take 1,000 Units by mouth daily.     diltiazem (CARDIZEM LA) 120 MG 24 hr tablet Take 120 mg by mouth daily.     donepezil (ARICEPT) 10 MG tablet Take 0.5 tablets (5 mg total) by mouth at bedtime. 90 tablet 1   gabapentin (NEURONTIN) 100 MG capsule Take 100 mg by mouth at bedtime.     losartan (COZAAR) 50 MG tablet Take 50 mg by mouth daily.     lovastatin (MEVACOR) 20 MG tablet Take 1 tablet (20 mg total) by mouth daily. 90 tablet 3   mirtazapine (REMERON) 15 MG tablet Take 1 tablet (15 mg total) by mouth at bedtime. 30 tablet 5   Multiple Vitamins-Minerals (CENTRUM SILVER ADULT 50+ PO) Take 1 tablet by mouth daily. Centrum Silver     omeprazole  (PRILOSEC) 20 MG capsule TAKE 1 CAPSULE BY MOUTH EVERY DAY AS NEEDED. 90 capsule 3   oxyCODONE-acetaminophen (PERCOCET) 10-325 MG tablet Take 1 tablet by mouth 3 (three) times daily as needed for pain.     Polyethyl Glycol-Propyl Glycol (SYSTANE OP) Place 1 drop into both eyes at bedtime.     potassium chloride (KLOR-CON) 10 MEQ tablet Take 10 mEq by mouth in the morning, at noon, in the evening, and at bedtime.     Vitamin A 3 MG (10000 UT) TABS Take 10,000 Units by mouth 2 (two) times a week.     zinc gluconate 50 MG tablet Take 50 mg by mouth daily.     oxyCODONE-acetaminophen (PERCOCET) 5-325 MG tablet Take 1 tablet by mouth every 4 (four) hours as needed for severe pain. (Patient not taking: Reported on 07/15/2022) 10 tablet 0   No current facility-administered medications for this visit.    Allergies:   Patient has no known allergies.   Social History:  The patient  reports that he has never smoked. He has never used smokeless tobacco. He reports current alcohol use of about 1.0 standard drink of alcohol per week. He reports that he does not use drugs.  Family History:   family history includes Congestive Heart Failure in his mother; Osteoarthritis in his father.    Review of Systems: Review of Systems  Constitutional: Negative.   HENT: Negative.    Respiratory: Negative.    Cardiovascular: Negative.   Gastrointestinal: Negative.   Musculoskeletal: Negative.   Neurological: Negative.   Psychiatric/Behavioral:  The patient has insomnia.   All other systems reviewed and are negative.   PHYSICAL EXAM: VS:  BP 120/60 (BP Location: Left Arm, Patient Position: Sitting, Cuff Size: Normal)   Pulse 66   Ht 5' 9.5" (1.765 m)   Wt 198 lb 8 oz (90 kg)   SpO2 95%   BMI 28.89 kg/m  , BMI Body mass index is 28.89 kg/m. Constitutional:  oriented to person, place, and time. No distress.  HENT:  Head: Grossly normal Eyes:  no discharge. No scleral icterus.  Neck: No JVD, no carotid  bruits  Cardiovascular: Regular rate and rhythm, no murmurs appreciated Pulmonary/Chest: Clear to auscultation bilaterally, no wheezes or rails Abdominal: Soft.  no distension.  no tenderness.  Musculoskeletal: Normal range of motion Neurological:  normal muscle tone. Coordination normal. No atrophy Skin: Skin warm and dry Psychiatric: normal affect, pleasant   Recent Labs: 05/02/2022: ALT 26; BUN 38; Creatinine, Ser 1.76; Hemoglobin 11.8; Platelets 210; Potassium 3.0; Sodium 139; TSH 8.482    Lipid Panel Lab Results  Component Value Date   CHOL 138 05/07/2020   HDL 64.30 05/07/2020   LDLCALC 54 05/07/2020   TRIG 97.0 05/07/2020      Wt Readings from Last 3 Encounters:  07/15/22 198 lb 8 oz (90 kg)  07/05/22 199 lb (90.3 kg)  05/29/22 190 lb (86.2 kg)     ASSESSMENT AND PLAN:   SVT (supraventricular tachycardia) (HCC) - Tolerating low-dose amiodarone, diltiazem, denies breakthrough tachyarrhythmia Zio monitor with rare episodes of SVT, longest 18 beats No changes to his medications  Coronary artery disease involving native coronary artery of native heart without angina pectoris Heavy coronary calcification seen on CT scan Low risk stress test August 2018 Continue lovastatin 20, consideration of also adding Zetia if numbers do not reach goal LDL less than 70  Aortic atherosclerosis (HCC) Moderate diffuse disease on CT scan September 2017 Recommend he continue lovastatin  Essential hypertension Blood pressure is well controlled on today's visit. No changes made to the medications. Chlorthalidone stopped by nephrology  Insomnia/chronic leg pain, neuropathy Followed by Dr. Herma Mering, reports that taking gabapentin and [name] is not helping his sleep Main complaint today is insomnia chronic issue exacerbated by pain He will call Dr. Herma Mering for refill of his Oxy which he reports when he takes 1 at night helps him sleep Reports recent cortisone shots did not help his  neuropathy pain in fact is worse the past several weeks   Total encounter time more than 30 minutes  Greater than 50% was spent in counseling and coordination of care with the patient   Orders Placed This Encounter  Procedures   EKG 12-Lead     Signed, Esmond Plants, M.D., Ph.D. 07/15/2022  [place], [place] [phone]

## 2022-07-15 ENCOUNTER — Encounter: Payer: Self-pay | Admitting: Cardiovascular Disease

## 2022-07-15 ENCOUNTER — Ambulatory Visit: Payer: Medicare Other | Attending: Cardiovascular Disease | Admitting: Cardiovascular Disease

## 2022-07-15 VITALS — BP 120/60 | HR 66 | Ht 69.5 in | Wt 198.5 lb

## 2022-07-15 DIAGNOSIS — R001 Bradycardia, unspecified: Secondary | ICD-10-CM | POA: Diagnosis not present

## 2022-07-15 DIAGNOSIS — I471 Supraventricular tachycardia, unspecified: Secondary | ICD-10-CM

## 2022-07-15 DIAGNOSIS — I2510 Atherosclerotic heart disease of native coronary artery without angina pectoris: Secondary | ICD-10-CM

## 2022-07-15 DIAGNOSIS — E785 Hyperlipidemia, unspecified: Secondary | ICD-10-CM

## 2022-07-15 DIAGNOSIS — N1832 Chronic kidney disease, stage 3b: Secondary | ICD-10-CM

## 2022-07-15 DIAGNOSIS — I10 Essential (primary) hypertension: Secondary | ICD-10-CM

## 2022-07-15 DIAGNOSIS — I700 Atherosclerosis of aorta: Secondary | ICD-10-CM | POA: Diagnosis not present

## 2022-07-15 NOTE — Patient Instructions (Addendum)
Walk daily  Medication Instructions:  No changes  If you need a refill on your cardiac medications before your next appointment, please call your pharmacy.    Lab work: No new labs needed   Testing/Procedures: No new testing needed   Follow-Up: At [facility], you and your health needs are our priority.  As part of our continuing mission to provide you with exceptional heart care, we have created designated Provider Care Teams.  These Care Teams include your primary Cardiologist (physician) and Advanced Practice Providers (APPs -  Physician Assistants and Nurse Practitioners) who all work together to provide you with the care you need, when you need it.  You will need a follow up appointment in 12 months  Providers on your designated Care Team:   Murray Hodgkins, NP Christell Faith, PA-C Cadence Kathlen Mody, [place]  COVID-19 Vaccine Information can be found at: [email or web address].uk For questions related to vaccine distribution or appointments, please email [email or web address] or call [phone].

## 2022-07-25 DIAGNOSIS — I10 Essential (primary) hypertension: Secondary | ICD-10-CM | POA: Diagnosis not present

## 2022-07-25 DIAGNOSIS — D631 Anemia in chronic kidney disease: Secondary | ICD-10-CM | POA: Diagnosis not present

## 2022-07-25 DIAGNOSIS — N1832 Chronic kidney disease, stage 3b: Secondary | ICD-10-CM | POA: Diagnosis not present

## 2022-07-27 DIAGNOSIS — R059 Cough, unspecified: Secondary | ICD-10-CM | POA: Diagnosis not present

## 2022-07-28 ENCOUNTER — Other Ambulatory Visit: Payer: Self-pay | Admitting: Neurology

## 2022-09-01 DIAGNOSIS — M5416 Radiculopathy, lumbar region: Secondary | ICD-10-CM | POA: Diagnosis not present

## 2022-09-19 DIAGNOSIS — M5416 Radiculopathy, lumbar region: Secondary | ICD-10-CM | POA: Diagnosis not present

## 2022-09-19 DIAGNOSIS — M4856XA Collapsed vertebra, not elsewhere classified, lumbar region, initial encounter for fracture: Secondary | ICD-10-CM | POA: Diagnosis not present

## 2022-09-19 DIAGNOSIS — I700 Atherosclerosis of aorta: Secondary | ICD-10-CM | POA: Diagnosis not present

## 2022-09-19 DIAGNOSIS — G2581 Restless legs syndrome: Secondary | ICD-10-CM | POA: Diagnosis not present

## 2022-09-19 DIAGNOSIS — G894 Chronic pain syndrome: Secondary | ICD-10-CM | POA: Diagnosis not present

## 2022-09-21 DIAGNOSIS — M545 Low back pain, unspecified: Secondary | ICD-10-CM | POA: Diagnosis not present

## 2022-09-21 DIAGNOSIS — G8929 Other chronic pain: Secondary | ICD-10-CM | POA: Diagnosis not present

## 2022-09-21 DIAGNOSIS — R269 Unspecified abnormalities of gait and mobility: Secondary | ICD-10-CM | POA: Diagnosis not present

## 2022-09-21 DIAGNOSIS — G4752 REM sleep behavior disorder: Secondary | ICD-10-CM | POA: Diagnosis not present

## 2022-09-21 DIAGNOSIS — R42 Dizziness and giddiness: Secondary | ICD-10-CM | POA: Diagnosis not present

## 2022-09-21 DIAGNOSIS — G2581 Restless legs syndrome: Secondary | ICD-10-CM | POA: Diagnosis not present

## 2022-09-21 DIAGNOSIS — G4700 Insomnia, unspecified: Secondary | ICD-10-CM | POA: Diagnosis not present

## 2022-09-21 DIAGNOSIS — R2689 Other abnormalities of gait and mobility: Secondary | ICD-10-CM | POA: Diagnosis not present

## 2022-09-21 DIAGNOSIS — R202 Paresthesia of skin: Secondary | ICD-10-CM | POA: Diagnosis not present

## 2022-09-21 DIAGNOSIS — R200 Anesthesia of skin: Secondary | ICD-10-CM | POA: Diagnosis not present

## 2022-09-29 DIAGNOSIS — G4700 Insomnia, unspecified: Secondary | ICD-10-CM | POA: Diagnosis not present

## 2022-09-29 DIAGNOSIS — R42 Dizziness and giddiness: Secondary | ICD-10-CM | POA: Diagnosis not present

## 2022-09-29 DIAGNOSIS — Z556 Problems related to health literacy: Secondary | ICD-10-CM | POA: Diagnosis not present

## 2022-09-29 DIAGNOSIS — G309 Alzheimer's disease, unspecified: Secondary | ICD-10-CM | POA: Diagnosis not present

## 2022-09-29 DIAGNOSIS — I471 Supraventricular tachycardia, unspecified: Secondary | ICD-10-CM | POA: Diagnosis not present

## 2022-09-29 DIAGNOSIS — G8929 Other chronic pain: Secondary | ICD-10-CM | POA: Diagnosis not present

## 2022-09-29 DIAGNOSIS — I129 Hypertensive chronic kidney disease with stage 1 through stage 4 chronic kidney disease, or unspecified chronic kidney disease: Secondary | ICD-10-CM | POA: Diagnosis not present

## 2022-09-29 DIAGNOSIS — Z9181 History of falling: Secondary | ICD-10-CM | POA: Diagnosis not present

## 2022-09-29 DIAGNOSIS — S32030S Wedge compression fracture of third lumbar vertebra, sequela: Secondary | ICD-10-CM | POA: Diagnosis not present

## 2022-09-29 DIAGNOSIS — G4752 REM sleep behavior disorder: Secondary | ICD-10-CM | POA: Diagnosis not present

## 2022-09-29 DIAGNOSIS — Z96641 Presence of right artificial hip joint: Secondary | ICD-10-CM | POA: Diagnosis not present

## 2022-09-29 DIAGNOSIS — N189 Chronic kidney disease, unspecified: Secondary | ICD-10-CM | POA: Diagnosis not present

## 2022-09-29 DIAGNOSIS — F02818 Dementia in other diseases classified elsewhere, unspecified severity, with other behavioral disturbance: Secondary | ICD-10-CM | POA: Diagnosis not present

## 2022-10-06 ENCOUNTER — Telehealth: Payer: Self-pay | Admitting: Cardiovascular Disease

## 2022-10-06 NOTE — Telephone Encounter (Signed)
Amiodarone can potentially increase the amount of tizanidine in his system. All 3 can lower HR. I would recommend he start with 1/2 tablet ('1mg'$ ) of tizanidine or ask to switch to methocarbamol.

## 2022-10-06 NOTE — Telephone Encounter (Signed)
Pt c/o medication issue:  1. Name of Medication:   amiodarone (PACERONE) 200 MG tablet  diltiazem (CARDIZEM LA) 120 MG 24 hr tablet   2. How are you currently taking this medication (dosage and times per day)? As prescribed  3. Are you having a reaction (difficulty breathing--STAT)?   No  4. What is your medication issue?   Caller stated patient recently had Tizanidine added to his medication list and is concerned this muscle relaxer may interact with the these medications.

## 2022-10-06 NOTE — Telephone Encounter (Signed)
To Pharm D to review.

## 2022-10-07 NOTE — Telephone Encounter (Signed)
I called and spoke with Elmyra Ricks with [facility]. I have advised her of pharm D recommendations as stated below. Elmyra Ricks voices understanding and will forward to PCP for review.

## 2022-11-12 ENCOUNTER — Other Ambulatory Visit: Payer: Self-pay

## 2022-11-12 ENCOUNTER — Inpatient Hospital Stay
Admission: EM | Admit: 2022-11-12 | Discharge: 2022-11-17 | DRG: 177 | Disposition: A | Discharge status: Home Health | Payer: No Typology Code available for payment source | Attending: Student | Admitting: Student

## 2022-11-12 ENCOUNTER — Encounter: Payer: Self-pay | Admitting: Emergency Medicine

## 2022-11-12 ENCOUNTER — Emergency Department: Payer: No Typology Code available for payment source

## 2022-11-12 DIAGNOSIS — F02811 Dementia in other diseases classified elsewhere, unspecified severity, with agitation: Secondary | ICD-10-CM | POA: Diagnosis present

## 2022-11-12 DIAGNOSIS — Z85038 Personal history of other malignant neoplasm of large intestine: Secondary | ICD-10-CM

## 2022-11-12 DIAGNOSIS — Z8249 Family history of ischemic heart disease and other diseases of the circulatory system: Secondary | ICD-10-CM | POA: Diagnosis not present

## 2022-11-12 DIAGNOSIS — E785 Hyperlipidemia, unspecified: Secondary | ICD-10-CM | POA: Diagnosis present

## 2022-11-12 DIAGNOSIS — I2510 Atherosclerotic heart disease of native coronary artery without angina pectoris: Secondary | ICD-10-CM | POA: Diagnosis present

## 2022-11-12 DIAGNOSIS — I129 Hypertensive chronic kidney disease with stage 1 through stage 4 chronic kidney disease, or unspecified chronic kidney disease: Secondary | ICD-10-CM | POA: Diagnosis present

## 2022-11-12 DIAGNOSIS — E876 Hypokalemia: Secondary | ICD-10-CM | POA: Diagnosis not present

## 2022-11-12 DIAGNOSIS — Z1152 Encounter for screening for COVID-19: Secondary | ICD-10-CM | POA: Diagnosis not present

## 2022-11-12 DIAGNOSIS — K219 Gastro-esophageal reflux disease without esophagitis: Secondary | ICD-10-CM | POA: Diagnosis present

## 2022-11-12 DIAGNOSIS — N1832 Chronic kidney disease, stage 3b: Secondary | ICD-10-CM | POA: Diagnosis present

## 2022-11-12 DIAGNOSIS — G3183 Dementia with Lewy bodies: Secondary | ICD-10-CM | POA: Diagnosis present

## 2022-11-12 DIAGNOSIS — G4700 Insomnia, unspecified: Secondary | ICD-10-CM | POA: Diagnosis present

## 2022-11-12 DIAGNOSIS — G629 Polyneuropathy, unspecified: Secondary | ICD-10-CM | POA: Diagnosis present

## 2022-11-12 DIAGNOSIS — J189 Pneumonia, unspecified organism: Principal | ICD-10-CM

## 2022-11-12 DIAGNOSIS — I10 Essential (primary) hypertension: Secondary | ICD-10-CM | POA: Diagnosis not present

## 2022-11-12 DIAGNOSIS — F0280 Dementia in other diseases classified elsewhere without behavioral disturbance: Secondary | ICD-10-CM | POA: Diagnosis not present

## 2022-11-12 DIAGNOSIS — N183 Chronic kidney disease, stage 3 unspecified: Secondary | ICD-10-CM | POA: Diagnosis present

## 2022-11-12 DIAGNOSIS — J690 Pneumonitis due to inhalation of food and vomit: Secondary | ICD-10-CM | POA: Diagnosis present

## 2022-11-12 DIAGNOSIS — N179 Acute kidney failure, unspecified: Secondary | ICD-10-CM | POA: Diagnosis present

## 2022-11-12 DIAGNOSIS — Z96641 Presence of right artificial hip joint: Secondary | ICD-10-CM | POA: Diagnosis present

## 2022-11-12 DIAGNOSIS — N400 Enlarged prostate without lower urinary tract symptoms: Secondary | ICD-10-CM | POA: Diagnosis present

## 2022-11-12 DIAGNOSIS — Z96652 Presence of left artificial knee joint: Secondary | ICD-10-CM | POA: Diagnosis present

## 2022-11-12 DIAGNOSIS — Z974 Presence of external hearing-aid: Secondary | ICD-10-CM | POA: Diagnosis not present

## 2022-11-12 DIAGNOSIS — H9190 Unspecified hearing loss, unspecified ear: Secondary | ICD-10-CM | POA: Diagnosis present

## 2022-11-12 DIAGNOSIS — Z87442 Personal history of urinary calculi: Secondary | ICD-10-CM | POA: Diagnosis not present

## 2022-11-12 DIAGNOSIS — T17908A Unspecified foreign body in respiratory tract, part unspecified causing other injury, initial encounter: Secondary | ICD-10-CM

## 2022-11-12 DIAGNOSIS — I471 Supraventricular tachycardia, unspecified: Secondary | ICD-10-CM | POA: Diagnosis present

## 2022-11-12 DIAGNOSIS — G8929 Other chronic pain: Secondary | ICD-10-CM | POA: Diagnosis present

## 2022-11-12 DIAGNOSIS — R197 Diarrhea, unspecified: Secondary | ICD-10-CM | POA: Diagnosis not present

## 2022-11-12 DIAGNOSIS — J9601 Acute respiratory failure with hypoxia: Secondary | ICD-10-CM | POA: Insufficient documentation

## 2022-11-12 DIAGNOSIS — F02818 Dementia in other diseases classified elsewhere, unspecified severity, with other behavioral disturbance: Secondary | ICD-10-CM | POA: Diagnosis present

## 2022-11-12 DIAGNOSIS — R1110 Vomiting, unspecified: Secondary | ICD-10-CM | POA: Diagnosis present

## 2022-11-12 DIAGNOSIS — I4719 Other supraventricular tachycardia: Secondary | ICD-10-CM | POA: Diagnosis present

## 2022-11-12 DIAGNOSIS — Z79899 Other long term (current) drug therapy: Secondary | ICD-10-CM

## 2022-11-12 LAB — CBC WITH DIFFERENTIAL/PLATELET
Abs Immature Granulocytes: 0.01 10*3/uL (ref 0.00–0.07)
Basophils Absolute: 0 10*3/uL (ref 0.0–0.1)
Basophils Relative: 1 %
Eosinophils Absolute: 0 10*3/uL (ref 0.0–0.5)
Eosinophils Relative: 1 %
HCT: 42 % (ref 39.0–52.0)
Hemoglobin: 13.5 g/dL (ref 13.0–17.0)
Immature Granulocytes: 0 %
Lymphocytes Relative: 15 %
Lymphs Abs: 0.9 10*3/uL (ref 0.7–4.0)
MCH: 32 pg (ref 26.0–34.0)
MCHC: 32.1 g/dL (ref 30.0–36.0)
MCV: 99.5 fL (ref 80.0–100.0)
Monocytes Absolute: 0.5 10*3/uL (ref 0.1–1.0)
Monocytes Relative: 8 %
Neutro Abs: 4.6 10*3/uL (ref 1.7–7.7)
Neutrophils Relative %: 75 %
Platelets: 302 10*3/uL (ref 150–400)
RBC: 4.22 MIL/uL (ref 4.22–5.81)
RDW: 14.6 % (ref 11.5–15.5)
WBC: 6 10*3/uL (ref 4.0–10.5)
nRBC: 0.5 % — ABNORMAL HIGH (ref 0.0–0.2)

## 2022-11-12 LAB — URINALYSIS, W/ REFLEX TO CULTURE (INFECTION SUSPECTED)
Bacteria, UA: NONE SEEN
Bilirubin Urine: NEGATIVE
Glucose, UA: NEGATIVE mg/dL
Ketones, ur: NEGATIVE mg/dL
Leukocytes,Ua: NEGATIVE
Nitrite: NEGATIVE
Protein, ur: 30 mg/dL — AB
Specific Gravity, Urine: 1.011 (ref 1.005–1.030)
pH: 5 (ref 5.0–8.0)

## 2022-11-12 LAB — EXPECTORATED SPUTUM ASSESSMENT W GRAM STAIN, RFLX TO RESP C

## 2022-11-12 LAB — PROTIME-INR
INR: 1 (ref 0.8–1.2)
Prothrombin Time: 13.5 seconds (ref 11.4–15.2)

## 2022-11-12 LAB — COMPREHENSIVE METABOLIC PANEL
ALT: 21 U/L (ref 0–44)
AST: 39 U/L (ref 15–41)
Albumin: 3.9 g/dL (ref 3.5–5.0)
Alkaline Phosphatase: 58 U/L (ref 38–126)
Anion gap: 11 (ref 5–15)
BUN: 41 mg/dL — ABNORMAL HIGH (ref 8–23)
CO2: 24 mmol/L (ref 22–32)
Calcium: 9.2 mg/dL (ref 8.9–10.3)
Chloride: 104 mmol/L (ref 98–111)
Creatinine, Ser: 1.88 mg/dL — ABNORMAL HIGH (ref 0.61–1.24)
GFR, Estimated: 34 mL/min — ABNORMAL LOW (ref >60)
Glucose, Bld: 120 mg/dL — ABNORMAL HIGH (ref 70–99)
Potassium: 3.7 mmol/L (ref 3.5–5.1)
Sodium: 139 mmol/L (ref 135–145)
Total Bilirubin: 0.7 mg/dL (ref 0.3–1.2)
Total Protein: 6.7 g/dL (ref 6.5–8.1)

## 2022-11-12 LAB — BLOOD GAS, ARTERIAL
Acid-Base Excess: 1.8 mmol/L (ref 0.0–2.0)
Bicarbonate: 28.6 mmol/L — ABNORMAL HIGH (ref 20.0–28.0)
O2 Content: 11 L/min
O2 Saturation: 92.7 %
Patient temperature: 37
pCO2 arterial: 53 mmHg — ABNORMAL HIGH (ref 32–48)
pH, Arterial: 7.34 — ABNORMAL LOW (ref 7.35–7.45)
pO2, Arterial: 67 mmHg — ABNORMAL LOW (ref 83–108)

## 2022-11-12 LAB — TROPONIN I (HIGH SENSITIVITY)
Troponin I (High Sensitivity): 11 ng/L (ref <18)
Troponin I (High Sensitivity): 12 ng/L (ref <18)

## 2022-11-12 LAB — LACTIC ACID, PLASMA
Lactic Acid, Venous: 1.9 mmol/L (ref 0.5–1.9)
Lactic Acid, Venous: 1.3 mmol/L (ref 0.5–1.9)

## 2022-11-12 LAB — RESP PANEL BY RT-PCR (RSV, FLU A&B, COVID)  RVPGX2
Influenza A by PCR: NEGATIVE
Influenza B by PCR: NEGATIVE
Resp Syncytial Virus by PCR: NEGATIVE
SARS Coronavirus 2 by RT PCR: NEGATIVE

## 2022-11-12 LAB — PROCALCITONIN: Procalcitonin: <0.1 ng/mL

## 2022-11-12 LAB — STREP PNEUMONIAE URINARY ANTIGEN: Strep Pneumo Urinary Antigen: NEGATIVE

## 2022-11-12 LAB — MAGNESIUM: Magnesium: 2.4 mg/dL (ref 1.7–2.4)

## 2022-11-12 LAB — BRAIN NATRIURETIC PEPTIDE: B Natriuretic Peptide: 210.1 pg/mL — ABNORMAL HIGH (ref 0.0–100.0)

## 2022-11-12 LAB — APTT: aPTT: 26 seconds (ref 24–36)

## 2022-11-12 MED ORDER — SODIUM CHLORIDE 0.9 % IV SOLN: INTRAVENOUS | Status: DC

## 2022-11-12 MED ORDER — SODIUM CHLORIDE 0.9 % IV SOLN
2.0000 g | INTRAVENOUS | Status: AC
  Administered 2022-11-13 – 2022-11-17 (×5): 2 g via INTRAVENOUS
  Filled 2022-11-12: qty 2
  Filled 2022-11-12: qty 20
  Filled 2022-11-12 (×3): qty 2

## 2022-11-12 MED ORDER — SODIUM CHLORIDE 0.9 % IV SOLN
1.0000 g | Freq: Once | INTRAVENOUS | Status: AC
  Administered 2022-11-12: 1 g via INTRAVENOUS
  Filled 2022-11-12: qty 10

## 2022-11-12 MED ORDER — ONDANSETRON HCL 4 MG PO TABS: 4.0000 mg | ORAL_TABLET | Freq: Four times a day (QID) | ORAL | Status: DC | PRN

## 2022-11-12 MED ORDER — METRONIDAZOLE 500 MG/100ML IV SOLN
500.0000 mg | Freq: Once | INTRAVENOUS | Status: AC
  Administered 2022-11-12: 500 mg via INTRAVENOUS
  Filled 2022-11-12: qty 100

## 2022-11-12 MED ORDER — METRONIDAZOLE 500 MG/100ML IV SOLN
500.0000 mg | Freq: Two times a day (BID) | INTRAVENOUS | Status: DC
  Administered 2022-11-12 – 2022-11-16 (×9): 500 mg via INTRAVENOUS
  Filled 2022-11-12 (×10): qty 100

## 2022-11-12 MED ORDER — ONDANSETRON HCL 4 MG/2ML IJ SOLN
INTRAMUSCULAR | Status: AC
  Administered 2022-11-12: 4 mg via INTRAVENOUS
  Filled 2022-11-12: qty 2

## 2022-11-12 MED ORDER — SODIUM CHLORIDE 0.9 % IV SOLN
500.0000 mg | Freq: Once | INTRAVENOUS | Status: AC
  Administered 2022-11-12: 500 mg via INTRAVENOUS
  Filled 2022-11-12: qty 5

## 2022-11-12 MED ORDER — SODIUM CHLORIDE 0.9 % IV SOLN
500.0000 mg | INTRAVENOUS | Status: AC
  Administered 2022-11-13 – 2022-11-17 (×5): 500 mg via INTRAVENOUS
  Filled 2022-11-12 (×2): qty 500
  Filled 2022-11-12: qty 5
  Filled 2022-11-12: qty 500
  Filled 2022-11-12: qty 5

## 2022-11-12 MED ORDER — IPRATROPIUM-ALBUTEROL 0.5-2.5 (3) MG/3ML IN SOLN: 3.0000 mL | RESPIRATORY_TRACT | Status: DC | PRN

## 2022-11-12 MED ORDER — ENOXAPARIN SODIUM 40 MG/0.4ML IJ SOSY
40.0000 mg | PREFILLED_SYRINGE | INTRAMUSCULAR | Status: DC
  Administered 2022-11-12 – 2022-11-16 (×5): 40 mg via SUBCUTANEOUS
  Filled 2022-11-12 (×5): qty 0.4

## 2022-11-12 MED ORDER — ONDANSETRON HCL 4 MG/2ML IJ SOLN: 4.0000 mg | Freq: Four times a day (QID) | INTRAMUSCULAR | Status: DC | PRN

## 2022-11-12 MED ORDER — ONDANSETRON HCL 4 MG/2ML IJ SOLN: 4.0000 mg | Freq: Once | INTRAMUSCULAR | Status: AC

## 2022-11-12 NOTE — Assessment & Plan Note (Signed)
Per the wife, worsening confusion over the past few days in the setting of aspiration event Currently sleeping Follow for now

## 2022-11-12 NOTE — Assessment & Plan Note (Signed)
New onset hypoxia with noted white left lung with recent major vomiting event early in the morning High suspicion for aspiration pneumonia COVID, flu, RSV negative Satting in the upper 90s with O2 requirements vacillating between 7 L high flow nasal cannula and nonrebreather Patient resting at the bedside with mild improvement in baseline respiratory status White count and procalcitonin within normal limits in the ER. Started on IV Rocephin azithromycin and Flagyl in the ER Will continue w/ rocephin and azithromycin  De-escalate as appropriate Continue supplemental oxygen Blood cultures obtained in ER Follow

## 2022-11-12 NOTE — ED Provider Notes (Signed)
-----------------------------------------   8:19 AM on 11/12/2022 -----------------------------------------  I took over care on this patient from Dr. Tamala Julian.  Chest x-ray shows opacity throughout the left lung.  Lab workup is overall reassuring with normal leukocytosis and normal lactate.  Respiratory panel is negative.  Overall presentation favors aspiration pneumonia.  On reassessment, the patient appears relatively comfortable and does not demonstrate any increased work of breathing.  O2 saturation is 100% and I decreased the nonrebreather to 10 L.  I have called respiratory to switch the patient to high flow nasal cannula.  I consulted Dr. Ernestina Patches from the hospitalist service; based on our discussion he agrees to admit the patient.   Arta Silence, MD 11/12/22 [phone]

## 2022-11-12 NOTE — Assessment & Plan Note (Addendum)
Heart rate stable in 80s Continue home amiodarone and Cardizem pending bedside swallow eval  Prn IV dilt may be needed for an acute episode of SVT while pt NPO as BP tolerates  Follow

## 2022-11-12 NOTE — ED Triage Notes (Signed)
  Patient BIB EMS from home with respiratory distress that started several hours before arrival.  EMS states patient SPO2 was in 60's and patient was very lethargic.  125 mg solumedrol and 2g mag given en route.  Patient placed on bipap.  On arrival patient had large emesis episodes with bipap mask on.  Converted to NRB and tolerating well.  No complaints of pain.

## 2022-11-12 NOTE — ED Triage Notes (Signed)
Pt from home with a CC of increasing SOB. Pt does have a hx of CHF. EMS placed pt on bipap r/t hypoxia and diff breathing and administered Mag, Solumedrol, and 1 albuterol treatment en route.

## 2022-11-12 NOTE — Assessment & Plan Note (Signed)
BP stable Titrate home regimen 

## 2022-11-12 NOTE — Assessment & Plan Note (Addendum)
Baseline creatinine 1.4-1.7 Creatinine 1.88 today Borderline decompensation Will hydrate Follow Hold nephrotoxic agents

## 2022-11-12 NOTE — ED Notes (Signed)
RT called to place pt on HFNC per EDP

## 2022-11-12 NOTE — Assessment & Plan Note (Signed)
New onset hypoxia w/ concern for aspiration event  Satting in the upper 90s with O2 requirements vacillating between 7 L high flow nasal cannula and nonrebreather  WBC 6, BNP mildly elevated in 200s without volume overload clinically.  CXR w/ severe L sided PNA S/p rocephin, flagyl and azithromycin in the ER  IV rocephin and azithromycin for infectious coverage in the interim  Narrow treatment as clinically appropriate Supplemental O2  Prn duonebs  Follow

## 2022-11-12 NOTE — Assessment & Plan Note (Signed)
New onset hypoxia with noted white left lung with recent major vomiting event early in the morning High suspicion for aspiration pneumonia COVID, flu, RSV negative Satting in the upper 90s with O2 requirements vacillating between 7 L high flow nasal cannula and nonrebreather Patient resting at the bedside with mild improvement in baseline respiratory status White count and procalcitonin within normal limits in the ER. Started on IV Rocephin azithromycin and Flagyl in the ER Will continue De-escalate as appropriate Continue supplemental oxygen Blood cultures obtained in ER Follow

## 2022-11-12 NOTE — Assessment & Plan Note (Addendum)
No reported chest pain Troponin negative x 1 EKG stable Follow Continue home regimen pending swallow eval

## 2022-11-12 NOTE — ED Provider Notes (Signed)
[facility] Provider Note    Event Date/Time   First MD Initiated Contact with Patient 11/12/22 [phone]     (approximate)   History   Respiratory Distress and Shortness of Breath   HPI  Michael [name] is a 89 y.o. male who presents to the ED for evaluation of Respiratory Distress and Shortness of Breath   I reviewed PCP visit from 11/15.  History of Lewy body dementia, HTN, CKD, CAD imaging.  SVT on amiodarone.  Patient presents from home via EMS for evaluation of shortness of breath.  EMS reports finding him with sats in the 50s on room air.  They provided DuoNeb, Solu-Medrol and magnesium sulfate IV and placed the patient on CPAP.  On arrival to the ED, when trying to transfer over to our stretcher on the CPAP, he has large volume emesis of food products in the mask that we clears quickly as possible and take the mask off.  No hematemesis or coffee-ground emesis.  History somewhat limited by respiratory status and his dementia.  After CPAP is removed to begin cleaned up, placed on the monitor on room air he does have sats with a good waveform in the 50s so he is immediately placed on nonrebreather.  Physical Exam   Triage Vital Signs: ED Triage Vitals  Enc Vitals Group     BP      Pulse      Resp      Temp      Temp src      SpO2      Weight      Height      Head Circumference      Peak Flow      Pain Score      Pain Loc      Pain Edu?      Excl. in [place]?     Most recent vital signs: Vitals:   11/12/22 0643 11/12/22 0644  BP:  (!) 165/83  Pulse:  92  Resp:  20  Temp:  99.3 F (37.4 C)  SpO2: 93% 95%    General: Sitting upright, shivering and vomiting.  Does follow simple commands in all 4 extremities CV:  Good peripheral perfusion.  Resp:  Tachypneic and only 30.  Coarse breath sounds bilaterally Abd:  No distention.  Soft MSK:  No deformity noted.  Pitting edema to bilateral lower extremities Neuro:  No focal deficits  appreciated. Other:     ED Results / Procedures / Treatments   Labs (all labs ordered are listed, but only abnormal results are displayed) Labs Reviewed  CBC WITH DIFFERENTIAL/PLATELET - Abnormal; Notable for the following components:      Result Value   nRBC 0.5 (*)    All other components within normal limits  BLOOD GAS, ARTERIAL - Abnormal; Notable for the following components:   pH, Arterial 7.34 (*)    pCO2 arterial 53 (*)    pO2, Arterial 67 (*)    Bicarbonate 28.6 (*)    All other components within normal limits  CULTURE, BLOOD (ROUTINE X 2)  CULTURE, BLOOD (ROUTINE X 2)  RESP PANEL BY RT-PCR (RSV, FLU A&B, COVID)  RVPGX2  PROTIME-INR  APTT  LACTIC ACID, PLASMA  LACTIC ACID, PLASMA  COMPREHENSIVE METABOLIC PANEL  URINALYSIS, W/ REFLEX TO CULTURE (INFECTION SUSPECTED)  PROCALCITONIN  BRAIN NATRIURETIC PEPTIDE  MAGNESIUM  TROPONIN I (HIGH SENSITIVITY)    EKG Sinus tachycardia with a rate of 100 bpm.  Normal  axis.  Prolonged QTc of 551 seconds.  No high-grade AV block.  Nonspecific ST changes with biphasic T waves inferiorly and laterally.  No clear STEMI.  Tremulous baseline.  RADIOLOGY 1 view CXR interpreted by me with diffuse opacities throughout the left-sided lung fields  Official radiology report(s): DG Chest Port 1 View  Result Date: 11/12/2022 CLINICAL DATA:  Hypoxia EXAM: PORTABLE CHEST 1 VIEW COMPARISON:  02/19/2020 FINDINGS: Severe airspace disease on the left. Mild cardiomegaly accentuated by technique. No effusion or pneumothorax. IMPRESSION: Severe pneumonia on the left. Electronically Signed   By: Jorje Guild M.D.   On: 11/12/2022 07:07    PROCEDURES and INTERVENTIONS:  .1-3 Lead EKG Interpretation  Performed by: Vladimir Crofts, MD Authorized by: Vladimir Crofts, MD     Interpretation: normal     ECG rate:  90   ECG rate assessment: normal     Rhythm: sinus rhythm     Ectopy: none     Conduction: normal   .Critical Care  Performed by: Vladimir Crofts, MD Authorized by: Vladimir Crofts, MD   Critical care provider statement:    Critical care time (minutes):  30   Critical care time was exclusive of:  Separately billable procedures and treating other patients   Critical care was necessary to treat or prevent imminent or life-threatening deterioration of the following conditions:  Respiratory failure   Critical care was time spent personally by me on the following activities:  Development of treatment plan with patient or surrogate, discussions with consultants, evaluation of patient's response to treatment, examination of patient, ordering and review of laboratory studies, ordering and review of radiographic studies, ordering and performing treatments and interventions, pulse oximetry, re-evaluation of patient's condition and review of old charts   Medications  cefTRIAXone (ROCEPHIN) 1 g in sodium chloride 0.9 % 100 mL IVPB (has no administration in time range)  azithromycin (ZITHROMAX) 500 mg in sodium chloride 0.9 % 250 mL IVPB (has no administration in time range)  ondansetron (ZOFRAN) injection 4 mg (4 mg Intravenous Given 11/12/22 0649)     IMPRESSION / MDM / [place] / ED COURSE  I reviewed the triage vital signs and the nursing notes.  Differential diagnosis includes, but is not limited to, CHF exacerbation, aspiration pneumonia, sepsis, ACS  {Patient presents with symptoms of an acute illness or injury that is potentially life-threatening.  89 year old male presents from home with hypoxic respiratory failure with evidence of commune acquired pneumonia throughout the left-sided lung fields, possibly aspiration pneumonia, requiring medical admission.  Quite hypoxic to the 50s on room air, improving with NRB.  No significant distress, tripoding or indications for another attempt at an PPV or no clear indications for endotracheal intubation at this point but wife does confirm that he is a full code.  Remains stable without  signs of shock.  Blood work returning around the time of signout without leukocytosis or significant hematologic derangements.  We will add CAP coverage antibiotics and consult medicine for admission after the remainder of his workup returns.      FINAL CLINICAL IMPRESSION(S) / ED DIAGNOSES   Final diagnoses:  Community acquired pneumonia of left lung, unspecified part of lung  Acute respiratory failure with hypoxia ([place])  Aspiration into airway, initial encounter     Rx / DC Orders   ED Discharge Orders     None        Note:  This document was prepared using Dragon voice recognition software and may include  unintentional dictation errors.   Vladimir Crofts, MD 11/12/22 [phone]

## 2022-11-12 NOTE — H&P (Addendum)
History and Physical    Patient: Michael [name] [id number] DOB: 1931/11/18 DOA: 11/12/2022 DOS: the patient was seen and examined on 11/12/2022 PCP: Juluis Pitch, MD  Patient coming from: Home  Chief Complaint:  Chief Complaint  Patient presents with   Respiratory Distress   Shortness of Breath   HPI: Michael [name] is a 89 y.o. male with medical history significant of multiple medical issues including coronary artery disease, stage III CKD, diastolic dysfunction, history of SVT, GERD, Lewy body dementia presenting with aspiration pneumonia.  History primarily from patient's wife Konstantin Heiges.  Per the wife, patient woke up to use the bathroom around 3 AM with significant weakness.  Patient has significant episodes of vomiting.  Because of the vomiting and weakness, the wife called EMS with patient having another episode of vomiting on EMS arrival.  Per the wife, patient has had overall worsening lethargy and confusion over the past 2 to 3 days.  Positive mild chills.  No chest pain or belly pain.  No reported new medication changes.  No reports of falls or head trauma. Presented to the ER afebrile, hemodynamically stable.  Requiring 15 L nonrebreather to keep O2 sats greater than 95% initially.  ABG grossly stable.  Creatinine 1.9.  White count 6, hemoglobin 13.5.  Respiratory panel of negative for COVID flu and RSV.  Troponin negative x 2.  Chest x-ray with severe pneumonia on the left side.  Started on Rocephin, azithromycin and Flagyl in the ER. Review of Systems: As mentioned in the history of present illness. All other systems reviewed and are negative. Past Medical History:  Diagnosis Date   Anemia    Aortic atherosclerosis (HCC)    Arthritis    Aspiration pneumonia ([place]) 2020   BPH (benign prostatic hypertrophy)    CAD (coronary artery disease) 05/23/2016   a.) CT 05/23/2016: extensive 3v CAD.   Cellulitis of face 12/21/2019   Chronic back pain    CKD (chronic kidney disease),  stage III (HCC)    Colon cancer ([place]) 1988   Surgery alone, no history of chemo   Diastolic dysfunction [id number]   a.) TTE 05/23/2014; EF 63%; G1DD. b.) TTE 10/27/2014: EF 61%, mild LA dilation; G2DD. c.) TTE 04/10/2020: EF >55%. GLS -20.3%. Triv panvalvular regurgitation; no stenosis.   Diverticulosis    Essential hypertension    First degree AV block    GERD (gastroesophageal reflux disease)    H/O calcium pyrophosphate deposition disease (CPPD)    Hiatal hernia 01/06/2015   History of kidney [name]    HLD (hyperlipidemia)    HOH (hard of hearing)    bilateral hearing aids   Hypokalemia    Impotence of organic origin    Incisional hernia 10/2021   Lewy body dementia ([place])    Peritonsillar abscess determined by examination 05/15/2019   S/p abx, I&D by ENT 05/2019   PSVT (paroxysmal supraventricular tachycardia)    a. 04/2014 Event Monitor: Freq bouts of SVT with rates to 180-->seen by EP with recommendation for RFCA, pt deferred;  b. 05/2014 Echo: EF nl, mild conc LVH, no rwma, Gr1 DD, mildMR/TR.   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   Rotator cuff rupture    Right; s/p repair   Unspecified hearing loss    Ventral hernia 1988   Past Surgical History:  Procedure Laterality Date   CATARACT EXTRACTION W/ INTRAOCULAR LENS IMPLANT Bilateral    COLONOSCOPY     HEMICOLOECTOMY W/ ANASTOMOSIS  1988   [facility]  INGUINAL HERNIA REPAIR Right 1963   INSERTION OF MESH N/A 11/01/2021   Procedure: INSERTION OF MESH;  Surgeon: Herbert Pun, MD;  Location: [facility] ORS;  Service: General;  Laterality: N/A;   MASTECTOMY, PARTIAL Left 02/07/2013   LEFT SUBCUTANEOUS MASTECTOMY; Imogene Burn. Georgette Dover, MD   ROTATOR CUFF REPAIR Right 2000    [facility]   TOTAL HIP ARTHROPLASTY Right 03/24/2020   Procedure: TOTAL HIP ARTHROPLASTY ANTERIOR APPROACH;  Surgeon: Paralee Cancel, MD;  Location: [facility];  Service: Orthopedics;  Laterality: Right;  70 mins   TOTAL KNEE ARTHROPLASTY  03/05/2012    Procedure: TOTAL KNEE ARTHROPLASTY;  Surgeon: Gearlean Alf, MD;  Location: [facility];  Service: Orthopedics;  Laterality: Left;   [place] Surgery, x 4   VEIN LIGATION AND STRIPPING Right    XI ROBOTIC ASSISTED VENTRAL HERNIA N/A 11/01/2021   Procedure: XI ROBOTIC ASSISTED VENTRAL HERNIA;  Surgeon: Herbert Pun, MD;  Location: [facility] ORS;  Service: General;  Laterality: N/A;   Social History:  reports that he has never smoked. He has never used smokeless tobacco. He reports current alcohol use of about 1.0 standard drink of alcohol per week. He reports that he does not use drugs.  No Known Allergies  Family History  Problem Relation Age of Onset   Osteoarthritis Father    Congestive Heart Failure Mother     Prior to Admission medications   Medication Sig Start Date End Date Taking? Authorizing Provider  acetaminophen (TYLENOL) 325 MG tablet Take 325 mg by mouth every 6 (six) hours as needed for moderate pain or headache.    [provider]  amiodarone (PACERONE) 200 MG tablet Take 0.5 tablets (100 mg total) by mouth daily. 05/02/22   Dunn, Areta Haber, PA-C  camphor-menthol [facility]) lotion Apply 1 application topically as needed for itching (dry skin). 11/12/20   [provider]  cholecalciferol (VITAMIN D3) 25 MCG (1000 UNIT) tablet Take 1,000 Units by mouth daily.    [provider]  diltiazem (CARDIZEM LA) 120 MG 24 hr tablet Take 120 mg by mouth daily.    [provider]  donepezil (ARICEPT) 10 MG tablet Take 0.5 tablets (5 mg total) by mouth at bedtime. 05/30/22   Lomax, [name], NP  gabapentin (NEURONTIN) 100 MG capsule Take 100 mg by mouth at bedtime.    [provider]  losartan (COZAAR) 50 MG tablet Take 50 mg by mouth daily.    [provider]  lovastatin (MEVACOR) 20 MG tablet Take 1 tablet (20 mg total) by mouth daily. 09/21/21   Minna Merritts, MD  mirtazapine (REMERON) 15 MG tablet Take 1  tablet (15 mg total) by mouth at bedtime. 07/05/22   Sater, Nanine Means, MD  Multiple Vitamins-Minerals (CENTRUM SILVER ADULT 50+ PO) Take 1 tablet by mouth daily. Centrum Silver    [provider]  omeprazole (PRILOSEC) 20 MG capsule TAKE 1 CAPSULE BY MOUTH EVERY DAY AS NEEDED. 11/07/18   Copland, Frederico Hamman, MD  oxyCODONE-acetaminophen (PERCOCET) 10-325 MG tablet Take 1 tablet by mouth 3 (three) times daily as needed for pain. 09/23/21   [provider]  Polyethyl Glycol-Propyl Glycol (SYSTANE OP) Place 1 drop into both eyes at bedtime.    [provider]  potassium chloride (KLOR-CON) 10 MEQ tablet Take 10 mEq by mouth in the morning, at noon, in the evening, and at bedtime.    [provider]  Vitamin A 3 MG (10000 UT) TABS  Take 10,000 Units by mouth 2 (two) times a week.    [provider]  zinc gluconate 50 MG tablet Take 50 mg by mouth daily.    [provider]    Physical Exam: Vitals:   11/12/22 0730 11/12/22 0822 11/12/22 0832 11/12/22 0900  BP: (!) 137/57  (!) 112/56 108/72  Pulse: 82 71 71 70  Resp: (!) '24 14 13 11  '$ Temp:      TempSrc:      SpO2: 100% 99% 98% 100%  Weight:      Height:       Physical Exam Constitutional:      General: He is not in acute distress.    Appearance: He is normal weight.     Comments: Patient sleeping  HENT:     Head: Normocephalic.     Mouth/Throat:     Mouth: Mucous membranes are moist.  Eyes:     Extraocular Movements: Extraocular movements intact.     Pupils: Pupils are equal, round, and reactive to light.  Cardiovascular:     Rate and Rhythm: Normal rate and regular rhythm.  Pulmonary:     Effort: Pulmonary effort is normal.     Comments: Coarse Rales heard predominantly on the left side Abdominal:     General: Bowel sounds are normal.  Musculoskeletal:        General: Normal range of motion.  Skin:    General: Skin is warm.  Neurological:     General: No focal deficit present.   Psychiatric:        Mood and Affect: Mood normal.     Data Reviewed:  There are no new results to review at this time. DG Chest Port 1 View CLINICAL DATA:  Hypoxia  EXAM: PORTABLE CHEST 1 VIEW  COMPARISON:  02/19/2020  FINDINGS: Severe airspace disease on the left. Mild cardiomegaly accentuated by technique. No effusion or pneumothorax.  IMPRESSION: Severe pneumonia on the left.  Electronically Signed   By: Jorje Guild M.D.   On: 11/12/2022 07:07   Lab Results  Component Value Date   WBC 6.0 11/12/2022   HGB 13.5 11/12/2022   HCT 42.0 11/12/2022   MCV 99.5 11/12/2022   PLT 302 [id number]   Last metabolic panel Lab Results  Component Value Date   GLUCOSE 120 (H) 11/12/2022   NA 139 11/12/2022   K 3.7 11/12/2022   CL 104 11/12/2022   CO2 24 11/12/2022   BUN 41 (H) 11/12/2022   CREATININE 1.88 (H) 11/12/2022   GFRNONAA 34 (L) 11/12/2022   CALCIUM 9.2 11/12/2022   PHOS 3.8 04/16/2020   PROT 6.7 11/12/2022   ALBUMIN 3.9 11/12/2022   BILITOT 0.7 11/12/2022   ALKPHOS 58 11/12/2022   AST 39 11/12/2022   ALT 21 11/12/2022   ANIONGAP 11 11/12/2022    Assessment and Plan: Aspiration pneumonia ([place]) New onset hypoxia with noted white left lung with recent major vomiting event early in the morning High suspicion for aspiration pneumonia COVID, flu, RSV negative Satting in the upper 90s with O2 requirements vacillating between 7 L high flow nasal cannula and nonrebreather Patient resting at the bedside with mild improvement in baseline respiratory status White count and procalcitonin within normal limits in the ER. Started on IV Rocephin azithromycin and Flagyl in the ER Will continue w/ rocephin and azithromycin  De-escalate as appropriate Continue supplemental oxygen Blood cultures obtained in ER Follow  Acute respiratory failure with hypoxia ([place]) New onset hypoxia w/ concern  for aspiration event  Satting in the upper 90s with O2 requirements  vacillating between 7 L high flow nasal cannula and nonrebreather  WBC 6, BNP mildly elevated in 200s without volume overload clinically.  CXR w/ severe L sided PNA S/p rocephin, flagyl and azithromycin in the ER  IV rocephin and azithromycin for infectious coverage in the interim  Narrow treatment as clinically appropriate Supplemental O2  Prn duonebs  Follow    Lewy body dementia ([place]) Per the wife, worsening confusion over the past few days in the setting of aspiration event Currently sleeping Follow for now  Coronary artery disease involving native coronary artery of native heart without angina pectoris No reported chest pain Troponin negative x 1 EKG stable Follow Continue home regimen pending swallow eval   PSVT (paroxysmal supraventricular tachycardia) (HCC) Heart rate stable in 80s Continue home amiodarone and Cardizem pending bedside swallow eval  Prn IV dilt may be needed for an acute episode of SVT while pt NPO as BP tolerates  Follow  CKD (chronic kidney disease) stage 3, GFR 30-59 ml/min (HCC) Baseline creatinine 1.4-1.7 Creatinine 1.88 today Borderline decompensation Will hydrate Follow Hold nephrotoxic agents  Essential hypertension BP stable Titrate home regimen      Advance Care Planning:   Code Status: Full Code   Consults: None  Family Communication: Wife Ella at the bedside. Plan of care discussed   Severity of Illness: The appropriate patient status for this patient is INPATIENT. Inpatient status is judged to be reasonable and necessary in order to provide the required intensity of service to ensure the patient's safety. The patient's presenting symptoms, physical exam findings, and initial radiographic and laboratory data in the context of their chronic comorbidities is felt to place them at high risk for further clinical deterioration. Furthermore, it is not anticipated that the patient will be medically stable for discharge from the hospital  within 2 midnights of admission.   * I certify that at the point of admission it is my clinical judgment that the patient will require inpatient hospital care spanning beyond 2 midnights from the point of admission due to high intensity of service, high risk for further deterioration and high frequency of surveillance required.*  Author: Deneise Lever, MD 11/12/2022 9:19 AM  For on call review [email or web address].

## 2022-11-13 DIAGNOSIS — J9601 Acute respiratory failure with hypoxia: Secondary | ICD-10-CM

## 2022-11-13 DIAGNOSIS — J690 Pneumonitis due to inhalation of food and vomit: Secondary | ICD-10-CM | POA: Diagnosis not present

## 2022-11-13 LAB — COMPREHENSIVE METABOLIC PANEL
ALT: 17 U/L (ref 0–44)
AST: 30 U/L (ref 15–41)
Albumin: 3.1 g/dL — ABNORMAL LOW (ref 3.5–5.0)
Alkaline Phosphatase: 33 U/L — ABNORMAL LOW (ref 38–126)
Anion gap: 7 (ref 5–15)
BUN: 36 mg/dL — ABNORMAL HIGH (ref 8–23)
CO2: 27 mmol/L (ref 22–32)
Calcium: 8.6 mg/dL — ABNORMAL LOW (ref 8.9–10.3)
Chloride: 109 mmol/L (ref 98–111)
Creatinine, Ser: 1.55 mg/dL — ABNORMAL HIGH (ref 0.61–1.24)
GFR, Estimated: 43 mL/min — ABNORMAL LOW (ref >60)
Glucose, Bld: 123 mg/dL — ABNORMAL HIGH (ref 70–99)
Potassium: 4 mmol/L (ref 3.5–5.1)
Sodium: 143 mmol/L (ref 135–145)
Total Bilirubin: 0.8 mg/dL (ref 0.3–1.2)
Total Protein: 5.6 g/dL — ABNORMAL LOW (ref 6.5–8.1)

## 2022-11-13 LAB — CBC
HCT: 32.4 % — ABNORMAL LOW (ref 39.0–52.0)
Hemoglobin: 10.6 g/dL — ABNORMAL LOW (ref 13.0–17.0)
MCH: 32.2 pg (ref 26.0–34.0)
MCHC: 32.7 g/dL (ref 30.0–36.0)
MCV: 98.5 fL (ref 80.0–100.0)
Platelets: 188 10*3/uL (ref 150–400)
RBC: 3.29 MIL/uL — ABNORMAL LOW (ref 4.22–5.81)
RDW: 14.7 % (ref 11.5–15.5)
WBC: 9.4 10*3/uL (ref 4.0–10.5)
nRBC: 0 % (ref 0.0–0.2)

## 2022-11-13 LAB — PHOSPHORUS: Phosphorus: 3.9 mg/dL (ref 2.5–4.6)

## 2022-11-13 MED ORDER — LOSARTAN POTASSIUM 50 MG PO TABS
50.0000 mg | ORAL_TABLET | Freq: Two times a day (BID) | ORAL | Status: DC
  Administered 2022-11-13 – 2022-11-17 (×9): 50 mg via ORAL
  Filled 2022-11-13 (×9): qty 1

## 2022-11-13 MED ORDER — AMIODARONE HCL 200 MG PO TABS
100.0000 mg | ORAL_TABLET | Freq: Every day | ORAL | Status: DC
  Administered 2022-11-13 – 2022-11-17 (×5): 100 mg via ORAL
  Filled 2022-11-13 (×5): qty 1

## 2022-11-13 MED ORDER — GUAIFENESIN ER 600 MG PO TB12
600.0000 mg | ORAL_TABLET | Freq: Two times a day (BID) | ORAL | Status: DC
  Administered 2022-11-13 – 2022-11-17 (×9): 600 mg via ORAL
  Filled 2022-11-13 (×9): qty 1

## 2022-11-13 MED ORDER — PRAVASTATIN SODIUM 20 MG PO TABS
20.0000 mg | ORAL_TABLET | Freq: Every day | ORAL | Status: DC
  Administered 2022-11-13 – 2022-11-16 (×4): 20 mg via ORAL
  Filled 2022-11-13 (×4): qty 1

## 2022-11-13 MED ORDER — DONEPEZIL HCL 5 MG PO TABS
5.0000 mg | ORAL_TABLET | Freq: Every day | ORAL | Status: DC
  Administered 2022-11-13 – 2022-11-16 (×4): 5 mg via ORAL
  Filled 2022-11-13 (×4): qty 1

## 2022-11-13 MED ORDER — GABAPENTIN 300 MG PO CAPS
600.0000 mg | ORAL_CAPSULE | Freq: Every day | ORAL | Status: DC
  Administered 2022-11-13 – 2022-11-16 (×4): 600 mg via ORAL
  Filled 2022-11-13 (×4): qty 2

## 2022-11-13 MED ORDER — HYDROCOD POLI-CHLORPHE POLI ER 10-8 MG/5ML PO SUER: 5.0000 mL | Freq: Two times a day (BID) | ORAL | Status: DC | PRN

## 2022-11-13 MED ORDER — ACETAMINOPHEN 325 MG PO TABS
650.0000 mg | ORAL_TABLET | Freq: Four times a day (QID) | ORAL | Status: DC | PRN
  Administered 2022-11-13: 650 mg via ORAL
  Filled 2022-11-13: qty 2

## 2022-11-13 MED ORDER — PANTOPRAZOLE SODIUM 40 MG PO TBEC
40.0000 mg | DELAYED_RELEASE_TABLET | Freq: Every day | ORAL | Status: DC
  Administered 2022-11-13 – 2022-11-17 (×5): 40 mg via ORAL
  Filled 2022-11-13 (×5): qty 1

## 2022-11-13 MED ORDER — DILTIAZEM HCL ER COATED BEADS 120 MG PO CP24
120.0000 mg | ORAL_CAPSULE | Freq: Every day | ORAL | Status: DC
  Administered 2022-11-13 – 2022-11-17 (×5): 120 mg via ORAL
  Filled 2022-11-13 (×6): qty 1

## 2022-11-13 MED ORDER — CALCIUM CARBONATE ANTACID 500 MG PO CHEW
1.0000 | CHEWABLE_TABLET | Freq: Every day | ORAL | Status: DC
  Administered 2022-11-15 – 2022-11-17 (×3): 200 mg via ORAL
  Filled 2022-11-13 (×6): qty 1

## 2022-11-13 MED ORDER — LEVOTHYROXINE SODIUM 50 MCG PO TABS
50.0000 ug | ORAL_TABLET | Freq: Every day | ORAL | Status: DC
  Administered 2022-11-13 – 2022-11-17 (×5): 50 ug via ORAL
  Filled 2022-11-13 (×5): qty 1

## 2022-11-13 MED ORDER — OXYCODONE-ACETAMINOPHEN 5-325 MG PO TABS
2.0000 | ORAL_TABLET | Freq: Three times a day (TID) | ORAL | Status: DC | PRN
  Administered 2022-11-13 – 2022-11-16 (×4): 2 via ORAL
  Filled 2022-11-13 (×4): qty 2

## 2022-11-13 NOTE — Progress Notes (Signed)
[facility] Progress Note  Patient: Michael [name]    [id number]  DOA: 11/12/2022     Date of Service: the patient was seen and examined on 11/13/2022  Chief Complaint  Patient presents with   Respiratory Distress   Shortness of Breath   Brief hospital course: Michael [name] is a 89 y.o. male with medical history significant of multiple medical issues including coronary artery disease, stage III CKD, diastolic dysfunction, history of SVT, GERD, Lewy body dementia presenting with aspiration pneumonia.    History primarily from patient's wife Lacy Countess.  Per the wife, patient woke up to use the bathroom around 3 AM with significant weakness.  Patient has significant episodes of vomiting.  Because of the vomiting and weakness, the wife called EMS with patient having another episode of vomiting on EMS arrival.  Per the wife, patient has had overall worsening lethargy and confusion over the past 2 to 3 days.  Positive mild chills.  ED w/up: hemodynamically stable. Requiring 15 L nonrebreather to keep O2 sats greater than 95% initially. ABG grossly stable. Creatinine 1.9. White count 6, hemoglobin 13.5. Respiratory panel of negative for COVID flu and RSV. Troponin negative x 2. Chest x-ray with severe pneumonia on the left side. Started on Rocephin, azithromycin and Flagyl in the ER.    Assessment and Plan:  # Acute respiratory failure with hypoxia due to Aspiration  Pt required 7 L with high flow nasal cannula and nonrebreather on arrival, currently patient is using 5 to oxygen via nasal cannula. WBC 6, BNP mildly elevated in 200s without volume overload clinically.  CXR w/ severe L sided PNA S/p rocephin, flagyl and azithromycin in the ER  Continue ceftriaxone 2 g IV daily, azithromycin 500 mg IV daily and Flagyl 500 mg IV every 12 hourly. Continue supplemental O2 inhalation and gradually wean off Started Mucinex 600 twice daily, Tussionex as needed for cough Prn duonebs   # Lewy body  dementia ([place]) Per the wife, worsening confusion over the past few days in the setting of aspiration event Resumed Aricept Continue supportive care   # Coronary artery disease involving native coronary artery of native heart without angina pectoris No reported chest pain Troponin negative x 1 EKG stable   # PSVT (paroxysmal supraventricular tachycardia) Heart rate stable in 80s Continue home amiodarone and Cardizem      # CKD (chronic kidney disease) stage 3, GFR 30-59 ml/min (HCC) Baseline creatinine 1.4-1.7 Creatinine 1.88 --1.55  Borderline decompensation Continue IV fluid for gentle hydration    # Essential hypertension Resumed losartan and Cardizem Home medications Monitor BP and titrate medications accordingly   Peripheral neuropathy, continued gabapentin Chronic pain, continued Percocet as needed home dose  Body mass index is 28.49 kg/m.  Interventions:     Diet: Regular diet DVT Prophylaxis: Subcutaneous Lovenox   Advance goals of care discussion: Full code  Family Communication: family was not present at bedside, at the time of interview.  The pt provided permission to discuss medical plan with the family. Opportunity was given to ask question and all questions were answered satisfactorily.   Disposition:  Pt is from Home, admitted with Resp Failure LLL aspiration PNA, still has Resp Failure, which precludes a safe discharge. Discharge to Home, when clinically stable, may need few days to improve..  Subjective: No significant events overnight, patient still in respiratory failure, patient has significant dementia and hard of hearing.  Patient was explained that he needs to stay few days in the  hospital, patient was asking for home medications and he was hungry wanted to eat. Denies any chest pain or palpitation, no any other active issues.  Physical Exam: General: NAD, lying comfortably Appear in no distress, affect appropriate Eyes: PERRLA ENT: Oral  Mucosa Clear, moist. Hard of Hearing using Hearing Aids b/l  Neck: no JVD,  Cardiovascular: S1 and S2 Present, no Murmur,  Respiratory: Mild shortness of breath, mild bibasilar crackles greater on the left lower side, no wheezing.   Abdomen: Bowel Sound present, Soft and no tenderness,  Skin: no rashes Extremities: no Pedal edema, no calf tenderness Neurologic: without any new focal findings Gait not checked due to patient safety concerns  Vitals:   11/12/22 2153 11/13/22 0620 11/13/22 0908 11/13/22 1157  BP: 125/67 (!) 155/68 (!) 146/96   Pulse: 69 75 73 87  Resp: '18 18 18 16  '$ Temp: 98.1 F (36.7 C) 98 F (36.7 C) 98 F (36.7 C)   TempSrc:      SpO2: 100% 99% 100% 96%  Weight:      Height:        Intake/Output Summary (Last 24 hours) at 11/13/2022 1233 Last data filed at 11/13/2022 1015 Gross per 24 hour  Intake 1677.13 ml  Output 100 ml  Net 1577.13 ml   Filed Weights   11/12/22 0644 11/12/22 1035  Weight: 89.8 kg 87.5 kg    Data Reviewed: I have personally reviewed and interpreted daily labs, tele strips, imagings as discussed above. I reviewed all nursing notes, pharmacy notes, vitals, pertinent old records I have discussed plan of care as described above with RN and patient/family.  CBC: Recent Labs  Lab 11/12/22 0647 11/13/22 0446  WBC 6.0 9.4  NEUTROABS 4.6  --   HGB 13.5 10.6*  HCT 42.0 32.4*  MCV 99.5 98.5  PLT 302 0000000   Basic Metabolic Panel: Recent Labs  Lab 11/12/22 0647 11/13/22 0443 11/13/22 0446  NA 139  --  143  K 3.7  --  4.0  CL 104  --  109  CO2 24  --  27  GLUCOSE 120*  --  123*  BUN 41*  --  36*  CREATININE 1.88*  --  1.55*  CALCIUM 9.2  --  8.6*  MG 2.4  --   --   PHOS  --  3.9  --     Studies: No results found.  Scheduled Meds:  amiodarone  100 mg Oral Daily   diltiazem  120 mg Oral Daily   donepezil  5 mg Oral QHS   enoxaparin (LOVENOX) injection  40 mg Subcutaneous Q24H   gabapentin  600 mg Oral QHS   guaiFENesin   600 mg Oral BID   levothyroxine  50 mcg Oral Q0600   losartan  50 mg Oral BID   pantoprazole  40 mg Oral Daily   pravastatin  20 mg Oral q1800   Continuous Infusions:  sodium chloride 75 mL/hr at 11/13/22 0555   azithromycin Stopped (11/13/22 1015)   cefTRIAXone (ROCEPHIN)  IV Stopped (11/13/22 [id number])   metronidazole 500 mg (11/13/22 1059)   PRN Meds: acetaminophen, chlorpheniramine-HYDROcodone, ipratropium-albuterol, ondansetron **OR** ondansetron (ZOFRAN) IV, oxyCODONE-acetaminophen  Time spent: 35 minutes  Author: Val Riles. MD [facility] 11/13/2022 12:33 PM  To reach On-call, see care teams to locate the attending and reach out to them via [email or web address]. If 7PM-7AM, please contact night-coverage If you still have difficulty reaching the attending provider, please page the [facility] (Director on Call) for [facility] on amion for assistance.

## 2022-11-13 NOTE — Evaluation (Signed)
Clinical/Bedside Swallow Evaluation Patient Details  Name: Michael [name] MRN: [id number] Date of Birth: 01/28/1932  Today's Date: 11/13/2022 Time: SLP Start Time (ACUTE ONLY): 1045 SLP Stop Time (ACUTE ONLY): 1130 SLP Time Calculation (min) (ACUTE ONLY): 45 min  Past Medical History:  Past Medical History:  Diagnosis Date   Anemia    Aortic atherosclerosis (HCC)    Arthritis    Aspiration pneumonia ([place]) 2020   BPH (benign prostatic hypertrophy)    CAD (coronary artery disease) 05/23/2016   a.) CT 05/23/2016: extensive 3v CAD.   Cellulitis of face 12/21/2019   Chronic back pain    CKD (chronic kidney disease), stage III (HCC)    Colon cancer ([place]) 1988   Surgery alone, no history of chemo   Diastolic dysfunction [id number]   a.) TTE 05/23/2014; EF 63%; G1DD. b.) TTE 10/27/2014: EF 61%, mild LA dilation; G2DD. c.) TTE 04/10/2020: EF >55%. GLS -20.3%. Triv panvalvular regurgitation; no stenosis.   Diverticulosis    Essential hypertension    First degree AV block    GERD (gastroesophageal reflux disease)    H/O calcium pyrophosphate deposition disease (CPPD)    Hiatal hernia 01/06/2015   History of kidney stones    HLD (hyperlipidemia)    HOH (hard of hearing)    bilateral hearing aids   Hypokalemia    Impotence of organic origin    Incisional hernia 10/2021   Lewy body dementia ([place])    Peritonsillar abscess determined by examination 05/15/2019   S/p abx, I&D by ENT 05/2019   PSVT (paroxysmal supraventricular tachycardia)    a. 04/2014 Event Monitor: Freq bouts of SVT with rates to 180-->seen by EP with recommendation for RFCA, pt deferred;  b. 05/2014 Echo: EF nl, mild conc LVH, no rwma, Gr1 DD, mildMR/TR.   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 Georgette Dover, MD   ROTATOR CUFF REPAIR Right 2000    [facility]   TOTAL HIP ARTHROPLASTY Right 03/24/2020   Procedure: TOTAL HIP ARTHROPLASTY ANTERIOR APPROACH;  Surgeon: Paralee Cancel, MD;  Location: [facility];  Service: Orthopedics;  Laterality: Right;  70 mins   TOTAL KNEE ARTHROPLASTY  03/05/2012   Procedure: TOTAL KNEE ARTHROPLASTY;  Surgeon: Gearlean Alf, MD;  Location: [facility];  Service: Orthopedics;  Laterality: Left;   [facility], x 4   VEIN LIGATION AND STRIPPING Right    XI ROBOTIC ASSISTED VENTRAL HERNIA N/A 11/01/2021   Procedure: XI ROBOTIC ASSISTED VENTRAL HERNIA;  Surgeon: Herbert Pun, MD;  Location: [facility] ORS;  Service: General;  Laterality: N/A;   HPI:  Pt is a 89 y.o. male with medical history significant of Multiple medical issues including Lewy-Body Dementia, GERD, HIatal Hernia, coronary artery disease, stage III CKD, diastolic dysfunction, history of SVT, presenting with Vomiting episodes and possible aspiration pneumonia.  History primarily from  patient's wife Michael [name].  Per the wife, patient woke up to use the bathroom around 3 AM with significant weakness.  Patient has significant episodes of Vomiting.  Because of the Vomiting and weakness, the wife called EMS with patient having another episode of Vomiting on EMS arrival.  Per the wife, patient has had overall worsening lethargy and confusion over the past 2 to 3 days.  Positive mild chills.  No chest pain or belly pain.  No reported new medication changes.  No reports of falls or head trauma.  Presented to the ER afebrile,  hemodynamically stable.  Requiring 15 L nonrebreather to keep O2 sats greater than 95% initially.  White count 6, hemoglobin 13.5.  Respiratory panel of negative for COVID flu and RSV.  CXR at admit: pneumonia on the left.  OF NOTE: in the ED, pt was "placed on bipap. On arrival, patient had large emesis episodes with bipap mask on. Converted to NRB and tolerating well.".    Assessment / Plan / Recommendation  Clinical Impression    Pt seen for BSE this morning. Pt awake, verbally conversive and alert/oriented x3, in general. He was able to recall having N/V at home but seemed to perseverate on the fact that he has not had his "medications" today. He described using a walker at home and "medications" for his pain and increased movements. Pt is HOH w/ bilat. HAs+. He endorsed needing min extra time to "process" information. NSG aware.  On [place] O2 support- 2L; afebrile, WBC Not elevated.    OF NOTE: Pt has Baseline dxs of GERD and Hiatal Hernia, [name] of which can impact Esophageal phase motility. REFLUX precautions are strongly recommended.   Pt appears to present w/ functional oropharyngeal phase swallowing w/ No overt oropharyngeal phase dysphagia appreciated during oral intake of trials; No neuromuscular swallowing deficits appreciated. No overt clinical s/s of aspiration noted during oral intake of trials. Pt appears at reduced risk for aspiration from an oropharyngeal phase standpoint following general aspiration precautions.  HOWEVER, pt has a baseline presentation of REFLUX; Baseline h/o GERD and Hiatal Hernia w/ recent episodes of Vomiting prior to this admit. He is not on a PPI currently. ANY Dysmotility or Regurgitation of REFLUX material can increase risk for aspiration of the REFLUX material during Retrograde flow thus impact Voicing and Pulmonary status. Pt described issues of burping at times and that "heavy [place] foods can cause an upset stomach if I am not careful when I eat them".     Pt  sat upright in bed and consumed several trials of thin liquids Via Cup/Straw, purees, and solid foods w/ No overt clinical s/s of aspiration noted; clear vocal quality b/t trials, no coughing, no decline in pulmonary status, no multiple swallows noted post initial pharyngeal swallow, no decline in O2 sats(98%). Oral phase appeared [facility] for bolus management and timely A-P transfer/clearing of material. Mastication appropriate for boluses. OM exam was [facility] for oral clearing; lingual/labial movements. No unilateral weakness. Speech clear.    Recommend a Regular diet (cut, moistened foods) w/ thin liquids. General aspiration precautions. Rest Breaks during meals/oral intake to allow for Esophageal clearing. REFLUX precautions strongly recommended to lessen chance for Regurgitation -- HOB elevated at night when sleeping and do NOT lie down after meals for ~1 hour. Pills 1 at a time w/ water; or in a Puree if needed for ease of swallowing. Consulted MD re: GERD dx and potential PPI for pt.    Recommend pt f/u w/  GI for assessment/management of GERD and tx as indicated. Discussion given on REFLUX, behaviors to manage REFLUX, and foods/diet; also genera aspiration precautions. MD to reconsult ST services if any new needs while admitted. NSG updated. Pt appreciative of Education information. SLP Visit Diagnosis: Dysphagia, unspecified (R13.10) (GERD, hiatal hernia baseline)    Aspiration Risk   (reduced following general precs in setting of LB Dementia, weakness)    Diet Recommendation    a Regular diet (cut, moistened foods) w/ thin liquids. General aspiration precautions. Rest Breaks during meals/oral intake to allow for Esophageal clearing. REFLUX precautions strongly recommended to lessen chance for Regurgitation -- HOB elevated when sleeping and do NOT lie down after meals for ~1 hour.  Medication Administration: Whole meds with puree (IF any trouble swallowing w/ liquids(one at a time))    Other   Recommendations Recommended Consults: Consider GI evaluation (Dietician f/u) Oral Care Recommendations: Oral care BID;Oral care before and after PO;Patient independent with oral care (setup)    Recommendations for follow up therapy are one component of a multi-disciplinary discharge planning process, led by the attending physician.  Recommendations may be updated based on patient status, additional functional criteria and insurance authorization.  Follow up Recommendations No SLP follow up      Assistance Recommended at Discharge  PRN  Functional Status Assessment Patient has had a recent decline in their functional status and demonstrates the ability to make significant improvements in function in a reasonable and predictable amount of time.  Frequency and Duration  (n/a)   (n/a)       Prognosis Prognosis for improved oropharyngeal function: Good Barriers to Reach Goals: Cognitive deficits;Time post onset;Severity of deficits (GERD, hiatal hernia baseline) Barriers/Prognosis Comment: baseline LB Dementia, weakness      Swallow Study   General Date of Onset: 11/12/22 HPI: Pt is a 89 y.o. male with medical history significant of Multiple medical issues including Lewy-Body Dementia, GERD, HIatal Hernia, coronary artery disease, stage III CKD, diastolic dysfunction, history of SVT, presenting with Vomiting episodes and possible aspiration pneumonia.  History primarily from patient's wife Michael [name].  Per the wife, patient woke up to use the bathroom around 3 AM with significant weakness.  Patient has significant episodes of Vomiting.  Because of the Vomiting and weakness, the wife called EMS with patient having another episode of Vomiting on EMS arrival.  Per the wife, patient has had overall worsening lethargy and confusion over the past 2 to 3 days.  Positive mild chills.  No chest pain or belly pain.  No reported new medication changes.  No reports of falls or head trauma.  Presented to the  ER afebrile, hemodynamically stable.  Requiring 15 L nonrebreather to keep O2 sats greater than 95% initially.  White count 6, hemoglobin 13.5.  Respiratory panel of negative for COVID flu and RSV.  CXR at admit: pneumonia on the left.  OF NOTE: in the ED, pt was "placed on bipap. On arrival, patient had large emesis episodes with bipap mask on. Converted to NRB and tolerating well.". Type of Study: Bedside Swallow Evaluation Previous Swallow Assessment: 2016 - regular diet then Diet Prior to this Study: NPO (regular diet at home) Temperature Spikes Noted: No (wbc 9.4) Respiratory Status: Nasal cannula (2L) History of Recent Intubation: No Behavior/Cognition: Alert;Cooperative;Pleasant mood Oral Cavity Assessment: Within Functional Limits Oral Care Completed by SLP: Yes (by pt w/ setup) Oral Cavity - Dentition: Adequate natural dentition Vision: Functional for self-feeding Self-Feeding Abilities: Able to feed self;Needs set  up Patient Positioning: Upright in bed (positioning support given) Baseline Vocal Quality: Normal Volitional Cough: Strong Volitional Swallow: Able to elicit    Oral/Motor/Sensory Function Overall Oral Motor/Sensory Function: Within functional limits   Ice Chips Ice chips: Within functional limits Presentation: Spoon (fed; 2 trials)   Thin Liquid Thin Liquid: Within functional limits Presentation: Cup;Self Fed;Straw (~10 ozs total) Other Comments: water, juice    Nectar Thick Nectar Thick Liquid: Not tested   Honey Thick Honey Thick Liquid: Not tested   Puree Puree: Within functional limits Presentation: Self Fed;Spoon (8 ozs)   Solid     Solid: Within functional limits Presentation: Self Fed (a full package of graham crackers, 10+ trials)        Orinda Kenner, MS, CCC-SLP Speech Language Pathologist Rehab Services; [place] [phone] (ascom) Marialice Newkirk 11/13/2022,12:08 PM

## 2022-11-14 DIAGNOSIS — J9601 Acute respiratory failure with hypoxia: Secondary | ICD-10-CM | POA: Diagnosis not present

## 2022-11-14 DIAGNOSIS — J690 Pneumonitis due to inhalation of food and vomit: Secondary | ICD-10-CM | POA: Diagnosis not present

## 2022-11-14 LAB — MAGNESIUM: Magnesium: 2.1 mg/dL (ref 1.7–2.4)

## 2022-11-14 LAB — CBC
HCT: 29.3 % — ABNORMAL LOW (ref 39.0–52.0)
Hemoglobin: 9.5 g/dL — ABNORMAL LOW (ref 13.0–17.0)
MCH: 32 pg (ref 26.0–34.0)
MCHC: 32.4 g/dL (ref 30.0–36.0)
MCV: 98.7 fL (ref 80.0–100.0)
Platelets: 181 10*3/uL (ref 150–400)
RBC: 2.97 MIL/uL — ABNORMAL LOW (ref 4.22–5.81)
RDW: 15 % (ref 11.5–15.5)
WBC: 8.6 10*3/uL (ref 4.0–10.5)
nRBC: 0 % (ref 0.0–0.2)

## 2022-11-14 LAB — BASIC METABOLIC PANEL
Anion gap: 9 (ref 5–15)
BUN: 42 mg/dL — ABNORMAL HIGH (ref 8–23)
CO2: 23 mmol/L (ref 22–32)
Calcium: 8.2 mg/dL — ABNORMAL LOW (ref 8.9–10.3)
Chloride: 108 mmol/L (ref 98–111)
Creatinine, Ser: 1.49 mg/dL — ABNORMAL HIGH (ref 0.61–1.24)
GFR, Estimated: 45 mL/min — ABNORMAL LOW (ref >60)
Glucose, Bld: 91 mg/dL (ref 70–99)
Potassium: 3.5 mmol/L (ref 3.5–5.1)
Sodium: 140 mmol/L (ref 135–145)

## 2022-11-14 LAB — PHOSPHORUS: Phosphorus: 3.1 mg/dL (ref 2.5–4.6)

## 2022-11-14 MED ORDER — DIPHENHYDRAMINE HCL 50 MG/ML IJ SOLN
25.0000 mg | Freq: Once | INTRAMUSCULAR | Status: AC
  Administered 2022-11-14: 25 mg via INTRAVENOUS
  Filled 2022-11-14: qty 1

## 2022-11-14 MED ORDER — CALCIUM CARBONATE ANTACID 500 MG PO CHEW
1.0000 | CHEWABLE_TABLET | Freq: Once | ORAL | Status: AC
  Administered 2022-11-14: 200 mg via ORAL

## 2022-11-14 MED ORDER — ROPINIROLE HCL 0.25 MG PO TABS
0.2500 mg | ORAL_TABLET | Freq: Three times a day (TID) | ORAL | Status: DC
  Administered 2022-11-14 – 2022-11-17 (×8): 0.25 mg via ORAL
  Filled 2022-11-14 (×11): qty 1

## 2022-11-14 MED ORDER — MELATONIN 5 MG PO TABS
5.0000 mg | ORAL_TABLET | Freq: Every evening | ORAL | Status: DC
  Administered 2022-11-14 – 2022-11-16 (×3): 5 mg via ORAL
  Filled 2022-11-14 (×4): qty 1

## 2022-11-14 NOTE — Progress Notes (Signed)
[facility] Progress Note  Patient: DEMARI [name]    [id number]  DOA: 11/12/2022     Date of Service: the patient was seen and examined on 11/14/2022  Chief Complaint  Patient presents with   Respiratory Distress   Shortness of Breath   Brief hospital course: Michael [name] is a 89 y.o. male with medical history significant of multiple medical issues including coronary artery disease, stage III CKD, diastolic dysfunction, history of SVT, GERD, Lewy body dementia presenting with aspiration pneumonia.    History primarily from patient's wife Michael [name].  Per the wife, patient woke up to use the bathroom around 3 AM with significant weakness.  Patient has significant episodes of vomiting.  Because of the vomiting and weakness, the wife called EMS with patient having another episode of vomiting on EMS arrival.  Per the wife, patient has had overall worsening lethargy and confusion over the past 2 to 3 days.  Positive mild chills.  ED w/up: hemodynamically stable. Requiring 15 L nonrebreather to keep O2 sats greater than 95% initially. ABG grossly stable. Creatinine 1.9. White count 6, hemoglobin 13.5. Respiratory panel of negative for COVID flu and RSV. Troponin negative x 2. Chest x-ray with severe pneumonia on the left side. Started on Rocephin, azithromycin and Flagyl in the ER.    Assessment and Plan:  # Acute respiratory failure with hypoxia due to Aspiration  Pt required 7 L with high flow nasal cannula and nonrebreather on arrival, currently patient is using 2 to oxygen via nasal cannula. WBC 6, BNP mildly elevated in 200s without volume overload clinically.  CXR w/ severe L sided PNA S/p rocephin, flagyl and azithromycin in the ER  Continue ceftriaxone 2 g IV daily, azithromycin 500 mg IV daily and Flagyl 500 mg IV every 12 hourly. Continue supplemental O2 inhalation and gradually wean off Started Mucinex 600 twice daily, Tussionex as needed for cough Prn duonebs   # Lewy body  dementia ([place]) Per the wife, worsening confusion over the past few days in the setting of aspiration event Resumed Aricept Continue supportive care   # Coronary artery disease involving native coronary artery of native heart without angina pectoris No reported chest pain Troponin negative x 1 EKG stable   # PSVT (paroxysmal supraventricular tachycardia) Heart rate stable in 80s Continue home amiodarone and Cardizem      # CKD (chronic kidney disease) stage 3, GFR 30-59 ml/min (HCC) Baseline creatinine 1.4-1.7 Creatinine 1.88 --1.55  Borderline decompensation Continue IV fluid for gentle hydration    # Essential hypertension Resumed losartan and Cardizem Home medications Monitor BP and titrate medications accordingly   Peripheral neuropathy, continued gabapentin Chronic pain, continued Percocet as needed home dose Insomnia, started melatonin 5 mg nightly Restless leg syndrome, patient is complaining of excessive leg movements.  Started Requip 0.25 mg p.o. 3 times daily   Body mass index is 28.49 kg/m.  Interventions:     Diet: Regular diet DVT Prophylaxis: Subcutaneous Lovenox   Advance goals of care discussion: Full code  Family Communication: family was not present at bedside, at the time of interview.  The pt provided permission to discuss medical plan with the family. Opportunity was given to ask question and all questions were answered satisfactorily.   Disposition:  Pt is from Home, admitted with Resp Failure LLL aspiration PNA, still has Resp Failure, which precludes a safe discharge. Discharge to Home, when clinically stable, may need few days to improve..  Subjective: No significant events overnight,  patient has hard of hearing, stated that he is feeling better now with respiratory status, patient could not sleep last night due to insomnia and excessive leg movements.  Denies any other complaints.   Physical Exam: General: NAD, lying comfortably Appear  in no distress, affect appropriate Eyes: PERRLA ENT: Oral Mucosa Clear, moist. Hard of Hearing using Hearing Aids b/l  Neck: no JVD,  Cardiovascular: S1 and S2 Present, no Murmur,  Respiratory: Mild shortness of breath, mild bibasilar crackles greater on the left lower side, no wheezing.   Abdomen: Bowel Sound present, Soft and no tenderness,  Skin: no rashes Extremities: no Pedal edema, no calf tenderness Neurologic: without any new focal findings Gait not checked due to patient safety concerns  Vitals:   11/13/22 1157 11/13/22 1648 11/13/22 2109 11/14/22 0946  BP:  135/73 (!) 152/82 (!) 151/72  Pulse: 87 70 73 71  Resp: '16 18 16   '$ Temp:   98.5 F (36.9 C)   TempSrc:   Oral   SpO2: 96% 99% 95% 95%  Weight:      Height:        Intake/Output Summary (Last 24 hours) at 11/14/2022 1123 Last data filed at 11/14/2022 0900 Gross per 24 hour  Intake 598.48 ml  Output 575 ml  Net 23.48 ml   Filed Weights   11/12/22 0644 11/12/22 1035  Weight: 89.8 kg 87.5 kg    Data Reviewed: I have personally reviewed and interpreted daily labs, tele strips, imagings as discussed above. I reviewed all nursing notes, pharmacy notes, vitals, pertinent old records I have discussed plan of care as described above with RN and patient/family.  CBC: Recent Labs  Lab 11/12/22 0647 11/13/22 0446 11/14/22 0425  WBC 6.0 9.4 8.6  NEUTROABS 4.6  --   --   HGB 13.5 10.6* 9.5*  HCT 42.0 32.4* 29.3*  MCV 99.5 98.5 98.7  PLT 302 188 0000000   Basic Metabolic Panel: Recent Labs  Lab 11/12/22 0647 11/13/22 0443 11/13/22 0446 11/14/22 0425  NA 139  --  143 140  K 3.7  --  4.0 3.5  CL 104  --  109 108  CO2 24  --  27 23  GLUCOSE 120*  --  123* 91  BUN 41*  --  36* 42*  CREATININE 1.88*  --  1.55* 1.49*  CALCIUM 9.2  --  8.6* 8.2*  MG 2.4  --   --  2.1  PHOS  --  3.9  --  3.1    Studies: No results found.  Scheduled Meds:  amiodarone  100 mg Oral Daily   calcium carbonate  1 tablet Oral Daily    diltiazem  120 mg Oral Daily   donepezil  5 mg Oral QHS   enoxaparin (LOVENOX) injection  40 mg Subcutaneous Q24H   gabapentin  600 mg Oral QHS   guaiFENesin  600 mg Oral BID   levothyroxine  50 mcg Oral Q0600   losartan  50 mg Oral BID   pantoprazole  40 mg Oral Daily   pravastatin  20 mg Oral q1800   Continuous Infusions:  sodium chloride 75 mL/hr at 11/14/22 0622   azithromycin 500 mg (11/14/22 1004)   cefTRIAXone (ROCEPHIN)  IV 2 g (11/14/22 0839)   metronidazole Stopped (11/14/22 0619)   PRN Meds: acetaminophen, chlorpheniramine-HYDROcodone, ipratropium-albuterol, ondansetron **OR** ondansetron (ZOFRAN) IV, oxyCODONE-acetaminophen  Time spent: 35 minutes  Author: Val Riles. MD [facility] 11/14/2022 11:23 AM  To reach On-call, see care  teams to locate the attending and reach out to them via [email or web address]. If 7PM-7AM, please contact night-coverage If you still have difficulty reaching the attending provider, please page the [facility] (Director on Call) for [facility] on amion for assistance.

## 2022-11-14 NOTE — Progress Notes (Signed)
       CROSS COVER NOTE  NAME: Michael [name] MRN: [id number] DOB : 1932/06/21 ATTENDING PHYSICIAN: Val Riles, MD    Date of Service   11/14/2022   HPI/Events of Note   Medication request received for Ambien as sleep aid as formulary alternative to home Lunesta. Ambien is not currently ordered outpatient for this patient. Qtc on recent EKG 551.  Interventions   Assessment/Plan:  IV Benadryl x1      To reach the provider On-Call:   7AM- 7PM see care teams to locate the attending and reach out to them via [email or web address] Password: [email or web address] 7PM-7AM contact night-coverage If you still have difficulty reaching the appropriate provider, please page the [facility] (Director on Call) for [facility] on amion for assistance  This document was prepared using Systems analyst and may include unintentional dictation errors.  Neomia Glass DNP, MBA, FNP-BC, PMHNP-BC Nurse Practitioner [facility] Pager [phone]

## 2022-11-15 DIAGNOSIS — J9601 Acute respiratory failure with hypoxia: Secondary | ICD-10-CM | POA: Diagnosis not present

## 2022-11-15 LAB — CULTURE, RESPIRATORY W GRAM STAIN: Culture: NORMAL

## 2022-11-15 LAB — CBC
HCT: 33 % — ABNORMAL LOW (ref 39.0–52.0)
Hemoglobin: 10.7 g/dL — ABNORMAL LOW (ref 13.0–17.0)
MCH: 31.8 pg (ref 26.0–34.0)
MCHC: 32.4 g/dL (ref 30.0–36.0)
MCV: 97.9 fL (ref 80.0–100.0)
Platelets: 195 10*3/uL (ref 150–400)
RBC: 3.37 MIL/uL — ABNORMAL LOW (ref 4.22–5.81)
RDW: 14.6 % (ref 11.5–15.5)
WBC: 11.5 10*3/uL — ABNORMAL HIGH (ref 4.0–10.5)
nRBC: 0 % (ref 0.0–0.2)

## 2022-11-15 LAB — BASIC METABOLIC PANEL
Anion gap: 5 (ref 5–15)
BUN: 30 mg/dL — ABNORMAL HIGH (ref 8–23)
CO2: 28 mmol/L (ref 22–32)
Calcium: 8.5 mg/dL — ABNORMAL LOW (ref 8.9–10.3)
Chloride: 104 mmol/L (ref 98–111)
Creatinine, Ser: 1.14 mg/dL (ref 0.61–1.24)
GFR, Estimated: >60 mL/min (ref >60)
Glucose, Bld: 85 mg/dL (ref 70–99)
Potassium: 2.7 mmol/L — CL (ref 3.5–5.1)
Sodium: 137 mmol/L (ref 135–145)

## 2022-11-15 LAB — PHOSPHORUS: Phosphorus: 2.8 mg/dL (ref 2.5–4.6)

## 2022-11-15 LAB — LEGIONELLA PNEUMOPHILA SEROGP 1 UR AG: L. pneumophila Serogp 1 Ur Ag: NEGATIVE

## 2022-11-15 LAB — MAGNESIUM: Magnesium: 2 mg/dL (ref 1.7–2.4)

## 2022-11-15 MED ORDER — HYDRALAZINE HCL 20 MG/ML IJ SOLN
10.0000 mg | Freq: Four times a day (QID) | INTRAMUSCULAR | Status: DC | PRN
  Administered 2022-11-16: 10 mg via INTRAVENOUS
  Filled 2022-11-15: qty 1

## 2022-11-15 MED ORDER — POTASSIUM CHLORIDE 10 MEQ/100ML IV SOLN
10.0000 meq | INTRAVENOUS | Status: AC
  Administered 2022-11-15 (×4): 10 meq via INTRAVENOUS
  Filled 2022-11-15: qty 100

## 2022-11-15 MED ORDER — POTASSIUM CHLORIDE CRYS ER 20 MEQ PO TBCR
40.0000 meq | EXTENDED_RELEASE_TABLET | ORAL | Status: AC
  Administered 2022-11-15 (×2): 40 meq via ORAL
  Filled 2022-11-15: qty 2

## 2022-11-15 MED ORDER — SACCHAROMYCES BOULARDII 250 MG PO CAPS
250.0000 mg | ORAL_CAPSULE | Freq: Two times a day (BID) | ORAL | Status: DC
  Administered 2022-11-15 – 2022-11-17 (×5): 250 mg via ORAL
  Filled 2022-11-15 (×5): qty 1

## 2022-11-15 NOTE — Evaluation (Signed)
Physical Therapy Evaluation Patient Details Name: Michael [name] MRN: [id number] DOB: Jan 28, 1932 Today's Date: 11/15/2022  History of Present Illness  Pt is an 89 y.o. male presenting to hospital 11/12/22 for evaluation of respiratory distress and SOB (hypoxic to 50's on room air) and vomiting.  Pt admitted with aspiration PNA, acute respiratory failure with hypoxia, and Lewy body dementia.  Noted to have diarrhea during hospitalization.  PMH includes Lewy body dementia, htn, CKD, CAD, SVT, HOH, R RCR, L partial mastectomy, R THA, L TKA.  Clinical Impression  Prior to hospital admission, pt was modified independent ambulating (with RW or SPC depending on situation); lives with his wife in 1 level home with 2 STE with R railing.  Currently pt is CGA with transfers and CGA to SBA ambulating 80 feet with RW use.  Pt's O2 sats 92% at rest on room air and 89% on room air with activity.  Pt would currently benefit from skilled PT to address noted impairments and functional limitations (see below for any additional details).  Upon hospital discharge, pt would benefit from ongoing therapy (pt and pt's wife requesting HHPT).    Recommendations for follow up therapy are one component of a multi-disciplinary discharge planning process, led by the attending physician.  Recommendations may be updated based on patient status, additional functional criteria and insurance authorization.  Follow Up Recommendations Home health PT      Assistance Recommended at Discharge Intermittent Supervision/Assistance  Patient can return home with the following  A little help with walking and/or transfers;A little help with bathing/dressing/bathroom;Assistance with cooking/housework;Assist for transportation;Help with stairs or ramp for entrance    Equipment Recommendations Other (comment) (pt has RW at home already)  Recommendations for Other Services       Functional Status Assessment Patient has had a recent decline in  their functional status and demonstrates the ability to make significant improvements in function in a reasonable and predictable amount of time.     Precautions / Restrictions Precautions Precautions: Fall Precaution Comments: Aspiration Restrictions Weight Bearing Restrictions: No      Mobility  Bed Mobility Overal bed mobility: Needs Assistance Bed Mobility: Sit to Supine       Sit to supine: Supervision, HOB elevated        Transfers Overall transfer level: Needs assistance Equipment used: Rolling walker (2 wheels) Transfers: Sit to/from Stand Sit to Stand: Min guard           General transfer comment: mild increased effort to stand up to RW but steady and safe    Ambulation/Gait Ambulation/Gait assistance: Min guard, Supervision Gait Distance (Feet): 80 Feet Assistive device: Rolling walker (2 wheels) Gait Pattern/deviations: Step-through pattern Gait velocity: increased cadence noted at times     General Gait Details: steady ambulation with RW use  Stairs            Wheelchair Mobility    Modified Rankin (Stroke Patients Only)       Balance Overall balance assessment: Needs assistance Sitting-balance support: No upper extremity supported, Feet supported Sitting balance-Leahy Scale: Normal Sitting balance - Comments: steady sitting reaching outside BOS   Standing balance support: Bilateral upper extremity supported, During functional activity, Reliant on assistive device for balance Standing balance-Leahy Scale: Good Standing balance comment: steady ambulating with RW use                             Pertinent Vitals/Pain Pain Assessment Pain Assessment:  Faces Faces Pain Scale: Hurts little more Pain Location: low back Pain Descriptors / Indicators: Aching, Sore Pain Intervention(s): Limited activity within patient's tolerance, Monitored during session, Repositioned HR stable and WFL throughout treatment session.    Home  Living Family/patient expects to be discharged to:: Private residence Living Arrangements: Spouse/significant other Available Help at Discharge: Family Type of Home: House Home Access: Stairs to enter Entrance Stairs-Rails: Right Entrance Stairs-Number of Steps: 2-3 from back entrance   [place]: One level [place]: Conservation officer, nature (2 wheels);Cane - single point      Prior Function Prior Level of Function : Independent/Modified Independent             Mobility Comments: Ambulatory with RW in house (sometimes uses RW outside as well); also uses SPC outside.       Hand Dominance        Extremity/Trunk Assessment   Upper Extremity Assessment Upper Extremity Assessment: Overall WFL for tasks assessed    Lower Extremity Assessment Lower Extremity Assessment: Generalized weakness       Communication   Communication: HOH  Cognition Arousal/Alertness: Awake/alert Behavior During Therapy: WFL for tasks assessed/performed Overall Cognitive Status: Within Functional Limits for tasks assessed                                 General Comments: Pt very HOH.        General Comments  Nursing cleared pt for participation in physical therapy.  Pt agreeable to PT session.    Exercises     Assessment/Plan    PT Assessment Patient needs continued PT services  PT Problem List Decreased strength;Decreased activity tolerance;Decreased balance;Decreased mobility       PT Treatment Interventions DME instruction;Gait training;Stair training;Functional mobility training;Therapeutic activities;Therapeutic exercise;Balance training;Patient/family education    PT Goals (Current goals can be found in the Care Plan section)  Acute Rehab PT Goals Patient Stated Goal: to go home PT Goal Formulation: With patient/family Time For Goal Achievement: 11/29/22 Potential to Achieve Goals: Good    Frequency Min 2X/week     Co-evaluation                AM-PAC PT "6 Clicks" Mobility  Outcome Measure Help needed turning from your back to your side while in a flat bed without using bedrails?: None Help needed moving from lying on your back to sitting on the side of a flat bed without using bedrails?: A Little Help needed moving to and from a bed to a chair (including a wheelchair)?: A Little Help needed standing up from a chair using your arms (e.g., wheelchair or bedside chair)?: A Little Help needed to walk in hospital room?: A Little Help needed climbing 3-5 steps with a railing? : A Little 6 Click Score: 19    End of Session Equipment Utilized During Treatment: Gait belt Activity Tolerance: Patient limited by fatigue Patient left: in bed;with call bell/phone within reach;with bed alarm set;with family/visitor present Nurse Communication: Mobility status;Precautions;Other (comment) (Pt's O2 sats during session) PT Visit Diagnosis: Other abnormalities of gait and mobility (R26.89);Muscle weakness (generalized) (M62.81)    Time: 1410-1434 PT Time Calculation (min) (ACUTE ONLY): 24 min   Charges:   PT Evaluation $PT Eval Low Complexity: 1 Low PT Treatments $Therapeutic Activity: 8-22 mins       Leitha Bleak, PT 11/15/22, 4:34 PM

## 2022-11-15 NOTE — Progress Notes (Addendum)
Mobility Specialist - Progress Note   Pre-mobility: SpO2(92) During mobility: SpO2(90) Post-mobility: SPO2(94)     11/15/22 1739  Mobility  Activity Ambulated with assistance in hallway;Transferred from bed to chair;Stood at bedside;Dangled on edge of bed  Level of Assistance Standby assist, set-up cues, supervision of patient - no hands on  Assistive Device Front wheel walker  Distance Ambulated (ft) 100 ft  Activity Response Tolerated well  Mobility Referral Yes  $Mobility charge 1 Mobility   Pt resting in bed on RA upon entry. Pt STS and ambulates to hallway SBA around NS. Pt returned to bed briefly before transferring to the recliner. Pt left in recliner with needs in reach and chair alarm activated.   Loma Sender Mobility Specialist 11/15/22, 5:42 PM

## 2022-11-15 NOTE — Progress Notes (Signed)
[facility] Progress Note  Patient: Michael [name]    [id number]  DOA: 11/12/2022     Date of Service: the patient was seen and examined on 11/15/2022  Chief Complaint  Patient presents with   Respiratory Distress   Shortness of Breath   Brief hospital course: Michael [name] is a 89 y.o. male with medical history significant of multiple medical issues including coronary artery disease, stage III CKD, diastolic dysfunction, history of SVT, GERD, Lewy body dementia presenting with aspiration pneumonia.    History primarily from patient's wife Jorel Nardone.  Per the wife, patient woke up to use the bathroom around 3 AM with significant weakness.  Patient has significant episodes of vomiting.  Because of the vomiting and weakness, the wife called EMS with patient having another episode of vomiting on EMS arrival.  Per the wife, patient has had overall worsening lethargy and confusion over the past 2 to 3 days.  Positive mild chills.  ED w/up: hemodynamically stable. Requiring 15 L nonrebreather to keep O2 sats greater than 95% initially. ABG grossly stable. Creatinine 1.9. White count 6, hemoglobin 13.5. Respiratory panel of negative for COVID flu and RSV. Troponin negative x 2. Chest x-ray with severe pneumonia on the left side. Started on Rocephin, azithromycin and Flagyl in the ER.    Assessment and Plan:  # Acute respiratory failure with hypoxia due to Aspiration  Pt required 7 L with high flow nasal cannula and nonrebreather on arrival, currently patient is using 2 to oxygen via nasal cannula. WBC 6, BNP mildly elevated in 200s without volume overload clinically.  CXR w/ severe L sided PNA S/p rocephin, flagyl and azithromycin in the ER  Continue ceftriaxone 2 g IV daily, azithromycin 500 mg IV daily and Flagyl 500 mg IV every 12 hourly. Continue supplemental O2 inhalation and gradually wean off Started Mucinex 600 twice daily, Tussionex as needed for cough Prn duonebs  3/5 Wbc 11.5  elevated   # Hypokalemia most likely due to diarrhea, 2/2 antibiotics 3/5 patient is poor historian, he is having diarrhea since yesterday and no one informed me. RN was advised to send stool sample if patient is having persistent diarrhea today Potassium repleted. Started probiotics Monitor electrolytes and replete as needed.   # Lewy body dementia ([place]) Per the wife, worsening confusion over the past few days in the setting of aspiration event Resumed Aricept Continue supportive care   # Coronary artery disease involving native coronary artery of native heart without angina pectoris No reported chest pain Troponin negative x 1 EKG stable   # PSVT (paroxysmal supraventricular tachycardia) Heart rate stable in 80s Continue home amiodarone and Cardizem      # CKD (chronic kidney disease) stage 3, GFR 30-59 ml/min (HCC) Baseline creatinine 1.4-1.7 Creatinine 1.88 --1.55 --1.14 Borderline decompensation Continue IV fluid for gentle hydration    # Essential hypertension Resumed losartan and Cardizem Home medications Use IV hydralazine as needed Monitor BP and titrate medications accordingly   Peripheral neuropathy, continued gabapentin Chronic pain, continued Percocet as needed home dose Insomnia, started melatonin 5 mg nightly Restless leg syndrome, patient is complaining of excessive leg movements.  Started Requip 0.25 mg p.o. 3 times daily   Body mass index is 28.49 kg/m.  Interventions:     Diet: Regular diet DVT Prophylaxis: Subcutaneous Lovenox   Advance goals of care discussion: Full code  Family Communication: family was not present at bedside, at the time of interview.  The pt provided permission  to discuss medical plan with the family. Opportunity was given to ask question and all questions were answered satisfactorily.   Disposition:  Pt is from Home, admitted with Resp Failure LLL aspiration PNA, still has Resp Failure, which precludes a safe  discharge. Discharge to Home, when clinically stable, may need few days to improve..  Subjective: No significant events overnight, patient is very hard of hearing and poor historian, he has been having diarrhea since yesterday, still on oxygen 2 L.  We will continue to monitor.   Physical Exam: General: NAD, lying comfortably Appear in no distress, affect appropriate Eyes: PERRLA ENT: Oral Mucosa Clear, moist. Hard of Hearing using Hearing Aids b/l  Neck: no JVD,  Cardiovascular: S1 and S2 Present, no Murmur,  Respiratory: Mild shortness of breath, mild bibasilar crackles greater on the left lower side, no wheezing.   Abdomen: Bowel Sound present, Soft and no tenderness,  Skin: no rashes Extremities: no Pedal edema, no calf tenderness Neurologic: without any new focal findings Gait not checked due to patient safety concerns  Vitals:   11/14/22 1511 11/14/22 2124 11/15/22 0546 11/15/22 0824  BP: (!) 146/86 (!) 157/99 (!) 188/92 (!) 179/88  Pulse: 61 68 68 66  Resp: '20 20 18 18  '$ Temp:  98.6 F (37 C) 98.4 F (36.9 C) 98.2 F (36.8 C)  TempSrc:   Oral   SpO2: 98%  97% 94%  Weight:      Height:        Intake/Output Summary (Last 24 hours) at 11/15/2022 1242 Last data filed at 11/15/2022 1107 Gross per 24 hour  Intake --  Output 3975 ml  Net -3975 ml   Filed Weights   11/12/22 0644 11/12/22 1035  Weight: 89.8 kg 87.5 kg    Data Reviewed: I have personally reviewed and interpreted daily labs, tele strips, imagings as discussed above. I reviewed all nursing notes, pharmacy notes, vitals, pertinent old records I have discussed plan of care as described above with RN and patient/family.  CBC: Recent Labs  Lab 11/12/22 0647 11/13/22 0446 11/14/22 0425 11/15/22 0537  WBC 6.0 9.4 8.6 11.5*  NEUTROABS 4.6  --   --   --   HGB 13.5 10.6* 9.5* 10.7*  HCT 42.0 32.4* 29.3* 33.0*  MCV 99.5 98.5 98.7 97.9  PLT 302 188 181 0000000   Basic Metabolic Panel: Recent Labs  Lab  11/12/22 0647 11/13/22 0443 11/13/22 0446 11/14/22 0425 11/15/22 0537  NA 139  --  143 140 137  K 3.7  --  4.0 3.5 2.7*  CL 104  --  109 108 104  CO2 24  --  '27 23 28  '$ GLUCOSE 120*  --  123* 91 85  BUN 41*  --  36* 42* 30*  CREATININE 1.88*  --  1.55* 1.49* 1.14  CALCIUM 9.2  --  8.6* 8.2* 8.5*  MG 2.4  --   --  2.1 2.0  PHOS  --  3.9  --  3.1 2.8    Studies: No results found.  Scheduled Meds:  amiodarone  100 mg Oral Daily   calcium carbonate  1 tablet Oral Daily   diltiazem  120 mg Oral Daily   donepezil  5 mg Oral QHS   enoxaparin (LOVENOX) injection  40 mg Subcutaneous Q24H   gabapentin  600 mg Oral QHS   guaiFENesin  600 mg Oral BID   levothyroxine  50 mcg Oral Q0600   losartan  50 mg Oral BID  melatonin  5 mg Oral QPM   pantoprazole  40 mg Oral Daily   potassium chloride  40 mEq Oral Q4H   pravastatin  20 mg Oral q1800   rOPINIRole  0.25 mg Oral TID   saccharomyces boulardii  250 mg Oral BID   Continuous Infusions:  azithromycin 500 mg (11/15/22 1034)   cefTRIAXone (ROCEPHIN)  IV 2 g (11/15/22 0946)   metronidazole 500 mg (11/15/22 1229)   PRN Meds: acetaminophen, chlorpheniramine-HYDROcodone, hydrALAZINE, ipratropium-albuterol, ondansetron **OR** ondansetron (ZOFRAN) IV, oxyCODONE-acetaminophen  Time spent: 35 minutes  Author: Val Riles. MD [facility] 11/15/2022 12:42 PM  To reach On-call, see care teams to locate the attending and reach out to them via [email or web address]. If 7PM-7AM, please contact night-coverage If you still have difficulty reaching the attending provider, please page the [facility] (Director on Call) for [facility] on amion for assistance.

## 2022-11-15 NOTE — Plan of Care (Signed)
Lab called with critical K+ of 2.7.  Notified on call NP, Neomia Glass.

## 2022-11-16 DIAGNOSIS — J9601 Acute respiratory failure with hypoxia: Secondary | ICD-10-CM | POA: Diagnosis not present

## 2022-11-16 DIAGNOSIS — E876 Hypokalemia: Secondary | ICD-10-CM | POA: Diagnosis not present

## 2022-11-16 LAB — CBC
HCT: 32 % — ABNORMAL LOW (ref 39.0–52.0)
Hemoglobin: 10.5 g/dL — ABNORMAL LOW (ref 13.0–17.0)
MCH: 32 pg (ref 26.0–34.0)
MCHC: 32.8 g/dL (ref 30.0–36.0)
MCV: 97.6 fL (ref 80.0–100.0)
Platelets: 187 10*3/uL (ref 150–400)
RBC: 3.28 MIL/uL — ABNORMAL LOW (ref 4.22–5.81)
RDW: 14.4 % (ref 11.5–15.5)
WBC: 6.8 10*3/uL (ref 4.0–10.5)
nRBC: 0 % (ref 0.0–0.2)

## 2022-11-16 LAB — PHOSPHORUS: Phosphorus: 3 mg/dL (ref 2.5–4.6)

## 2022-11-16 LAB — MAGNESIUM: Magnesium: 2 mg/dL (ref 1.7–2.4)

## 2022-11-16 LAB — BASIC METABOLIC PANEL
Anion gap: 8 (ref 5–15)
BUN: 19 mg/dL (ref 8–23)
CO2: 26 mmol/L (ref 22–32)
Calcium: 8.1 mg/dL — ABNORMAL LOW (ref 8.9–10.3)
Chloride: 106 mmol/L (ref 98–111)
Creatinine, Ser: 1.18 mg/dL (ref 0.61–1.24)
GFR, Estimated: 59 mL/min — ABNORMAL LOW (ref >60)
Glucose, Bld: 90 mg/dL (ref 70–99)
Potassium: 3 mmol/L — ABNORMAL LOW (ref 3.5–5.1)
Sodium: 140 mmol/L (ref 135–145)

## 2022-11-16 MED ORDER — POTASSIUM CHLORIDE 10 MEQ/100ML IV SOLN
10.0000 meq | INTRAVENOUS | Status: AC
  Administered 2022-11-16 (×4): 10 meq via INTRAVENOUS

## 2022-11-16 MED ORDER — POTASSIUM CHLORIDE CRYS ER 20 MEQ PO TBCR
40.0000 meq | EXTENDED_RELEASE_TABLET | ORAL | Status: AC
  Administered 2022-11-16 (×2): 40 meq via ORAL
  Filled 2022-11-16 (×2): qty 2

## 2022-11-16 MED ORDER — POTASSIUM CHLORIDE 10 MEQ/100ML IV SOLN
10.0000 meq | INTRAVENOUS | Status: DC
  Filled 2022-11-16: qty 100

## 2022-11-16 MED ORDER — POTASSIUM CHLORIDE CRYS ER 20 MEQ PO TBCR: 40.0000 meq | EXTENDED_RELEASE_TABLET | ORAL | Status: DC

## 2022-11-16 NOTE — Progress Notes (Signed)
Physical Therapy Treatment Patient Details Name: Michael [name] MRN: [id number] DOB: November 17, 1931 Today's Date: 11/16/2022   History of Present Illness Pt is an 89 y.o. male presenting to hospital 11/12/22 for evaluation of respiratory distress and SOB (hypoxic to 50's on room air) and vomiting.  Pt admitted with aspiration PNA, acute respiratory failure with hypoxia, and Lewy body dementia.  Noted to have diarrhea during hospitalization.  PMH includes Lewy body dementia, htn, CKD, CAD, SVT, HOH, R RCR, L partial mastectomy, R THA, L TKA.    PT Comments    Pt is making good progress towards goals with ability to ambulate around RN station and perform standing there-ex. Safe technique with use of RW. Will continue to progress as able.  Recommendations for follow up therapy are one component of a multi-disciplinary discharge planning process, led by the attending physician.  Recommendations may be updated based on patient status, additional functional criteria and insurance authorization.  Follow Up Recommendations  Home health PT     Assistance Recommended at Discharge Intermittent Supervision/Assistance  Patient can return home with the following A little help with walking and/or transfers;A little help with bathing/dressing/bathroom;Assistance with cooking/housework;Assist for transportation;Help with stairs or ramp for entrance   Equipment Recommendations  Other (comment)    Recommendations for Other Services       Precautions / Restrictions Precautions Precautions: Fall Precaution Comments: Aspiration Restrictions Weight Bearing Restrictions: No     Mobility  Bed Mobility Overal bed mobility: Modified Independent Bed Mobility: Supine to Sit     Supine to sit: Modified independent (Device/Increase time)     General bed mobility comments: safe technique with upright posture.    Transfers Overall transfer level: Modified independent Equipment used: Rolling walker (2  wheels) Transfers: Sit to/from Stand Sit to Stand: Modified independent (Device/Increase time)           General transfer comment: upright posture with safe technique    Ambulation/Gait Ambulation/Gait assistance: Min guard Gait Distance (Feet): 200 Feet Assistive device: Rolling walker (2 wheels) Gait Pattern/deviations: Step-through pattern       General Gait Details: ambulated using reciprocal gait pattern and safe technique. Needs cues for directions   Stairs             Wheelchair Mobility    Modified Rankin (Stroke Patients Only)       Balance Overall balance assessment: Needs assistance Sitting-balance support: No upper extremity supported, Feet supported Sitting balance-Leahy Scale: Normal     Standing balance support: During functional activity, Single extremity supported, No upper extremity supported Standing balance-Leahy Scale: Good                              Cognition Arousal/Alertness: Awake/alert Behavior During Therapy: WFL for tasks assessed/performed Overall Cognitive Status: History of cognitive impairments - at baseline                                 General Comments: H/o Lewy Body Dementia at baseline. Followed all commands well, very HOH.        Exercises Other Exercises Other Exercises: standing ther-ex in hallway including low back stretches, squats, and alt marching with B hand support on railing.    General Comments        Pertinent Vitals/Pain Pain Assessment Pain Assessment: Faces Faces Pain Scale: Hurts a little bit Pain Location: low back Pain  Descriptors / Indicators: Aching, Sore Pain Intervention(s): Limited activity within patient's tolerance, Repositioned    Home Living Family/patient expects to be discharged to:: Private residence Living Arrangements: Spouse/significant other Available Help at Discharge: Family Type of Home: House Home Access: Stairs to enter Entrance  Stairs-Rails: Right Entrance Stairs-Number of Steps: 2-3 from back entrance   Home Layout: One level Home Equipment: Conservation officer, nature (2 wheels);Cane - single point;Shower seat - built in      Prior Function            PT Goals (current goals can now be found in the care plan section) Acute Rehab PT Goals Patient Stated Goal: to go home PT Goal Formulation: With patient/family Time For Goal Achievement: 11/29/22 Potential to Achieve Goals: Good Progress towards PT goals: Progressing toward goals    Frequency    Min 2X/week      PT Plan Current plan remains appropriate    Co-evaluation              AM-PAC PT "6 Clicks" Mobility   Outcome Measure  Help needed turning from your back to your side while in a flat bed without using bedrails?: None Help needed moving from lying on your back to sitting on the side of a flat bed without using bedrails?: None Help needed moving to and from a bed to a chair (including a wheelchair)?: None Help needed standing up from a chair using your arms (e.g., wheelchair or bedside chair)?: None Help needed to walk in hospital room?: A Little Help needed climbing 3-5 steps with a railing? : A Little 6 Click Score: 22    End of Session Equipment Utilized During Treatment: Gait belt Activity Tolerance: Patient tolerated treatment well Patient left: in chair;with chair alarm set Nurse Communication: Mobility status PT Visit Diagnosis: Other abnormalities of gait and mobility (R26.89);Muscle weakness (generalized) (M62.81)     Time: [id number] PT Time Calculation (min) (ACUTE ONLY): 24 min  Charges:  $Gait Training: 8-22 mins $Therapeutic Exercise: 8-22 mins                     Greggory Stallion, PT, DPT, GCS [phone]    Koralynn Greenspan 11/16/2022, 3:11 PM

## 2022-11-16 NOTE — Progress Notes (Signed)
[facility] Progress Note  Patient: Michael [name]    [id number]  DOA: 11/12/2022     Date of Service: the patient was seen and examined on 11/16/2022  Chief Complaint  Patient presents with   Respiratory Distress   Shortness of Breath   Brief hospital course: Michael [name] is a 89 y.o. male with medical history significant of multiple medical issues including coronary artery disease, stage III CKD, diastolic dysfunction, history of SVT, GERD, Lewy body dementia presenting with aspiration pneumonia.    History primarily from patient's wife Michael [name].  Per the wife, patient woke up to use the bathroom around 3 AM with significant weakness.  Patient has significant episodes of vomiting.  Because of the vomiting and weakness, the wife called EMS with patient having another episode of vomiting on EMS arrival.  Per the wife, patient has had overall worsening lethargy and confusion over the past 2 to 3 days.  Positive mild chills.  ED w/up: hemodynamically stable. Requiring 15 L nonrebreather to keep O2 sats greater than 95% initially. ABG grossly stable. Creatinine 1.9. White count 6, hemoglobin 13.5. Respiratory panel of negative for COVID flu and RSV. Troponin negative x 2. Chest x-ray with severe pneumonia on the left side. Started on Rocephin, azithromycin and Flagyl in the ER.    Assessment and Plan:  # Acute respiratory failure with hypoxia due to Aspiration  Pt required 7 L with high flow nasal cannula and nonrebreather on arrival, currently patient is using 2 to oxygen via nasal cannula. WBC 6, BNP mildly elevated in 200s without volume overload clinically.  CXR w/ severe L sided PNA S/p rocephin, flagyl and azithromycin in the ER  Continue ceftriaxone 2 g IV daily, azithromycin 500 mg IV daily and Flagyl 500 mg IV every 12 hourly. Continue supplemental O2 inhalation and gradually wean off Started Mucinex 600 twice daily, Tussionex as needed for cough Prn duonebs  3/5 Wbc 11.5  elevated   # Hypokalemia most likely due to diarrhea, 2/2 antibiotics 3/5 patient is poor historian, he is having diarrhea since yesterday and no one informed me. RN was advised to send stool sample if patient is having persistent diarrhea today Potassium repleted. Started probiotics Monitor electrolytes and replete as needed.   # Lewy body dementia (Michael [name]) Per the wife, worsening confusion over the past few days in the setting of aspiration event Resumed Aricept Continue supportive care   # Coronary artery disease involving native coronary artery of native heart without angina pectoris No reported chest pain Troponin negative x 1 EKG stable   # PSVT (paroxysmal supraventricular tachycardia) Heart rate stable in 80s Continue home amiodarone and Cardizem      # CKD (chronic kidney disease) stage 3, GFR 30-59 ml/min (HCC) Baseline creatinine 1.4-1.7 Creatinine 1.88 --1.55 --1.14 Borderline decompensation Continue IV fluid for gentle hydration    # Essential hypertension Resumed losartan and Cardizem Home medications Use IV hydralazine as needed Monitor BP and titrate medications accordingly   Peripheral neuropathy, continued gabapentin Chronic pain, continued Percocet as needed home dose Insomnia, started melatonin 5 mg nightly Restless leg syndrome, patient is complaining of excessive leg movements.  Started Requip 0.25 mg p.o. 3 times daily   Body mass index is 28.49 kg/m.  Interventions:     Diet: Regular diet DVT Prophylaxis: Subcutaneous Lovenox   Advance goals of care discussion: Full code  Family Communication: family was not present at bedside, at the time of interview.  The pt provided permission  to discuss medical plan with the family. Opportunity was given to ask question and all questions were answered satisfactorily.  3/6 discussed with patient's wife over the phone.  Disposition:  Pt is from Home, admitted with Resp Failure LLL aspiration PNA,  developed diarrhea, still has low potassium, which precludes a safe discharge. Discharge to Home, most likely tomorrow a.m.  Subjective: No significant events overnight, diarrhea has been stopped, breathing is better now.  Patient denies any active issues.  He is ambulatory walking in the hallway. Potassium is still low so we will keep him today and plan for disposition tomorrow a.m.   Physical Exam: General: NAD, lying comfortably Appear in no distress, affect appropriate Eyes: PERRLA ENT: Oral Mucosa Clear, moist. Hard of Hearing using Hearing Aids b/l  Neck: no JVD,  Cardiovascular: S1 and S2 Present, no Murmur,  Respiratory: Mild shortness of breath, mild bibasilar crackles greater on the left lower side, no wheezing.   Abdomen: Bowel Sound present, Soft and no tenderness,  Skin: no rashes Extremities: no Pedal edema, no calf tenderness Neurologic: without any new focal findings Gait not checked due to patient safety concerns  Vitals:   11/15/22 1616 11/15/22 1944 11/16/22 0427 11/16/22 0756  BP: (!) 177/44 (!) 153/89 (!) 169/89 (!) 153/80  Pulse: 70 63 66 65  Resp: '16 18 18 16  '$ Temp: 98.4 F (36.9 C) 98.2 F (36.8 C) 98.4 F (36.9 C) 98.1 F (36.7 C)  TempSrc:  Oral Oral   SpO2: 94% 90% (!) 88% 92%  Weight:      Height:        Intake/Output Summary (Last 24 hours) at 11/16/2022 1301 Last data filed at 11/16/2022 0758 Gross per 24 hour  Intake 1200 ml  Output 1900 ml  Net -700 ml   Filed Weights   11/12/22 0644 11/12/22 1035  Weight: 89.8 kg 87.5 kg    Data Reviewed: I have personally reviewed and interpreted daily labs, tele strips, imagings as discussed above. I reviewed all nursing notes, pharmacy notes, vitals, pertinent old records I have discussed plan of care as described above with RN and patient/family.  CBC: Recent Labs  Lab 11/12/22 0647 11/13/22 0446 11/14/22 0425 11/15/22 0537 11/16/22 0329  WBC 6.0 9.4 8.6 11.5* 6.8  NEUTROABS 4.6  --   --    --   --   HGB 13.5 10.6* 9.5* 10.7* 10.5*  HCT 42.0 32.4* 29.3* 33.0* 32.0*  MCV 99.5 98.5 98.7 97.9 97.6  PLT 302 188 181 195 [id number]   Basic Metabolic Panel: Recent Labs  Lab 11/12/22 0647 11/13/22 0443 11/13/22 0446 11/14/22 0425 11/15/22 0537 11/16/22 0329  NA 139  --  143 140 137 140  K 3.7  --  4.0 3.5 2.7* 3.0*  CL 104  --  109 108 104 106  CO2 24  --  '27 23 28 26  '$ GLUCOSE 120*  --  123* 91 85 90  BUN 41*  --  36* 42* 30* 19  CREATININE 1.88*  --  1.55* 1.49* 1.14 1.18  CALCIUM 9.2  --  8.6* 8.2* 8.5* 8.1*  MG 2.4  --   --  2.1 2.0 2.0  PHOS  --  3.9  --  3.1 2.8 3.0    Studies: No results found.  Scheduled Meds:  amiodarone  100 mg Oral Daily   calcium carbonate  1 tablet Oral Daily   diltiazem  120 mg Oral Daily   donepezil  5 mg Oral QHS  enoxaparin (LOVENOX) injection  40 mg Subcutaneous Q24H   gabapentin  600 mg Oral QHS   guaiFENesin  600 mg Oral BID   levothyroxine  50 mcg Oral Q0600   losartan  50 mg Oral BID   melatonin  5 mg Oral QPM   pantoprazole  40 mg Oral Daily   potassium chloride  40 mEq Oral Q4H   pravastatin  20 mg Oral q1800   rOPINIRole  0.25 mg Oral TID   saccharomyces boulardii  250 mg Oral BID   Continuous Infusions:  azithromycin 500 mg (11/16/22 0919)   cefTRIAXone (ROCEPHIN)  IV 2 g (11/16/22 0837)   metronidazole 500 mg (11/16/22 1035)   potassium chloride 10 mEq (11/16/22 1145)   PRN Meds: acetaminophen, chlorpheniramine-HYDROcodone, hydrALAZINE, ipratropium-albuterol, ondansetron **OR** ondansetron (ZOFRAN) IV, oxyCODONE-acetaminophen  Time spent: 35 minutes  Author: Val Riles. MD [facility] 11/16/2022 1:01 PM  To reach On-call, see care teams to locate the attending and reach out to them via [email or web address]. If 7PM-7AM, please contact night-coverage If you still have difficulty reaching the attending provider, please page the [facility] (Director on Call) for [facility] on amion for assistance.

## 2022-11-16 NOTE — Evaluation (Signed)
Occupational Therapy Evaluation Patient Details Name: Michael [name] MRN: [id number] DOB: December 25, 1931 Today's Date: 11/16/2022   History of Present Illness Pt is an 89 y.o. male presenting to hospital 11/12/22 for evaluation of respiratory distress and SOB (hypoxic to 50's on room air) and vomiting.  Pt admitted with aspiration PNA, acute respiratory failure with hypoxia, and Lewy body dementia.  Noted to have diarrhea during hospitalization.  PMH includes Lewy body dementia, htn, CKD, CAD, SVT, HOH, R RCR, L partial mastectomy, R THA, L TKA.   Clinical Impression   Patient agreeable to OT evaluation. Pt presenting with decreased independence in self care, balance, functional mobility/transfers, and endurance. PTA pt lived with spouse, was independent for ADLs/IADLs, and Mod I for functional mobility using a RW or SPC. Pt currently functioning at supervision for bed mobility, Min guard for simulated toilet transfer, and supervision-CGA for functional mobility to the bathroom using RW. Pt completed clothing management in standing and sinkside grooming with supervision this date. Pt will benefit from acute OT to increase overall independence in the areas of ADLs and functional mobility in order to safely discharge to next venue of care. OT recommends ongoing therapy upon discharge to maximize safety and independence with ADLs, decrease fall risk, decrease caregiver burden, and promote return to PLOF.     Recommendations for follow up therapy are one component of a multi-disciplinary discharge planning process, led by the attending physician.  Recommendations may be updated based on patient status, additional functional criteria and insurance authorization.   Follow Up Recommendations  Home health OT     Assistance Recommended at Discharge Intermittent Supervision/Assistance  Patient can return home with the following A little help with walking and/or transfers;A little help with  bathing/dressing/bathroom;Assistance with cooking/housework;Assist for transportation;Help with stairs or ramp for entrance    Functional Status Assessment  Patient has had a recent decline in their functional status and demonstrates the ability to make significant improvements in function in a reasonable and predictable amount of time.  Equipment Recommendations  None recommended by OT    Recommendations for Other Services       Precautions / Restrictions Precautions Precautions: Fall Precaution Comments: Aspiration Restrictions Weight Bearing Restrictions: No      Mobility Bed Mobility Overal bed mobility: Needs Assistance Bed Mobility: Supine to Sit     Supine to sit: HOB elevated, Supervision          Transfers Overall transfer level: Needs assistance Equipment used: Rolling walker (2 wheels) Transfers: Sit to/from Stand Sit to Stand: Min guard           General transfer comment: STS from EOB      Balance Overall balance assessment: Needs assistance Sitting-balance support: No upper extremity supported, Feet supported Sitting balance-Leahy Scale: Normal     Standing balance support: During functional activity, Single extremity supported, No upper extremity supported Standing balance-Leahy Scale: Good                             ADL either performed or assessed with clinical judgement   ADL Overall ADL's : Needs assistance/impaired     Grooming: Set up;Supervision/safety;Standing;Wash/dry face;Wash/dry hands;Brushing hair Grooming Details (indicate cue type and reason): tolerated sinkside grooming for ~10 min, intermittent UE support 2/2 nerve pain in back         Upper Body Dressing : Set up;Sitting       Toilet Transfer: Supervision/safety;Regular Toilet;Rolling walker (2 wheels);Ambulation Toilet  Transfer Details (indicate cue type and reason): pt requesting to urinate in standing, continent void on toilet. supervision in static  standing, no UE support. Toileting- Clothing Manipulation and Hygiene: Supervision/safety;Sit to/from stand       Functional mobility during ADLs: Min guard;Rolling walker (2 wheels);Supervision/safety (~15 ft to the bathroom)       Vision Baseline Vision/History: 1 Wears glasses Patient Visual Report: No change from baseline       Perception     Praxis      Pertinent Vitals/Pain Pain Assessment Pain Assessment: Faces Faces Pain Scale: Hurts little more Pain Location: low back Pain Descriptors / Indicators: Aching, Sore Pain Intervention(s): Limited activity within patient's tolerance, Monitored during session, Repositioned     Hand Dominance     Extremity/Trunk Assessment Upper Extremity Assessment Upper Extremity Assessment: Generalized weakness   Lower Extremity Assessment Lower Extremity Assessment: Generalized weakness (pt endorses neuropathy at baseline)       Communication Communication Communication: HOH   Cognition Arousal/Alertness: Awake/alert Behavior During Therapy: WFL for tasks assessed/performed Overall Cognitive Status: History of cognitive impairments - at baseline                                 General Comments: H/o Lewy Body Dementia at baseline. Followed all commands well, very HOH.     General Comments       Exercises Other Exercises Other Exercises: OT provided education re: role of OT, OT POC, post acute recs, sitting up for all meals, EOB/OOB mobility with assistance, home/fall safety.     Shoulder Instructions      Home Living Family/patient expects to be discharged to:: Private residence Living Arrangements: Spouse/significant other Available Help at Discharge: Family Type of Home: House Home Access: Stairs to enter [employer] of Steps: 2-3 from back entrance Entrance Stairs-Rails: Right Home Layout: One level     Bathroom Shower/Tub: Occupational psychologist: Handicapped height      [place]: Conservation officer, nature (2 wheels);Cane - single point;Shower seat - built in          Prior Functioning/Environment Prior Level of Function : Independent/Modified Independent;Driving             Mobility Comments: Ambulatory with RW in house (sometimes uses RW outside as well); also uses SPC outside. ADLs Comments: Pt reports IND with ADLs/IADLs, pt and wife take turns driving.        OT Problem List: Pain;Decreased strength;Decreased activity tolerance;Impaired balance (sitting and/or standing);Decreased cognition      OT Treatment/Interventions: Self-care/ADL training;Therapeutic exercise;Neuromuscular education;Energy conservation;DME and/or AE instruction;Manual therapy;Modalities;Balance training;Patient/family education;Visual/perceptual remediation/compensation;Cognitive remediation/compensation;Therapeutic activities;Splinting    OT Goals(Current goals can be found in the care plan section) Acute Rehab OT Goals Patient Stated Goal: return home OT Goal Formulation: With patient Time For Goal Achievement: 11/30/22 Potential to Achieve Goals: Good   OT Frequency: Min 2X/week    Co-evaluation              AM-PAC OT "6 Clicks" Daily Activity     Outcome Measure Help from another person eating meals?: None Help from another person taking care of personal grooming?: None Help from another person toileting, which includes using toliet, bedpan, or urinal?: A Little Help from another person bathing (including washing, rinsing, drying)?: A Little Help from another person to put on and taking off regular upper body clothing?: None Help from another person to put on and  taking off regular lower body clothing?: A Little 6 Click Score: 21   End of Session Equipment Utilized During Treatment: Gait belt;Rolling walker (2 wheels) Nurse Communication: Mobility status  Activity Tolerance: Patient tolerated treatment well Patient left: in chair;with call bell/phone  within reach;with chair alarm set  OT Visit Diagnosis: Other abnormalities of gait and mobility (R26.89);Muscle weakness (generalized) (M62.81);Pain Pain - part of body:  (back)                Time: [id number] OT Time Calculation (min): 26 min Charges:  OT General Charges $OT Visit: 1 Visit OT Evaluation $OT Eval Low Complexity: 1 Low  [employer] MS, OTR/L ascom [phone]  11/16/22, 12:35 PM

## 2022-11-16 NOTE — Progress Notes (Signed)
Mobility Specialist - Progress Note   11/16/22 1012  Mobility  Activity Ambulated with assistance in hallway  Level of Assistance Standby assist, set-up cues, supervision of patient - no hands on  Assistive Device Front wheel walker  Distance Ambulated (ft) 160 ft  Activity Response Tolerated well  Mobility Referral Yes  $Mobility charge 1 Mobility   Pt sitting in recliner on RA upon arrival. Pt STS and ambulates in hallway SBA with no LOB noted. Pt returns to recliner with needs in reach and chair alarm set.   Gretchen Short  Mobility Specialist  11/16/22 10:13 AM

## 2022-11-17 DIAGNOSIS — J690 Pneumonitis due to inhalation of food and vomit: Secondary | ICD-10-CM | POA: Diagnosis not present

## 2022-11-17 LAB — BASIC METABOLIC PANEL
Anion gap: 7 (ref 5–15)
BUN: 21 mg/dL (ref 8–23)
CO2: 25 mmol/L (ref 22–32)
Calcium: 8.3 mg/dL — ABNORMAL LOW (ref 8.9–10.3)
Chloride: 108 mmol/L (ref 98–111)
Creatinine, Ser: 1.15 mg/dL (ref 0.61–1.24)
GFR, Estimated: >60 mL/min (ref >60)
Glucose, Bld: 84 mg/dL (ref 70–99)
Potassium: 3.9 mmol/L (ref 3.5–5.1)
Sodium: 140 mmol/L (ref 135–145)

## 2022-11-17 LAB — CULTURE, BLOOD (ROUTINE X 2)
Culture: NO GROWTH
Culture: NO GROWTH
Special Requests: ADEQUATE
Special Requests: ADEQUATE

## 2022-11-17 LAB — CBC
HCT: 31.3 % — ABNORMAL LOW (ref 39.0–52.0)
Hemoglobin: 10.5 g/dL — ABNORMAL LOW (ref 13.0–17.0)
MCH: 31.9 pg (ref 26.0–34.0)
MCHC: 33.5 g/dL (ref 30.0–36.0)
MCV: 95.1 fL (ref 80.0–100.0)
Platelets: 210 10*3/uL (ref 150–400)
RBC: 3.29 MIL/uL — ABNORMAL LOW (ref 4.22–5.81)
RDW: 14.6 % (ref 11.5–15.5)
WBC: 5.9 10*3/uL (ref 4.0–10.5)
nRBC: 0 % (ref 0.0–0.2)

## 2022-11-17 MED ORDER — ROPINIROLE HCL 0.5 MG PO TABS: 0.2500 mg | ORAL_TABLET | Freq: Every day | ORAL | 0 refills | Status: DC

## 2022-11-17 MED ORDER — TRAZODONE HCL 50 MG PO TABS: 50.0000 mg | ORAL_TABLET | Freq: Every day | ORAL | 0 refills | Status: DC

## 2022-11-17 MED ORDER — AMOXICILLIN-POT CLAVULANATE 875-125 MG PO TABS: 1.0000 | ORAL_TABLET | Freq: Two times a day (BID) | ORAL | 0 refills | Status: AC

## 2022-11-17 MED ORDER — MELATONIN 5 MG PO TABS: 5.0000 mg | ORAL_TABLET | Freq: Every evening | ORAL | 2 refills | Status: AC

## 2022-11-17 MED ORDER — GUAIFENESIN ER 600 MG PO TB12: 600.0000 mg | ORAL_TABLET | Freq: Two times a day (BID) | ORAL | 0 refills | Status: AC

## 2022-11-17 NOTE — Progress Notes (Signed)
Physical Therapy Treatment Patient Details Name: Michael [name] MRN: [id number] DOB: 1931-12-19 Today's Date: 11/17/2022   History of Present Illness Pt is an 89 y.o. male presenting to hospital 11/12/22 for evaluation of respiratory distress and SOB (hypoxic to 50's on room air) and vomiting.  Pt admitted with aspiration PNA, acute respiratory failure with hypoxia, and Lewy body dementia.  Noted to have diarrhea during hospitalization.  PMH includes Lewy body dementia, htn, CKD, CAD, SVT, HOH, R RCR, L partial mastectomy, R THA, L TKA.    PT Comments    Pt resting in recliner upon PT arrival and appearing eager to get up and move; pt agreeable to therapy.  During session pt modified independent with transfers with RW use; SBA with ambulation 280 feet using RW; and CGA navigating 6 steps with B UE support on railing.  Pt steady with functional mobility during sessions activities.  O2 sats 95% or greater on room air during session.  Pt reports plan to discharge home today with HHPT and support of his wife.   Recommendations for follow up therapy are one component of a multi-disciplinary discharge planning process, led by the attending physician.  Recommendations may be updated based on patient status, additional functional criteria and insurance authorization.  Follow Up Recommendations  Home health PT     Assistance Recommended at Discharge Intermittent Supervision/Assistance  Patient can return home with the following A little help with walking and/or transfers;A little help with bathing/dressing/bathroom;Assistance with cooking/housework;Assist for transportation;Help with stairs or ramp for entrance   Equipment Recommendations  Other (comment) (pt has RW at home already)    Recommendations for Other Services       Precautions / Restrictions Precautions Precautions: Fall Precaution Comments: Aspiration Restrictions Weight Bearing Restrictions: No     Mobility  Bed Mobility                General bed mobility comments: Deferred (pt in recliner beginning/end of session)    Transfers Overall transfer level: Modified independent Equipment used: Rolling walker (2 wheels) Transfers: Sit to/from Stand Sit to Stand: Modified independent (Device/Increase time)           General transfer comment: steady safe transfer from recliner    Ambulation/Gait Ambulation/Gait assistance: Supervision Gait Distance (Feet): 280 Feet Assistive device: Rolling walker (2 wheels) Gait Pattern/deviations: Step-through pattern       General Gait Details: steady ambulation with RW use   Stairs Stairs: Yes Stairs assistance: Min guard Stair Management: One rail Right Number of Stairs: 6 General stair comments: alternating pattern forward up steps; sidestepping step to pattern down steps; B UE's on railing   Wheelchair Mobility    Modified Rankin (Stroke Patients Only)       Balance Overall balance assessment: Needs assistance Sitting-balance support: No upper extremity supported, Feet supported Sitting balance-Leahy Scale: Normal Sitting balance - Comments: steady sitting reaching outside BOS   Standing balance support: During functional activity, Bilateral upper extremity supported Standing balance-Leahy Scale: Good Standing balance comment: steady ambulating with RW use                            Cognition Arousal/Alertness: Awake/alert Behavior During Therapy: WFL for tasks assessed/performed Overall Cognitive Status: History of cognitive impairments - at baseline  General Comments: Very HOH.        Exercises      General Comments  Nursing cleared pt for participation in physical therapy.  Pt agreeable to PT session.      Pertinent Vitals/Pain Pain Assessment Pain Assessment: Faces Faces Pain Scale: Hurts little more Pain Location: low back Pain Descriptors / Indicators: Aching,  Sore Pain Intervention(s): Limited activity within patient's tolerance, Monitored during session, Repositioned HR WFL during sessions activities.    Home Living                          Prior Function            PT Goals (current goals can now be found in the care plan section) Acute Rehab PT Goals Patient Stated Goal: to go home PT Goal Formulation: With patient/family Time For Goal Achievement: 11/29/22 Potential to Achieve Goals: Good Progress towards PT goals: Progressing toward goals    Frequency    Min 2X/week      PT Plan Current plan remains appropriate    Co-evaluation              AM-PAC PT "6 Clicks" Mobility   Outcome Measure  Help needed turning from your back to your side while in a flat bed without using bedrails?: None Help needed moving from lying on your back to sitting on the side of a flat bed without using bedrails?: None Help needed moving to and from a bed to a chair (including a wheelchair)?: None Help needed standing up from a chair using your arms (e.g., wheelchair or bedside chair)?: None Help needed to walk in hospital room?: A Little Help needed climbing 3-5 steps with a railing? : A Little 6 Click Score: 22    End of Session Equipment Utilized During Treatment: Gait belt Activity Tolerance: Patient tolerated treatment well Patient left: in chair;with call bell/phone within reach;with chair alarm set Nurse Communication: Mobility status;Precautions PT Visit Diagnosis: Other abnormalities of gait and mobility (R26.89);Muscle weakness (generalized) (M62.81)     Time: [id number] PT Time Calculation (min) (ACUTE ONLY): 16 min  Charges:  $Gait Training: 8-22 mins                     Leitha Bleak, PT 11/17/22, 12:05 PM

## 2022-11-17 NOTE — TOC Transition Note (Signed)
Transition of Care [facility]) - CM/SW Discharge Note   Patient Details  Name: Michael [name] MRN: [id number] Date of Birth: 06-Mar-1932  [facility] CM/SW Contact:  Gerilyn Pilgrim, LCSW Phone Number: 11/17/2022, 10:46 AM   Clinical Narrative:   Pt to discharge home with [facility] PT/OT/ [facility] notified. No additional TOC needs at this time. CSW signing off.          Patient Goals and CMS Choice      Discharge Placement                         Discharge Plan and Services Additional resources added to the After Visit Summary for                                       Social Determinants of Health (SDOH) Interventions SDOH Screenings   Food Insecurity: No Food Insecurity (11/13/2022)  Housing: Low Risk  (11/13/2022)  Transportation Needs: No Transportation Needs (11/13/2022)  Utilities: Not At Risk (11/13/2022)  Depression (PHQ2-9): Low Risk  (05/11/2020)  Tobacco Use: Low Risk  (11/12/2022)     Readmission Risk Interventions     No data to display

## 2022-11-17 NOTE — Discharge Summary (Signed)
[facility] Discharge Summary   Patient: Michael [name] [id number]  PCP: Juluis Pitch, MD  Date of admission: 11/12/2022   Date of discharge:  11/17/2022     Discharge Diagnoses:  Active Problems:   Aspiration pneumonia (HCC)   Essential hypertension   CKD (chronic kidney disease) stage 3, GFR 30-59 ml/min (HCC)   PSVT (paroxysmal supraventricular tachycardia) (HCC)   Coronary artery disease involving native coronary artery of native heart without angina pectoris   Lewy body dementia ([place])   Acute respiratory failure with hypoxia ([place])   Admitted From: Home Disposition:  Home   Recommendations for Outpatient Follow-up:  PCP: in 1 wk Follow up LABS/TEST:  CXR in 4 wks   Follow-up Information     Juluis Pitch, MD. Go to.   Specialty: Family Medicine Why: Schedule appt. w/Dr. Lovie Macadamia [phone] Contact information: [place] [address] [phone]                Diet recommendation: Cardiac diet  Activity: The patient is advised to gradually reintroduce usual activities, as tolerated  Discharge Condition: stable  Code Status: Full code   History of present illness: As per the H and P dictated on admission.  Hospital Course:  Michael [name] is a 89 y.o. male with medical history significant of multiple medical issues including coronary artery disease, stage III CKD, diastolic dysfunction, history of SVT, GERD, Lewy body dementia presenting with aspiration pneumonia.    History primarily from patient's wife Michael [name].  Per the wife, patient woke up to use the bathroom around 3 AM with significant weakness.  Patient has significant episodes of vomiting.  Because of the vomiting and weakness, the wife called EMS with patient having another episode of vomiting on EMS arrival.  Per the wife, patient has had overall worsening lethargy and confusion over the past 2 to 3 days.  Positive mild chills.  ED w/up:  hemodynamically stable. Requiring 15 L nonrebreather to keep O2 sats greater than 95% initially. ABG grossly stable. Creatinine 1.9. White count 6, hemoglobin 13.5. Respiratory panel of negative for COVID flu and RSV. Troponin negative x 2. Chest x-ray with severe pneumonia on the left side. Started on Rocephin, azithromycin and Flagyl in the ER.      Assessment and Plan:   # Acute respiratory failure with hypoxia due to Aspiration  Pt required 7 L with high flow nasal cannula and nonrebreather on arrival,  Supplemental oxygen inhalation was gradually weaned off, currently patient is saturating well on room air. WBC 6, BNP mildly elevated in 200s without volume overload clinically. CXR w/ severe L sided PNA. S/p rocephin, flagyl and azithromycin in the ER, Continued ceftriaxone 2 g IV daily, azithromycin 500 mg IV daily and Flagyl 500 mg IV every 12 hourly, during hospital stay.  Transition to oral antibiotics Augmentin twice daily for 5 additional days on discharge. S/p Mucinex 600 mg twice daily, continued 5 days more on discharge.  Leukocytosis resolved.  Follow-up with PCP to repeat CXR after 4 weeks for resolution of pneumonia. # Hypokalemia most likely due to diarrhea, 2/2 antibiotics. 3/5 patient is poor historian, he is having diarrhea since yesterday and no one informed me.  Potassium was repleted. Stool sample was ordered but could not be collected because diarrhea resolved.  Patient received probiotics.  Hypokalemia resolved. # Lewy body dementia: Per the wife, worsening confusion over the past few days in the setting of aspiration event.  Resumed Aricept. # Coronary artery disease involving native coronary artery of native heart without angina pectoris. No reported chest pain. Troponin negative.   # PSVT (paroxysmal supraventricular tachycardia), Heart rate stable in 80s Continue home amiodarone and Cardizem  # AKI on CKD (chronic kidney disease) stage 3, GFR 30-59 ml/min (HCC) Creatinine  1.88 --1.55 --1.15 today. Resolved S/p IVF  # Essential hypertension, Resumed losartan and Cardizem Home medications BP was fluctuating, slightly elevated, patient was advised to monitor BP at home and follow with PCP to titrate medication accordingly. # Peripheral neuropathy, continued gabapentin # Chronic pain, continued Percocet as needed home dose # Insomnia, started melatonin 5 mg nightly and trazodone 50 mg p.o. daily # Restless leg syndrome, patient is complaining of excessive leg movements. S/p Requip 0.25 mg p.o. 3 times daily, changed to 0.5 mg p.o. nightly discharge.  Follow with PCP for further management as an outpatient.  Body mass index is 28.49 kg/m.  Nutrition Interventions:     Pain control  - [place] Controlled Substance Reporting System database was not reviewed. -No new prescription given, resumed home medication. - Patient was instructed, not to drive, operate heavy machinery, perform activities at heights, swimming or participation in water activities or provide baby sitting services while on Pain, Sleep and Anxiety Medications; until his outpatient Physician has advised to do so again.  - Also recommended to not to take more than prescribed Pain, Sleep and Anxiety Medications.  Patient was seen by physical therapy, who recommended Home health, which was arranged. On the day of the discharge the patient's vitals were stable, and no other acute medical condition were reported by patient. the patient was felt safe to be discharge at Home with Home health.  Consultants: None Procedures: none  Discharge Exam: General: Appear in no distress, no Rash; Oral Mucosa Clear, moist. Cardiovascular: S1 and S2 Present, no Murmur, Respiratory: normal respiratory effort, Bilateral Air entry present and no Crackles, no wheezes Abdomen: Bowel Sound present, Soft and no tenderness, no hernia Extremities: no Pedal edema, no calf tenderness Neurology: alert and oriented to  time, place, and person affect appropriate.  Filed Weights   11/12/22 0644 11/12/22 1035  Weight: 89.8 kg 87.5 kg   Vitals:   11/17/22 0405 11/17/22 0740  BP: (!) 142/72 (!) 169/85  Pulse: 66 66  Resp: 17 16  Temp: (!) 97.4 F (36.3 C) 98.1 F (36.7 C)  SpO2: 94% 90%    DISCHARGE MEDICATION: Allergies as of 11/17/2022   No Known Allergies      Medication List     TAKE these medications    amiodarone 200 MG tablet Commonly known as: PACERONE Take 0.5 tablets (100 mg total) by mouth daily.   amoxicillin-clavulanate 875-125 MG tablet Commonly known as: AUGMENTIN Take 1 tablet by mouth 2 (two) times daily for 5 days.   camphor-menthol lotion Commonly known as: SARNA Apply 1 application topically as needed for itching (dry skin).   CENTRUM SILVER ADULT 50+ PO Take 1 tablet by mouth daily.   diltiazem 120 MG 24 hr capsule Commonly known as: DILACOR XR Take 120 mg by mouth daily.   donepezil 10 MG tablet Commonly known as: ARICEPT Take 0.5 tablets (5 mg total) by mouth at bedtime.   eszopiclone 2 MG Tabs tablet Commonly known as: LUNESTA Take 2 mg by mouth at bedtime. Take immediately before bedtime   gabapentin 300 MG capsule Commonly known as: NEURONTIN Take 600 mg by mouth at bedtime.   guaiFENesin  600 MG 12 hr tablet Commonly known as: MUCINEX Take 1 tablet (600 mg total) by mouth 2 (two) times daily for 5 days.   levothyroxine 50 MCG tablet Commonly known as: SYNTHROID Take 50 mcg by mouth daily before breakfast.   losartan 100 MG tablet Commonly known as: COZAAR Take 50 mg by mouth 2 (two) times daily.   lovastatin 20 MG tablet Commonly known as: MEVACOR Take 1 tablet (20 mg total) by mouth daily.   melatonin 5 MG Tabs Take 1 tablet (5 mg total) by mouth every evening.   omeprazole 20 MG capsule Commonly known as: PRILOSEC TAKE 1 CAPSULE BY MOUTH EVERY DAY AS NEEDED.   oxyCODONE-acetaminophen 10-325 MG tablet Commonly known as:  PERCOCET Take 1 tablet by mouth 3 (three) times daily as needed for pain.   Polyethyl Glycol-Propyl Glycol 0.4-0.3 % Soln Place 1 drop into both eyes 4 (four) times daily.   potassium chloride 10 MEQ tablet Commonly known as: KLOR-CON Take 20 mEq by mouth 2 (two) times daily.   rOPINIRole 0.5 MG tablet Commonly known as: REQUIP Take 0.5 tablets (0.25 mg total) by mouth at bedtime.   traZODone 50 MG tablet Commonly known as: DESYREL Take 1 tablet (50 mg total) by mouth at bedtime.       No Known Allergies Discharge Instructions     Call MD for:  difficulty breathing, headache or visual disturbances   Complete by: As directed    Call MD for:  extreme fatigue   Complete by: As directed    Call MD for:  persistant dizziness or light-headedness   Complete by: As directed    Call MD for:  temperature >100.4   Complete by: As directed    Diet - low sodium heart healthy   Complete by: As directed    Discharge instructions   Complete by: As directed    Follow-up with PCP in 1 week, continue to monitor BP at home and follow with PCP to titrate medications accordingly.  Repeat chest x-ray after 4 weeks for resolution of pneumonia.   Increase activity slowly   Complete by: As directed        The results of significant diagnostics from this hospitalization (including imaging, microbiology, ancillary and laboratory) are listed below for reference.    Significant Diagnostic Studies: DG Chest Port 1 View  Result Date: 11/12/2022 CLINICAL DATA:  Hypoxia EXAM: PORTABLE CHEST 1 VIEW COMPARISON:  02/19/2020 FINDINGS: Severe airspace disease on the left. Mild cardiomegaly accentuated by technique. No effusion or pneumothorax. IMPRESSION: Severe pneumonia on the left. Electronically Signed   By: Jorje Guild M.D.   On: 11/12/2022 07:07    Microbiology: Recent Results (from the past 240 hour(s))  Blood Culture (routine x 2)     Status: None   Collection Time: 11/12/22  6:47 AM    Specimen: BLOOD  Result Value Ref Range Status   Specimen Description BLOOD RIGHT ARM  Final   Special Requests   Final    BOTTLES DRAWN AEROBIC AND ANAEROBIC Blood Culture adequate volume   Culture   Final    NO GROWTH 5 DAYS Performed at [facility], [address], [place], [place] [address]    Report Status 11/17/2022 FINAL  Final  Blood Culture (routine x 2)     Status: None   Collection Time: 11/12/22  6:47 AM   Specimen: BLOOD  Result Value Ref Range Status   Specimen Description BLOOD LEFT ANTECUBITAL  Final  Special Requests   Final    BOTTLES DRAWN AEROBIC AND ANAEROBIC Blood Culture adequate volume   Culture   Final    NO GROWTH 5 DAYS Performed at [facility], [address] [place], [place] [address]    Report Status 11/17/2022 FINAL  Final  Resp panel by RT-PCR (RSV, Flu A&B, Covid) Anterior Nasal Swab     Status: None   Collection Time: 11/12/22  6:47 AM   Specimen: Anterior Nasal Swab  Result Value Ref Range Status   SARS Coronavirus 2 by RT PCR NEGATIVE NEGATIVE Final    Comment: (NOTE) SARS-CoV-2 target nucleic acids are NOT DETECTED.  The SARS-CoV-2 RNA is generally detectable in upper respiratory specimens during the acute phase of infection. The lowest concentration of SARS-CoV-2 viral copies this assay can detect is 138 copies/mL. A negative result does not preclude SARS-Cov-2 infection and should not be used as the sole basis for treatment or other patient management decisions. A negative result may occur with  improper specimen collection/handling, submission of specimen other than nasopharyngeal swab, presence of viral mutation(s) within the areas targeted by this assay, and inadequate number of viral copies(<138 copies/mL). A negative result must be combined with clinical observations, patient history, and epidemiological information. The expected result is Negative.  Fact Sheet for Patients:   EntrepreneurPulse.com.au  Fact Sheet for Healthcare Providers:  IncredibleEmployment.be  This test is no t yet approved or cleared by the [place] FDA and  has been authorized for detection and/or diagnosis of SARS-CoV-2 by FDA under an Emergency Use Authorization (EUA). This EUA will remain  in effect (meaning this test can be used) for the duration of the COVID-19 declaration under Section 564(b)(1) of the Act, 21 U.S.C.section 360bbb-3(b)(1), unless the authorization is terminated  or revoked sooner.       Influenza A by PCR NEGATIVE NEGATIVE Final   Influenza B by PCR NEGATIVE NEGATIVE Final    Comment: (NOTE) The Xpert Xpress SARS-CoV-2/FLU/RSV plus assay is intended as an aid in the diagnosis of influenza from Nasopharyngeal swab specimens and should not be used as a sole basis for treatment. Nasal washings and aspirates are unacceptable for Xpert Xpress SARS-CoV-2/FLU/RSV testing.  Fact Sheet for Patients: EntrepreneurPulse.com.au  Fact Sheet for Healthcare Providers: IncredibleEmployment.be  This test is not yet approved or cleared by the [place] FDA and has been authorized for detection and/or diagnosis of SARS-CoV-2 by FDA under an Emergency Use Authorization (EUA). This EUA will remain in effect (meaning this test can be used) for the duration of the COVID-19 declaration under Section 564(b)(1) of the Act, 21 U.S.C. section 360bbb-3(b)(1), unless the authorization is terminated or revoked.     Resp Syncytial Virus by PCR NEGATIVE NEGATIVE Final    Comment: (NOTE) Fact Sheet for Patients: EntrepreneurPulse.com.au  Fact Sheet for Healthcare Providers: IncredibleEmployment.be  This test is not yet approved or cleared by the [place] FDA and has been authorized for detection and/or diagnosis of SARS-CoV-2 by FDA under an Emergency Use  Authorization (EUA). This EUA will remain in effect (meaning this test can be used) for the duration of the COVID-19 declaration under Section 564(b)(1) of the Act, 21 U.S.C. section 360bbb-3(b)(1), unless the authorization is terminated or revoked.  Performed at [facility], [address] [place], [place] [address]   Expectorated Sputum Assessment w Gram Stain, Rflx to Resp Cult     Status: None   Collection Time: 11/12/22 11:55 AM   Specimen: Sputum  Result Value Ref Range Status   Specimen Description SPUTUM  Final   Special Requests NONE  Final   Sputum evaluation   Final    THIS SPECIMEN IS ACCEPTABLE FOR SPUTUM CULTURE Performed at [facility], [address] [place], [place] [address]    Report Status 11/12/2022 FINAL  Final  Culture, Respiratory w Gram Stain     Status: None   Collection Time: 11/12/22 11:55 AM   Specimen: SPU  Result Value Ref Range Status   Specimen Description   Final    SPUTUM Performed at [facility], [address], [place], [place] [address]    Special Requests   Final    NONE Reflexed from [id number] Performed at [facility], [address] [place], [place] [address]    Gram Stain   Final    ABUNDANT WBC PRESENT, PREDOMINANTLY PMN FEW GRAM POSITIVE COCCI IN PAIRS IN CLUSTERS FEW GRAM NEGATIVE RODS RARE SQUAMOUS EPITHELIAL CELLS PRESENT    Culture   Final    FEW Normal respiratory flora-no Staph aureus or Pseudomonas seen Performed at [facility], [address], [place], [place] [address]    Report Status 11/15/2022 FINAL  Final     Labs: CBC: Recent Labs  Lab 11/12/22 0647 11/13/22 0446 11/14/22 0425 11/15/22 0537 11/16/22 0329 11/17/22 0453  WBC 6.0 9.4 8.6 11.5* 6.8 5.9  NEUTROABS 4.6  --   --   --   --   --   HGB 13.5 10.6* 9.5* 10.7* 10.5* 10.5*  HCT 42.0 32.4* 29.3* 33.0* 32.0* 31.3*  MCV 99.5 98.5 98.7 97.9 97.6 95.1  PLT 302 188 181 195 187 [id number]   Basic Metabolic  Panel: Recent Labs  Lab 11/12/22 0647 11/13/22 0443 11/13/22 0446 11/14/22 0425 11/15/22 0537 11/16/22 0329 11/17/22 0453  NA 139  --  143 140 137 140 140  K 3.7  --  4.0 3.5 2.7* 3.0* 3.9  CL 104  --  109 108 104 106 108  CO2 24  --  '27 23 28 26 25  '$ GLUCOSE 120*  --  123* 91 85 90 84  BUN 41*  --  36* 42* 30* 19 21  CREATININE 1.88*  --  1.55* 1.49* 1.14 1.18 1.15  CALCIUM 9.2  --  8.6* 8.2* 8.5* 8.1* 8.3*  MG 2.4  --   --  2.1 2.0 2.0  --   PHOS  --  3.9  --  3.1 2.8 3.0  --    Liver Function Tests: Recent Labs  Lab 11/12/22 0647 11/13/22 0446  AST 39 30  ALT 21 17  ALKPHOS 58 33*  BILITOT 0.7 0.8  PROT 6.7 5.6*  ALBUMIN 3.9 3.1*   No results for input(s): "LIPASE", "AMYLASE" in the last 168 hours. No results for input(s): "AMMONIA" in the last 168 hours. Cardiac Enzymes: No results for input(s): "CKTOTAL", "CKMB", "CKMBINDEX", "TROPONINI" in the last 168 hours. BNP (last 3 results) Recent Labs    11/12/22 0647  BNP 210.1*   CBG: No results for input(s): "GLUCAP" in the last 168 hours.  Time spent: 35 minutes  Signed:  Val Riles  [facility] 11/17/2022 11:06 AM

## 2022-11-17 NOTE — Progress Notes (Signed)
Mobility Specialist - Progress Note     11/17/22 0900  Mobility  Activity Ambulated with assistance in hallway;Stood at bedside  Level of Assistance Modified independent, requires aide device or extra time  Assistive Device Front wheel walker  Distance Ambulated (ft) 160 ft  Activity Response Tolerated well  Mobility Referral Yes  $Mobility charge 1 Mobility   Pt resting in recliner on RA upon entry. Pt STS and ambulates to hallway around [place] for one lap. Pt returned to recliner and left with needs in reach.   Loma Sender Mobility Specialist 11/17/22, 9:08 AM

## 2023-01-11 ENCOUNTER — Other Ambulatory Visit: Payer: Self-pay | Admitting: Optometry

## 2023-01-11 DIAGNOSIS — H349 Unspecified retinal vascular occlusion: Secondary | ICD-10-CM

## 2023-01-11 DIAGNOSIS — Z0189 Encounter for other specified special examinations: Secondary | ICD-10-CM

## 2023-01-19 ENCOUNTER — Ambulatory Visit
Admission: RE | Admit: 2023-01-19 | Discharge: 2023-01-19 | Disposition: A | Discharge status: Home / Self Care | Payer: No Typology Code available for payment source | Source: Ambulatory Visit | Attending: Optometry | Admitting: Optometry

## 2023-01-19 DIAGNOSIS — H349 Unspecified retinal vascular occlusion: Secondary | ICD-10-CM | POA: Insufficient documentation

## 2023-01-19 DIAGNOSIS — Z0189 Encounter for other specified special examinations: Secondary | ICD-10-CM | POA: Insufficient documentation

## 2023-04-29 ENCOUNTER — Other Ambulatory Visit: Payer: Self-pay | Admitting: Cardiovascular Disease

## 2023-05-24 ENCOUNTER — Emergency Department: Payer: No Typology Code available for payment source

## 2023-05-24 ENCOUNTER — Inpatient Hospital Stay
Admission: EM | Admit: 2023-05-24 | Discharge: 2023-05-27 | DRG: 871 | Disposition: A | Discharge status: Home Health | Payer: No Typology Code available for payment source | Attending: Internal Medicine | Admitting: Internal Medicine

## 2023-05-24 DIAGNOSIS — Z85038 Personal history of other malignant neoplasm of large intestine: Secondary | ICD-10-CM

## 2023-05-24 DIAGNOSIS — Z961 Presence of intraocular lens: Secondary | ICD-10-CM | POA: Diagnosis present

## 2023-05-24 DIAGNOSIS — Z8719 Personal history of other diseases of the digestive system: Secondary | ICD-10-CM

## 2023-05-24 DIAGNOSIS — R6520 Severe sepsis without septic shock: Secondary | ICD-10-CM | POA: Diagnosis present

## 2023-05-24 DIAGNOSIS — I5032 Chronic diastolic (congestive) heart failure: Secondary | ICD-10-CM | POA: Diagnosis present

## 2023-05-24 DIAGNOSIS — Z9012 Acquired absence of left breast and nipple: Secondary | ICD-10-CM

## 2023-05-24 DIAGNOSIS — J690 Pneumonitis due to inhalation of food and vomit: Secondary | ICD-10-CM | POA: Diagnosis present

## 2023-05-24 DIAGNOSIS — F0280 Dementia in other diseases classified elsewhere without behavioral disturbance: Secondary | ICD-10-CM | POA: Diagnosis present

## 2023-05-24 DIAGNOSIS — G9340 Encephalopathy, unspecified: Secondary | ICD-10-CM

## 2023-05-24 DIAGNOSIS — G9341 Metabolic encephalopathy: Secondary | ICD-10-CM | POA: Diagnosis present

## 2023-05-24 DIAGNOSIS — E785 Hyperlipidemia, unspecified: Secondary | ICD-10-CM | POA: Diagnosis present

## 2023-05-24 DIAGNOSIS — Z87442 Personal history of urinary calculi: Secondary | ICD-10-CM

## 2023-05-24 DIAGNOSIS — I130 Hypertensive heart and chronic kidney disease with heart failure and stage 1 through stage 4 chronic kidney disease, or unspecified chronic kidney disease: Secondary | ICD-10-CM | POA: Diagnosis present

## 2023-05-24 DIAGNOSIS — Z8701 Personal history of pneumonia (recurrent): Secondary | ICD-10-CM

## 2023-05-24 DIAGNOSIS — U071 COVID-19: Secondary | ICD-10-CM | POA: Diagnosis not present

## 2023-05-24 DIAGNOSIS — Z9841 Cataract extraction status, right eye: Secondary | ICD-10-CM

## 2023-05-24 DIAGNOSIS — K219 Gastro-esophageal reflux disease without esophagitis: Secondary | ICD-10-CM | POA: Diagnosis present

## 2023-05-24 DIAGNOSIS — Z9049 Acquired absence of other specified parts of digestive tract: Secondary | ICD-10-CM

## 2023-05-24 DIAGNOSIS — Z9181 History of falling: Secondary | ICD-10-CM

## 2023-05-24 DIAGNOSIS — I700 Atherosclerosis of aorta: Secondary | ICD-10-CM | POA: Diagnosis present

## 2023-05-24 DIAGNOSIS — Z9842 Cataract extraction status, left eye: Secondary | ICD-10-CM

## 2023-05-24 DIAGNOSIS — Z96641 Presence of right artificial hip joint: Secondary | ICD-10-CM | POA: Diagnosis present

## 2023-05-24 DIAGNOSIS — Z9889 Other specified postprocedural states: Secondary | ICD-10-CM

## 2023-05-24 DIAGNOSIS — J1282 Pneumonia due to coronavirus disease 2019: Secondary | ICD-10-CM | POA: Diagnosis present

## 2023-05-24 DIAGNOSIS — Z87898 Personal history of other specified conditions: Secondary | ICD-10-CM

## 2023-05-24 DIAGNOSIS — R410 Disorientation, unspecified: Secondary | ICD-10-CM | POA: Diagnosis not present

## 2023-05-24 DIAGNOSIS — N189 Chronic kidney disease, unspecified: Secondary | ICD-10-CM | POA: Diagnosis present

## 2023-05-24 DIAGNOSIS — I4719 Other supraventricular tachycardia: Secondary | ICD-10-CM | POA: Diagnosis present

## 2023-05-24 DIAGNOSIS — E860 Dehydration: Secondary | ICD-10-CM | POA: Diagnosis present

## 2023-05-24 DIAGNOSIS — I878 Other specified disorders of veins: Secondary | ICD-10-CM | POA: Diagnosis present

## 2023-05-24 DIAGNOSIS — M549 Dorsalgia, unspecified: Secondary | ICD-10-CM | POA: Diagnosis present

## 2023-05-24 DIAGNOSIS — A4189 Other specified sepsis: Secondary | ICD-10-CM | POA: Diagnosis not present

## 2023-05-24 DIAGNOSIS — Z8249 Family history of ischemic heart disease and other diseases of the circulatory system: Secondary | ICD-10-CM

## 2023-05-24 DIAGNOSIS — Z862 Personal history of diseases of the blood and blood-forming organs and certain disorders involving the immune mechanism: Secondary | ICD-10-CM

## 2023-05-24 DIAGNOSIS — Z79899 Other long term (current) drug therapy: Secondary | ICD-10-CM

## 2023-05-24 DIAGNOSIS — I2510 Atherosclerotic heart disease of native coronary artery without angina pectoris: Secondary | ICD-10-CM | POA: Diagnosis present

## 2023-05-24 DIAGNOSIS — G8929 Other chronic pain: Secondary | ICD-10-CM | POA: Diagnosis present

## 2023-05-24 DIAGNOSIS — E876 Hypokalemia: Secondary | ICD-10-CM | POA: Diagnosis present

## 2023-05-24 DIAGNOSIS — G3183 Dementia with Lewy bodies: Secondary | ICD-10-CM | POA: Diagnosis present

## 2023-05-24 DIAGNOSIS — N400 Enlarged prostate without lower urinary tract symptoms: Secondary | ICD-10-CM | POA: Diagnosis present

## 2023-05-24 DIAGNOSIS — Z96652 Presence of left artificial knee joint: Secondary | ICD-10-CM | POA: Diagnosis present

## 2023-05-24 DIAGNOSIS — N179 Acute kidney failure, unspecified: Secondary | ICD-10-CM | POA: Diagnosis present

## 2023-05-24 DIAGNOSIS — I471 Supraventricular tachycardia, unspecified: Secondary | ICD-10-CM | POA: Diagnosis present

## 2023-05-24 DIAGNOSIS — H9190 Unspecified hearing loss, unspecified ear: Secondary | ICD-10-CM | POA: Diagnosis present

## 2023-05-24 DIAGNOSIS — I440 Atrioventricular block, first degree: Secondary | ICD-10-CM | POA: Diagnosis present

## 2023-05-24 DIAGNOSIS — Z79890 Hormone replacement therapy (postmenopausal): Secondary | ICD-10-CM

## 2023-05-24 DIAGNOSIS — Z8261 Family history of arthritis: Secondary | ICD-10-CM

## 2023-05-24 DIAGNOSIS — A419 Sepsis, unspecified organism: Secondary | ICD-10-CM | POA: Diagnosis not present

## 2023-05-24 DIAGNOSIS — J189 Pneumonia, unspecified organism: Secondary | ICD-10-CM | POA: Diagnosis not present

## 2023-05-24 DIAGNOSIS — I10 Essential (primary) hypertension: Secondary | ICD-10-CM | POA: Diagnosis present

## 2023-05-24 DIAGNOSIS — M1990 Unspecified osteoarthritis, unspecified site: Secondary | ICD-10-CM | POA: Diagnosis present

## 2023-05-24 DIAGNOSIS — N1831 Chronic kidney disease, stage 3a: Secondary | ICD-10-CM | POA: Diagnosis present

## 2023-05-24 DIAGNOSIS — Z974 Presence of external hearing-aid: Secondary | ICD-10-CM

## 2023-05-24 LAB — COMPREHENSIVE METABOLIC PANEL
ALT: 23 U/L (ref 0–44)
AST: 35 U/L (ref 15–41)
Albumin: 3.3 g/dL — ABNORMAL LOW (ref 3.5–5.0)
Alkaline Phosphatase: 38 U/L (ref 38–126)
Anion gap: 11 (ref 5–15)
BUN: 39 mg/dL — ABNORMAL HIGH (ref 8–23)
CO2: 23 mmol/L (ref 22–32)
Calcium: 8.4 mg/dL — ABNORMAL LOW (ref 8.9–10.3)
Chloride: 100 mmol/L (ref 98–111)
Creatinine, Ser: 1.63 mg/dL — ABNORMAL HIGH (ref 0.61–1.24)
GFR, Estimated: 40 mL/min — ABNORMAL LOW (ref >60)
Glucose, Bld: 76 mg/dL (ref 70–99)
Potassium: 3.3 mmol/L — ABNORMAL LOW (ref 3.5–5.1)
Sodium: 134 mmol/L — ABNORMAL LOW (ref 135–145)
Total Bilirubin: 0.8 mg/dL (ref 0.3–1.2)
Total Protein: 6 g/dL — ABNORMAL LOW (ref 6.5–8.1)

## 2023-05-24 LAB — CBC WITH DIFFERENTIAL/PLATELET
Abs Immature Granulocytes: 0.02 10*3/uL (ref 0.00–0.07)
Basophils Absolute: 0 10*3/uL (ref 0.0–0.1)
Basophils Relative: 0 %
Eosinophils Absolute: 0.2 10*3/uL (ref 0.0–0.5)
Eosinophils Relative: 5 %
HCT: 32.2 % — ABNORMAL LOW (ref 39.0–52.0)
Hemoglobin: 10.5 g/dL — ABNORMAL LOW (ref 13.0–17.0)
Immature Granulocytes: 0 %
Lymphocytes Relative: 6 %
Lymphs Abs: 0.3 10*3/uL — ABNORMAL LOW (ref 0.7–4.0)
MCH: 33.1 pg (ref 26.0–34.0)
MCHC: 32.6 g/dL (ref 30.0–36.0)
MCV: 101.6 fL — ABNORMAL HIGH (ref 80.0–100.0)
Monocytes Absolute: 0.2 10*3/uL (ref 0.1–1.0)
Monocytes Relative: 5 %
Neutro Abs: 3.8 10*3/uL (ref 1.7–7.7)
Neutrophils Relative %: 84 %
Platelets: 165 10*3/uL (ref 150–400)
RBC: 3.17 MIL/uL — ABNORMAL LOW (ref 4.22–5.81)
RDW: 13.3 % (ref 11.5–15.5)
WBC: 4.6 10*3/uL (ref 4.0–10.5)
nRBC: 0 % (ref 0.0–0.2)

## 2023-05-24 LAB — LACTIC ACID, PLASMA
Lactic Acid, Venous: 1.5 mmol/L (ref 0.5–1.9)
Lactic Acid, Venous: 1.2 mmol/L (ref 0.5–1.9)

## 2023-05-24 LAB — SARS CORONAVIRUS 2 BY RT PCR: SARS Coronavirus 2 by RT PCR: POSITIVE — AB

## 2023-05-24 LAB — PROCALCITONIN: Procalcitonin: 2.15 ng/mL

## 2023-05-24 LAB — PROTIME-INR
INR: 1.2 (ref 0.8–1.2)
Prothrombin Time: 14.9 s (ref 11.4–15.2)

## 2023-05-24 MED ORDER — SODIUM CHLORIDE 0.9 % IV SOLN
100.0000 mg | Freq: Every day | INTRAVENOUS | Status: DC
  Administered 2023-05-25: 100 mg via INTRAVENOUS
  Filled 2023-05-24 (×2): qty 20

## 2023-05-24 MED ORDER — SODIUM CHLORIDE 0.9 % IV SOLN
100.0000 mg | Freq: Two times a day (BID) | INTRAVENOUS | Status: DC
  Administered 2023-05-25 – 2023-05-26 (×3): 100 mg via INTRAVENOUS
  Filled 2023-05-24 (×3): qty 100

## 2023-05-24 MED ORDER — SODIUM CHLORIDE 0.9 % IV SOLN
200.0000 mg | Freq: Once | INTRAVENOUS | Status: AC
  Administered 2023-05-24: 200 mg via INTRAVENOUS
  Filled 2023-05-24: qty 40

## 2023-05-24 MED ORDER — POTASSIUM CHLORIDE CRYS ER 20 MEQ PO TBCR
20.0000 meq | EXTENDED_RELEASE_TABLET | ORAL | Status: AC
  Administered 2023-05-24 (×2): 20 meq via ORAL
  Filled 2023-05-24 (×2): qty 1

## 2023-05-24 MED ORDER — ONDANSETRON HCL 4 MG/2ML IJ SOLN: 4.0000 mg | Freq: Four times a day (QID) | INTRAMUSCULAR | Status: DC | PRN

## 2023-05-24 MED ORDER — OXYCODONE-ACETAMINOPHEN 5-325 MG PO TABS
1.0000 | ORAL_TABLET | Freq: Three times a day (TID) | ORAL | Status: DC | PRN
  Administered 2023-05-25: 1 via ORAL
  Filled 2023-05-24: qty 1

## 2023-05-24 MED ORDER — ACETAMINOPHEN 325 MG PO TABS
650.0000 mg | ORAL_TABLET | Freq: Four times a day (QID) | ORAL | Status: DC | PRN
  Administered 2023-05-24: 650 mg via ORAL
  Filled 2023-05-24: qty 2

## 2023-05-24 MED ORDER — ONDANSETRON HCL 4 MG PO TABS: 4.0000 mg | ORAL_TABLET | Freq: Four times a day (QID) | ORAL | Status: DC | PRN

## 2023-05-24 MED ORDER — ROPINIROLE HCL 0.25 MG PO TABS
0.2500 mg | ORAL_TABLET | Freq: Every day | ORAL | Status: DC
  Administered 2023-05-24 – 2023-05-25 (×2): 0.25 mg via ORAL
  Filled 2023-05-24 (×2): qty 1

## 2023-05-24 MED ORDER — SODIUM CHLORIDE 0.9 % IV SOLN
500.0000 mg | INTRAVENOUS | Status: DC
  Administered 2023-05-24: 500 mg via INTRAVENOUS
  Filled 2023-05-24: qty 5

## 2023-05-24 MED ORDER — OXYCODONE-ACETAMINOPHEN 10-325 MG PO TABS: 1.0000 | ORAL_TABLET | Freq: Three times a day (TID) | ORAL | Status: DC | PRN

## 2023-05-24 MED ORDER — AMIODARONE HCL 200 MG PO TABS
100.0000 mg | ORAL_TABLET | Freq: Every day | ORAL | Status: DC
  Administered 2023-05-25 – 2023-05-27 (×3): 100 mg via ORAL
  Filled 2023-05-24 (×3): qty 1

## 2023-05-24 MED ORDER — ACETAMINOPHEN 10 MG/ML IV SOLN
1000.0000 mg | Freq: Once | INTRAVENOUS | Status: AC
  Administered 2023-05-24: 1000 mg via INTRAVENOUS
  Filled 2023-05-24: qty 100

## 2023-05-24 MED ORDER — SODIUM CHLORIDE 0.9% FLUSH
3.0000 mL | Freq: Two times a day (BID) | INTRAVENOUS | Status: DC
  Administered 2023-05-25 – 2023-05-27 (×3): 3 mL via INTRAVENOUS

## 2023-05-24 MED ORDER — POLYVINYL ALCOHOL 1.4 % OP SOLN
1.0000 [drp] | Freq: Four times a day (QID) | OPHTHALMIC | Status: DC
  Administered 2023-05-24 – 2023-05-27 (×8): 1 [drp] via OPHTHALMIC
  Filled 2023-05-24: qty 15

## 2023-05-24 MED ORDER — LEVOTHYROXINE SODIUM 50 MCG PO TABS
50.0000 ug | ORAL_TABLET | Freq: Every day | ORAL | Status: DC
  Administered 2023-05-25 – 2023-05-27 (×3): 50 ug via ORAL
  Filled 2023-05-24 (×3): qty 1

## 2023-05-24 MED ORDER — ENOXAPARIN SODIUM 40 MG/0.4ML IJ SOSY
40.0000 mg | PREFILLED_SYRINGE | INTRAMUSCULAR | Status: DC
  Administered 2023-05-24 – 2023-05-26 (×3): 40 mg via SUBCUTANEOUS
  Filled 2023-05-24 (×3): qty 0.4

## 2023-05-24 MED ORDER — SODIUM CHLORIDE 0.9 % IV SOLN
2.0000 g | INTRAVENOUS | Status: DC
  Administered 2023-05-24 – 2023-05-25 (×2): 2 g via INTRAVENOUS
  Filled 2023-05-24 (×3): qty 20

## 2023-05-24 MED ORDER — ZOLPIDEM TARTRATE 5 MG PO TABS
5.0000 mg | ORAL_TABLET | Freq: Every evening | ORAL | Status: DC | PRN
  Administered 2023-05-24 – 2023-05-25 (×2): 5 mg via ORAL
  Filled 2023-05-24 (×2): qty 1

## 2023-05-24 MED ORDER — LACTATED RINGERS IV SOLN
150.0000 mL/h | INTRAVENOUS | Status: AC
  Administered 2023-05-24 – 2023-05-25 (×2): 150 mL/h via INTRAVENOUS

## 2023-05-24 MED ORDER — OXYCODONE HCL 5 MG PO TABS
5.0000 mg | ORAL_TABLET | Freq: Three times a day (TID) | ORAL | Status: DC | PRN
  Administered 2023-05-24 – 2023-05-25 (×2): 5 mg via ORAL
  Filled 2023-05-24 (×2): qty 1

## 2023-05-24 MED ORDER — DILTIAZEM HCL ER 120 MG PO TB24
120.0000 mg | ORAL_TABLET | Freq: Every day | ORAL | Status: DC
  Administered 2023-05-25 – 2023-05-27 (×3): 120 mg via ORAL
  Filled 2023-05-24 (×6): qty 1

## 2023-05-24 MED ORDER — LACTATED RINGERS IV BOLUS
1000.0000 mL | Freq: Once | INTRAVENOUS | Status: AC
  Administered 2023-05-24: 1000 mL via INTRAVENOUS

## 2023-05-24 MED ORDER — GABAPENTIN 300 MG PO CAPS
600.0000 mg | ORAL_CAPSULE | Freq: Every day | ORAL | Status: DC
  Administered 2023-05-24 – 2023-05-26 (×3): 600 mg via ORAL
  Filled 2023-05-24 (×3): qty 2

## 2023-05-24 MED ORDER — ACETAMINOPHEN 650 MG RE SUPP: 650.0000 mg | Freq: Four times a day (QID) | RECTAL | Status: DC | PRN

## 2023-05-24 MED ORDER — POLYETHYLENE GLYCOL 3350 17 G PO PACK: 17.0000 g | PACK | Freq: Every day | ORAL | Status: DC | PRN

## 2023-05-24 NOTE — Assessment & Plan Note (Signed)
In the setting of underlying infection and history of Lewy body dementia.  - Delirium precautions

## 2023-05-24 NOTE — Assessment & Plan Note (Addendum)
Patient has been symptomatic for approximately 2-3 days with productive cough and fever. I strongly suspect there is an aspect of COVID-19 pneumonia as well. Given high risk for complications, will discuss with pharmacy antiviral therapy.  - Tessalon Perles as needed for cough - Remdesivir infusion per updated [facility] COVID-19 guidelines

## 2023-05-24 NOTE — Assessment & Plan Note (Signed)
No current c/o CP

## 2023-05-24 NOTE — ED Provider Notes (Signed)
[facility] Provider Note    Event Date/Time   First MD Initiated Contact with Patient 05/24/23 1415     (approximate)   History   Chief Complaint: Altered Mental Status   HPI  Michael [name] is a 89 y.o. male with a history of CKD, hypertension, colon cancer, dementia was brought to the ED due to worsening confusion for the past 2 days, fever of 102 at home today.  Also has reportedly had productive cough at home.  Has dementia, but spouse at bedside says he is more confused than normal.  No vomiting or diarrhea but he has had decreased oral intake.     Physical Exam   Triage Vital Signs: ED Triage Vitals [05/24/23 1357]  Encounter Vitals Group     BP (!) 103/47     Systolic BP Percentile      Diastolic BP Percentile      Pulse Rate 78     Resp (!) 24     Temp 99.7 F (37.6 C)     Temp Source Oral     SpO2 94 %     Weight      Height      Head Circumference      Peak Flow      Pain Score      Pain Loc      Pain Education      Exclude from Growth Chart     Most recent vital signs: Vitals:   05/24/23 1455 05/24/23 1505  BP: (!) 106/53 (!) 122/53  Pulse: 78 68  Resp:  18  Temp: (!) 102 F (38.9 C)   SpO2: (!) 75% 93%    General: Awake, no distress.  CV:  Good peripheral perfusion.  Regular rate and rhythm Resp:  Normal effort.  Crackles and diminished air movement in the right lower lung.  No wheezing. Abd:  No distention.  Soft nontender Other:  Trace pitting edema bilateral lower extremities.  Symmetric calf circumference.  No inflammatory soft tissue changes.   ED Results / Procedures / Treatments   Labs (all labs ordered are listed, but only abnormal results are displayed) Labs Reviewed  COMPREHENSIVE METABOLIC PANEL - Abnormal; Notable for the following components:      Result Value   Sodium 134 (*)    Potassium 3.3 (*)    BUN 39 (*)    Creatinine, Ser 1.63 (*)    Calcium 8.4 (*)    Total Protein 6.0 (*)    Albumin  3.3 (*)    GFR, Estimated 40 (*)    All other components within normal limits  CBC WITH DIFFERENTIAL/PLATELET - Abnormal; Notable for the following components:   RBC 3.17 (*)    Hemoglobin 10.5 (*)    HCT 32.2 (*)    MCV 101.6 (*)    Lymphs Abs 0.3 (*)    All other components within normal limits  CULTURE, BLOOD (ROUTINE X 2)  CULTURE, BLOOD (ROUTINE X 2)  SARS CORONAVIRUS 2 BY RT PCR  LACTIC ACID, PLASMA  PROTIME-INR  LACTIC ACID, PLASMA  URINALYSIS, W/ REFLEX TO CULTURE (INFECTION SUSPECTED)  PROCALCITONIN     EKG    RADIOLOGY Chest xray interpreted by me, shows opacity in right lung c/w pneumonia. Radiology report reviewed.    PROCEDURES:  Procedures   MEDICATIONS ORDERED IN ED: Medications  cefTRIAXone (ROCEPHIN) 2 g in sodium chloride 0.9 % 100 mL IVPB (0 g Intravenous Stopped 05/24/23 1504)  azithromycin (  ZITHROMAX) 500 mg in sodium chloride 0.9 % 250 mL IVPB (500 mg Intravenous New Bag/Given 05/24/23 1505)     IMPRESSION / MDM / ASSESSMENT AND PLAN / ED COURSE  I reviewed the triage vital signs and the nursing notes.  DDx: Pneumonia, COVID, pulmonary edema, pleural effusion, pneumothorax, AKI, electrolyte abnormality  Patient's presentation is most consistent with acute presentation with potential threat to life or bodily function.  Patient presents with fever, increased confusion, malaise as well as symptoms and exam concerning for pneumonia.  Will check chest x-ray and labs.  Due to suspected sepsis will start Rocephin and azithromycin pending workup.   Clinical Course as of 05/24/23 1532  Wed May 24, 2023  1528 DG Chest [place] 1 View Right upper lobe pneumonia present [DW]    Clinical Course User Index [DW] Janith Lima, MD     FINAL CLINICAL IMPRESSION(S) / ED DIAGNOSES   Final diagnoses:  Confusion  Community acquired pneumonia of right upper lobe of lung     Rx / DC Orders   ED Discharge Orders     None        Note:  This  document was prepared using Dragon voice recognition software and may include unintentional dictation errors.   Sharman Cheek, MD 05/24/23 [phone]

## 2023-05-24 NOTE — H&P (Signed)
History and Physical    Patient: Michael [name] SWF:[id number] DOB: 09-Mar-1932 DOA: 05/24/2023 DOS: the patient was seen and examined on 05/24/2023 PCP: Dorothey Baseman, MD  Patient coming from: Home  Chief Complaint:  Chief Complaint  Patient presents with   Altered Mental Status   HPI: Michael [name] is a 89 y.o. male with medical history significant of Lewy body dementia, hypertension, hyperlipidemia, HFpEF with last EF above 55%, CKD stage III, CAD, diverticulosis, colon cancer s/p colectomy who presents to the ED due to altered mental status.  History limited due to patient's altered mental status and no family at bedside.  Per chart review, patient has been experiencing a productive cough and worsening confusion for 2 days.  He had a fever at home of 102.  Patient's wife states he is usually more oriented at baseline than he has been. She denied noticing any nausea, vomiting or diarrhea but seems that his oral intake has been decreased.   ED course: On arrival to the ED, patient's blood pressure was on the lower end of normal at 103/47 with heart rate of 78.  He was saturating at 94% on room air.  He was afebrile at 99.7 but subsequently developed a fever of 102.  Initial workup notable for WBC of 4.6, hemoglobin of 10.5, potassium 3.4, creatinine 1.63 with BUN of 39, and GFR 40.  Lactic acid 1.5.  Procalcitonin 2.15.  COVID-19 PCR positive. Chest x-ray was obtained that demonstrated a right upper lobe opacity concerning for pneumonia.  Patient given azithromycin and ceftriaxone with IV fluids.  TRH contacted for admission.  Review of Systems: unable to review all systems due to the inability of the patient to answer questions.  Past Medical History:  Diagnosis Date   Anemia    Aortic atherosclerosis (HCC)    Arthritis    Aspiration pneumonia (HCC) 2020   BPH (benign prostatic hypertrophy)    CAD (coronary artery disease) 05/23/2016   a.) CT 05/23/2016: extensive 3v CAD.    Cellulitis of face 12/21/2019   Chronic back pain    CKD (chronic kidney disease), stage III (HCC)    Colon cancer (HCC) 1988   Surgery alone, no history of chemo   Diastolic dysfunction 05/23/2014   a.) TTE 05/23/2014; EF 63%; G1DD. b.) TTE 10/27/2014: EF 61%, mild LA dilation; G2DD. c.) TTE 04/10/2020: EF >55%. GLS -20.3%. Triv panvalvular regurgitation; no stenosis.   Diverticulosis    Essential hypertension    First degree AV block    GERD (gastroesophageal reflux disease)    H/O calcium pyrophosphate deposition disease (CPPD)    Hiatal hernia 01/06/2015   History of kidney stones    HLD (hyperlipidemia)    HOH (hard of hearing)    bilateral hearing aids   Hypokalemia    Impotence of organic origin    Incisional hernia 10/2021   Lewy body dementia (HCC)    Peritonsillar abscess determined by examination 05/15/2019   S/p abx, I&D by ENT 05/2019   PSVT (paroxysmal supraventricular tachycardia)    a. 04/2014 Event Monitor: Freq bouts of SVT with rates to 180-->seen by EP with recommendation for RFCA, pt deferred;  b. 05/2014 Echo: EF nl, mild conc LVH, no rwma, Gr1 DD, mildMR/TR.   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   Rotator cuff rupture    Right; s/p repair   Unspecified hearing loss    Ventral hernia 1988   Past Surgical History:  Procedure Laterality Date   CATARACT EXTRACTION W/  INTRAOCULAR LENS IMPLANT Bilateral    COLONOSCOPY     HEMICOLOECTOMY W/ ANASTOMOSIS  1988   Michael [name]   INGUINAL HERNIA REPAIR Right 1963   INSERTION OF MESH N/A 11/01/2021   Procedure: INSERTION OF MESH;  Surgeon: Carolan Shiver, MD;  Location: [facility] ORS;  Service: General;  Laterality: N/A;   MASTECTOMY, PARTIAL Left 02/07/2013   LEFT SUBCUTANEOUS MASTECTOMY; Wilmon Arms. Corliss Skains, MD   ROTATOR CUFF REPAIR Right 2000    [facility]   TOTAL HIP ARTHROPLASTY Right 03/24/2020   Procedure: TOTAL HIP ARTHROPLASTY ANTERIOR APPROACH;  Surgeon: Durene Romans, MD;  Location: [facility];  Service:  Orthopedics;  Laterality: Right;  70 mins   TOTAL KNEE ARTHROPLASTY  03/05/2012   Procedure: TOTAL KNEE ARTHROPLASTY;  Surgeon: Loanne Drilling, MD;  Location: [facility];  Service: Orthopedics;  Laterality: Left;   UMBILICAL HERNIA REPAIR  1988   [facility], x 4   VEIN LIGATION AND STRIPPING Right    XI ROBOTIC ASSISTED VENTRAL HERNIA N/A 11/01/2021   Procedure: XI ROBOTIC ASSISTED VENTRAL HERNIA;  Surgeon: Carolan Shiver, MD;  Location: [facility] ORS;  Service: General;  Laterality: N/A;   Social History:  reports that he has never smoked. He has never used smokeless tobacco. He reports current alcohol use of about 1.0 standard drink of alcohol per week. He reports that he does not use drugs.  No Known Allergies  Family History  Problem Relation Age of Onset   Osteoarthritis Father    Congestive Heart Failure Mother     Prior to Admission medications   Medication Sig Start Date End Date Taking? Authorizing Provider  amiodarone (PACERONE) 200 MG tablet Take 0.5 tablets (100 mg total) by mouth daily. PLEASE CALL [phone] TO SCHEDULE YEARLY APPOINTMENT. THANK YOU. 05/01/23   Dunn, Raymon Mutton, PA-C  camphor-menthol [facility]) lotion Apply 1 application topically as needed for itching (dry skin). 11/12/20   [provider]  diltiazem (DILACOR XR) 120 MG 24 hr capsule Take 120 mg by mouth daily.    [provider]  donepezil (ARICEPT) 10 MG tablet Take 0.5 tablets (5 mg total) by mouth at bedtime. 05/30/22   Lomax, [name], NP  eszopiclone (LUNESTA) 2 MG TABS tablet Take 2 mg by mouth at bedtime. Take immediately before bedtime    [provider]  gabapentin (NEURONTIN) 300 MG capsule Take 600 mg by mouth at bedtime.    [provider]  levothyroxine (SYNTHROID) 50 MCG tablet Take 50 mcg by mouth daily before breakfast.    [provider]  losartan (COZAAR) 100 MG tablet Take 50 mg by mouth 2 (two) times daily.    [provider]   lovastatin (MEVACOR) 20 MG tablet Take 1 tablet (20 mg total) by mouth daily. 09/21/21   Antonieta Iba, MD  Multiple Vitamins-Minerals (CENTRUM SILVER ADULT 50+ PO) Take 1 tablet by mouth daily.    [provider]  omeprazole (PRILOSEC) 20 MG capsule TAKE 1 CAPSULE BY MOUTH EVERY DAY AS NEEDED. 11/07/18   Copland, Karleen Hampshire, MD  oxyCODONE-acetaminophen (PERCOCET) 10-325 MG tablet Take 1 tablet by mouth 3 (three) times daily as needed for pain.    [provider]  Polyethyl Glycol-Propyl Glycol 0.4-0.3 % SOLN Place 1 drop into both eyes 4 (four) times daily.    [provider]  potassium chloride (KLOR-CON) 10 MEQ tablet Take 20 mEq by mouth 2 (two) times daily.    [provider]  rOPINIRole (REQUIP) 0.5 MG tablet Take  0.5 tablets (0.25 mg total) by mouth at bedtime. 11/17/22 12/17/22  Gillis Santa, MD  traZODone (DESYREL) 50 MG tablet Take 1 tablet (50 mg total) by mouth at bedtime. 11/17/22 12/17/22  Gillis Santa, MD    Physical Exam: Vitals:   05/24/23 1505 05/24/23 1608 05/24/23 1630 05/24/23 1700  BP: (!) 122/53 104/78 108/85 (!) 123/59  Pulse: 68 70 70 72  Resp: 18  (!) 22 19  Temp:  (!) 101.5 F (38.6 C)    TempSrc:  Oral    SpO2: 93% 91% (!) 88% 96%   Physical Exam Vitals and nursing note reviewed.  Constitutional:      Appearance: He is normal weight. He is ill-appearing.  HENT:     Head: Normocephalic and atraumatic.     Mouth/Throat:     Mouth: Mucous membranes are dry.     Pharynx: Oropharynx is clear.  Eyes:     Conjunctiva/sclera: Conjunctivae normal.     Pupils: Pupils are equal, round, and reactive to light.  Cardiovascular:     Rate and Rhythm: Normal rate and regular rhythm.     Heart sounds: No murmur heard. Pulmonary:     Effort: Pulmonary effort is normal. No tachypnea or accessory muscle usage.     Breath sounds: Rhonchi (Diffusely on the right) and rales (minimal left basilar) present. No decreased breath sounds or wheezing.   Musculoskeletal:     Comments: Trace bilateral pitting edema with chronic venous stasis changes  Skin:    General: Skin is warm and dry.  Neurological:     Mental Status: He is alert.     Comments:  Patient is alert but disoriented x 3.  He is not able to answer questions appropriately, for example when asked regarding fever, his reply was about his security system in the house.  Moving all extremities against gravity, however significant tremor    Data Reviewed: CBC with WBC of 4.6, hemoglobin of 10.5, MCV of 101, platelets of 165 CMP with sodium of 134, potassium 3.3, bicarb 23, BUN 39, creatinine 1.63, AST 35, ALT 23, GFR of 40 INR 1.2 Procalcitonin 2.15 Lactic acid negative x 2  COVID-19 PCR positive  DG Chest Port 1 View  Result Date: 05/24/2023 CLINICAL DATA:  Productive cough, altered mental status. EXAM: PORTABLE CHEST 1 VIEW COMPARISON:  November 12, 2022. FINDINGS: Stable cardiomediastinal silhouette. Right upper lobe airspace opacity is noted concerning for pneumonia. Bibasilar opacities are noted concerning for pneumonia or atelectasis. Small left pleural effusion may be present. Bony thorax is unremarkable. IMPRESSION: Right upper lobe opacity concerning for pneumonia. Mild bibasilar opacities are noted concerning for pneumonia or atelectasis. Small left pleural effusion. Followup PA and lateral chest X-ray is recommended in 3-4 weeks following trial of antibiotic therapy to ensure resolution and exclude underlying malignancy. Electronically Signed   By: Lupita Raider M.D.   On: 05/24/2023 15:23    Results are pending, will review when available.  Assessment and Plan:  * Sepsis (HCC) Patient presenting to the ED with 2 days of worsening confusion, productive cough and fever found to have a fever up to 102 in the ED, tachypnea, and relative hypotension with no evidence of shock at this time.  Complicated by encephalopathy and AKI.  Source is COVID-19 infection with possible  underlying aspiration pneumonia as well.  - Telemetry monitoring - Management of CAP and COVID-19 as noted below - Blood cultures ordered - IV fluids as ordered  CAP (community acquired pneumonia) Patient  was initially positive for COVID-19, however chest x-ray demonstrates opacity seen in the right upper and lower lobes on personal review.  Given previous history of aspiration pneumonia and history of dementia, this is concerning for superimposed aspiration pneumonia.  Procalcitonin is elevated.  - Telemetry monitoring - Continue Rocephin and Azithromycin per pharmacy dosing  COVID-19 virus infection Patient has been symptomatic for approximately 2-3 days with productive cough and fever. I strongly suspect there is an aspect of COVID-19 pneumonia as well. Given high risk for complications, will discuss with pharmacy antiviral therapy.  - Tessalon Perles as needed for cough - Remdesivir infusion per updated [facility] COVID-19 guidelines  Acute kidney injury superimposed on chronic kidney disease (HCC) Likely due to underlying infection and possible associated poor p.o. intake  - IV fluids as ordered - Strict in and out - Repeat BMP in the a.m. - High risk for urinary retention.  Bladder scans every shift  Acute encephalopathy In the setting of underlying infection and history of Lewy body dementia.  - Delirium precautions  Lewy body dementia (HCC) History of Lewy body dementia currently on Aricept.  Patient's wife states at baseline, he is usually quite verbal and oriented.  - Resume home Aricept after SLP evaluation  Coronary artery disease involving native coronary artery of native heart without angina pectoris - Continue aspirin and statin after SLP evaluation  PSVT (paroxysmal supraventricular tachycardia) (HCC) - Resume home diltiazem and amiodarone after SLP evaluation  Essential hypertension - Hold home antihypertensive therapy in the setting of AKI and  hypotension  Advance Care Planning:   Code Status: Full Code. No family at bedside on personal evaluation, however EDP discussed code status with wife, who verified Full code.   Consults: None  Family Communication: No family at bedside.   Severity of Illness: The appropriate patient status for this patient is OBSERVATION. Observation status is judged to be reasonable and necessary in order to provide the required intensity of service to ensure the patient's safety. The patient's presenting symptoms, physical exam findings, and initial radiographic and laboratory data in the context of their medical condition is felt to place them at decreased risk for further clinical deterioration. Furthermore, it is anticipated that the patient will be medically stable for discharge from the hospital within 2 midnights of admission.   Author: Verdene Lennert, MD 05/24/2023 5:05 PM  For on call review [email or web address].

## 2023-05-24 NOTE — ED Notes (Signed)
Wife stepped out to get something to eat. Pt attempted to provide urine sample but was unable to.

## 2023-05-24 NOTE — Assessment & Plan Note (Signed)
Resume home diltiazem and amiodarone

## 2023-05-24 NOTE — Consult Note (Signed)
PHARMACY CONSULT NOTE - ELECTROLYTES  Pharmacy Consult for Electrolyte Monitoring and Replacement   Recent Labs:   CrCl cannot be calculated (Unknown ideal weight.). Potassium (mmol/L)  Date Value  05/24/2023 3.3 (L)   Magnesium (mg/dL)  Date Value  16/06/9603 2.0   Calcium (mg/dL)  Date Value  54/05/8118 8.4 (L)   Albumin (g/dL)  Date Value  14/78/2956 3.3 (L)   Phosphorus (mg/dL)  Date Value  21/30/8657 3.0   Sodium (mmol/L)  Date Value  05/24/2023 134 (L)   Corrected Ca: 8.9 mg/dL  Assessment  Michael [name] is a 89 y.o. male presenting with AMS, productive cough, fever. PMH significant for CKD, CAD,PSVT  . Pharmacy has been consulted to monitor and replace electrolytes.  Diet: regular 9/11 >  MIVF: LR @ 150 mL/hr Pertinent medications:  PTA: KCL 20 mEq BID; losartan 50 mg daily   Goal of Therapy:  K 4, Mag 2 given cardiac history All other electrolytes WNL  Plan:  Will give 20 mEq PO KCL x 2 Consider resuming home scheduled KCL once AKI resolves  Check BMP, Mg, Phos with AM labs  Thank you for allowing pharmacy to be a part of this patient's care.  Sharen Hones, PharmD, BCPS Clinical Pharmacist   05/24/2023 6:33 PM

## 2023-05-24 NOTE — ED Notes (Signed)
Report received from [place], [place] and assumed care of patient. Awaiting meds from pharmacy.

## 2023-05-24 NOTE — Assessment & Plan Note (Signed)
Patient with mild baseline compromised renal function, stage 3a  Creatinine on admission increased at least 1.5 times compared to baseline and presumed to have occurred within the last 7 days Likely due to prerenal secondary to severe dehydration from infection Improving with IVF Follow up renal function by BMP Avoid ACEI and NSAIDs

## 2023-05-24 NOTE — Assessment & Plan Note (Signed)
No longer (borderline) hypotensive Hold losartan in the setting of AKI Will cover with prn IV hydralazine

## 2023-05-24 NOTE — ED Triage Notes (Signed)
Pt arrives via [facility] from home for AMS. Pt has had productive cough and worsening confusion for two days. Pt had Tmax 102 today. Given 500 mg Tylenol 1230 today. Pt alert to person and time only currently, which wife says is abnormal.

## 2023-05-24 NOTE — ED Provider Notes (Addendum)
  Physical Exam  BP (!) 122/53   Pulse 68   Temp (!) 102 F (38.9 C) (Oral)   Resp 18   SpO2 93%   Physical Exam I have reviewed the vital signs. General:  Awake, alert, no acute distress. Head:  Normocephalic, Atraumatic. EENT:  PERRL, EOMI, Oral mucosa pink and moist, Neck is supple. Cardiovascular: Regular rate, 2+ distal pulses. Respiratory:  Normal respiratory effort, symmetrical expansion, no distress.   Extremities:  Moving all four extremities through full ROM without pain.   Neuro: Disoriented, unable to hold conversation.  History of dementia but worse mental status per family. Skin:  Warm, dry, no rash.   Psych: Appropriate affect.   Procedures  .Critical Care  Performed by: Janith Lima, MD Authorized by: Janith Lima, MD   Critical care provider statement:    Critical care time (minutes):  35   Critical care was necessary to treat or prevent imminent or life-threatening deterioration of the following conditions:  Sepsis   Critical care was time spent personally by me on the following activities:  Development of treatment plan with patient or surrogate, discussions with consultants, evaluation of patient's response to treatment, examination of patient, ordering and review of laboratory studies, ordering and review of radiographic studies, ordering and performing treatments and interventions, pulse oximetry, re-evaluation of patient's condition and review of old charts   Care discussed with: admitting provider     ED Course / MDM   Clinical Course as of 05/24/23 1558  Wed May 24, 2023  1528 DG Chest [place] 1 View Right upper lobe pneumonia present [DW]  1534 SARS Coronavirus 2 by RT PCR(!): POSITIVE [DW]  1558 Procalcitonin: 2.15 Elevated [DW]    Clinical Course User Index [DW] Janith Lima, MD   Medical Decision Making Amount and/or Complexity of Data Reviewed Labs: ordered. Decision-making details documented in ED Course. Radiology: ordered.  Decision-making details documented in ED Course.  Risk Decision regarding hospitalization.   Patient received in sign out from Dr. Scotty Court.  89 year old male with history of CKD, HTN, dementia presenting today for worsening altered mental status along with fever at home and productive cough.  Patient febrile here and originally tachypneic on arrival.  Laboratory workup notable for positive for COVID as well as pneumonia seen on chest x-ray.  Patient originally given ceftriaxone and azithromycin per Dr. Scotty Court.  Additionally gave 1 L of fluids after results of COVID swab with mild hypotension.  Will admit patient to hospitalist for sepsis in the setting of COVID-19.  Discussed CODE STATUS with wife who wants him to be full code at this time.    Janith Lima, MD 05/24/23 1547    Janith Lima, MD 05/24/23 [phone]

## 2023-05-24 NOTE — Assessment & Plan Note (Signed)
History of Lewy body dementia, currently on Aricept.   Patient's wife states at baseline, he is usually quite verbal and oriented. No longer taking Aricept Delirium precautions Acute encephalopathy on presentation appears to have resolved and patient is back at baseline Cognition evaluation is also impacted by his profound hearing loss (complicated by N95)

## 2023-05-24 NOTE — Assessment & Plan Note (Addendum)
Positive for COVID-19 Chest x-ray demonstrates opacity seen in the right upper lobe This is concerning for superimposed aspiration pneumonia vs. COVID-associated PNA Rocephin and Doxycycline ordered

## 2023-05-24 NOTE — Assessment & Plan Note (Addendum)
Patient presenting to the ED with 2 days of worsening confusion, productive cough and fever Fever up to 102 in the ED with tachypnea and relative hypotension but no evidence of shock Complicated by encephalopathy and AKI Source is COVID-19 infection with possible underlying aspiration pneumonia/CAP Admitted to progressive, will transition to med surg but with continuous pulse ox Management of CAP and COVID-19 as noted below Blood cultures ordered IV fluids as ordered

## 2023-05-25 ENCOUNTER — Encounter: Payer: Self-pay | Admitting: Internal Medicine

## 2023-05-25 ENCOUNTER — Other Ambulatory Visit: Payer: Self-pay

## 2023-05-25 DIAGNOSIS — G3183 Dementia with Lewy bodies: Secondary | ICD-10-CM | POA: Diagnosis present

## 2023-05-25 DIAGNOSIS — F0280 Dementia in other diseases classified elsewhere without behavioral disturbance: Secondary | ICD-10-CM | POA: Diagnosis present

## 2023-05-25 DIAGNOSIS — J1282 Pneumonia due to coronavirus disease 2019: Secondary | ICD-10-CM | POA: Diagnosis present

## 2023-05-25 DIAGNOSIS — M1990 Unspecified osteoarthritis, unspecified site: Secondary | ICD-10-CM | POA: Diagnosis present

## 2023-05-25 DIAGNOSIS — I4719 Other supraventricular tachycardia: Secondary | ICD-10-CM | POA: Diagnosis present

## 2023-05-25 DIAGNOSIS — I2510 Atherosclerotic heart disease of native coronary artery without angina pectoris: Secondary | ICD-10-CM | POA: Diagnosis present

## 2023-05-25 DIAGNOSIS — I5032 Chronic diastolic (congestive) heart failure: Secondary | ICD-10-CM | POA: Diagnosis present

## 2023-05-25 DIAGNOSIS — E860 Dehydration: Secondary | ICD-10-CM | POA: Diagnosis present

## 2023-05-25 DIAGNOSIS — U071 COVID-19: Secondary | ICD-10-CM | POA: Insufficient documentation

## 2023-05-25 DIAGNOSIS — R6520 Severe sepsis without septic shock: Secondary | ICD-10-CM | POA: Diagnosis present

## 2023-05-25 DIAGNOSIS — M549 Dorsalgia, unspecified: Secondary | ICD-10-CM | POA: Diagnosis present

## 2023-05-25 DIAGNOSIS — G8929 Other chronic pain: Secondary | ICD-10-CM | POA: Diagnosis present

## 2023-05-25 DIAGNOSIS — J690 Pneumonitis due to inhalation of food and vomit: Secondary | ICD-10-CM | POA: Diagnosis present

## 2023-05-25 DIAGNOSIS — A4189 Other specified sepsis: Secondary | ICD-10-CM | POA: Diagnosis present

## 2023-05-25 DIAGNOSIS — I440 Atrioventricular block, first degree: Secondary | ICD-10-CM | POA: Diagnosis present

## 2023-05-25 DIAGNOSIS — K219 Gastro-esophageal reflux disease without esophagitis: Secondary | ICD-10-CM | POA: Diagnosis present

## 2023-05-25 DIAGNOSIS — N1831 Chronic kidney disease, stage 3a: Secondary | ICD-10-CM | POA: Diagnosis present

## 2023-05-25 DIAGNOSIS — I700 Atherosclerosis of aorta: Secondary | ICD-10-CM | POA: Diagnosis present

## 2023-05-25 DIAGNOSIS — A419 Sepsis, unspecified organism: Secondary | ICD-10-CM | POA: Diagnosis not present

## 2023-05-25 DIAGNOSIS — E785 Hyperlipidemia, unspecified: Secondary | ICD-10-CM | POA: Diagnosis present

## 2023-05-25 DIAGNOSIS — R410 Disorientation, unspecified: Secondary | ICD-10-CM | POA: Diagnosis present

## 2023-05-25 DIAGNOSIS — N400 Enlarged prostate without lower urinary tract symptoms: Secondary | ICD-10-CM | POA: Diagnosis present

## 2023-05-25 DIAGNOSIS — I130 Hypertensive heart and chronic kidney disease with heart failure and stage 1 through stage 4 chronic kidney disease, or unspecified chronic kidney disease: Secondary | ICD-10-CM | POA: Diagnosis present

## 2023-05-25 DIAGNOSIS — N179 Acute kidney failure, unspecified: Secondary | ICD-10-CM | POA: Diagnosis present

## 2023-05-25 DIAGNOSIS — G9341 Metabolic encephalopathy: Secondary | ICD-10-CM | POA: Diagnosis present

## 2023-05-25 DIAGNOSIS — J189 Pneumonia, unspecified organism: Secondary | ICD-10-CM | POA: Diagnosis not present

## 2023-05-25 DIAGNOSIS — E876 Hypokalemia: Secondary | ICD-10-CM | POA: Diagnosis present

## 2023-05-25 LAB — CBC
HCT: 30.4 % — ABNORMAL LOW (ref 39.0–52.0)
Hemoglobin: 10.2 g/dL — ABNORMAL LOW (ref 13.0–17.0)
MCH: 32.9 pg (ref 26.0–34.0)
MCHC: 33.6 g/dL (ref 30.0–36.0)
MCV: 98.1 fL (ref 80.0–100.0)
Platelets: 133 10*3/uL — ABNORMAL LOW (ref 150–400)
RBC: 3.1 MIL/uL — ABNORMAL LOW (ref 4.22–5.81)
RDW: 13.3 % (ref 11.5–15.5)
WBC: 3.2 10*3/uL — ABNORMAL LOW (ref 4.0–10.5)
nRBC: 0 % (ref 0.0–0.2)

## 2023-05-25 LAB — BASIC METABOLIC PANEL
Anion gap: 10 (ref 5–15)
BUN: 32 mg/dL — ABNORMAL HIGH (ref 8–23)
CO2: 24 mmol/L (ref 22–32)
Calcium: 8.1 mg/dL — ABNORMAL LOW (ref 8.9–10.3)
Chloride: 104 mmol/L (ref 98–111)
Creatinine, Ser: 1.5 mg/dL — ABNORMAL HIGH (ref 0.61–1.24)
GFR, Estimated: 44 mL/min — ABNORMAL LOW (ref >60)
Glucose, Bld: 79 mg/dL (ref 70–99)
Potassium: 3.4 mmol/L — ABNORMAL LOW (ref 3.5–5.1)
Sodium: 138 mmol/L (ref 135–145)

## 2023-05-25 LAB — MAGNESIUM: Magnesium: 2.2 mg/dL (ref 1.7–2.4)

## 2023-05-25 MED ORDER — SODIUM CHLORIDE 0.9 % IV SOLN: INTRAVENOUS | Status: DC

## 2023-05-25 MED ORDER — HYDRALAZINE HCL 20 MG/ML IJ SOLN: 5.0000 mg | INTRAMUSCULAR | Status: DC | PRN

## 2023-05-25 MED ORDER — POTASSIUM CHLORIDE 20 MEQ PO PACK
20.0000 meq | PACK | Freq: Three times a day (TID) | ORAL | Status: AC
  Administered 2023-05-25 (×3): 20 meq via ORAL
  Filled 2023-05-25 (×3): qty 1

## 2023-05-25 NOTE — Consult Note (Addendum)
PHARMACY CONSULT NOTE - ELECTROLYTES  Pharmacy Consult for Electrolyte Monitoring and Replacement   Recent Labs: Height: 5\' 10"  (177.8 cm) Weight: 85.3 kg (188 lb) IBW/kg (Calculated) : 73 Estimated Creatinine Clearance: 34.5 mL/min (A) (by C-G formula based on SCr of 1.5 mg/dL (H)). Potassium (mmol/L)  Date Value  05/25/2023 3.4 (L)   Magnesium (mg/dL)  Date Value  16/06/9603 2.2   Calcium (mg/dL)  Date Value  54/05/8118 8.1 (L)   Albumin (g/dL)  Date Value  14/78/2956 3.3 (L)   Phosphorus (mg/dL)  Date Value  21/30/8657 3.0   Sodium (mmol/L)  Date Value  05/25/2023 138   Corrected Ca: 8.9 mg/dL  Assessment  Michael [name] is a 89 y.o. male presenting with AMS, productive cough, fever. PMH significant for CKD, CAD, PSVT. Pharmacy has been consulted to monitor and replace electrolytes.  Diet: regular  MIVF: LR @ 150 mL/hr Pertinent medications: amiodarone  Goal of Therapy:  K 4, Mag 2 given cardiac history All other electrolytes WNL  Plan:  K 3.4 after replacement yesterday Give 20 mEq KCl PO x3 today  Check BMP with AM labs  Thank you for allowing pharmacy to be a part of this patient's care.  Celene Squibb, PharmD Clinical Pharmacist 05/25/2023 7:51 AM

## 2023-05-25 NOTE — Progress Notes (Signed)
Transition of Care [facility]) - Inpatient Brief Assessment   Patient Details  Name: Michael [name] MRN: [id number] Date of Birth: [birth year]  [facility] CM/SW Contact:    Truddie Hidden, RN Phone Number: 05/25/2023, 4:17 PM   Clinical Narrative: TOC continuing to follow patient's progress throughout discharge planning.   Transition of Care Asessment: Insurance and Status: Insurance coverage has been reviewed Patient has primary care physician: Yes Home environment has been reviewed: home Prior level of function:: independent Prior/Current Home Services: No current home services Social Determinants of Health Reivew: SDOH reviewed no interventions necessary Readmission risk has been reviewed: Yes Transition of care needs: no transition of care needs at this time

## 2023-05-25 NOTE — Progress Notes (Signed)
Progress Note   Patient: Michael [name] ZOX:[id number] DOB: 1931-09-14 DOA: 05/24/2023     0 DOS: the patient was seen and examined on 05/25/2023   Brief hospital course: 89yo with h/o Lewy body dementia, HTN, HLD, HFpEF, stage 3 CKD, and colon cancer s/p colectomy who presented on 9/11 with AMS, cough, and fever to 102.  COVID +.  CXR with RLL PNA, started on Rocephin and Azithromycin.  Assessment and Plan: * Sepsis [facility]) Patient presenting to the ED with 2 days of worsening confusion, productive cough and fever Fever up to 102 in the ED with tachypnea and relative hypotension but no evidence of shock Complicated by encephalopathy and AKI Source is COVID-19 infection with possible underlying aspiration pneumonia/CAP Admitted to progressive, will transition to med surg but with continuous pulse ox Management of CAP and COVID-19 as noted below Blood cultures ordered IV fluids as ordered  CAP (community acquired pneumonia) Positive for COVID-19 Chest x-ray demonstrates opacity seen in the right upper lobe This is concerning for superimposed aspiration pneumonia vs. COVID-associated PNA Rocephin and Doxycycline ordered  COVID-19 virus infection Patient has been symptomatic for approximately 2-3 days with productive cough and fever +COVID with PNA Given high risk for complications, will start Remdesivir x 5 days Tessalon Perles as needed for cough Continuous pulse ox  Acute kidney injury superimposed on chronic kidney disease (HCC) Patient with mild baseline compromised renal function, stage 3a  Creatinine on admission increased at least 1.5 times compared to baseline and presumed to have occurred within the last 7 days Likely due to prerenal secondary to severe dehydration from infection Improving with IVF Follow up renal function by BMP Avoid ACEI and NSAIDs   Lewy body dementia (HCC) History of Lewy body dementia, currently on Aricept.   Patient's wife states at baseline, he  is usually quite verbal and oriented. No longer taking Aricept Delirium precautions Acute encephalopathy on presentation appears to have resolved and patient is back at baseline Cognition evaluation is also impacted by his profound hearing loss (complicated by N95)  Coronary artery disease involving native coronary artery of native heart without angina pectoris No current c/o CP  PSVT (paroxysmal supraventricular tachycardia) (HCC) Resume home diltiazem and amiodarone  Essential hypertension No longer (borderline) hypotensive Hold losartan in the setting of AKI Will cover with prn IV hydralazine      Consultants: None  Procedures: None  Antibiotics: Ceftriaxone 9/11- Remdesivir 9/11- Azithromycin x 1 Doxycycline 9/12-      Subjective: Pleasant, does not think he needs hospitalization (despite new 4L O2 requirement).   Objective: Vitals:   05/25/23 1142 05/25/23 1628  BP: (!) 163/74 (!) 147/76  Pulse: 74 70  Resp: 16 16  Temp: 99 F (37.2 C) 98.6 F (37 C)  SpO2: 96% 95%    Intake/Output Summary (Last 24 hours) at 05/25/2023 1654 Last data filed at 05/25/2023 1522 Gross per 24 hour  Intake 2054.32 ml  Output 1800 ml  Net 254.32 ml   Filed Weights   05/24/23 1851  Weight: 85.3 kg    Exam:  General:  Appears calm and comfortable and is in NAD, hypoxic during my evaluation and O2 turned up to 4L with improvement Eyes:  EOMI, normal lids, iris ENT:  profoundly hard of hearing, grossly normal lips & tongue, mmm Neck:  no LAD, masses or thyromegaly Cardiovascular:  RRR, no m/r/g. No LE edema.  Respiratory:   CTA bilaterally with no wheezes/rales/rhonchi.  Normal respiratory effort.  Increased O2 requirement  to 4L Abdomen:  soft, NT, ND Skin:  no rash or induration seen on limited exam Musculoskeletal:  grossly normal tone BUE/BLE, good ROM, no bony abnormality Psychiatric: pleasant but mildly confused mood and affect, speech fluent and  appropriate Neurologic:  CN 2-12 grossly intact, moves all extremities in coordinated fashion   Data Reviewed: I have reviewed the patient's lab results since admission.  Pertinent labs for today include:   K+ 3.4 BUN 32/Creatinine 1.50/GFR 44, improving Lactate 1.5, 1.2 WBC 3.2 Hgb 10.2 Platelets 133     Family Communication: None present initially; wife was present later in the day and we spoke at the bedside  Disposition: Status is: Inpatient Admit - It is my clinical opinion that admission to INPATIENT is reasonable and necessary because of the expectation that this patient will require hospital care that crosses at least 2 midnights to treat this condition based on the medical complexity of the problems presented.  Given the aforementioned information, the predictability of an adverse outcome is felt to be significant.      Time spent: 50 minutes  Unresulted Labs (From admission, onward)     Start     Ordered   05/26/23 0500  Basic metabolic panel  Tomorrow morning,   R        05/25/23 0753   05/26/23 0500  CBC with Differential/Platelet  Tomorrow morning,   R        05/25/23 1654             Author: Jonah Blue, MD 05/25/2023 4:54 PM  For on call review [email or web address].

## 2023-05-25 NOTE — Plan of Care (Signed)
  Problem: Fluid Volume: Goal: Hemodynamic stability will improve Outcome: Progressing   Problem: Respiratory: Goal: Will maintain a patent airway Outcome: Progressing   Problem: Education: Goal: Knowledge of General Education information will improve Description: Including pain rating scale, medication(s)/side effects and non-pharmacologic comfort measures Outcome: Progressing   Problem: Safety: Goal: Ability to remain free from injury will improve Outcome: Progressing   Problem: Safety: Goal: Ability to remain free from injury will improve Outcome: Progressing

## 2023-05-25 NOTE — Plan of Care (Signed)
Progressing

## 2023-05-25 NOTE — Evaluation (Signed)
Clinical/Bedside Swallow Evaluation Patient Details  Name: Michael [name] MRN: [id number] Date of Birth: [birth year]  Today's Date: 05/25/2023 Time: SLP Start Time (ACUTE ONLY): 1450 SLP Stop Time (ACUTE ONLY): 1600 SLP Time Calculation (min) (ACUTE ONLY): 70 min  Past Medical History:  Past Medical History:  Diagnosis Date   Anemia    Aortic atherosclerosis (HCC)    Arthritis    Aspiration pneumonia (HCC) 2020   BPH (benign prostatic hypertrophy)    CAD (coronary artery disease) 05/23/2016   a.) CT 05/23/2016: extensive 3v CAD.   Cellulitis of face 12/21/2019   Chronic back pain    CKD (chronic kidney disease), stage III (HCC)    Colon cancer (HCC) 1988   Surgery alone, no history of chemo   Diastolic dysfunction 05/23/2014   a.) TTE 05/23/2014; EF 63%; G1DD. b.) TTE 10/27/2014: EF 61%, mild LA dilation; G2DD. c.) TTE 04/10/2020: EF >55%. GLS -20.3%. Triv panvalvular regurgitation; no stenosis.   Diverticulosis    Essential hypertension    First degree AV block    GERD (gastroesophageal reflux disease)    H/O calcium pyrophosphate deposition disease (CPPD)    Hiatal hernia 01/06/2015   History of kidney stones    HLD (hyperlipidemia)    HOH (hard of hearing)    bilateral hearing aids   Hypokalemia    Impotence of organic origin    Incisional hernia 10/2021   Lewy body dementia (HCC)    Peritonsillar abscess determined by examination 05/15/2019   S/p abx, I&D by ENT 05/2019   PSVT (paroxysmal supraventricular tachycardia)    a. 04/2014 Event Monitor: Freq bouts of SVT with rates to 180-->seen by EP with recommendation for RFCA, pt deferred;  b. 05/2014 Echo: EF nl, mild conc LVH, no rwma, Gr1 DD, mildMR/TR.   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   Rotator cuff rupture    Right; s/p repair   Unspecified hearing loss    Ventral hernia 1988   Past Surgical History:  Past Surgical History:  Procedure Laterality Date   CATARACT EXTRACTION W/ INTRAOCULAR LENS IMPLANT  Bilateral    COLONOSCOPY     HEMICOLOECTOMY W/ ANASTOMOSIS  1988   [name]   INGUINAL HERNIA REPAIR Right 1963   INSERTION OF MESH N/A 11/01/2021   Procedure: INSERTION OF MESH;  Surgeon: Carolan Shiver, MD;  Location: [facility] ORS;  Service: General;  Laterality: N/A;   MASTECTOMY, PARTIAL Left 02/07/2013   LEFT SUBCUTANEOUS MASTECTOMY; [facility] Arms. Corliss Skains, MD   ROTATOR CUFF REPAIR Right 2000    [facility]   TOTAL HIP ARTHROPLASTY Right 03/24/2020   Procedure: TOTAL HIP ARTHROPLASTY ANTERIOR APPROACH;  Surgeon: Durene Romans, MD;  Location: [facility];  Service: Orthopedics;  Laterality: Right;  70 mins   TOTAL KNEE ARTHROPLASTY  03/05/2012   Procedure: TOTAL KNEE ARTHROPLASTY;  Surgeon: Loanne Drilling, MD;  Location: [facility];  Service: Orthopedics;  Laterality: Left;   UMBILICAL HERNIA REPAIR  1988   [facility], x 4   VEIN LIGATION AND STRIPPING Right    XI ROBOTIC ASSISTED VENTRAL HERNIA N/A 11/01/2021   Procedure: XI ROBOTIC ASSISTED VENTRAL HERNIA;  Surgeon: Carolan Shiver, MD;  Location: [facility] ORS;  Service: General;  Laterality: N/A;   HPI:  Per admitting H&P "Michael [name] is a 89 y.o. male with medical history significant of Lewy body dementia, hypertension, hyperlipidemia, HFpEF with last EF above 55%, CKD stage III, CAD, diverticulosis, colon cancer s/p colectomy who presents to the ED due to  altered mental status.     History limited due to patient's altered mental status and no family at bedside.  Per chart review, patient has been experiencing a productive cough and worsening confusion for 2 days.  He had a fever at home of 102.  Patient's wife states he is usually more oriented at baseline than he has been. She denied noticing any nausea, vomiting or diarrhea but seems that his oral intake has been decreased.      "    Assessment / Plan / Recommendation  Clinical Impression    Bedside swallow eval revealed no overt s/s of aspiration with any tested consistency.  Pt tested positive for COVID 19, isolation precautions taken. Pt also has pneumonia. Wife present and supportive. Pt was up in the chair very pleasant and talkative. He reported he has a good appetite and tolerated his lunch well. Oral mech exam revealed structures to be functioning adequately. Pt tolerated thin liquids, applesauce and graham cracker without difficulty. Oral phase of swallow was adequate and Pt had min to no oral residue after the swallow. Vocal quality remained clear throughout assessment. Diet altered to regular as Pt tolerated all consistencies well and has requested items that would not be approved on a Dys 3 diet. Wife reports Pt has a good appetite at home and has no current restrictions. Prognosis good. ST to follow up with nursing on toleration of diet.  SLP Visit Diagnosis: Dysphagia, unspecified (R13.10)    Aspiration Risk  Mild aspiration risk    Diet Recommendation Regular    Liquid Administration via: Cup;Straw Medication Administration: Whole meds with liquid Supervision: Patient able to self feed Compensations: Slow rate Postural Changes: Seated upright at 90 degrees;Remain upright for at least 30 minutes after po intake    Other  Recommendations      Recommendations for follow up therapy are one component of a multi-disciplinary discharge planning process, led by the attending physician.  Recommendations may be updated based on patient status, additional functional criteria and insurance authorization.  Follow up Recommendations Follow physician's recommendations for discharge plan and follow up therapies      Assistance Recommended at Discharge  None  Functional Status Assessment Patient has had a recent decline in their functional status and demonstrates the ability to make significant improvements in function in a reasonable and predictable amount of time.  Frequency and Duration     N/A       Prognosis Prognosis for improved oropharyngeal function:  Good      Swallow Study   General Date of Onset: 05/24/23 HPI: Per admitting H&P "Michael [name] is a 89 y.o. male with medical history significant of Lewy body dementia, hypertension, hyperlipidemia, HFpEF with last EF above 55%, CKD stage III, CAD, diverticulosis, colon cancer s/p colectomy who presents to the ED due to altered mental status.     History limited due to patient's altered mental status and no family at bedside.  Per chart review, patient has been experiencing a productive cough and worsening confusion for 2 days.  He had a fever at home of 102.  Patient's wife states he is usually more oriented at baseline than he has been. She denied noticing any nausea, vomiting or diarrhea but seems that his oral intake has been decreased.      " Type of Study: Bedside Swallow Evaluation Previous Swallow Assessment: MBSS 2016 Diet Prior to this Study: Dysphagia 3 (mechanical soft) History of Recent Intubation: No Behavior/Cognition: Alert;Cooperative;Pleasant mood Oral Cavity  Assessment: Within Functional Limits Oral Care Completed by SLP: No Oral Cavity - Dentition: Adequate natural dentition;Missing dentition (Wife reports his bridge came out) Vision: Functional for self-feeding Self-Feeding Abilities: Able to feed self Patient Positioning: Upright in chair Baseline Vocal Quality: Normal Volitional Cough: Strong Volitional Swallow: Able to elicit    Oral/Motor/Sensory Function Overall Oral Motor/Sensory Function: Within functional limits   Ice Chips Ice chips: Within functional limits Presentation: Spoon   Thin Liquid Thin Liquid: Within functional limits Presentation: Cup;Spoon;Straw    Nectar Thick Nectar Thick Liquid: Not tested   Honey Thick Honey Thick Liquid: Not tested   Puree Puree: Within functional limits Presentation: Self Fed   Solid     Solid: Within functional limits      Eather Colas 05/25/2023,4:27 PM

## 2023-05-25 NOTE — Hospital Course (Signed)
89yo with h/o Lewy body dementia, HTN, HLD, HFpEF, stage 3 CKD, and colon cancer s/p colectomy who presented on 9/11 with AMS, cough, and fever to 102.  COVID +.  CXR with RLL PNA, started on Rocephin and Azithromycin.

## 2023-05-26 DIAGNOSIS — A419 Sepsis, unspecified organism: Secondary | ICD-10-CM | POA: Diagnosis not present

## 2023-05-26 DIAGNOSIS — J189 Pneumonia, unspecified organism: Secondary | ICD-10-CM | POA: Diagnosis not present

## 2023-05-26 LAB — CBC WITH DIFFERENTIAL/PLATELET
Abs Immature Granulocytes: 0.01 10*3/uL (ref 0.00–0.07)
Basophils Absolute: 0 10*3/uL (ref 0.0–0.1)
Basophils Relative: 0 %
Eosinophils Absolute: 0.3 10*3/uL (ref 0.0–0.5)
Eosinophils Relative: 9 %
HCT: 28.9 % — ABNORMAL LOW (ref 39.0–52.0)
Hemoglobin: 9.7 g/dL — ABNORMAL LOW (ref 13.0–17.0)
Immature Granulocytes: 0 %
Lymphocytes Relative: 22 %
Lymphs Abs: 0.8 10*3/uL (ref 0.7–4.0)
MCH: 32.7 pg (ref 26.0–34.0)
MCHC: 33.6 g/dL (ref 30.0–36.0)
MCV: 97.3 fL (ref 80.0–100.0)
Monocytes Absolute: 0.3 10*3/uL (ref 0.1–1.0)
Monocytes Relative: 9 %
Neutro Abs: 2.1 10*3/uL (ref 1.7–7.7)
Neutrophils Relative %: 60 %
Platelets: 150 10*3/uL (ref 150–400)
RBC: 2.97 MIL/uL — ABNORMAL LOW (ref 4.22–5.81)
RDW: 13.3 % (ref 11.5–15.5)
WBC: 3.4 10*3/uL — ABNORMAL LOW (ref 4.0–10.5)
nRBC: 0 % (ref 0.0–0.2)

## 2023-05-26 LAB — BASIC METABOLIC PANEL
Anion gap: 9 (ref 5–15)
BUN: 26 mg/dL — ABNORMAL HIGH (ref 8–23)
CO2: 26 mmol/L (ref 22–32)
Calcium: 8 mg/dL — ABNORMAL LOW (ref 8.9–10.3)
Chloride: 104 mmol/L (ref 98–111)
Creatinine, Ser: 1.18 mg/dL (ref 0.61–1.24)
GFR, Estimated: 59 mL/min — ABNORMAL LOW (ref >60)
Glucose, Bld: 85 mg/dL (ref 70–99)
Potassium: 3.2 mmol/L — ABNORMAL LOW (ref 3.5–5.1)
Sodium: 139 mmol/L (ref 135–145)

## 2023-05-26 MED ORDER — ROPINIROLE HCL 1 MG PO TABS
0.5000 mg | ORAL_TABLET | Freq: Every day | ORAL | Status: DC
  Administered 2023-05-26: 0.5 mg via ORAL
  Filled 2023-05-26: qty 1

## 2023-05-26 MED ORDER — POTASSIUM CHLORIDE CRYS ER 20 MEQ PO TBCR
60.0000 meq | EXTENDED_RELEASE_TABLET | Freq: Once | ORAL | Status: AC
  Administered 2023-05-26: 60 meq via ORAL
  Filled 2023-05-26: qty 3

## 2023-05-26 MED ORDER — POTASSIUM CHLORIDE 20 MEQ PO PACK
40.0000 meq | PACK | Freq: Once | ORAL | Status: DC
  Filled 2023-05-26: qty 2

## 2023-05-26 MED ORDER — POTASSIUM CHLORIDE 20 MEQ PO PACK
60.0000 meq | PACK | Freq: Once | ORAL | Status: DC
  Filled 2023-05-26: qty 3

## 2023-05-26 MED ORDER — POTASSIUM CHLORIDE CRYS ER 20 MEQ PO TBCR
40.0000 meq | EXTENDED_RELEASE_TABLET | Freq: Once | ORAL | Status: AC
  Administered 2023-05-26: 40 meq via ORAL
  Filled 2023-05-26: qty 2

## 2023-05-26 MED ORDER — TRAZODONE HCL 50 MG PO TABS
25.0000 mg | ORAL_TABLET | Freq: Every evening | ORAL | Status: DC | PRN
  Administered 2023-05-26: 25 mg via ORAL
  Filled 2023-05-26: qty 1

## 2023-05-26 MED ORDER — CEPHALEXIN 500 MG PO CAPS
500.0000 mg | ORAL_CAPSULE | Freq: Two times a day (BID) | ORAL | Status: DC
  Administered 2023-05-26 – 2023-05-27 (×2): 500 mg via ORAL
  Filled 2023-05-26 (×2): qty 1

## 2023-05-26 MED ORDER — DOXYCYCLINE HYCLATE 100 MG PO TABS
100.0000 mg | ORAL_TABLET | Freq: Two times a day (BID) | ORAL | Status: DC
  Administered 2023-05-26 – 2023-05-27 (×3): 100 mg via ORAL
  Filled 2023-05-26 (×3): qty 1

## 2023-05-26 MED ORDER — LOSARTAN POTASSIUM 50 MG PO TABS
50.0000 mg | ORAL_TABLET | Freq: Every day | ORAL | Status: DC
  Administered 2023-05-26 – 2023-05-27 (×2): 50 mg via ORAL
  Filled 2023-05-26 (×2): qty 1

## 2023-05-26 NOTE — Plan of Care (Signed)
  Problem: Fluid Volume: Goal: Hemodynamic stability will improve Outcome: Progressing   Problem: Clinical Measurements: Goal: Diagnostic test results will improve Outcome: Progressing Goal: Signs and symptoms of infection will decrease Outcome: Progressing   Problem: Respiratory: Goal: Ability to maintain adequate ventilation will improve Outcome: Progressing   Problem: Education: Goal: Knowledge of risk factors and measures for prevention of condition will improve Outcome: Progressing   Problem: Coping: Goal: Psychosocial and spiritual needs will be supported Outcome: Progressing   Problem: Respiratory: Goal: Will maintain a patent airway Outcome: Progressing Goal: Complications related to the disease process, condition or treatment will be avoided or minimized Outcome: Progressing   Problem: Education: Goal: Knowledge of General Education information will improve Description: Including pain rating scale, medication(s)/side effects and non-pharmacologic comfort measures Outcome: Progressing   Problem: Health Behavior/Discharge Planning: Goal: Ability to manage health-related needs will improve Outcome: Progressing   Problem: Clinical Measurements: Goal: Ability to maintain clinical measurements within normal limits will improve Outcome: Progressing Goal: Will remain free from infection Outcome: Progressing Goal: Diagnostic test results will improve Outcome: Progressing Goal: Respiratory complications will improve Outcome: Progressing Goal: Cardiovascular complication will be avoided Outcome: Progressing   Problem: Activity: Goal: Risk for activity intolerance will decrease Outcome: Progressing   Problem: Nutrition: Goal: Adequate nutrition will be maintained Outcome: Progressing   Problem: Coping: Goal: Level of anxiety will decrease Outcome: Progressing   Problem: Elimination: Goal: Will not experience complications related to bowel motility Outcome:  Progressing Goal: Will not experience complications related to urinary retention Outcome: Progressing   Problem: Pain Managment: Goal: General experience of comfort will improve Outcome: Progressing   Problem: Safety: Goal: Ability to remain free from injury will improve Outcome: Progressing   Problem: Skin Integrity: Goal: Risk for impaired skin integrity will decrease Outcome: Progressing

## 2023-05-26 NOTE — Progress Notes (Signed)
SATURATION QUALIFICATIONS: (This note is used to comply with regulatory documentation for home oxygen)  Patient Saturations on Room Air at Rest = 88%  Patient Saturations on Room Air while Ambulating = 86%  Patient Saturations on 2 Liters of oxygen while Ambulating = 93%  Please briefly explain why patient needs home oxygen: patients oxygen level decreases to 86% while on room air, even at rest.

## 2023-05-26 NOTE — Evaluation (Signed)
Occupational Therapy Evaluation Patient Details Name: Michael [name] MRN: [id number] DOB: 07-31-1932 Today's Date: 05/26/2023   History of Present Illness 89 y/o male presented to ED on 05/24/23 for worsening confusion and productive cough. Found to be COVID +   Clinical Impression   Michael [name] was seen for OT/PT co-evaluation this date. Prior to hospital admission, pt was MOD I using RW. Pt lives with spouse in 2 level home, 3 STE, lives on main level. Pt currently requires SUPERVISION + RW for toilet t/f, pericare, and standing hand washing - cues to sequence paper towel dispenser. MOD I doff B socks in sitting. Pt would benefit from skilled OT to address noted impairments and functional limitations (see below for any additional details). Upon hospital discharge, recommend OT follow up and supervision for cognitive deficits.     If plan is discharge home, recommend the following: A little help with walking and/or transfers;A little help with bathing/dressing/bathroom;Supervision due to cognitive status    Functional Status Assessment  Patient has had a recent decline in their functional status and demonstrates the ability to make significant improvements in function in a reasonable and predictable amount of time.  Equipment Recommendations  BSC/3in1    Recommendations for Other Services       Precautions / Restrictions Precautions Precautions: Fall Restrictions Weight Bearing Restrictions: No      Mobility Bed Mobility Overal bed mobility: Needs Assistance Bed Mobility: Sit to Supine       Sit to supine: Supervision        Transfers Overall transfer level: Needs assistance Equipment used: Rolling walker (2 wheels) Transfers: Sit to/from Stand Sit to Stand: Supervision           General transfer comment: increased time from low commode height      Balance Overall balance assessment: Needs assistance Sitting-balance support: No upper extremity supported, Feet  supported Sitting balance-Leahy Scale: Good     Standing balance support: No upper extremity supported, During functional activity Standing balance-Leahy Scale: Fair                             ADL either performed or assessed with clinical judgement   ADL Overall ADL's : Needs assistance/impaired                                       General ADL Comments: SUPERVISION + RW for toilet t/f, pericare, and standing hand washing - cues to sequence paper towel dispenser. MOD I doff B socks in sittting      Pertinent Vitals/Pain Pain Assessment Pain Assessment: No/denies pain     Extremity/Trunk Assessment Upper Extremity Assessment Upper Extremity Assessment: Overall WFL for tasks assessed   Lower Extremity Assessment Lower Extremity Assessment: Generalized weakness       Communication Communication Communication: Hearing impairment   Cognition Arousal: Alert Behavior During Therapy: WFL for tasks assessed/performed, Impulsive Overall Cognitive Status: History of cognitive impairments - at baseline                                 General Comments: oriented to self, place, and situation                Home Living Family/patient expects to be discharged to:: Private residence Living Arrangements: Spouse/significant other Available  Help at Discharge: Family Type of Home: House Home Access: Stairs to enter [employer] of Steps: 3   Home Layout: One level               Home Equipment: Agricultural consultant (2 wheels);Cane - single point;Shower seat - built in          Prior Functioning/Environment Prior Level of Function : Independent/Modified Independent             Mobility Comments: reports usin RW "all the time," endorses falls hx          OT Problem List: Decreased strength;Decreased range of motion;Decreased activity tolerance;Impaired balance (sitting and/or standing);Decreased safety  awareness      OT Treatment/Interventions: Self-care/ADL training;Therapeutic exercise;Energy conservation;DME and/or AE instruction;Therapeutic activities;Patient/family education;Balance training    OT Goals(Current goals can be found in the care plan section) Acute Rehab OT Goals Patient Stated Goal: to go home OT Goal Formulation: With patient Time For Goal Achievement: 06/09/23 Potential to Achieve Goals: Good ADL Goals Pt Will Perform Grooming: with modified independence;standing Pt Will Perform Lower Body Dressing: with modified independence;sit to/from stand Pt Will Transfer to Toilet: with modified independence;ambulating;regular height toilet  OT Frequency: Min 1X/week    Co-evaluation              AM-PAC OT "6 Clicks" Daily Activity     Outcome Measure Help from another person eating meals?: None Help from another person taking care of personal grooming?: A Little Help from another person toileting, which includes using toliet, bedpan, or urinal?: A Little Help from another person bathing (including washing, rinsing, drying)?: A Little Help from another person to put on and taking off regular upper body clothing?: None Help from another person to put on and taking off regular lower body clothing?: A Little 6 Click Score: 20   End of Session Equipment Utilized During Treatment: Rolling walker (2 wheels)  Activity Tolerance: Patient tolerated treatment well Patient left: in bed;with call bell/phone within reach;with bed alarm set  OT Visit Diagnosis: Unsteadiness on feet (R26.81);Muscle weakness (generalized) (M62.81)                Time: 4098-1191 OT Time Calculation (min): 31 min Charges:  OT General Charges $OT Visit: 1 Visit OT Evaluation $OT Eval Moderate Complexity: 1 Mod OT Treatments $Self Care/Home Management : 8-22 mins  Michael [name], M.S. OTR/L  05/26/23, 1:43 PM  ascom [phone]

## 2023-05-26 NOTE — Evaluation (Signed)
Physical Therapy Evaluation Patient Details Name: Michael [name] MRN: [id number] DOB: 11/10/1931 Today's Date: 05/26/2023  History of Present Illness  89 y/o male presented to ED on 05/24/23 for worsening confusion and productive cough. Found to be COVID +. PMH: HTN, Lewy body dementia, HTN, HFpEF, CKD stage III, CAD, colon cancer s/p colectomy  Clinical Impression  Patient admitted for the above. PTA, patient was modI with RW and lives with wife. Patient currently requires supervision with RW during mobility to/from bathroom. No LOB noted during mobility and able to navigate through crowded room with ease. Patient will benefit from skilled PT services during acute stay to address listed deficits. Patient will benefit from ongoing therapy at discharge to maximize functional independence and safety.         If plan is discharge home, recommend the following: A little help with walking and/or transfers;A little help with bathing/dressing/bathroom;Assistance with cooking/housework;Direct supervision/assist for medications management;Direct supervision/assist for financial management;Help with stairs or ramp for entrance;Supervision due to cognitive status   Can travel by private vehicle        Equipment Recommendations Rolling Linder Prajapati (2 wheels)  Recommendations for Other Services       Functional Status Assessment Patient has had a recent decline in their functional status and demonstrates the ability to make significant improvements in function in a reasonable and predictable amount of time.     Precautions / Restrictions Precautions Precautions: Fall Restrictions Weight Bearing Restrictions: No      Mobility  Bed Mobility Overal bed mobility: Needs Assistance Bed Mobility: Sit to Supine       Sit to supine: Supervision        Transfers Overall transfer level: Needs assistance Equipment used: Rolling Senita Corredor (2 wheels) Transfers: Sit to/from Stand Sit to Stand:  Supervision           General transfer comment: increased time from low commode height    Ambulation/Gait Ambulation/Gait assistance: Supervision Gait Distance (Feet): 20 Feet (+20') Assistive device: Rolling Colyn Miron (2 wheels) Gait Pattern/deviations: Step-through pattern, Decreased stride length Gait velocity: decreased     General Gait Details: supervision for safety. Able to navigate through crowded small room  Stairs            Wheelchair Mobility     Tilt Bed    Modified Rankin (Stroke Patients Only)       Balance Overall balance assessment: Needs assistance Sitting-balance support: No upper extremity supported, Feet supported Sitting balance-Leahy Scale: Good     Standing balance support: No upper extremity supported, During functional activity Standing balance-Leahy Scale: Fair                               Pertinent Vitals/Pain Pain Assessment Pain Assessment: No/denies pain    Home Living Family/patient expects to be discharged to:: Private residence Living Arrangements: Spouse/significant other Available Help at Discharge: Family Type of Home: House Home Access: Stairs to enter   Secretary/administrator of Steps: 3   Home Layout: One level Home Equipment: Agricultural consultant (2 wheels);Cane - single point;Shower seat - built in      Prior Function Prior Level of Function : Independent/Modified Independent             Mobility Comments: reports usin RW "all the time," endorses falls hx       Extremity/Trunk Assessment   Upper Extremity Assessment Upper Extremity Assessment: Overall WFL for tasks assessed  Lower Extremity Assessment Lower Extremity Assessment: Generalized weakness       Communication   Communication Communication: Hearing impairment  Cognition Arousal: Alert Behavior During Therapy: WFL for tasks assessed/performed, Impulsive Overall Cognitive Status: History of cognitive impairments - at  baseline                                 General Comments: oriented to self, place, and situation        General Comments      Exercises     Assessment/Plan    PT Assessment Patient needs continued PT services  PT Problem List Decreased strength;Decreased activity tolerance;Decreased balance;Decreased mobility;Decreased cognition;Decreased knowledge of use of DME;Decreased safety awareness;Decreased knowledge of precautions;Cardiopulmonary status limiting activity       PT Treatment Interventions DME instruction;Stair training;Gait training;Functional mobility training;Therapeutic activities;Therapeutic exercise;Balance training;Patient/family education    PT Goals (Current goals can be found in the Care Plan section)  Acute Rehab PT Goals Patient Stated Goal: did not state PT Goal Formulation: Patient unable to participate in goal setting Time For Goal Achievement: 06/09/23 Potential to Achieve Goals: Good    Frequency Min 1X/week     Co-evaluation               AM-PAC PT "6 Clicks" Mobility  Outcome Measure Help needed turning from your back to your side while in a flat bed without using bedrails?: A Little Help needed moving from lying on your back to sitting on the side of a flat bed without using bedrails?: A Little Help needed moving to and from a bed to a chair (including a wheelchair)?: A Little Help needed standing up from a chair using your arms (e.g., wheelchair or bedside chair)?: A Little Help needed to walk in hospital room?: A Little Help needed climbing 3-5 steps with a railing? : A Little 6 Click Score: 18    End of Session   Activity Tolerance: Patient tolerated treatment well Patient left: in bed;with call bell/phone within reach;with bed alarm set Nurse Communication: Mobility status PT Visit Diagnosis: Unsteadiness on feet (R26.81);Muscle weakness (generalized) (M62.81)    Time: 2952-8413 PT Time Calculation (min) (ACUTE  ONLY): 31 min   Charges:   PT Evaluation $PT Eval Moderate Complexity: 1 Mod   PT General Charges $$ ACUTE PT VISIT: 1 Visit         Maylon Peppers, PT, DPT Physical Therapist - [facility]  [facility]   Simone Rodenbeck A Azka Steger 05/26/2023, 1:59 PM

## 2023-05-26 NOTE — Consult Note (Signed)
PHARMACY CONSULT NOTE - ELECTROLYTES  Pharmacy Consult for Electrolyte Monitoring and Replacement   Recent Labs: Height: 5\' 10"  (177.8 cm) Weight: 85.3 kg (188 lb) IBW/kg (Calculated) : 73 Estimated Creatinine Clearance: 43.8 mL/min (by C-G formula based on SCr of 1.18 mg/dL). Potassium (mmol/L)  Date Value  05/26/2023 3.2 (L)   Magnesium (mg/dL)  Date Value  40/98/1191 2.2   Calcium (mg/dL)  Date Value  47/82/9562 8.0 (L)   Albumin (g/dL)  Date Value  13/04/6577 3.3 (L)   Phosphorus (mg/dL)  Date Value  46/96/2952 3.0   Sodium (mmol/L)  Date Value  05/26/2023 139   Corrected Ca: 8.9 mg/dL  Assessment  Michael [name] is a 89 y.o. male presenting with AMS, productive cough, fever. PMH significant for CKD, CAD, PSVT. Pharmacy has been consulted to monitor and replace electrolytes.  Diet: regular  MIVF: LR @ 75 mL/hr Pertinent medications: amiodarone  Goal of Therapy:  K 4, Mag 2 given cardiac history All other electrolytes WNL  Plan:  K 3.2 after replacement yesterday Give aggressive replacement today with 60 mEq KCl PO x1 followed by 40 KCl PO x1 given persistent hypokalemia despite daily replacement  Check BMP with AM labs  Thank you for allowing pharmacy to be a part of this patient's care.  Celene Squibb, PharmD Clinical Pharmacist 05/26/2023 8:19 AM

## 2023-05-26 NOTE — TOC Progression Note (Addendum)
Transition of Care [facility]) - Progression Note    Patient Details  Name: CHRISHAUN STOEN MRN: [id number] Date of Birth: [birth year]  Transition of Care [facility]) CM/SW Contact  Truddie Hidden, RN Phone Number: 05/26/2023, 4:16 PM  Clinical Narrative:    Patient requiring new oxygen and RW.  Request sent to Jon from [employer].  Attempt to reach patient's spouse no answer.          Expected Discharge Plan and Services                                               Social Determinants of Health (SDOH) Interventions SDOH Screenings   Food Insecurity: No Food Insecurity (05/25/2023)  Housing: Low Risk  (05/25/2023)  Transportation Needs: No Transportation Needs (05/25/2023)  Utilities: Not At Risk (05/25/2023)  Depression (PHQ2-9): Low Risk  (05/11/2020)  Financial Resource Strain: Low Risk  (05/09/2023)   Received from [facility]  Tobacco Use: Low Risk  (05/09/2023)   Received from [facility]    Readmission Risk Interventions     No data to display

## 2023-05-26 NOTE — Progress Notes (Addendum)
[facility]  - [place] [facility]   PATIENT NAME: Michael [name]    MR#:  [id number]  DATE OF BIRTH:  10-Apr-1932  SUBJECTIVE:  no family in the room. Patient hard on hearing however did understand me talking loud and writing up on a piece of paper information. Overall feels better eat good. Tells me his had in the chair yesterday and felt better. No other issues per RN. Feels somewhat weak from baseline.    VITALS:  Blood pressure (!) 173/85, pulse 79, temperature 99.1 F (37.3 C), temperature source Oral, resp. rate 15, height 5\' 10"  (1.778 m), weight 85.3 kg, SpO2 93%.  PHYSICAL EXAMINATION:   GENERAL:  89 y.o.-year-old patient with no acute distress.  LUNGS:decreased breath sounds bilaterally, no wheezing CARDIOVASCULAR: S1, S2 normal. No murmur   ABDOMEN: Soft, nontender, nondistended.  EXTREMITIES: No  edema b/l.    NEUROLOGIC: nonfocal  patient is alert and awake SKIN: No obvious rash, lesion, or ulcer.   LABORATORY PANEL:  CBC Recent Labs  Lab 05/26/23 0622  WBC 3.4*  HGB 9.7*  HCT 28.9*  PLT 150    Chemistries  Recent Labs  Lab 05/24/23 1402 05/25/23 0536 05/26/23 0622  NA 134* 138 139  K 3.3* 3.4* 3.2*  CL 100 104 104  CO2 23 24 26   GLUCOSE 76 79 85  BUN 39* 32* 26*  CREATININE 1.63* 1.50* 1.18  CALCIUM 8.4* 8.1* 8.0*  MG  --  2.2  --   AST 35  --   --   ALT 23  --   --   ALKPHOS 38  --   --   BILITOT 0.8  --   --    Cardiac Enzymes No results for input(s): "TROPONINI" in the last 168 hours. RADIOLOGY:  DG Chest Port 1 View  Result Date: 05/24/2023 CLINICAL DATA:  Productive cough, altered mental status. EXAM: PORTABLE CHEST 1 VIEW COMPARISON:  November 12, 2022. FINDINGS: Stable cardiomediastinal silhouette. Right upper lobe airspace opacity is noted concerning for pneumonia. Bibasilar opacities are noted concerning for pneumonia or atelectasis. Small left pleural effusion may be present. Bony thorax is unremarkable. IMPRESSION:  Right upper lobe opacity concerning for pneumonia. Mild bibasilar opacities are noted concerning for pneumonia or atelectasis. Small left pleural effusion. Followup PA and lateral chest X-ray is recommended in 3-4 weeks following trial of antibiotic therapy to ensure resolution and exclude underlying malignancy. Electronically Signed   By: Lupita Raider M.D.   On: 05/24/2023 15:23    Assessment and Plan  89yo with h/o Lewy body dementia, HTN, HLD, HFpEF, stage 3 CKD, and colon cancer s/p colectomy who presented on 9/11 with AMS, cough, and fever to 102.  COVID +.  CXR with RLL PNA, started on Rocephin and Azithromycin.    Sepsis (HCC),severe --Patient presenting to the ED with 2 days of worsening confusion, productive cough and fever Fever up to 102 in the ED with tachypnea and relative hypotension but no evidence of shock --Complicated by encephalopathy and AKI --Source is COVID-19 infection with possible underlying aspiration pneumonia/CAP --Management of CAP and COVID-19 as noted below --Blood cultures negative --received IV fluids as ordered   CAP (community acquired pneumonia) --Positive for COVID-19 --Chest x-ray demonstrates opacity seen in the right upper lobe --This is concerning for superimposed aspiration pneumonia vs. COVID-associated PNA Rocephin and Doxycycline ordered -- seen by speech therapist. -- Assess for home oxygen. According to RN patient dip down 88 on room air recovered  with 2 L to 93 to 94%.   COVID-19 virus infection --Patient has been symptomatic for approximately 2-3 days with productive cough and fever +COVID with PNA --Given high risk for complications, will start Remdesivir x 5 days --Tessalon Perles as needed for cough   Acute kidney injury superimposed on chronic kidney disease (HCC) --Patient with mild baseline compromised renal function, stage 3a  --Creatinine on admission increased at least 1.5 times compared to baseline and presumed to have  occurred within the last 7 days --Likely due to prerenal secondary to severe dehydration from infection   Lewy body dementia (HCC) --History of Lewy body dementia, currently on Aricept.   --Patient's wife states at baseline, he is usually quite verbal and oriented. --No longer taking Aricept --Delirium precautions --Acute encephalopathy on presentation appears to have resolved and patient is back at baseline --Cognition evaluation is also impacted by his profound hearing loss (complicated by N95)   Coronary artery disease involving native coronary artery of native heart without angina pectoris --No current c/o CP   PSVT (paroxysmal supraventricular tachycardia) (HCC) --Resume home diltiazem and amiodarone   Essential hypertension No longer (borderline) hypotensive Hold losartan in the setting of AKI Will cover with prn IV hydralazine   Family communication :wife on phone Consults :none CODE STATUS: FULL DVT Prophylaxis :lovenox Level of care: Med-Surg Status is: Inpatient Remains inpatient appropriate because: sepsis due to covid +bacterial PNA    TOTAL TIME TAKING CARE OF THIS PATIENT: 35 minutes.  >50% time spent on counselling and coordination of care  Note: This dictation was prepared with Dragon dictation along with smaller phrase technology. Any transcriptional errors that result from this process are unintentional.  Enedina Finner M.D    [facility]   CC: Primary care physician; Dorothey Baseman, MD

## 2023-05-26 NOTE — Progress Notes (Signed)
CROSS COVER NOTE  NAME: Michael [name] MRN: [id number] DOB : [birth year]    Concern as stated by nurse / staff     Hello, This patient is extremely agitated as the shift continues. He will not stay in the bed. He is a high fall risk, confusion, Covid-19 patient. I have given him his Ambien, oxycodone and Percoset at 2025. He has been awake and actively attempting to get out of the door since 1am. We are now using the tele-sitter. May I have something to help him rest??    Pertinent findings on chart review:   Assessment and  Interventions   Assessment: Patient in bed ans asleep  Plan: Delirium precautions Telesitter     Donnie Mesa NP [facility] Cross Cover 7pm-7am - check amion for availability Pager [phone]

## 2023-05-26 NOTE — Plan of Care (Signed)
Problem: Pain Managment: Goal: General experience of comfort will improve Outcome: Progressing   Problem: Safety: Goal: Ability to remain free from injury will improve Outcome: Progressing   Problem: Skin Integrity: Goal: Risk for impaired skin integrity will decrease Outcome: Progressing

## 2023-05-26 NOTE — Progress Notes (Addendum)
SLP Cancellation Note  Patient Details Name: Michael [name] MRN: [id number] DOB: [birth year]   Cancelled treatment:       Reason Eval/Treat Not Completed: SLP screened, no needs identified, will sign off (chart reviewed; consulted NSG re: pt's status today post recommendation from BSE.)  Pt is currently on a regular diet s/p BSE on 05/25/23. No reports of difficulty w/ the diet consistency; NSG reports good toleration of diet and of swallowing pills w/ no overt clinical s/s of aspiration noted.  Pt has a Baseline of GERD, dx'd. Recommend REFLUX precautions. Also recommend assistance w/ setup and support at meals d/t Acute illness. NSG agreed. No further skilled ST services indicated as pt appears at his baseline swallowing. NSG to reconsult if any change in status while admitted.      Michael Som, MS, CCC-SLP Speech Language Pathologist Rehab Services; [facility] [phone] (ascom) Venora Kautzman 05/26/2023, 5:09 PM

## 2023-05-27 DIAGNOSIS — A419 Sepsis, unspecified organism: Secondary | ICD-10-CM | POA: Diagnosis not present

## 2023-05-27 DIAGNOSIS — J189 Pneumonia, unspecified organism: Secondary | ICD-10-CM | POA: Diagnosis not present

## 2023-05-27 LAB — POTASSIUM: Potassium: 3.6 mmol/L (ref 3.5–5.1)

## 2023-05-27 MED ORDER — CEPHALEXIN 500 MG PO CAPS: 500.0000 mg | ORAL_CAPSULE | Freq: Two times a day (BID) | ORAL | 0 refills | Status: AC

## 2023-05-27 MED ORDER — POTASSIUM CHLORIDE CRYS ER 20 MEQ PO TBCR: 20.0000 meq | EXTENDED_RELEASE_TABLET | Freq: Once | ORAL | Status: DC

## 2023-05-27 MED ORDER — NYSTATIN 100000 UNIT/ML MT SUSP
5.0000 mL | Freq: Four times a day (QID) | OROMUCOSAL | Status: DC
Start: 2023-05-27 — End: 2023-05-27

## 2023-05-27 MED ORDER — POTASSIUM CHLORIDE CRYS ER 20 MEQ PO TBCR
40.0000 meq | EXTENDED_RELEASE_TABLET | Freq: Once | ORAL | Status: AC
  Administered 2023-05-27: 40 meq via ORAL
  Filled 2023-05-27: qty 2

## 2023-05-27 MED ORDER — DOXYCYCLINE HYCLATE 100 MG PO TABS: 100.0000 mg | ORAL_TABLET | Freq: Two times a day (BID) | ORAL | 0 refills | Status: AC

## 2023-05-27 NOTE — Discharge Instructions (Addendum)
Use oxygen 2L/min as per instructions F/u [facility] if you have your PCP at [facility]

## 2023-05-27 NOTE — Discharge Summary (Signed)
Physician Discharge Summary   Patient: Michael [name] MRN: [id number] DOB: May 24, 1932  Admit date:     05/24/2023  Discharge date: 05/27/23  Discharge Physician: Enedina Finner   PCP: Dorothey Baseman, MD   Recommendations at discharge:    F/u PCP at [facility] or Dr Terance Hart in 1-2 weeks Use your oxygen 2L/min as per instructions  Discharge Diagnoses: Principal Problem:   Sepsis due to pneumonia [facility]) Active Problems:   CAP (community acquired pneumonia)   COVID-19 virus infection   Acute kidney injury superimposed on chronic kidney disease (HCC)   Essential hypertension   PSVT (paroxysmal supraventricular tachycardia) (HCC)   Coronary artery disease involving native coronary artery of native heart without angina pectoris   Lewy body dementia (HCC)  89yo with h/o Lewy body dementia, HTN, HLD, HFpEF, stage 3 CKD, and colon cancer s/p colectomy who presented on 9/11 with AMS, cough, and fever to 102.  COVID +.  CXR with RLL PNA, started on Rocephin and Azithromycin.     Sepsis (HCC),severe --Patient presenting to the ED with 2 days of worsening confusion, productive cough and fever Fever up to 102 in the ED with tachypnea and relative hypotension but no evidence of shock --Complicated by encephalopathy and AKI --Source is COVID-19 infection with possible underlying aspiration pneumonia/CAP --Management of CAP and COVID-19 as noted below --Blood cultures negative --received IV fluids as ordered   CAP (community acquired pneumonia) --Positive for COVID-19 --Chest x-ray demonstrates opacity seen in the right upper lobe --This is concerning for superimposed aspiration pneumonia vs. COVID-associated PNA Rocephin and Doxycycline ordered -- seen by speech therapist. -- Assess for home oxygen. According to RN patient dip down 88 on room air recovered with 2 L to 93 to 94%.--Home oxygen set up per TOC   COVID-19 virus infection --Patient has been symptomatic for approximately 2-3 days with  productive cough and fever +COVID with PNA --Given high risk for complications, completed Remdesivir --Tessalon Perles as needed for cough   Acute kidney injury superimposed on chronic kidney disease (HCC) --Patient with mild baseline compromised renal function, stage 3a  --Creatinine on admission increased at least 1.5 times compared to baseline and presumed to have occurred within the last 7 days --Likely due to prerenal secondary to severe dehydration from infection --creat back to 1.1   Lewy body dementia (HCC) --History of Lewy body dementia, currently on Aricept.   --Patient's wife states at baseline, he is usually quite verbal and oriented. --No longer taking Aricept --Acute encephalopathy on presentation appears to have resolved and patient is back at baseline   Coronary artery disease involving native coronary artery of native heart without angina pectoris --No current c/o CP   PSVT (paroxysmal supraventricular tachycardia) (HCC) --Resume home diltiazem and amiodarone   Essential hypertension  --resumed BP meds   Overall improving. Discussed d/c plans with wife    Family communication :wife on phone Consults :none CODE STATUS: FULL DVT Prophylaxis :lovenox     Pain control - [place] Controlled Substance Reporting System database was reviewed. and patient was instructed, not to drive, operate heavy machinery, perform activities at heights, swimming or participation in water activities or provide baby-sitting services while on Pain, Sleep and Anxiety Medications; until their outpatient Physician has advised to do so again. Also recommended to not to take more than prescribed Pain, Sleep and Anxiety Medications.  Disposition: Home health Diet recommendation:  Discharge Diet Orders (From admission, onward)     Start     Ordered  05/27/23 0000  Diet - low sodium heart healthy        05/27/23 0956           Cardiac diet DISCHARGE MEDICATION: Allergies  as of 05/27/2023   No Known Allergies      Medication List     TAKE these medications    amiodarone 200 MG tablet Commonly known as: PACERONE Take 0.5 tablets (100 mg total) by mouth daily. PLEASE CALL [phone] TO SCHEDULE YEARLY APPOINTMENT. THANK YOU.   camphor-menthol lotion Commonly known as: SARNA Apply 1 application topically as needed for itching (dry skin).   CENTRUM SILVER ADULT 50+ PO Take 1 tablet by mouth daily.   cephALEXin 500 MG capsule Commonly known as: KEFLEX Take 1 capsule (500 mg total) by mouth every 12 (twelve) hours for 5 days.   diltiazem 120 MG 24 hr capsule Commonly known as: DILACOR XR Take 120 mg by mouth daily.   doxycycline 100 MG tablet Commonly known as: VIBRA-TABS Take 1 tablet (100 mg total) by mouth every 12 (twelve) hours for 3 days.   eszopiclone 2 MG Tabs tablet Commonly known as: LUNESTA Take 2 mg by mouth at bedtime. Take immediately before bedtime   gabapentin 300 MG capsule Commonly known as: NEURONTIN Take 600 mg by mouth at bedtime.   levothyroxine 50 MCG tablet Commonly known as: SYNTHROID Take 50 mcg by mouth daily before breakfast.   losartan 50 MG tablet Commonly known as: COZAAR Take 50 mg by mouth daily.   omeprazole 20 MG capsule Commonly known as: PRILOSEC TAKE 1 CAPSULE BY MOUTH EVERY DAY AS NEEDED. What changed:  how much to take how to take this when to take this reasons to take this   oxyCODONE-acetaminophen 10-325 MG tablet Commonly known as: PERCOCET Take 1 tablet by mouth 3 (three) times daily as needed for pain.   Polyethyl Glycol-Propyl Glycol 0.4-0.3 % Soln Place 1 drop into both eyes 4 (four) times daily.   potassium chloride 10 MEQ tablet Commonly known as: KLOR-CON Take 20 mEq by mouth 2 (two) times daily.   rOPINIRole 0.5 MG tablet Commonly known as: REQUIP Take 0.5 tablets (0.25 mg total) by mouth at bedtime.   traZODone 50 MG tablet Commonly known as: DESYREL Take 1 tablet  (50 mg total) by mouth at bedtime.               Durable Medical Equipment  (From admission, onward)           Start     Ordered   05/26/23 1621  For home use only DME Walker rolling  Once       Question Answer Comment  Walker: With 5 Inch Wheels   Patient needs a walker to treat with the following condition Weakness      05/26/23 1621   05/26/23 1603  For home use only DME oxygen  Once       Question Answer Comment  Length of Need 6 Months   Mode or (Route) Nasal cannula   Liters per Minute 2   Frequency Continuous (stationary and portable oxygen unit needed)   Oxygen conserving device Yes   Oxygen delivery system Gas      05/26/23 1602            Follow-up Information     Dorothey Baseman, MD. Schedule an appointment as soon as possible for a visit in 1 week(s).   Specialty: Family Medicine Why: Hospital f/u Contact information: [address] [place]  [address] [place] [address] [phone]                Discharge Exam: Ceasar Mons Weights   05/24/23 1851  Weight: 85.3 kg   GENERAL:  89 y.o.-year-old patient with no acute distress.  LUNGS:decreased breath sounds bilaterally, no wheezing CARDIOVASCULAR: S1, S2 normal. No murmur   ABDOMEN: Soft, nontender, nondistended.  EXTREMITIES: No  edema b/l.    NEUROLOGIC: nonfocal  patient is alert and awake  Condition at discharge: fair  The results of significant diagnostics from this hospitalization (including imaging, microbiology, ancillary and laboratory) are listed below for reference.   Imaging Studies: DG Chest Port 1 View  Result Date: 05/24/2023 CLINICAL DATA:  Productive cough, altered mental status. EXAM: PORTABLE CHEST 1 VIEW COMPARISON:  November 12, 2022. FINDINGS: Stable cardiomediastinal silhouette. Right upper lobe airspace opacity is noted concerning for pneumonia. Bibasilar opacities are noted concerning for pneumonia or atelectasis. Small left pleural effusion may be present.  Bony thorax is unremarkable. IMPRESSION: Right upper lobe opacity concerning for pneumonia. Mild bibasilar opacities are noted concerning for pneumonia or atelectasis. Small left pleural effusion. Followup PA and lateral chest X-ray is recommended in 3-4 weeks following trial of antibiotic therapy to ensure resolution and exclude underlying malignancy. Electronically Signed   By: Lupita Raider M.D.   On: 05/24/2023 15:23    Microbiology: Results for orders placed or performed during the hospital encounter of 05/24/23  Culture, blood (Routine x 2)     Status: None (Preliminary result)   Collection Time: 05/24/23  2:02 PM   Specimen: BLOOD  Result Value Ref Range Status   Specimen Description BLOOD BLOOD LEFT ARM  Final   Special Requests   Final    BOTTLES DRAWN AEROBIC AND ANAEROBIC Blood Culture results may not be optimal due to an excessive volume of blood received in culture bottles   Culture   Final    NO GROWTH 3 DAYS Performed at [facility], [address], [place], [place] [address]    Report Status PENDING  Incomplete  Culture, blood (Routine x 2)     Status: None (Preliminary result)   Collection Time: 05/24/23  2:31 PM   Specimen: BLOOD  Result Value Ref Range Status   Specimen Description BLOOD BLOOD LEFT HAND  Final   Special Requests   Final    BOTTLES DRAWN AEROBIC AND ANAEROBIC Blood Culture results may not be optimal due to an excessive volume of blood received in culture bottles   Culture   Final    NO GROWTH 3 DAYS Performed at [facility], [address], [place], [place] [address]    Report Status PENDING  Incomplete  SARS Coronavirus 2 by RT PCR (hospital order, performed in [facility] hospital lab) *cepheid single result test* Anterior Nasal Swab     Status: Abnormal   Collection Time: 05/24/23  2:31 PM   Specimen: Anterior Nasal Swab  Result Value Ref Range Status   SARS Coronavirus 2 by RT PCR POSITIVE (A) NEGATIVE Final    Comment:  (NOTE) SARS-CoV-2 target nucleic acids are DETECTED  SARS-CoV-2 RNA is generally detectable in upper respiratory specimens  during the acute phase of infection.  Positive results are indicative  of the presence of the identified virus, but do not rule out bacterial infection or co-infection with other pathogens not detected by the test.  Clinical correlation with patient history and  other diagnostic information is necessary to determine  patient infection status.  The expected result is negative.  Fact Sheet for Patients:   RoadLapTop.co.za   Fact Sheet for Healthcare Providers:   http://kim-miller.com/    This test is not yet approved or cleared by the [place] FDA and  has been authorized for detection and/or diagnosis of SARS-CoV-2 by FDA under an Emergency Use Authorization (EUA).  This EUA will remain in effect (meaning this test can be used) for the duration of  the COVID-19 declaration under Section 564(b)(1)  of the Act, 21 U.S.C. section 360-bbb-3(b)(1), unless the authorization is terminated or revoked sooner.   Performed at [facility], [address] [place], [place] [address]     Labs: CBC: Recent Labs  Lab 05/24/23 1402 05/25/23 0536 05/26/23 0622  WBC 4.6 3.2* 3.4*  NEUTROABS 3.8  --  2.1  HGB 10.5* 10.2* 9.7*  HCT 32.2* 30.4* 28.9*  MCV 101.6* 98.1 97.3  PLT 165 133* 150   Basic Metabolic Panel: Recent Labs  Lab 05/24/23 1402 05/25/23 0536 05/26/23 0622 05/27/23 0744  NA 134* 138 139  --   K 3.3* 3.4* 3.2* 3.6  CL 100 104 104  --   CO2 23 24 26   --   GLUCOSE 76 79 85  --   BUN 39* 32* 26*  --   CREATININE 1.63* 1.50* 1.18  --   CALCIUM 8.4* 8.1* 8.0*  --   MG  --  2.2  --   --    Liver Function Tests: Recent Labs  Lab 05/24/23 1402  AST 35  ALT 23  ALKPHOS 38  BILITOT 0.8  PROT 6.0*  ALBUMIN 3.3*   CBG: No results for input(s): "GLUCAP" in the last 168  hours.  Discharge time spent: greater than 30 minutes.  Signed: Enedina Finner, MD [facility] 05/27/2023

## 2023-05-27 NOTE — Consult Note (Signed)
PHARMACY CONSULT NOTE - ELECTROLYTES  Pharmacy Consult for Electrolyte Monitoring and Replacement   Recent Labs: Height: 5\' 10"  (177.8 cm) Weight: 85.3 kg (188 lb) IBW/kg (Calculated) : 73 Estimated Creatinine Clearance: 43.8 mL/min (by C-G formula based on SCr of 1.18 mg/dL). Potassium (mmol/L)  Date Value  05/27/2023 3.6   Magnesium (mg/dL)  Date Value  16/06/9603 2.2   Calcium (mg/dL)  Date Value  54/05/8118 8.0 (L)   Albumin (g/dL)  Date Value  14/78/2956 3.3 (L)   Phosphorus (mg/dL)  Date Value  21/30/8657 3.0   Sodium (mmol/L)  Date Value  05/26/2023 139   Corrected Ca: 8.9 mg/dL  Assessment  Michael [name] is a 89 y.o. male presenting with AMS, productive cough, fever. PMH significant for CKD, CAD, PSVT. Pharmacy has been consulted to monitor and replace electrolytes.  Diet: regular  MIVF: LR @ 75 mL/hr Pertinent medications: amiodarone  Goal of Therapy:  K 4, Mag 2 given cardiac history All other electrolytes WNL  Plan:  K 3.6 will order KCL 20 meq po x 1 Check K with AM labs  Thank you for allowing pharmacy to be a part of this patient's care.  Angelique Blonder, PharmD Clinical Pharmacist 05/27/2023 10:44 AM

## 2023-05-27 NOTE — TOC Transition Note (Signed)
Transition of Care [facility]) - CM/SW Discharge Note   Patient Details  Name: Michael [name] MRN: [id number] Date of Birth: 09-Apr-1932  [facility]) CM/SW Contact:  Liliana Cline, LCSW Phone Number: 05/27/2023, 12:10 PM   Clinical Narrative:    Patient to discharge home today.  Spoke with spouse via phone. She is agreeable to [facility]. Confirmed address in chart is correct. Patient had [facility] in the past and they are agreeable to use [facility] again - they would like to use the Therapist Thayer Ohm if possible. Referral made to Sarah with [facility].    Final next level of care: Home w Home Health Services Barriers to Discharge: Barriers Resolved   Patient Goals and CMS Choice CMS Medicare.gov Compare Post Acute Care list provided to:: Patient Represenative (must comment) Choice offered to / list presented to : Spouse  Discharge Placement                    Name of family member notified: [name] Patient and family notified of of transfer: 05/27/23  Discharge Plan and Services Additional resources added to the After Visit Summary for                            [facility] Arranged: PT, OT HH Agency: Other - See comment [facility]) Date [facility] Agency Contacted: 05/27/23   Representative spoke with at [facility] Agency: Maralyn Sago  Social Determinants of Health (SDOH) Interventions SDOH Screenings   Food Insecurity: No Food Insecurity (05/25/2023)  Housing: Low Risk  (05/25/2023)  Transportation Needs: No Transportation Needs (05/25/2023)  Utilities: Not At Risk (05/25/2023)  Depression (PHQ2-9): Low Risk  (05/11/2020)  Financial Resource Strain: Low Risk  (05/09/2023)   [facility] from [facility]  Tobacco Use: Low Risk  (05/09/2023)   Received from [facility]     Readmission Risk Interventions    05/27/2023   12:08 PM  Readmission Risk Prevention Plan  Transportation Screening Complete  PCP or Specialist Appt within 5-7 Days Complete  Home Care  Screening Complete  Medication Review (RN CM) Complete

## 2023-05-27 NOTE — TOC Progression Note (Signed)
Transition of Care [facility]) - Progression Note    Patient Details  Name: Michael [name] MRN: [id number] Date of Birth: [birth year]  Transition [facility]) CM/SW Contact  Liliana Cline, LCSW Phone Number: 05/27/2023, 11:08 AM  Clinical Narrative:    Attempted to reach spouse x 2 to discuss HH. Awaiting return call. Confirmed with RN that home o2 was delivered to bedside already.        Expected Discharge Plan and Services         Expected Discharge Date: 05/27/23                                     Social Determinants of Health (SDOH) Interventions SDOH Screenings   Food Insecurity: No Food Insecurity (05/25/2023)  Housing: Low Risk  (05/25/2023)  Transportation Needs: No Transportation Needs (05/25/2023)  Utilities: Not At Risk (05/25/2023)  Depression (PHQ2-9): Low Risk  (05/11/2020)  Financial Resource Strain: Low Risk  (05/09/2023)   Received from [facility]  Tobacco Use: Low Risk  (05/09/2023)   Received from [facility]    Readmission Risk Interventions     No data to display

## 2023-05-29 LAB — CULTURE, BLOOD (ROUTINE X 2)
Culture: NO GROWTH
Culture: NO GROWTH

## 2023-06-24 ENCOUNTER — Other Ambulatory Visit: Payer: Self-pay | Admitting: Physician Assistant

## 2023-07-06 ENCOUNTER — Ambulatory Visit: Payer: Medicare Other | Admitting: Neurology

## 2023-07-16 NOTE — Progress Notes (Unsigned)
Cardiology Office Note  Date:  07/17/2023   ID:  Michael [name] 1932-02-17, MRN [id number]  PCP:  Michael Baseman, MD   Chief Complaint  Patient presents with   12 month follow up     "Doing well." Medications reviewed by the patient verbally.     HPI:  [name] is a pleasant 89 year old male with past medical history of Three-vessel CAD on CT scan Moderate diffuse aortic athero total knee replacement,  renal dysfunction,  SVT previously seen by Michael [name], plan was made for SVT ablation. in the end he changed his mind and canceled the procedure,  Insomnia, nephropathy, chronic back pain, pain meds Chronic leg swelling He presents today for follow-up of his SVT and CAD/aortic atherosclerosis  LOV 11/23  Recent events discussed Hospitalized with [facility] sept 2024, records reviewed  In follow-up reports his current regiment low-dose amiodarone 100 daily with diltiazem ER 120 daily he is controlling his tachycardia Denies SVT episodes  Stopped lovastatin on his own after talking to their son, concerned about potential side effects even though he was not having any  Reports having continued neuropathy, gabapentin stopped by his physicians Walks with walker, leg jerking at night Poor sleep Nocturia, would like to see urology  Hard of hearing   04/25/2022 noting heart rates in the 40s bpm with BP in the 140s to 150s systolic  Seen in office 05/02/22 Amiodarone down to 100 daily, diltiazem dosing reduced 120 daily  Lab work reviewed Total cholesterol 176 LDL 100 on lovastatin (now stopped) Hemoglobin 11.3 Creatinine 1.7 BUN 30  Chronic insomnia/poor sleep hygiene  followed by neurology and neurosurgery  EKG personally reviewed by myself on todays visit EKG Interpretation Date/Time:  Monday July 17 2023 13:53:58 EST Ventricular Rate:  64 PR Interval:  208 QRS Duration:  96 QT Interval:  466 QTC Calculation: 480 R Axis:   11  Text Interpretation: Normal sinus  rhythm Prolonged QT When compared with ECG of 12-Nov-2022 06:37, rate was slowed Confirmed by Michael [name] [phone]) on 07/17/2023 1:58:35 PM   Chronic stable lower extremity swelling, does not wear compression hose No regular exercise program, sedentary, likes to read  EKG personally reviewed by myself on todays visit Normal sinus rhythm rate 66 bpm nonspecific T wave abnormality  Other past medical history reviewed Seen at the [place], told to stop flecainide secondary to "QT" They wanted to place a loop monitor Patient declined  Episode of tachycardia September 2020 Likely with SVT on 05/24/2019, 3 hours,  rate 140, with associated hypotension Symptoms similar to prior SVT episodes Previously declined ablation  CT scan September 2017 for pneumonia reviewed with him in detail extensive coronary calcifications, all 3 coronary vessels , at least moderate diffuse aortic atherosclerosis  Previous 30 day monitor  showed runs of SVT, heart rate up to 177 beats per minute associated with dizziness. Continued to have runs of SVT even on diltiazem 120 mg daily    PMH:   has a past medical history of Anemia, Aortic atherosclerosis (HCC), Arthritis, Aspiration pneumonia (HCC) (2020), BPH (benign prostatic hypertrophy), CAD (coronary artery disease) (05/23/2016), Cellulitis of face (12/21/2019), Chronic back pain, CKD (chronic kidney disease), stage III (HCC), Colon cancer (HCC) (1988), Diastolic dysfunction (05/23/2014), Diverticulosis, Essential hypertension, First degree AV block, GERD (gastroesophageal reflux disease), H/O calcium pyrophosphate deposition disease (CPPD), Hiatal hernia (01/06/2015), History of kidney stones, HLD (hyperlipidemia), HOH (hard of hearing), Hypokalemia, Impotence of organic origin, Incisional hernia (10/2021), Lewy body dementia (HCC), Peritonsillar abscess  determined by examination (05/15/2019), PSVT (paroxysmal supraventricular tachycardia) (HCC), REM sleep behavior  disorder (02/21/2014), Rotator cuff rupture, Unspecified hearing loss, and Ventral hernia (1988).  PSH:    Past Surgical History:  Procedure Laterality Date   CATARACT EXTRACTION W/ INTRAOCULAR LENS IMPLANT Bilateral    COLONOSCOPY     HEMICOLOECTOMY W/ ANASTOMOSIS  1988   Michael [name]   INGUINAL HERNIA REPAIR Right 1963   INSERTION OF MESH N/A 11/01/2021   Procedure: INSERTION OF MESH;  Surgeon: Michael Shiver, MD;  Location: [facility];  Service: General;  Laterality: N/A;   MASTECTOMY, PARTIAL Left 02/07/2013   LEFT SUBCUTANEOUS MASTECTOMY; Michael Arms. Corliss Skains, MD   ROTATOR CUFF REPAIR Right 2000    Michael [name]   TOTAL HIP ARTHROPLASTY Right 03/24/2020   Procedure: TOTAL HIP ARTHROPLASTY ANTERIOR APPROACH;  Surgeon: Michael Romans, MD;  Location: [facility];  Service: Orthopedics;  Laterality: Right;  70 mins   TOTAL KNEE ARTHROPLASTY  03/05/2012   Procedure: TOTAL KNEE ARTHROPLASTY;  Surgeon: Michael Drilling, MD;  Location: [facility];  Service: Orthopedics;  Laterality: Left;   UMBILICAL HERNIA REPAIR  1988   Michael [name], x 4   VEIN LIGATION AND STRIPPING Right    XI ROBOTIC ASSISTED VENTRAL HERNIA N/A 11/01/2021   Procedure: XI ROBOTIC ASSISTED VENTRAL HERNIA;  Surgeon: Michael Shiver, MD;  Location: [facility];  Service: General;  Laterality: N/A;    Current Outpatient Medications  Medication Sig Dispense Refill   amiodarone (PACERONE) 200 MG tablet Take 0.5 tablets (100 mg total) by mouth daily. 15 tablet 0   camphor-menthol (SARNA) lotion Apply 1 application topically as needed for itching (dry skin).     diltiazem (DILACOR XR) 120 MG 24 hr capsule Take 120 mg by mouth daily.     eszopiclone (LUNESTA) 2 MG TABS tablet Take 2 mg by mouth at bedtime. Take immediately before bedtime     levothyroxine (SYNTHROID) 50 MCG tablet Take 50 mcg by mouth daily before breakfast.     losartan (COZAAR) 50 MG tablet Take 50 mg by mouth daily.     Multiple Vitamins-Minerals (CENTRUM  SILVER ADULT 50+ PO) Take 1 tablet by mouth daily.     omeprazole (PRILOSEC) 20 MG capsule TAKE 1 CAPSULE BY MOUTH EVERY DAY AS NEEDED. 90 capsule 3   oxyCODONE-acetaminophen (PERCOCET) 10-325 MG tablet Take 1 tablet by mouth 3 (three) times daily as needed for pain.     Polyethyl Glycol-Propyl Glycol 0.4-0.3 % SOLN Place 1 drop into both eyes 4 (four) times daily.     potassium chloride (KLOR-CON) 10 MEQ tablet Take 20 mEq by mouth 2 (two) times daily.     pregabalin (LYRICA) 50 MG capsule Take 50 mg by mouth 2 (two) times daily.     rOPINIRole (REQUIP) 0.5 MG tablet Take 0.5 tablets (0.25 mg total) by mouth at bedtime. 15 tablet 0   tiZANidine (ZANAFLEX) 2 MG tablet Take 2-4 mg by mouth at bedtime as needed.     gabapentin (NEURONTIN) 300 MG capsule Take 600 mg by mouth at bedtime. (Patient not taking: Reported on 07/17/2023)     traZODone (DESYREL) 50 MG tablet Take 1 tablet (50 mg total) by mouth at bedtime. (Patient not taking: Reported on 07/17/2023) 30 tablet 0   No current facility-administered medications for this visit.    Allergies:   Patient has no known allergies.   Social History:  The patient  reports that he has never smoked. He has never used smokeless tobacco.  He reports current alcohol use of about 1.0 standard drink of alcohol per week. He reports that he does not use drugs.   Family History:   family history includes Congestive Heart Failure in his mother; Osteoarthritis in his father.   Review of Systems: Review of Systems  Constitutional: Negative.   HENT: Negative.    Respiratory: Negative.    Cardiovascular: Negative.   Gastrointestinal: Negative.   Musculoskeletal: Negative.   Neurological: Negative.   Psychiatric/Behavioral:  The patient has insomnia.   All other systems reviewed and are negative.  PHYSICAL EXAM: VS:  Ht 5\' 9"  (1.753 m)   Wt 181 lb (82.1 kg)   SpO2 95%   BMI 26.73 kg/m  , BMI Body mass index is 26.73 kg/m. Constitutional:  oriented to  person, place, and time. No distress.  HENT:  Head: Grossly normal Eyes:  no discharge. No scleral icterus.  Neck: No JVD, no carotid bruits  Cardiovascular: Regular rate and rhythm, no murmurs appreciated Pulmonary/Chest: Clear to auscultation bilaterally, no wheezes or rails Abdominal: Soft.  no distension.  no tenderness.  Musculoskeletal: Normal range of motion Neurological:  normal muscle tone. Coordination normal. No atrophy Skin: Skin warm and dry Psychiatric: normal affect, pleasant  Recent Labs: 11/12/2022: B Natriuretic Peptide 210.1 05/24/2023: ALT 23 05/25/2023: Magnesium 2.2 05/26/2023: BUN 26; Creatinine, Ser 1.18; Hemoglobin 9.7; Platelets 150; Sodium 139 05/27/2023: Potassium 3.6    Lipid Panel Lab Results  Component Value Date   CHOL 138 05/07/2020   HDL 64.30 05/07/2020   LDLCALC 54 05/07/2020   TRIG 97.0 05/07/2020      Wt Readings from Last 3 Encounters:  07/17/23 181 lb (82.1 kg)  05/24/23 188 lb (85.3 kg)  11/12/22 192 lb 14.4 oz (87.5 kg)     ASSESSMENT AND PLAN:  SVT (supraventricular tachycardia) (HCC) - Tolerating low-dose amiodarone, diltiazem ER 120 daily,  denies breakthrough tachyarrhythmia Zio monitor with rare episodes of SVT, longest 18 beats Recommend he continue current medications  Coronary artery disease involving native coronary artery of native heart without angina pectoris Heavy coronary calcification seen on CT scan Low risk stress test August 2018 Reports that after he talked with his son, he stopped lovastatin From cardiac perspective, would recommend he restart lovastatin if not lovastatin with Zetia to achieve goal LDL less than 68 Wife will call us if she would like to restart lovastatin  Aortic atherosclerosis (HCC) Moderate diffuse disease on CT scan September 2017 Recommended he restart lovastatin, he will call us if interested  Essential hypertension Chlorthalidone stopped by nephrology Blood pressure stable 130  systolic  Insomnia/chronic leg pain, neuropathy No longer takes gabapentin Has tried various medications for insomnia Reports leg jerking, chronic pain Sedentary at baseline  Orders Placed This Encounter  Procedures   EKG 12-Lead     Signed, Dossie Arbour, M.D., Ph.D. 07/17/2023  [facility] [place], [place] [phone]

## 2023-07-17 ENCOUNTER — Encounter: Payer: Self-pay | Admitting: Cardiovascular Disease

## 2023-07-17 ENCOUNTER — Ambulatory Visit: Payer: Medicare Other | Attending: Cardiovascular Disease | Admitting: Cardiovascular Disease

## 2023-07-17 VITALS — BP 130/70 | HR 64 | Ht 69.0 in | Wt 181.0 lb

## 2023-07-17 DIAGNOSIS — I471 Supraventricular tachycardia, unspecified: Secondary | ICD-10-CM

## 2023-07-17 DIAGNOSIS — N1832 Chronic kidney disease, stage 3b: Secondary | ICD-10-CM

## 2023-07-17 DIAGNOSIS — I700 Atherosclerosis of aorta: Secondary | ICD-10-CM

## 2023-07-17 DIAGNOSIS — I10 Essential (primary) hypertension: Secondary | ICD-10-CM

## 2023-07-17 DIAGNOSIS — I2510 Atherosclerotic heart disease of native coronary artery without angina pectoris: Secondary | ICD-10-CM

## 2023-07-17 DIAGNOSIS — R001 Bradycardia, unspecified: Secondary | ICD-10-CM | POA: Diagnosis not present

## 2023-07-17 DIAGNOSIS — E785 Hyperlipidemia, unspecified: Secondary | ICD-10-CM

## 2023-07-17 MED ORDER — DILTIAZEM HCL ER 120 MG PO CP24: 120.0000 mg | ORAL_CAPSULE | Freq: Every day | ORAL | 3 refills | Status: AC

## 2023-07-17 MED ORDER — LOSARTAN POTASSIUM 50 MG PO TABS: 50.0000 mg | ORAL_TABLET | Freq: Every day | ORAL | 3 refills | Status: AC

## 2023-07-17 MED ORDER — AMIODARONE HCL 200 MG PO TABS: 100.0000 mg | ORAL_TABLET | Freq: Every day | ORAL | 3 refills | Status: DC

## 2023-07-17 NOTE — Patient Instructions (Signed)

## 2023-08-09 ENCOUNTER — Ambulatory Visit: Payer: Medicare Other | Admitting: Urology

## 2023-08-09 ENCOUNTER — Encounter: Payer: Self-pay | Admitting: Urology

## 2023-08-09 VITALS — BP 161/67 | HR 67 | Ht 69.0 in | Wt 187.8 lb

## 2023-08-09 DIAGNOSIS — R350 Frequency of micturition: Secondary | ICD-10-CM

## 2023-08-09 DIAGNOSIS — R351 Nocturia: Secondary | ICD-10-CM | POA: Diagnosis not present

## 2023-08-09 LAB — URINALYSIS, COMPLETE
Bilirubin, UA: NEGATIVE
Glucose, UA: NEGATIVE
Ketones, UA: NEGATIVE
Leukocytes,UA: NEGATIVE
Nitrite, UA: NEGATIVE
Specific Gravity, UA: 1.015 (ref 1.005–1.030)
Urobilinogen, Ur: 0.2 mg/dL (ref 0.2–1.0)
pH, UA: 5.5 (ref 5.0–7.5)

## 2023-08-09 LAB — MICROSCOPIC EXAMINATION

## 2023-08-09 LAB — BLADDER SCAN AMB NON-IMAGING: Scan Result: 0

## 2023-08-09 MED ORDER — SILODOSIN 8 MG PO CAPS
8.0000 mg | ORAL_CAPSULE | Freq: Every day | ORAL | 0 refills | Status: DC
Start: 2023-08-09 — End: 2023-08-31

## 2023-08-09 NOTE — Progress Notes (Signed)
I, Maysun Anabel Bene, acting as a scribe for Riki Altes, MD., have documented all relevant documentation on the behalf of Riki Altes, MD, as directed by Riki Altes, MD while in the presence of Riki Altes, MD.  08/09/2023 11:59 AM   Michael [name] 04-09-1932 [id number]  Referring provider: Dorothey Baseman, MD [phone] [address] [place],  [place] [address]  Chief Complaint  Patient presents with   Urinary Frequency    HPI: Michael [name] is a 89 y.o. male referred for evaluation of lower urinary tract symptoms. He presents today with his wife and his son, who provided the majority of the history.   Long history of sleep difficulty and nocturia. He typically on average gets up 4-5 times per night to void. He will occasionally have urgency and voiding small amounts with sensation of incomplete emptying.  No bothersome daytime symptoms.  Although he has sleep difficulties, he has had a negative sleep study for sleep apnea.  Denies dysuria, gross hematuria.  No flank, abdominal, or pelvic pain.    PMH: Past Medical History:  Diagnosis Date   Anemia    Aortic atherosclerosis (HCC)    Arthritis    Aspiration pneumonia (HCC) 2020   BPH (benign prostatic hypertrophy)    CAD (coronary artery disease) 05/23/2016   a.) CT 05/23/2016: extensive 3v CAD.   Cellulitis of face 12/21/2019   Chronic back pain    CKD (chronic kidney disease), stage III (HCC)    Colon cancer (HCC) 1988   Surgery alone, no history of chemo   Diastolic dysfunction 05/23/2014   a.) TTE 05/23/2014; EF 63%; G1DD. b.) TTE 10/27/2014: EF 61%, mild LA dilation; G2DD. c.) TTE 04/10/2020: EF >55%. GLS -20.3%. Triv panvalvular regurgitation; no stenosis.   Diverticulosis    Essential hypertension    First degree AV block    GERD (gastroesophageal reflux disease)    H/O calcium pyrophosphate deposition disease (CPPD)    Hiatal hernia 01/06/2015   History of kidney stones    HLD (hyperlipidemia)     HOH (hard of hearing)    bilateral hearing aids   Hypokalemia    Impotence of organic origin    Incisional hernia 10/2021   Lewy body dementia (HCC)    Peritonsillar abscess determined by examination 05/15/2019   S/p abx, I&D by ENT 05/2019   PSVT (paroxysmal supraventricular tachycardia) (HCC)    a. 04/2014 Event Monitor: Freq bouts of SVT with rates to 180-->seen by EP with recommendation for RFCA, pt deferred;  b. 05/2014 Echo: EF nl, mild conc LVH, no rwma, Gr1 DD, mildMR/TR.   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   Rotator cuff rupture    Right; s/p repair   Unspecified hearing loss    Ventral hernia 1988    Surgical History: Past Surgical History:  Procedure Laterality Date   CATARACT EXTRACTION W/ INTRAOCULAR LENS IMPLANT Bilateral    COLONOSCOPY     HEMICOLOECTOMY W/ ANASTOMOSIS  1988   [name]   INGUINAL HERNIA REPAIR Right 1963   INSERTION OF MESH N/A 11/01/2021   Procedure: INSERTION OF MESH;  Surgeon: Carolan Shiver, MD;  Location: [facility];  Service: General;  Laterality: N/A;   MASTECTOMY, PARTIAL Left 02/07/2013   LEFT SUBCUTANEOUS MASTECTOMY; Wilmon Arms. Corliss Skains, MD   ROTATOR CUFF REPAIR Right 2000    [facility]   TOTAL HIP ARTHROPLASTY Right 03/24/2020   Procedure: TOTAL HIP ARTHROPLASTY ANTERIOR APPROACH;  Surgeon: Durene Romans, MD;  Location: [facility];  Service: Orthopedics;  Laterality: Right;  70 mins   TOTAL KNEE ARTHROPLASTY  03/05/2012   Procedure: TOTAL KNEE ARTHROPLASTY;  Surgeon: Loanne Drilling, MD;  Location: [facility];  Service: Orthopedics;  Laterality: Left;   UMBILICAL HERNIA REPAIR  1988   [facility], x 4   VEIN LIGATION AND STRIPPING Right    XI ROBOTIC ASSISTED VENTRAL HERNIA N/A 11/01/2021   Procedure: XI ROBOTIC ASSISTED VENTRAL HERNIA;  Surgeon: Carolan Shiver, MD;  Location: [facility];  Service: General;  Laterality: N/A;    Home Medications:  Allergies as of 08/09/2023   No Known Allergies      Medication List         Accurate as of August 09, 2023 11:59 AM. If you have any questions, ask your nurse or doctor.          STOP taking these medications    traZODone 50 MG tablet Commonly known as: DESYREL Stopped by: Riki Altes       TAKE these medications    amiodarone 200 MG tablet Commonly known as: PACERONE Take 0.5 tablets (100 mg total) by mouth daily.   camphor-menthol lotion Commonly known as: SARNA Apply 1 application topically as needed for itching (dry skin).   CENTRUM SILVER ADULT 50+ PO Take 1 tablet by mouth daily.   diltiazem 120 MG 24 hr capsule Commonly known as: DILACOR XR Take 1 capsule (120 mg total) by mouth daily.   eszopiclone 2 MG Tabs tablet Commonly known as: LUNESTA Take 2 mg by mouth at bedtime. Take immediately before bedtime   gabapentin 300 MG capsule Commonly known as: NEURONTIN Take 600 mg by mouth at bedtime.   levothyroxine 50 MCG tablet Commonly known as: SYNTHROID Take 50 mcg by mouth daily before breakfast.   losartan 50 MG tablet Commonly known as: COZAAR Take 1 tablet (50 mg total) by mouth daily.   omeprazole 20 MG capsule Commonly known as: PRILOSEC TAKE 1 CAPSULE BY MOUTH EVERY DAY AS NEEDED.   oxyCODONE-acetaminophen 10-325 MG tablet Commonly known as: PERCOCET Take 1 tablet by mouth 3 (three) times daily as needed for pain.   Polyethyl Glycol-Propyl Glycol 0.4-0.3 % Soln Place 1 drop into both eyes 4 (four) times daily.   potassium chloride 10 MEQ tablet Commonly known as: KLOR-CON Take 20 mEq by mouth 2 (two) times daily.   pregabalin 50 MG capsule Commonly known as: LYRICA Take 50 mg by mouth 2 (two) times daily.   rOPINIRole 0.5 MG tablet Commonly known as: REQUIP Take 0.5 tablets (0.25 mg total) by mouth at bedtime.   silodosin 8 MG Caps capsule Commonly known as: RAPAFLO Take 1 capsule (8 mg total) by mouth daily with breakfast. Started by: Riki Altes   tiZANidine 2 MG tablet Commonly known  as: ZANAFLEX Take 2-4 mg by mouth at bedtime as needed.        Allergies: No Known Allergies  Family History: Family History  Problem Relation Age of Onset   Osteoarthritis Father    Congestive Heart Failure Mother     Social History:  reports that he has never smoked. He has never used smokeless tobacco. He reports current alcohol use of about 1.0 standard drink of alcohol per week. He reports that he does not use drugs.   Physical Exam: BP (!) 161/67   Pulse 67   Ht 5\' 9"  (1.753 m)   Wt 187 lb 12.8 oz (85.2 kg)   BMI  27.73 kg/m   Constitutional:  Alert and oriented, No acute distress. HEENT: [place] AT Respiratory: Normal respiratory effort, no increased work of breathing. GU: Prostate 40 grams, smooth without nodules or induration.  Psychiatric: Normal mood and affect.   Urinalysis Dipstick trace blood/1+ protein, microscopy negative.    Assessment & Plan:    1. Nocturia PVR today 0 mL Tria silodosin 8 mg daily. If no improvement in his symptoms after 30 days, was instructed to call back and will add Gemtesa. We discussed that nocturia can be a difficult problem to treat and the possibility of nocturnal polyuria with no effective treatments at his age was also discussed.   I have reviewed the above documentation for accuracy and completeness, and I agree with the above.   Riki Altes, MD  [facility] [address] [place], [place] [address] [phone]

## 2023-08-11 ENCOUNTER — Encounter: Payer: Self-pay | Admitting: Urology

## 2023-08-31 ENCOUNTER — Other Ambulatory Visit: Payer: Self-pay | Admitting: Urology

## 2023-08-31 DIAGNOSIS — R351 Nocturia: Secondary | ICD-10-CM

## 2023-11-30 ENCOUNTER — Encounter: Payer: Self-pay | Admitting: *Deleted

## 2023-11-30 ENCOUNTER — Telehealth: Payer: Self-pay | Admitting: Urology

## 2023-11-30 NOTE — Telephone Encounter (Signed)
Patient needs a appt 

## 2023-11-30 NOTE — Telephone Encounter (Signed)
 Pt saw [facility] 07/2023.  He was prescribed Rapaflo.  He is having more frequency and doesn't sleep well.  Wife wants to know if he can try something different, or should he come in for appt.

## 2023-12-20 ENCOUNTER — Encounter: Payer: Self-pay | Admitting: Urology

## 2023-12-20 ENCOUNTER — Ambulatory Visit (INDEPENDENT_AMBULATORY_CARE_PROVIDER_SITE_OTHER): Admitting: Urology

## 2023-12-20 VITALS — BP 130/84 | HR 76 | Ht 71.0 in | Wt 185.0 lb

## 2023-12-20 DIAGNOSIS — R351 Nocturia: Secondary | ICD-10-CM

## 2023-12-20 MED ORDER — GEMTESA 75 MG PO TABS: 75.0000 mg | ORAL_TABLET | Freq: Every day | ORAL | Status: DC

## 2023-12-20 NOTE — Progress Notes (Signed)
 I, Maysun Jamey Mccallum, acting as a scribe for Geraline Knapp, MD., have documented all relevant documentation on the behalf of Geraline Knapp, MD, as directed by Geraline Knapp, MD while in the presence of Geraline Knapp, MD.  12/20/2023 10:30 PM   Michael [name] [birth year] [id number]  Referring provider: Rory Collard, MD [phone] [place],  [place] [address]  Chief Complaint  Patient presents with   Other    nocturia    HPI: Michael [name] is a 89 y.o. male presents for follow-up. His son was with him today.  Initially seen 08/09/23 for lower urinary tract symptoms, primarily nocturia x4-5.  He was initially given a trial of silodosin. He still gets up at night, however his son states he has significant neuropathy and Lewy body dementia. He gets more relief from his neuropathy when upright, and he thinks his getting up at night may have more to do with his neuropathy and dementia rather than the need to void, and he just voids when he is up. His son states he has heard him void and he has a strong stream, does not appear to have any difficulties. His wife had called to discuss alternative medications.   PMH: Past Medical History:  Diagnosis Date   Anemia    Aortic atherosclerosis (HCC)    Arthritis    Aspiration pneumonia (HCC) 2020   BPH (benign prostatic hypertrophy)    CAD (coronary artery disease) 05/23/2016   a.) CT 05/23/2016: extensive 3v CAD.   Cellulitis of face 12/21/2019   Chronic back pain    CKD (chronic kidney disease), stage III (HCC)    Colon cancer (HCC) 1988   Surgery alone, no history of chemo   Diastolic dysfunction 05/23/2014   a.) TTE 05/23/2014; EF 63%; G1DD. b.) TTE 10/27/2014: EF 61%, mild LA dilation; G2DD. c.) TTE 04/10/2020: EF >55%. GLS -20.3%. Triv panvalvular regurgitation; no stenosis.   Diverticulosis    Essential hypertension    First degree AV block    GERD (gastroesophageal reflux disease)    H/O calcium pyrophosphate  deposition disease (CPPD)    Hiatal hernia 01/06/2015   History of kidney stones    HLD (hyperlipidemia)    HOH (hard of hearing)    bilateral hearing aids   Hypokalemia    Impotence of organic origin    Incisional hernia 10/2021   Lewy body dementia (HCC)    Peritonsillar abscess determined by examination 05/15/2019   S/p abx, I&D by ENT 05/2019   PSVT (paroxysmal supraventricular tachycardia) (HCC)    a. 04/2014 Event Monitor: Freq bouts of SVT with rates to 180-->seen by EP with recommendation for RFCA, pt deferred;  b. 05/2014 Echo: EF nl, mild conc LVH, no rwma, Gr1 DD, mildMR/TR.   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   Rotator cuff rupture    Right; s/p repair   Unspecified hearing loss    Ventral hernia 1988    Surgical History: Past Surgical History:  Procedure Laterality Date   CATARACT EXTRACTION W/ INTRAOCULAR LENS IMPLANT Bilateral    COLONOSCOPY     HEMICOLOECTOMY W/ ANASTOMOSIS  1988   [name]   INGUINAL HERNIA REPAIR Right 1963   INSERTION OF MESH N/A 11/01/2021   Procedure: INSERTION OF MESH;  Surgeon: Eldred Grego, MD;  Location: [facility];  Service: General;  Laterality: N/A;   MASTECTOMY, PARTIAL Left 02/07/2013   LEFT SUBCUTANEOUS MASTECTOMY; Kari Otto. Eli Grizzle, MD   ROTATOR CUFF  REPAIR Right 2000    [facility]   TOTAL HIP ARTHROPLASTY Right 03/24/2020   Procedure: TOTAL HIP ARTHROPLASTY ANTERIOR APPROACH;  Surgeon: Claiborne Crew, MD;  Location: [facility];  Service: Orthopedics;  Laterality: Right;  70 mins   TOTAL KNEE ARTHROPLASTY  03/05/2012   Procedure: TOTAL KNEE ARTHROPLASTY;  Surgeon: Aurther Blue, MD;  Location: [facility];  Service: Orthopedics;  Laterality: Left;   UMBILICAL HERNIA REPAIR  1988   [facility], x 4   VEIN LIGATION AND STRIPPING Right    XI ROBOTIC ASSISTED VENTRAL HERNIA N/A 11/01/2021   Procedure: XI ROBOTIC ASSISTED VENTRAL HERNIA;  Surgeon: Eldred Grego, MD;  Location: [facility];  Service: General;   Laterality: N/A;    Home Medications:  Allergies as of 12/20/2023   No Known Allergies      Medication List        Accurate as of December 20, 2023 10:30 PM. If you have any questions, ask your nurse or doctor.          amiodarone 200 MG tablet Commonly known as: PACERONE Take 0.5 tablets (100 mg total) by mouth daily.   camphor-menthol lotion Commonly known as: SARNA Apply 1 application topically as needed for itching (dry skin).   CENTRUM SILVER ADULT 50+ PO Take 1 tablet by mouth daily.   diltiazem 120 MG 24 hr capsule Commonly known as: DILACOR XR Take 1 capsule (120 mg total) by mouth daily.   eszopiclone 2 MG Tabs tablet Commonly known as: LUNESTA Take 2 mg by mouth at bedtime. Take immediately before bedtime   gabapentin 300 MG capsule Commonly known as: NEURONTIN Take 600 mg by mouth at bedtime.   Gemtesa 75 MG Tabs Generic drug: Vibegron Take 1 tablet (75 mg total) by mouth daily at 6 (six) AM. Started by: Geraline Knapp   levothyroxine 50 MCG tablet Commonly known as: SYNTHROID Take 50 mcg by mouth daily before breakfast.   losartan 50 MG tablet Commonly known as: COZAAR Take 1 tablet (50 mg total) by mouth daily.   omeprazole 20 MG capsule Commonly known as: PRILOSEC TAKE 1 CAPSULE BY MOUTH EVERY DAY AS NEEDED.   oxyCODONE-acetaminophen 10-325 MG tablet Commonly known as: PERCOCET Take 1 tablet by mouth 3 (three) times daily as needed for pain.   Polyethyl Glycol-Propyl Glycol 0.4-0.3 % Soln Place 1 drop into both eyes 4 (four) times daily.   potassium chloride 10 MEQ tablet Commonly known as: KLOR-CON Take 20 mEq by mouth 2 (two) times daily.   pregabalin 50 MG capsule Commonly known as: LYRICA Take 50 mg by mouth 2 (two) times daily.   rOPINIRole 0.5 MG tablet Commonly known as: REQUIP Take 0.5 tablets (0.25 mg total) by mouth at bedtime.   silodosin 8 MG Caps capsule Commonly known as: RAPAFLO TAKE 1 CAPSULE BY MOUTH DAILY WITH  BREAKFAST.   tiZANidine 2 MG tablet Commonly known as: ZANAFLEX Take 2-4 mg by mouth at bedtime as needed.        Allergies: No Known Allergies  Family History: Family History  Problem Relation Age of Onset   Osteoarthritis Father    Congestive Heart Failure Mother     Social History:  reports that he has never smoked. He has never used smokeless tobacco. He reports current alcohol use of about 1.0 standard drink of alcohol per week. He reports that he does not use drugs.   Physical Exam: BP 130/84   Pulse 76   Ht 5\' 11"  (  1.803 m)   Wt 185 lb (83.9 kg)   BMI 25.80 kg/m   Constitutional:  Alert and oriented, No acute distress. HEENT: [place] AT Respiratory: Normal respiratory effort, no increased work of breathing. Psychiatric: Normal mood and affect.   Assessment & Plan:    1. Nocturia The patient's son believes the nocturia may be related to neuropathy and dementia rather than a need to void. The patient voids when he is up, and his son reports a strong stream with no apparent difficulties. A trial of Gemtesa for 28 days is recommended to assess its impact on nocturia. Nocturnal polyuria was previously discussed, but due to the patient's age, DDAVP is not recommended. The family will call back to report on the efficacy of Gemtesa.  I have reviewed the above documentation for accuracy and completeness, and I agree with the above.   Geraline Knapp, MD  [facility] [address], [address] [place], [place] [address] [phone]

## 2023-12-21 ENCOUNTER — Ambulatory Visit: Admitting: Urology

## 2024-01-23 ENCOUNTER — Encounter: Payer: Self-pay | Admitting: Urology

## 2024-01-23 ENCOUNTER — Ambulatory Visit (INDEPENDENT_AMBULATORY_CARE_PROVIDER_SITE_OTHER): Admitting: Urology

## 2024-01-23 VITALS — BP 162/84 | HR 57 | Ht 70.0 in | Wt 165.0 lb

## 2024-01-23 DIAGNOSIS — R3989 Other symptoms and signs involving the genitourinary system: Secondary | ICD-10-CM | POA: Diagnosis not present

## 2024-01-23 DIAGNOSIS — R351 Nocturia: Secondary | ICD-10-CM | POA: Diagnosis not present

## 2024-01-23 LAB — URINALYSIS, COMPLETE
Bilirubin, UA: NEGATIVE
Glucose, UA: NEGATIVE
Ketones, UA: NEGATIVE
Leukocytes,UA: NEGATIVE
Nitrite, UA: NEGATIVE
RBC, UA: NEGATIVE
Specific Gravity, UA: 1.01 (ref 1.005–1.030)
Urobilinogen, Ur: 0.2 mg/dL (ref 0.2–1.0)
pH, UA: 7 (ref 5.0–7.5)

## 2024-01-23 LAB — MICROSCOPIC EXAMINATION: Epithelial Cells (non renal): >10 /HPF — AB (ref 0–10)

## 2024-01-23 LAB — BLADDER SCAN AMB NON-IMAGING

## 2024-01-23 MED ORDER — CEFUROXIME AXETIL 250 MG PO TABS
250.0000 mg | ORAL_TABLET | Freq: Two times a day (BID) | ORAL | 0 refills | Status: AC
Start: 2024-01-23 — End: 2024-01-30

## 2024-01-23 NOTE — Progress Notes (Signed)
 01/23/2024 2:31 PM   Michael [name] 08/08/1932 [id number]  Referring provider: Rory Collard, MD [phone] [place],  [place] [address]  Urological history: 1. Nocturia  -Rapaflo  8 mg daily and Gemtesa  75 mg daily -negative sleep study   Chief Complaint  Patient presents with   Urinary Tract Infection   HPI: Michael [name] is a 89 y.o. man who presents today for pain with urination with his wife, [name] and son, [name].    Previous records reviewed.   Patient is a difficult historian.  History is also obtained from wife and son.  He states for last 2 and half weeks he has been having dysuria and suprapubic discomfort.  Patient denies any modifying or aggravating factors.  Patient denies any recent UTI's, gross hematuria or flank pain.  Patient denies any fevers, chills, nausea or vomiting.    UA yellow clear, specific area 1.010, pH 7.0, 2+ protein, 0-5 WBCs, 0-2 RBCs, greater than 10 epithelial cells and many bacteria.  He is also taking the Gemtesa  have not seen any improvement in his nocturia.  PMH: Past Medical History:  Diagnosis Date   Anemia    Aortic atherosclerosis (HCC)    Arthritis    Aspiration pneumonia (HCC) 2020   BPH (benign prostatic hypertrophy)    CAD (coronary artery disease) 05/23/2016   a.) CT 05/23/2016: extensive 3v CAD.   Cellulitis of face 12/21/2019   Chronic back pain    CKD (chronic kidney disease), stage III (HCC)    Colon cancer (HCC) 1988   Surgery alone, no history of chemo   Diastolic dysfunction 05/23/2014   a.) TTE 05/23/2014; EF 63%; G1DD. b.) TTE 10/27/2014: EF 61%, mild LA dilation; G2DD. c.) TTE 04/10/2020: EF >55%. GLS -20.3%. Triv panvalvular regurgitation; no stenosis.   Diverticulosis    Essential hypertension    First degree AV block    GERD (gastroesophageal reflux disease)    H/O calcium  pyrophosphate deposition disease (CPPD)    Hiatal hernia 01/06/2015   History of kidney stones    HLD (hyperlipidemia)     HOH (hard of hearing)    bilateral hearing aids   Hypokalemia    Impotence of organic origin    Incisional hernia 10/2021   Lewy body dementia (HCC)    Peritonsillar abscess determined by examination 05/15/2019   S/p abx, I&D by ENT 05/2019   PSVT (paroxysmal supraventricular tachycardia) (HCC)    a. 04/2014 Event Monitor: Freq bouts of SVT with rates to 180-->seen by EP with recommendation for RFCA, pt deferred;  b. 05/2014 Echo: EF nl, mild conc LVH, no rwma, Gr1 DD, mildMR/TR.   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   Rotator cuff rupture    Right; s/p repair   Unspecified hearing loss    Ventral hernia 1988    Surgical History: Past Surgical History:  Procedure Laterality Date   CATARACT EXTRACTION W/ INTRAOCULAR LENS IMPLANT Bilateral    COLONOSCOPY     HEMICOLOECTOMY W/ ANASTOMOSIS  1988   [name]   INGUINAL HERNIA REPAIR Right 1963   INSERTION OF MESH N/A 11/01/2021   Procedure: INSERTION OF MESH;  Surgeon: Eldred Grego, MD;  Location: [facility];  Service: General;  Laterality: N/A;   MASTECTOMY, PARTIAL Left 02/07/2013   LEFT SUBCUTANEOUS MASTECTOMY; Kari Otto. Eli Grizzle, MD   ROTATOR CUFF REPAIR Right 2000    [facility]   TOTAL HIP ARTHROPLASTY Right 03/24/2020   Procedure: TOTAL HIP ARTHROPLASTY ANTERIOR APPROACH;  Surgeon: Bernard Brick,  Zoila Hines, MD;  Location: [facility];  Service: Orthopedics;  Laterality: Right;  70 mins   TOTAL KNEE ARTHROPLASTY  03/05/2012   Procedure: TOTAL KNEE ARTHROPLASTY;  Surgeon: Aurther Blue, MD;  Location: [facility];  Service: Orthopedics;  Laterality: Left;   UMBILICAL HERNIA REPAIR  1988   [facility], x 4   VEIN LIGATION AND STRIPPING Right    XI ROBOTIC ASSISTED VENTRAL HERNIA N/A 11/01/2021   Procedure: XI ROBOTIC ASSISTED VENTRAL HERNIA;  Surgeon: Eldred Grego, MD;  Location: [facility];  Service: General;  Laterality: N/A;    Home Medications:  Allergies as of 01/23/2024   No Known Allergies      Medication List         Accurate as of Jan 23, 2024  2:31 PM. If you have any questions, ask your nurse or doctor.          STOP taking these medications    gabapentin  300 MG capsule Commonly known as: NEURONTIN  Stopped by: Shirl Weir   Polyethyl Glycol-Propyl Glycol 0.4-0.3 % Soln Stopped by: Inaara Tye   pregabalin 50 MG capsule Commonly known as: LYRICA Stopped by: Delvon Chipps   rOPINIRole  0.5 MG tablet Commonly known as: REQUIP  Stopped by: Maripaz Mullan   silodosin  8 MG Caps capsule Commonly known as: RAPAFLO  Stopped by: Pollie Poma   tiZANidine 2 MG tablet Commonly known as: ZANAFLEX Stopped by: Alain Deschene       TAKE these medications    amiodarone  200 MG tablet Commonly known as: PACERONE  Take 0.5 tablets (100 mg total) by mouth daily.   camphor-menthol  lotion Commonly known as: SARNA Apply 1 application topically as needed for itching (dry skin).   cefUROXime 250 MG tablet Commonly known as: CEFTIN Take 1 tablet (250 mg total) by mouth 2 (two) times daily with a meal for 7 days. Started by: Matilde Son   CENTRUM SILVER ADULT 50+ PO Take 1 tablet by mouth daily.   diltiazem  120 MG 24 hr capsule Commonly known as: DILACOR XR  Take 1 capsule (120 mg total) by mouth daily.   eszopiclone 2 MG Tabs tablet Commonly known as: LUNESTA Take 2 mg by mouth at bedtime. Take immediately before bedtime   Gemtesa  75 MG Tabs Generic drug: Vibegron  Take 1 tablet (75 mg total) by mouth daily at 6 (six) AM.   levothyroxine  50 MCG tablet Commonly known as: SYNTHROID  Take 50 mcg by mouth daily before breakfast.   losartan  50 MG tablet Commonly known as: COZAAR  Take 1 tablet (50 mg total) by mouth daily.   omeprazole  20 MG capsule Commonly known as: PRILOSEC TAKE 1 CAPSULE BY MOUTH EVERY DAY AS NEEDED.   oxyCODONE -acetaminophen  10-325 MG tablet Commonly known as: PERCOCET Take 1 tablet by mouth 3 (three) times daily as needed for pain.    potassium chloride  10 MEQ tablet Commonly known as: KLOR-CON  Take 20 mEq by mouth 2 (two) times daily.        Allergies: No Known Allergies  Family History: Family History  Problem Relation Age of Onset   Osteoarthritis Father    Congestive Heart Failure Mother     Social History:  reports that he has never smoked. He has never used smokeless tobacco. He reports current alcohol  use of about 1.0 standard drink of alcohol  per week. He reports that he does not use drugs.  ROS: Pertinent ROS in HPI  Physical Exam: BP (!) 162/84   Pulse (!) 57   Ht 5\' 10"  (1.778 m)  Wt 165 lb (74.8 kg)   BMI 23.68 kg/m   Constitutional:  Well nourished. Alert and oriented, No acute distress. HEENT: [place] AT, moist mucus membranes.  Trachea midline Cardiovascular: No clubbing, cyanosis, or edema. Respiratory: Normal respiratory effort, no increased work of breathing. Neurologic: Grossly intact, no focal deficits, moving all 4 extremities. Psychiatric: Normal mood and affect.  Laboratory Data: ontains abnormal data CBC w/auto Differential (5 Part) Order: [id number] Component Ref Range & Units 2 mo ago  WBC (White Blood Cell Count) 4.1 - 10.2 10^3/uL 5.7  RBC (Red Blood Cell Count) 4.69 - 6.13 10^6/uL 3.73 Low   Hemoglobin 14.1 - 18.1 gm/dL 78.2 Low   Hematocrit 95.6 - 52.0 % 35.5 Low   MCV (Mean Corpuscular Volume) 80.0 - 100.0 fl 95.2  MCH (Mean Corpuscular Hemoglobin) 27.0 - 31.2 pg 31.9 High   MCHC (Mean Corpuscular Hemoglobin Concentration) 32.0 - 36.0 gm/dL 21.3  Platelet Count 086 - 450 10^3/uL 258  RDW-CV (Red Cell Distribution Width) 11.6 - 14.8 % 14.1  MPV (Mean Platelet Volume) 9.4 - 12.4 fl 12.4  Neutrophils 1.50 - 7.80 10^3/uL 4  Lymphocytes 1.00 - 3.60 10^3/uL 1  Monocytes 0.00 - 1.50 10^3/uL 0.59  Eosinophils 0.00 - 0.55 10^3/uL 0.03  Basophils 0.00 - 0.09 10^3/uL 0.03  Neutrophil % 32.0 - 70.0 % 70.6 High   Lymphocyte % 10.0 - 50.0 % 17.6  Monocyte  % 4.0 - 13.0 % 10.4  Eosinophil % 1.0 - 5.0 % 0.5 Low   Basophil% 0.0 - 2.0 % 0.5  Immature Granulocyte % <=0.7 % 0.4  Immature Granulocyte Count <=0.06 10^3/L 0.02  Resulting Agency [facility]   Specimen Collected: 11/13/23 15:06   Performed by: [facility] Last Resulted: 11/14/23 12:25  Received From: [facility]  Result Received: 11/30/23 13:50   Contains abnormal data Comprehensive Metabolic Panel (CMP) Order: [id number] Component Ref Range & Units 2 mo ago  Glucose 70 - 110 mg/dL 87  Sodium 528 - 413 mmol/L 141  Potassium 3.6 - 5.1 mmol/L 4.3  Chloride 97 - 109 mmol/L 103  Carbon Dioxide (CO2) 22.0 - 32.0 mmol/L 26.2  Urea Nitrogen (BUN) 7 - 25 mg/dL 32 High   Creatinine 0.7 - 1.3 mg/dL 1.5 High   Glomerular Filtration Rate (eGFR) >60 mL/min/1.73sq m 44 Low   Comment: CKD-EPI (2021) does not include patient's race in the calculation of eGFR.  Monitoring changes of plasma creatinine and eGFR over time is useful for monitoring kidney function.  Interpretive Ranges for eGFR (CKD-EPI 2021):  eGFR:       >60 mL/min/1.73 sq. m - Normal eGFR:       30-59 mL/min/1.73 sq. m - Moderately Decreased eGFR:       15-29 mL/min/1.73 sq. m  - Severely Decreased eGFR:       < 15 mL/min/1.73 sq. m  - Kidney Failure   Note: These eGFR calculations do not apply in acute situations when eGFR is changing rapidly or patients on dialysis.  Calcium  8.7 - 10.3 mg/dL 9.7  AST 8 - 39 U/L 30  ALT 6 - 57 U/L 18  Alk Phos (alkaline Phosphatase) 34 - 104 U/L 58  Albumin 3.5 - 4.8 g/dL 4.4  Bilirubin, Total 0.3 - 1.2 mg/dL 0.6  Protein, Total 6.1 - 7.9 g/dL 6.8  A/G Ratio 1.0 - 5.0 gm/dL 1.8  Resulting Agency [facility]   Specimen Collected: 11/13/23 15:06  Performed by: [facility] Last Resulted: 11/14/23 16:38  Received From: [facility]  Result Received: 11/30/23 13:50    Urinalysis See EPIC and HPI  I have reviewed the labs.   Pertinent Imaging:  01/23/24 13:29  Scan Result  I have independently reviewed the films.    Assessment & Plan:    1. Nocturia (Primary) - Continue Gemtesa  75 mg daily - Discussed that the possibility of him having a UTI may have interfered with how he responded to the Gemtesa , so I gave him 4 more weeks of samples   2. Suspected UTI - Urinalysis, Complete w/ bacteria - CULTURE, URINE COMPREHENSIVE - BLADDER SCAN AMB NON-IMAGING demonstrate adequate emptying - Start Ceftin 250 mg twice daily for 7 days   Return for Pending urine culture results.  These notes generated with voice recognition software. I apologize for typographical errors.  Briant Camper  [facility] [address]  [address] [place], [place] [address] [phone]

## 2024-01-27 LAB — CULTURE, URINE COMPREHENSIVE

## 2024-01-28 ENCOUNTER — Ambulatory Visit: Payer: Self-pay | Admitting: Urology

## 2024-03-28 ENCOUNTER — Telehealth: Payer: Self-pay

## 2024-03-28 MED ORDER — GEMTESA 75 MG PO TABS: 75.0000 mg | ORAL_TABLET | Freq: Every day | ORAL | 11 refills | Status: DC

## 2024-03-28 NOTE — Telephone Encounter (Signed)
 Pt's wife called the triage line stating the pt needs a prescription for gemtesa  sent. States he was given some gemtesa  samples to try and feels like it has helped. Rx sent. Pt's wife voiced understanding.

## 2024-05-21 ENCOUNTER — Telehealth: Payer: Self-pay | Admitting: Urology

## 2024-05-21 NOTE — Telephone Encounter (Signed)
 The patient called to ask whether he should continue taking Silodosin  or Gemtesa . Pt would like a call back with clarification

## 2024-05-21 NOTE — Telephone Encounter (Signed)
 Advised patient we will discuss this medication refills with Dr. Twylla .

## 2024-05-27 ENCOUNTER — Ambulatory Visit: Admitting: Urology

## 2024-05-28 ENCOUNTER — Encounter: Payer: Self-pay | Admitting: Urology

## 2024-05-28 ENCOUNTER — Ambulatory Visit (INDEPENDENT_AMBULATORY_CARE_PROVIDER_SITE_OTHER): Admitting: Urology

## 2024-05-28 VITALS — BP 165/68 | HR 76 | Ht 68.0 in | Wt 170.0 lb

## 2024-05-28 DIAGNOSIS — R399 Unspecified symptoms and signs involving the genitourinary system: Secondary | ICD-10-CM

## 2024-05-28 NOTE — Progress Notes (Signed)
 05/28/2024 3:49 PM   Michael [name] 02-14-1932 [id number]  Referring provider: [facility] [address] [place],  [place] [address]  No chief complaint on file.   HPI: Michael [name] is a 89 y.o. male who presents for follow-up.  His son was with him today who contributed to the history.  Refer to previous notes regarding lower urinary tract symptoms His son states he does think the silodosin  and Gemtesa  have been effective at improving his voiding symptoms however the prescriptions are not covered by his insurance He also receives medical care at the [facility] and were told if the prescriptions were sent to the [facility] they would be approved. Stable lower urinary tract symptoms and no additional complaints today  PMH: Past Medical History:  Diagnosis Date   Anemia    Aortic atherosclerosis (HCC)    Arthritis    Aspiration pneumonia (HCC) 2020   BPH (benign prostatic hypertrophy)    CAD (coronary artery disease) 05/23/2016   a.) CT 05/23/2016: extensive 3v CAD.   Cellulitis of face 12/21/2019   Chronic back pain    CKD (chronic kidney disease), stage III (HCC)    Colon cancer (HCC) 1988   Surgery alone, no history of chemo   Diastolic dysfunction 05/23/2014   a.) TTE 05/23/2014; EF 63%; G1DD. b.) TTE 10/27/2014: EF 61%, mild LA dilation; G2DD. c.) TTE 04/10/2020: EF >55%. GLS -20.3%. Triv panvalvular regurgitation; no stenosis.   Diverticulosis    Essential hypertension    First degree AV block    GERD (gastroesophageal reflux disease)    H/O calcium  pyrophosphate deposition disease (CPPD)    Hiatal hernia 01/06/2015   History of kidney stones    HLD (hyperlipidemia)    HOH (hard of hearing)    bilateral hearing aids   Hypokalemia    Impotence of organic origin    Incisional hernia 10/2021   Lewy body dementia (HCC)    Peritonsillar abscess determined by examination 05/15/2019   S/p abx, I&D by ENT 05/2019   PSVT (paroxysmal supraventricular tachycardia) (HCC)     a. 04/2014 Event Monitor: Freq bouts of SVT with rates to 180-->seen by EP with recommendation for RFCA, pt deferred;  b. 05/2014 Echo: EF nl, mild conc LVH, no rwma, Gr1 DD, mildMR/TR.   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   Rotator cuff rupture    Right; s/p repair   Unspecified hearing loss    Ventral hernia 1988    Surgical History: Past Surgical History:  Procedure Laterality Date   CATARACT EXTRACTION W/ INTRAOCULAR LENS IMPLANT Bilateral    COLONOSCOPY     HEMICOLOECTOMY W/ ANASTOMOSIS  1988   Michael [name]   INGUINAL HERNIA REPAIR Right 1963   INSERTION OF MESH N/A 11/01/2021   Procedure: INSERTION OF MESH;  Surgeon: Michael Romano, MD;  Location: [facility];  Service: General;  Laterality: N/A;   MASTECTOMY, PARTIAL Left 02/07/2013   LEFT SUBCUTANEOUS MASTECTOMY; Michael POUR. Belinda, MD   ROTATOR CUFF REPAIR Right 2000    Michael [name]   TOTAL HIP ARTHROPLASTY Right 03/24/2020   Procedure: TOTAL HIP ARTHROPLASTY ANTERIOR APPROACH;  Surgeon: Michael Donnice, MD;  Location: [facility];  Service: Orthopedics;  Laterality: Right;  70 mins   TOTAL KNEE ARTHROPLASTY  03/05/2012   Procedure: TOTAL KNEE ARTHROPLASTY;  Surgeon: Michael LULLA Moan, MD;  Location: [facility];  Service: Orthopedics;  Laterality: Left;   UMBILICAL HERNIA REPAIR  1988   [facility], x 4   VEIN LIGATION AND  STRIPPING Right    XI ROBOTIC ASSISTED VENTRAL HERNIA N/A 11/01/2021   Procedure: XI ROBOTIC ASSISTED VENTRAL HERNIA;  Surgeon: Michael Romano, MD;  Location: [facility];  Service: General;  Laterality: N/A;    Home Medications:  Allergies as of 05/28/2024   No Known Allergies      Medication List        Accurate as of May 28, 2024  3:49 PM. If you have any questions, ask your nurse or doctor.          amiodarone  200 MG tablet Commonly known as: PACERONE  Take 0.5 tablets (100 mg total) by mouth daily.   camphor-menthol  lotion Commonly known as: SARNA Apply 1 application topically  as needed for itching (dry skin).   CENTRUM SILVER ADULT 50+ PO Take 1 tablet by mouth daily.   diltiazem  120 MG 24 hr capsule Commonly known as: DILACOR XR  Take 1 capsule (120 mg total) by mouth daily.   eszopiclone 2 MG Tabs tablet Commonly known as: LUNESTA Take 2 mg by mouth at bedtime. Take immediately before bedtime   Gemtesa  75 MG Tabs Generic drug: Vibegron  Take 1 tablet (75 mg total) by mouth daily at 6 (six) AM.   Gemtesa  75 MG Tabs Generic drug: Vibegron  Take 1 tablet (75 mg total) by mouth daily.   levothyroxine  50 MCG tablet Commonly known as: SYNTHROID  Take 50 mcg by mouth daily before breakfast.   losartan  50 MG tablet Commonly known as: COZAAR  Take 1 tablet (50 mg total) by mouth daily.   omeprazole  20 MG capsule Commonly known as: PRILOSEC TAKE 1 CAPSULE BY MOUTH EVERY DAY AS NEEDED.   oxyCODONE -acetaminophen  10-325 MG tablet Commonly known as: PERCOCET Take 1 tablet by mouth 3 (three) times daily as needed for pain.   potassium chloride  10 MEQ tablet Commonly known as: KLOR-CON  Take 20 mEq by mouth 2 (two) times daily.        Allergies: No Known Allergies  Family History: Family History  Problem Relation Age of Onset   Osteoarthritis Father    Congestive Heart Failure Mother     Social History:  reports that he has never smoked. He has never used smokeless tobacco. He reports current alcohol  use of about 1.0 standard drink of alcohol  per week. He reports that he does not use drugs.   Physical Exam: BP (!) 165/68   Pulse 76   Ht 5' 8 (1.727 m)   Wt 170 lb (77.1 kg)   BMI 25.85 kg/m   Constitutional:  Alert, no acute distress HEENT: [place] AT Respiratory: Normal respiratory effort, no increased work of breathing.    Assessment & Plan:    1.  Lower urinary tract symptoms Rx  Michael JAYSON Barba, MD  [facility] [address], [address] [place], [place] [address] [phone]

## 2024-06-01 MED ORDER — SILODOSIN 8 MG PO CAPS: 8.0000 mg | ORAL_CAPSULE | Freq: Every day | ORAL | 11 refills | Status: DC

## 2024-06-01 MED ORDER — GEMTESA 75 MG PO TABS: 75.0000 mg | ORAL_TABLET | Freq: Every day | ORAL | 11 refills | Status: AC

## 2024-06-20 ENCOUNTER — Telehealth: Payer: Self-pay

## 2024-06-20 NOTE — Telephone Encounter (Signed)
 Pts wife called us  this morning because the [facility] had yet to fill the pts Silodosin . I had them call the [facility] pharmacy to see what the hold up was. [facility] told [facility] and that the Provider would have to reach out to them for more reasoning of why the pt needs this medication. Pt was previously already on this and having it filled at outside [facility]. The [facility] provider told them to go through community with us  to have it covered in turn they denied the med after. Pts wife would like to see how we can get this approved from the [place] pharmacy.

## 2024-06-22 ENCOUNTER — Encounter: Payer: Self-pay | Admitting: Emergency Medicine

## 2024-06-22 ENCOUNTER — Other Ambulatory Visit: Payer: Self-pay

## 2024-06-22 ENCOUNTER — Emergency Department
Admission: EM | Admit: 2024-06-22 | Discharge: 2024-06-22 | Disposition: A | Discharge status: Home / Self Care | Attending: Emergency Medicine | Admitting: Emergency Medicine

## 2024-06-22 ENCOUNTER — Emergency Department

## 2024-06-22 DIAGNOSIS — R600 Localized edema: Secondary | ICD-10-CM | POA: Diagnosis not present

## 2024-06-22 DIAGNOSIS — N189 Chronic kidney disease, unspecified: Secondary | ICD-10-CM | POA: Insufficient documentation

## 2024-06-22 DIAGNOSIS — G3183 Dementia with Lewy bodies: Secondary | ICD-10-CM | POA: Diagnosis not present

## 2024-06-22 DIAGNOSIS — L03116 Cellulitis of left lower limb: Secondary | ICD-10-CM

## 2024-06-22 DIAGNOSIS — M7989 Other specified soft tissue disorders: Secondary | ICD-10-CM

## 2024-06-22 DIAGNOSIS — I129 Hypertensive chronic kidney disease with stage 1 through stage 4 chronic kidney disease, or unspecified chronic kidney disease: Secondary | ICD-10-CM | POA: Diagnosis not present

## 2024-06-22 LAB — COMPREHENSIVE METABOLIC PANEL WITH GFR
ALT: 16 U/L (ref 0–44)
AST: 26 U/L (ref 15–41)
Albumin: 3.5 g/dL (ref 3.5–5.0)
Alkaline Phosphatase: 57 U/L (ref 38–126)
Anion gap: 16 — ABNORMAL HIGH (ref 5–15)
BUN: 26 mg/dL — ABNORMAL HIGH (ref 8–23)
CO2: 23 mmol/L (ref 22–32)
Calcium: 9.2 mg/dL (ref 8.9–10.3)
Chloride: 102 mmol/L (ref 98–111)
Creatinine, Ser: 1.62 mg/dL — ABNORMAL HIGH (ref 0.61–1.24)
GFR, Estimated: 40 mL/min — ABNORMAL LOW (ref >60)
Glucose, Bld: 100 mg/dL — ABNORMAL HIGH (ref 70–99)
Potassium: 4.2 mmol/L (ref 3.5–5.1)
Sodium: 141 mmol/L (ref 135–145)
Total Bilirubin: 0.8 mg/dL (ref 0.0–1.2)
Total Protein: 6.6 g/dL (ref 6.5–8.1)

## 2024-06-22 LAB — CBC WITH DIFFERENTIAL/PLATELET
Abs Immature Granulocytes: 0.05 K/uL (ref 0.00–0.07)
Basophils Absolute: 0.1 K/uL (ref 0.0–0.1)
Basophils Relative: 1 %
Eosinophils Absolute: 0 K/uL (ref 0.0–0.5)
Eosinophils Relative: 0 %
HCT: 33 % — ABNORMAL LOW (ref 39.0–52.0)
Hemoglobin: 10.9 g/dL — ABNORMAL LOW (ref 13.0–17.0)
Immature Granulocytes: 1 %
Lymphocytes Relative: 9 %
Lymphs Abs: 0.9 K/uL (ref 0.7–4.0)
MCH: 33.9 pg (ref 26.0–34.0)
MCHC: 33 g/dL (ref 30.0–36.0)
MCV: 102.5 fL — ABNORMAL HIGH (ref 80.0–100.0)
Monocytes Absolute: 1 K/uL (ref 0.1–1.0)
Monocytes Relative: 11 %
Neutro Abs: 7.4 K/uL (ref 1.7–7.7)
Neutrophils Relative %: 78 %
Platelets: 374 K/uL (ref 150–400)
RBC: 3.22 MIL/uL — ABNORMAL LOW (ref 4.22–5.81)
RDW: 12.5 % (ref 11.5–15.5)
WBC: 9.4 K/uL (ref 4.0–10.5)
nRBC: 0 % (ref 0.0–0.2)

## 2024-06-22 MED ORDER — DOXYCYCLINE HYCLATE 100 MG PO CAPS: 100.0000 mg | ORAL_CAPSULE | Freq: Two times a day (BID) | ORAL | 0 refills | Status: AC

## 2024-06-22 MED ORDER — DOXYCYCLINE HYCLATE 100 MG PO TABS
100.0000 mg | ORAL_TABLET | Freq: Once | ORAL | Status: AC
Start: 2024-06-22 — End: 2024-06-22
  Administered 2024-06-22: 100 mg via ORAL
  Filled 2024-06-22: qty 1

## 2024-06-22 MED ORDER — SODIUM CHLORIDE 0.9 % IV SOLN
2.0000 g | Freq: Once | INTRAVENOUS | Status: AC
  Administered 2024-06-22: 2 g via INTRAVENOUS
  Filled 2024-06-22: qty 20

## 2024-06-22 MED ORDER — CEFDINIR 300 MG PO CAPS: 300.0000 mg | ORAL_CAPSULE | Freq: Two times a day (BID) | ORAL | 0 refills | Status: AC

## 2024-06-22 NOTE — ED Notes (Signed)
 Pt and family given Dc instructions. Family verbalized understanding of medication and follow up care. Pt taken from ED in wheelchair by this RN.

## 2024-06-22 NOTE — ED Triage Notes (Signed)
 Pt via POV from home. Pt c/o bilateral lower leg pain and swelling for the past 3 days, reports swelling is worse in the L leg. Tender to touch. Redness noted to the L leg. Denies any CP/SOB. Pt is A&Ox4 and NAD

## 2024-06-22 NOTE — ED Provider Notes (Signed)
 Procedures     ----------------------------------------- 4:52 PM on 06/22/2024 -----------------------------------------  Ultrasound negative for DVT.  Updated patient and family.  Left leg does exhibit inflammatory changes suspicious for cellulitis.  He is not septic.  They do note he was recently treated about a week ago for cellulitis of the right leg, successfully resolved with Keflex .  I am worried that the infection of the left leg may be Keflex  resistant, recommended hospitalization to patient and family, however they declined due to patient having a strong desire to be treated at home.  They note that he has been low energy for the past week at home but they feel comfortable with supporting him and monitoring his symptoms and skin infection and returning to the ED if things are worsening.  I think this is reasonable, will start Omnicef and Doxy after giving a dose of Rocephin  and Doxy in the ED.  Final diagnoses:  Left leg swelling  Cellulitis of left lower extremity       Viviann Pastor, MD 06/22/24 [phone]

## 2024-06-22 NOTE — ED Provider Notes (Signed)
 [facility] Provider Note    Event Date/Time   First MD Initiated Contact with Patient 06/22/24 1223     (approximate)   History   Leg Swelling   HPI  Michael [name] is a 89 year old male with history of Lewy body dementia, hypertension, CKD presenting to the emergency department for evaluation of leg swelling.  Family notes that a few days ago they noticed that patient had increased redness and swelling in his left lower extremity.  Patient reports that he feels like both of his legs are swollen.  No chest pain or shortness of breath.  Did have a temp of 100.2 F at home a few days ago.     Physical Exam   Triage Vital Signs: ED Triage Vitals  Encounter Vitals Group     BP 06/22/24 1214 90/75     Girls Systolic BP Percentile --      Girls Diastolic BP Percentile --      Boys Systolic BP Percentile --      Boys Diastolic BP Percentile --      Pulse Rate 06/22/24 1214 83     Resp 06/22/24 1214 18     Temp 06/22/24 1214 98.5 F (36.9 C)     Temp Source 06/22/24 1214 Oral     SpO2 06/22/24 1214 95 %     Weight 06/22/24 1211 170 lb (77.1 kg)     Height 06/22/24 1211 5' 10 (1.778 m)     Head Circumference --      Peak Flow --      Pain Score 06/22/24 1210 2     Pain Loc --      Pain Education --      Exclude from Growth Chart --     Most recent vital signs: Vitals:   06/22/24 1214  BP: 90/75  Pulse: 83  Resp: 18  Temp: 98.5 F (36.9 C)  SpO2: 95%     General: Awake, interactive  CV:  Good peripheral perfusion Resp:  Unlabored respirations, lungs clear to auscultation Abd:  Nondistended.  Neuro:  Symmetric facial movement, fluid speech Other:   Pitting edema of the bilateral lower extremities, significantly worse on the left.  Intact distal pulses.  There is erythema and warmth of distal left lower extremity compared to the contralateral side.   ED Results / Procedures / Treatments   Labs (all labs ordered are listed, but only  abnormal results are displayed) Labs Reviewed  CBC WITH DIFFERENTIAL/PLATELET - Abnormal; Notable for the following components:      Result Value   RBC 3.22 (*)    Hemoglobin 10.9 (*)    HCT 33.0 (*)    MCV 102.5 (*)    All other components within normal limits  COMPREHENSIVE METABOLIC PANEL WITH GFR - Abnormal; Notable for the following components:   Glucose, Bld 100 (*)    BUN 26 (*)    Creatinine, Ser 1.62 (*)    GFR, Estimated 40 (*)    Anion gap 16 (*)    All other components within normal limits     EKG EKG independently reviewed and interpreted by myself demonstrates:    RADIOLOGY Imaging independently reviewed and interpreted by myself demonstrates:  US  pending  Formal Radiology Read:  No results found.  PROCEDURES:  Critical Care performed: No  Procedures   MEDICATIONS ORDERED IN ED: Medications - No data to display   IMPRESSION / MDM / ASSESSMENT AND PLAN / ED  COURSE  I reviewed the triage vital signs and the nursing notes.  Differential diagnosis includes, but is not limited to, DVT, cellulitis, lower suspicion CHF given asymmetric findings, electrolyte abnormality  Patient's presentation is most consistent with acute presentation with potential threat to life or bodily function.  89 year old male presenting to the emergency department for evaluation of lower extremity swelling.  CBC with normal white blood cell count, stable anemia, CMP with renal impairment at 1.6, overall stable compared to 1.5 earlier this year.  Ultrasound of the lower extremities ordered to further evaluate.  If this is without evidence of DVT, suspect likely cellulitis of patient's left lower extremity.  Signed out to oncoming physician pending US  and disposition.       FINAL CLINICAL IMPRESSION(S) / ED DIAGNOSES   Final diagnoses:  Left leg swelling     Rx / DC Orders   ED Discharge Orders     None        Note:  This document was prepared using Dragon voice  recognition software and may include unintentional dictation errors.   Levander Slate, MD 06/22/24 [phone]

## 2024-06-26 ENCOUNTER — Emergency Department (HOSPITAL_COMMUNITY)

## 2024-06-26 ENCOUNTER — Emergency Department (HOSPITAL_COMMUNITY)
Admission: EM | Admit: 2024-06-26 | Discharge: 2024-06-26 | Disposition: A | Discharge status: Home / Self Care | Attending: Emergency Medicine | Admitting: Emergency Medicine

## 2024-06-26 DIAGNOSIS — I129 Hypertensive chronic kidney disease with stage 1 through stage 4 chronic kidney disease, or unspecified chronic kidney disease: Secondary | ICD-10-CM | POA: Diagnosis not present

## 2024-06-26 DIAGNOSIS — Z79899 Other long term (current) drug therapy: Secondary | ICD-10-CM | POA: Diagnosis not present

## 2024-06-26 DIAGNOSIS — I2510 Atherosclerotic heart disease of native coronary artery without angina pectoris: Secondary | ICD-10-CM | POA: Insufficient documentation

## 2024-06-26 DIAGNOSIS — R4781 Slurred speech: Secondary | ICD-10-CM | POA: Diagnosis not present

## 2024-06-26 DIAGNOSIS — R791 Abnormal coagulation profile: Secondary | ICD-10-CM | POA: Diagnosis not present

## 2024-06-26 DIAGNOSIS — R29810 Facial weakness: Secondary | ICD-10-CM | POA: Diagnosis not present

## 2024-06-26 DIAGNOSIS — Z85038 Personal history of other malignant neoplasm of large intestine: Secondary | ICD-10-CM | POA: Diagnosis not present

## 2024-06-26 DIAGNOSIS — F0280 Dementia in other diseases classified elsewhere without behavioral disturbance: Secondary | ICD-10-CM | POA: Diagnosis not present

## 2024-06-26 DIAGNOSIS — N183 Chronic kidney disease, stage 3 unspecified: Secondary | ICD-10-CM | POA: Diagnosis not present

## 2024-06-26 DIAGNOSIS — G3183 Dementia with Lewy bodies: Secondary | ICD-10-CM

## 2024-06-26 DIAGNOSIS — R29818 Other symptoms and signs involving the nervous system: Secondary | ICD-10-CM | POA: Insufficient documentation

## 2024-06-26 DIAGNOSIS — R2990 Unspecified symptoms and signs involving the nervous system: Secondary | ICD-10-CM

## 2024-06-26 DIAGNOSIS — F0390 Unspecified dementia without behavioral disturbance: Secondary | ICD-10-CM | POA: Diagnosis not present

## 2024-06-26 DIAGNOSIS — Z8616 Personal history of COVID-19: Secondary | ICD-10-CM | POA: Insufficient documentation

## 2024-06-26 DIAGNOSIS — Z96641 Presence of right artificial hip joint: Secondary | ICD-10-CM | POA: Diagnosis not present

## 2024-06-26 DIAGNOSIS — R569 Unspecified convulsions: Secondary | ICD-10-CM

## 2024-06-26 DIAGNOSIS — R531 Weakness: Secondary | ICD-10-CM | POA: Diagnosis not present

## 2024-06-26 LAB — COMPREHENSIVE METABOLIC PANEL WITH GFR
ALT: 16 U/L (ref 0–44)
AST: 28 U/L (ref 15–41)
Albumin: 3.1 g/dL — ABNORMAL LOW (ref 3.5–5.0)
Alkaline Phosphatase: 68 U/L (ref 38–126)
Anion gap: 11 (ref 5–15)
BUN: 32 mg/dL — ABNORMAL HIGH (ref 8–23)
CO2: 24 mmol/L (ref 22–32)
Calcium: 8.6 mg/dL — ABNORMAL LOW (ref 8.9–10.3)
Chloride: 101 mmol/L (ref 98–111)
Creatinine, Ser: 1.88 mg/dL — ABNORMAL HIGH (ref 0.61–1.24)
GFR, Estimated: 34 mL/min — ABNORMAL LOW (ref >60)
Glucose, Bld: 99 mg/dL (ref 70–99)
Potassium: 4.3 mmol/L (ref 3.5–5.1)
Sodium: 136 mmol/L (ref 135–145)
Total Bilirubin: 0.7 mg/dL (ref 0.0–1.2)
Total Protein: 6.1 g/dL — ABNORMAL LOW (ref 6.5–8.1)

## 2024-06-26 LAB — I-STAT CHEM 8, ED
BUN: 32 mg/dL — ABNORMAL HIGH (ref 8–23)
Calcium, Ion: 1.21 mmol/L (ref 1.15–1.40)
Chloride: 101 mmol/L (ref 98–111)
Creatinine, Ser: 2 mg/dL — ABNORMAL HIGH (ref 0.61–1.24)
Glucose, Bld: 93 mg/dL (ref 70–99)
HCT: 32 % — ABNORMAL LOW (ref 39.0–52.0)
Hemoglobin: 10.9 g/dL — ABNORMAL LOW (ref 13.0–17.0)
Potassium: 4.3 mmol/L (ref 3.5–5.1)
Sodium: 138 mmol/L (ref 135–145)
TCO2: 26 mmol/L (ref 22–32)

## 2024-06-26 LAB — DIFFERENTIAL
Abs Immature Granulocytes: 0.04 K/uL (ref 0.00–0.07)
Basophils Absolute: 0.1 K/uL (ref 0.0–0.1)
Basophils Relative: 1 %
Eosinophils Absolute: 0.1 K/uL (ref 0.0–0.5)
Eosinophils Relative: 2 %
Immature Granulocytes: 1 %
Lymphocytes Relative: 29 %
Lymphs Abs: 1.4 K/uL (ref 0.7–4.0)
Monocytes Absolute: 0.6 K/uL (ref 0.1–1.0)
Monocytes Relative: 12 %
Neutro Abs: 2.7 K/uL (ref 1.7–7.7)
Neutrophils Relative %: 55 %

## 2024-06-26 LAB — CBC
HCT: 32.6 % — ABNORMAL LOW (ref 39.0–52.0)
Hemoglobin: 10.8 g/dL — ABNORMAL LOW (ref 13.0–17.0)
MCH: 33.3 pg (ref 26.0–34.0)
MCHC: 33.1 g/dL (ref 30.0–36.0)
MCV: 100.6 fL — ABNORMAL HIGH (ref 80.0–100.0)
Platelets: 434 K/uL — ABNORMAL HIGH (ref 150–400)
RBC: 3.24 MIL/uL — ABNORMAL LOW (ref 4.22–5.81)
RDW: 12.4 % (ref 11.5–15.5)
WBC: 4.9 K/uL (ref 4.0–10.5)
nRBC: 0 % (ref 0.0–0.2)

## 2024-06-26 LAB — PROTIME-INR
INR: 1.1 (ref 0.8–1.2)
Prothrombin Time: 15 s (ref 11.4–15.2)

## 2024-06-26 LAB — ETHANOL: Alcohol, Ethyl (B): <15 mg/dL (ref <15)

## 2024-06-26 LAB — APTT: aPTT: 27 s (ref 24–36)

## 2024-06-26 LAB — CBG MONITORING, ED: Glucose-Capillary: 98 mg/dL (ref 70–99)

## 2024-06-26 MED ORDER — SODIUM CHLORIDE 0.9% FLUSH: 3.0000 mL | Freq: Once | INTRAVENOUS | Status: DC

## 2024-06-26 NOTE — ED Provider Notes (Signed)
[place] [facility] AT [facility] Provider Note  CSN: [id number] Arrival date & time: 06/26/24 1825  Chief Complaint(s) Code Stroke  HPI Michael [name] is a 89 y.o. male history of Lewy body dementia, CKD, coronary artery disease, hypertension presenting with episode of left-sided weakness.  Patient apparently came back from mowing the lawn around 5:30 PM.  Developed left-sided facial droop, slurred speech, left-sided weakness.  Was complaining of feeling a sensation of running water  down his arm.  Paramedics were called, brought patient to the ER and activated code stroke.  Patient seen by neurology on arrival.  Patient currently reports he has absolutely no complaints and feels normal.  Family report he is back to baseline.  Patient denies any chest pain, headache, abdominal pain, back pain, other new symptoms.  He was recently seen at outside hospital for left lower extremity cellulitis and leg swelling, also evaluate for DVT which is negative.   Past Medical History Past Medical History:  Diagnosis Date   Anemia    Aortic atherosclerosis    Arthritis    Aspiration pneumonia (HCC) 2020   BPH (benign prostatic hypertrophy)    CAD (coronary artery disease) 05/23/2016   a.) CT 05/23/2016: extensive 3v CAD.   Cellulitis of face 12/21/2019   Chronic back pain    CKD (chronic kidney disease), stage III (HCC)    Colon cancer (HCC) 1988   Surgery alone, no history of chemo   Diastolic dysfunction 05/23/2014   a.) TTE 05/23/2014; EF 63%; G1DD. b.) TTE 10/27/2014: EF 61%, mild LA dilation; G2DD. c.) TTE 04/10/2020: EF >55%. GLS -20.3%. Triv panvalvular regurgitation; no stenosis.   Diverticulosis    Essential hypertension    First degree AV block    GERD (gastroesophageal reflux disease)    H/O calcium  pyrophosphate deposition disease (CPPD)    Hiatal hernia 01/06/2015   History of kidney stones    HLD (hyperlipidemia)    HOH (hard of hearing)    bilateral  hearing aids   Hypokalemia    Impotence of organic origin    Incisional hernia 10/2021   Lewy body dementia (HCC)    Peritonsillar abscess determined by examination 05/15/2019   S/p abx, I&D by ENT 05/2019   PSVT (paroxysmal supraventricular tachycardia)    a. 04/2014 Event Monitor: Freq bouts of SVT with rates to 180-->seen by EP with recommendation for RFCA, pt deferred;  b. 05/2014 Echo: EF nl, mild conc LVH, no rwma, Gr1 DD, mildMR/TR.   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   Rotator cuff rupture    Right; s/p repair   Unspecified hearing loss    Ventral hernia 1988   Patient Active Problem List   Diagnosis Date Noted   Pneumonia due to COVID-19 virus 05/25/2023   Acute kidney injury superimposed on chronic kidney disease 05/24/2023   COVID-19 virus infection 05/24/2023   Sepsis due to pneumonia (HCC) 05/24/2023   Acute encephalopathy 05/24/2023   Aspiration pneumonia (HCC) 11/12/2022   Acute respiratory failure with hypoxia (HCC) 11/12/2022   Memory deficit 10/15/2020   Right shoulder pain 05/14/2020   Pedal edema 04/16/2020   Anemia 04/16/2020   Osteoarthritis of right hip 03/24/2020   Status post right hip replacement 03/24/2020   Pre-op evaluation 02/19/2020   Lewy body dementia (HCC) 06/07/2019   Auditory hallucinations 05/09/2019   Medicare annual wellness visit, subsequent 05/08/2019   Health maintenance examination 05/08/2019   Advanced care planning/counseling discussion 05/08/2019   Chronic insomnia 02/08/2019  GERD (gastroesophageal reflux disease) 10/16/2018   Aortic atherosclerosis 11/04/2016   Coronary artery disease involving native coronary artery of native heart without angina pectoris 11/04/2016   Hiatal hernia 01/06/2015   CAP (community acquired pneumonia) 01/03/2015   Hematemesis without nausea 01/03/2015   PSVT (paroxysmal supraventricular tachycardia) (HCC) 04/18/2014   Dizziness 02/21/2014   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   Impotence  of organic origin    Essential hypertension 01/08/2010   BPH with obstruction/lower urinary tract symptoms 01/07/2010   Bilateral hearing loss 12/08/2008   Gait disturbance 12/08/2008   Hyperlipidemia LDL goal <100 07/23/2008   CKD (chronic kidney disease) stage 3, GFR 30-59 ml/min (HCC) 07/23/2008   History of colon cancer 07/22/2008   Home Medication(s) Prior to Admission medications   Medication Sig Start Date End Date Taking? Authorizing Provider  amiodarone  (PACERONE ) 200 MG tablet Take 0.5 tablets (100 mg total) by mouth daily. 07/17/23   Gollan, Timothy J, MD  camphor-menthol  [facility]) lotion Apply 1 application topically as needed for itching (dry skin). 11/12/20   [provider]  cefdinir (OMNICEF) 300 MG capsule Take 1 capsule (300 mg total) by mouth 2 (two) times daily. 06/22/24   Viviann Pastor, MD  diltiazem  (DILACOR XR ) 120 MG 24 hr capsule Take 1 capsule (120 mg total) by mouth daily. 07/17/23   Gollan, Timothy J, MD  doxycycline  (VIBRAMYCIN ) 100 MG capsule Take 1 capsule (100 mg total) by mouth 2 (two) times daily for 7 days. 06/22/24 06/29/24  Viviann Pastor, MD  eszopiclone (LUNESTA) 2 MG TABS tablet Take 2 mg by mouth at bedtime. Take immediately before bedtime    [provider]  levothyroxine  (SYNTHROID ) 50 MCG tablet Take 50 mcg by mouth daily before breakfast.    [provider]  losartan  (COZAAR ) 50 MG tablet Take 1 tablet (50 mg total) by mouth daily. 07/17/23   Gollan, Timothy J, MD  Multiple Vitamins-Minerals (CENTRUM SILVER ADULT 50+ PO) Take 1 tablet by mouth daily.    [provider]  omeprazole  (PRILOSEC) 20 MG capsule TAKE 1 CAPSULE BY MOUTH EVERY DAY AS NEEDED. 11/07/18   Copland, Jacques, MD  oxyCODONE -acetaminophen  (PERCOCET) 10-325 MG tablet Take 1 tablet by mouth 3 (three) times daily as needed for pain.    [provider]  potassium chloride  (KLOR-CON ) 10 MEQ tablet Take 20 mEq by mouth 2 (two) times daily.     [provider]  silodosin  (RAPAFLO ) 8 MG CAPS capsule Take 1 capsule (8 mg total) by mouth daily with breakfast. 06/01/24   Stoioff, Glendia BROCKS, MD  Vibegron  (GEMTESA ) 75 MG TABS Take 1 tablet (75 mg total) by mouth daily. 06/01/24 05/27/25  Twylla Glendia BROCKS, MD                                                                                                                                    Past Surgical History Past Surgical History:  Procedure Laterality Date   CATARACT EXTRACTION W/ INTRAOCULAR LENS IMPLANT Bilateral    COLONOSCOPY     HEMICOLOECTOMY W/ ANASTOMOSIS  1988   [name]   INGUINAL HERNIA REPAIR Right 1963   INSERTION OF MESH N/A 11/01/2021   Procedure: INSERTION OF MESH;  Surgeon: Rodolph Romano, MD;  Location: [facility] ORS;  Service: General;  Laterality: N/A;   MASTECTOMY, PARTIAL Left 02/07/2013   LEFT SUBCUTANEOUS MASTECTOMY; Donnice POUR. Belinda, MD   ROTATOR CUFF REPAIR Right 2000    [facility]   TOTAL HIP ARTHROPLASTY Right 03/24/2020   Procedure: TOTAL HIP ARTHROPLASTY ANTERIOR APPROACH;  Surgeon: Ernie Donnice, MD;  Location: [facility];  Service: Orthopedics;  Laterality: Right;  70 mins   TOTAL KNEE ARTHROPLASTY  03/05/2012   Procedure: TOTAL KNEE ARTHROPLASTY;  Surgeon: Dempsey LULLA Moan, MD;  Location: [facility];  Service: Orthopedics;  Laterality: Left;   UMBILICAL HERNIA REPAIR  1988   [facility], x 4   VEIN LIGATION AND STRIPPING Right    XI ROBOTIC ASSISTED VENTRAL HERNIA N/A 11/01/2021   Procedure: XI ROBOTIC ASSISTED VENTRAL HERNIA;  Surgeon: Rodolph Romano, MD;  Location: [facility] ORS;  Service: General;  Laterality: N/A;   Family History Family History  Problem Relation Age of Onset   Osteoarthritis Father    Congestive Heart Failure Mother     Social History Social History   Tobacco Use   Smoking status: Never   Smokeless tobacco: Never  Vaping Use   Vaping status: Never Used  Substance Use Topics   Alcohol  use: Yes     Alcohol /week: 1.0 standard drink of alcohol     Types: 1 Glasses of wine per week    Comment: Wine (1/2-1 glass 2x/week)   Drug use: No   Allergies Tamsulosin   Review of Systems Review of Systems  All other systems reviewed and are negative.   Physical Exam Vital Signs  I have reviewed the triage vital signs BP (!) 152/80 (BP Location: Left Arm)   Pulse 72   Temp 98.5 F (36.9 C) (Oral)   Resp 19   Wt 79.8 kg   SpO2 100%   BMI 25.24 kg/m  Physical Exam Vitals and nursing note reviewed.  Constitutional:      General: He is not in acute distress.    Appearance: Normal appearance.  HENT:     Mouth/Throat:     Mouth: Mucous membranes are moist.  Eyes:     Conjunctiva/sclera: Conjunctivae normal.  Cardiovascular:     Rate and Rhythm: Normal rate and regular rhythm.  Pulmonary:     Effort: Pulmonary effort is normal. No respiratory distress.     Breath sounds: Normal breath sounds.  Abdominal:     General: Abdomen is flat.     Palpations: Abdomen is soft.     Tenderness: There is no abdominal tenderness.  Musculoskeletal:     Right lower leg: No edema.     Left lower leg: No edema.  Skin:    General: Skin is warm and dry.     Capillary Refill: Capillary refill takes less than 2 seconds.  Neurological:     Mental Status: He is alert. Mental status is at baseline.     Comments: Cranial nerves II through XII intact, strength 5 out of 5 in the bilateral upper and lower extremities, no sensory deficit to light touch, no dysmetria on finger-nose-finger testing  Psychiatric:        Mood and Affect: Mood normal.  Behavior: Behavior normal.     ED Results and Treatments Labs (all labs ordered are listed, but only abnormal results are displayed) Labs Reviewed  CBC - Abnormal; Notable for the following components:      Result Value   RBC 3.24 (*)    Hemoglobin 10.8 (*)    HCT 32.6 (*)    MCV 100.6 (*)    Platelets 434 (*)    All other components within normal  limits  COMPREHENSIVE METABOLIC PANEL WITH GFR - Abnormal; Notable for the following components:   BUN 32 (*)    Creatinine, Ser 1.88 (*)    Calcium  8.6 (*)    Total Protein 6.1 (*)    Albumin 3.1 (*)    GFR, Estimated 34 (*)    All other components within normal limits  I-STAT CHEM 8, ED - Abnormal; Notable for the following components:   BUN 32 (*)    Creatinine, Ser 2.00 (*)    Hemoglobin 10.9 (*)    HCT 32.0 (*)    All other components within normal limits  PROTIME-INR  APTT  DIFFERENTIAL  ETHANOL  CBG MONITORING, ED                                                                                                                          Radiology CT HEAD CODE STROKE WO CONTRAST Result Date: 06/26/2024 CLINICAL DATA:  Code stroke. Neuro deficit, acute, stroke suspected. Left arm weakness, drift, gait abnormality, and facial droop. EXAM: CT HEAD WITHOUT CONTRAST TECHNIQUE: Contiguous axial images were obtained from the base of the skull through the vertex without intravenous contrast. RADIATION DOSE REDUCTION: This exam was performed according to the departmental dose-optimization program which includes automated exposure control, adjustment of the mA and/or kV according to patient size and/or use of iterative reconstruction technique. COMPARISON:  Head MRI 08/22/2019 FINDINGS: Brain: There is no evidence of an acute infarct, intracranial hemorrhage, mass, midline shift, or extra-axial fluid collection. There is mild cerebral atrophy. Patchy cerebral white matter hypodensities are nonspecific but compatible with mild chronic small vessel ischemic disease. Vascular: Calcified atherosclerosis at the skull base. No hyperdense vessel. Skull: No fracture or suspicious lesion. Sinuses/Orbits: Paranasal sinuses and mastoid air cells are clear. Bilateral cataract extraction. Other: None. ASPECTS (Alberta Stroke Program Early CT Score) - Ganglionic level infarction (caudate, lentiform nuclei,  internal capsule, insula, M1-M3 cortex): 7 - Supraganglionic infarction (M4-M6 cortex): 3 Total score (0-10 with 10 being normal): 10 These results were communicated to Dr. Sallyann at 6:40 pm on 06/26/2024 by text page via the [facility] [place] [facility] messaging system. IMPRESSION: 1. No evidence of acute intracranial abnormality. ASPECTS of 10. 2. Mild chronic small vessel ischemic disease. Electronically Signed   By: Dasie Hamburg M.D.   On: 06/26/2024 18:40    Pertinent labs & imaging results that were available during my care of the patient were reviewed by me and considered in my medical decision making (see MDM for details).  Medications  Ordered in ED Medications  sodium chloride  flush (NS) 0.9 % injection 3 mL (has no administration in time range)                                                                                                                                     Procedures Procedures  (including critical care time)  Medical Decision Making / ED Course   MDM:  89 year old presenting to the emergency department episode of left arm weakness  Patient currently well-appearing, denying any complaints.  Vital signs are reassuring.  No focal deficits appreciated at this time.  Does seem mildly confused consistent with dementia.  Family reports it is at baseline.  Patient was activated as a code stroke, seen by Dr. Sallyann.  TNK considered but not given as patient was back to baseline.  CT head was negative for any obvious acute stroke or bleeding.  Dr. Sallyann felt symptoms were less likely to be due to stroke or TIA, could be explained by possible seizure.  She discussed obtaining MRI or observation with the family who would prefer to take the patient home.  She also discussed trying epileptic medication but family would prefer to hold off at this time.  He does have outpatient neurologist.  I also discussed with the patient and family to confirm this and confirmed given that he is 8 and has dementia, they do  not want to pursue further diagnostic imaging or observation in the emergency department although this was offered to them.  I think this is reasonable given his age and comorbidities.  I did recommend that should he have any further recurrent symptoms he should immediately be brought back to the emergency department.  Damien is in agreement with this.         Additional history obtained: -Additional history obtained from family and ems -External records from outside source obtained and reviewed including: Chart review including previous notes, labs, imaging, consultation notes including prior er visit for cellulitis    Lab Tests: -I ordered, reviewed, and interpreted labs.   The pertinent results include:   Labs Reviewed  CBC - Abnormal; Notable for the following components:      Result Value   RBC 3.24 (*)    Hemoglobin 10.8 (*)    HCT 32.6 (*)    MCV 100.6 (*)    Platelets 434 (*)    All other components within normal limits  COMPREHENSIVE METABOLIC PANEL WITH GFR - Abnormal; Notable for the following components:   BUN 32 (*)    Creatinine, Ser 1.88 (*)    Calcium  8.6 (*)    Total Protein 6.1 (*)    Albumin 3.1 (*)    GFR, Estimated 34 (*)    All other components within normal limits  I-STAT CHEM 8, ED - Abnormal; Notable for the following components:   BUN 32 (*)    Creatinine, Ser 2.00 (*)  Hemoglobin 10.9 (*)    HCT 32.0 (*)    All other components within normal limits  PROTIME-INR  APTT  DIFFERENTIAL  ETHANOL  CBG MONITORING, ED    Notable for ckd   EKG   EKG Interpretation Date/Time:  Wednesday June 26 2024 18:58:32 EDT Ventricular Rate:  67 PR Interval:  212 QRS Duration:  105 QT Interval:  459 QTC Calculation: 485 R Axis:   36  Text Interpretation: Sinus rhythm Borderline prolonged PR interval Borderline T abnormalities, diffuse leads Borderline prolonged QT interval Confirmed by Francesca Fallow ([id number]) on 06/26/2024 8:17:53 PM          Imaging Studies ordered: I ordered imaging studies including CT head  On my interpretation imaging demonstrates no acute process I independently visualized and interpreted imaging. I agree with the radiologist interpretation   Medicines ordered and prescription drug management: Meds ordered this encounter  Medications   sodium chloride  flush (NS) 0.9 % injection 3 mL    -I have reviewed the patients home medicines and have made adjustments as needed   Consultations Obtained: I requested consultation with the neurologist,  and discussed lab and imaging findings as well as pertinent plan - they recommend: discharge    Reevaluation: After the interventions noted above, I reevaluated the patient and found that their symptoms have resolved  Co morbidities that complicate the patient evaluation  Past Medical History:  Diagnosis Date   Anemia    Aortic atherosclerosis    Arthritis    Aspiration pneumonia (HCC) 2020   BPH (benign prostatic hypertrophy)    CAD (coronary artery disease) 05/23/2016   a.) CT 05/23/2016: extensive 3v CAD.   Cellulitis of face 12/21/2019   Chronic back pain    CKD (chronic kidney disease), stage III (HCC)    Colon cancer (HCC) 1988   Surgery alone, no history of chemo   Diastolic dysfunction 05/23/2014   a.) TTE 05/23/2014; EF 63%; G1DD. b.) TTE 10/27/2014: EF 61%, mild LA dilation; G2DD. c.) TTE 04/10/2020: EF >55%. GLS -20.3%. Triv panvalvular regurgitation; no stenosis.   Diverticulosis    Essential hypertension    First degree AV block    GERD (gastroesophageal reflux disease)    H/O calcium  pyrophosphate deposition disease (CPPD)    Hiatal hernia 01/06/2015   History of kidney stones    HLD (hyperlipidemia)    HOH (hard of hearing)    bilateral hearing aids   Hypokalemia    Impotence of organic origin    Incisional hernia 10/2021   Lewy body dementia (HCC)    Peritonsillar abscess determined by examination 05/15/2019   S/p abx,  I&D by ENT 05/2019   PSVT (paroxysmal supraventricular tachycardia)    a. 04/2014 Event Monitor: Freq bouts of SVT with rates to 180-->seen by EP with recommendation for RFCA, pt deferred;  b. 05/2014 Echo: EF nl, mild conc LVH, no rwma, Gr1 DD, mildMR/TR.   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   Rotator cuff rupture    Right; s/p repair   Unspecified hearing loss    Ventral hernia 1988      Dispostion: Disposition decision including need for hospitalization was considered, and patient discharged from emergency department.    Final Clinical Impression(s) / ED Diagnoses Final diagnoses:  Episode of transient neurologic symptoms     This chart was dictated using voice recognition software.  Despite best efforts to proofread,  errors can occur which can change the documentation meaning.    Francesca Fallow CROME, MD  06/26/24 2018  

## 2024-06-26 NOTE — ED Triage Notes (Signed)
 PT to ED from home with CO mowing the lawn and coming in and starting to have slurred speech.  Last known well per son was 1730.  PT was experiencing left facial numbness and left arm numbness.  PER EMS PT Blood glucose was 104. PT does have some leg swelling bilaterally.

## 2024-06-26 NOTE — Consult Note (Signed)
 NEUROLOGY CONSULT NOTE   Date of service: June 26, 2024 Patient Name: Michael [name] MRN:  [id number] DOB:  26-Oct-1931 Chief Complaint: Code stroke: RUE weakness, facial droop, slurred speech Requesting Provider: Francesca Elsie CROME, MD  History of Present Illness  Michael [name] is a 89 y.o. male with hx of Lewy Body Dementia, LUTS, CAD, HF, HTN, colon cancer (1988) who presents as a code stroke for RUE weakness, facial droop and slurred speech.  Patient's son reports he was mowing the lawn when he saw his dad come out to the porch with his walker and was screaming (as he normally does). However, what was different was that his father's face was droopy (unable to recall which side) and he was slurring his words. He came to his father who reported a funny sensation in his left arm and said it felt like cold water  was trickling down it. His left hand then seemed to have trouble reaching and holding onto his walker. He helped his father to sit down and noticed his left arm went floppy. His father was able to walk a few paces, did not notice weakness in legs.   By the time patient arrived to [facility] ED, face was no longer drooping and no slurred speech noted.  LKW: 1730 06/26/24 Pre-Modified rankin score: 2 IV Thrombolysis: No (too mild to treat, resolving, not stroke) EVT: No (too mild to treat) ICH Score: N/A  NIHSS components Score: Comment  1a Level of Conscious 0[x]  1[]  2[]  3[]      1b LOC Questions 0[]  1[x]  2[]       1c LOC Commands 0[x]  1[]  2[]       2 Best Gaze 0[x]  1[]  2[]       3 Visual 0[x]  1[]  2[]  3[]      4 Facial Palsy 0[x]  1[]  2[]  3[]      5a Motor Arm - left 0[x]  1[]  2[]  3[]  4[]  UN[]    5b Motor Arm - Right 0[x]  1[]  2[]  3[]  4[]  UN[]    6a Motor Leg - Left 0[x]  1[]  2[]  3[]  4[]  UN[]    6b Motor Leg - Right 0[x]  1[]  2[]  3[]  4[]  UN[]    7 Limb Ataxia 0[x]  1[]  2[]  UN[]      8 Sensory 0[x]  1[]  2[]  UN[]      9 Best Language 0[x]  1[]  2[]  3[]      10 Dysarthria 0[x]  1[]  2[]  UN[]      11  Extinct. and Inattention 0[x]  1[]  2[]       TOTAL: 1      ROS  Comprehensive ROS performed and pertinent positives documented in HPI    Past History   Past Medical History:  Diagnosis Date   Anemia    Aortic atherosclerosis    Arthritis    Aspiration pneumonia (HCC) 2020   BPH (benign prostatic hypertrophy)    CAD (coronary artery disease) 05/23/2016   a.) CT 05/23/2016: extensive 3v CAD.   Cellulitis of face 12/21/2019   Chronic back pain    CKD (chronic kidney disease), stage III (HCC)    Colon cancer (HCC) 1988   Surgery alone, no history of chemo   Diastolic dysfunction 05/23/2014   a.) TTE 05/23/2014; EF 63%; G1DD. b.) TTE 10/27/2014: EF 61%, mild LA dilation; G2DD. c.) TTE 04/10/2020: EF >55%. GLS -20.3%. Triv panvalvular regurgitation; no stenosis.   Diverticulosis    Essential hypertension    First degree AV block    GERD (gastroesophageal reflux disease)    H/O calcium  pyrophosphate deposition disease (CPPD)  Hiatal hernia 01/06/2015   History of kidney stones    HLD (hyperlipidemia)    HOH (hard of hearing)    bilateral hearing aids   Hypokalemia    Impotence of organic origin    Incisional hernia 10/2021   Lewy body dementia (HCC)    Peritonsillar abscess determined by examination 05/15/2019   S/p abx, I&D by ENT 05/2019   PSVT (paroxysmal supraventricular tachycardia)    a. 04/2014 Event Monitor: Freq bouts of SVT with rates to 180-->seen by EP with recommendation for RFCA, pt deferred;  b. 05/2014 Echo: EF nl, mild conc LVH, no rwma, Gr1 DD, mildMR/TR.   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   Rotator cuff rupture    Right; s/p repair   Unspecified hearing loss    Ventral hernia 1988    Past Surgical History:  Procedure Laterality Date   CATARACT EXTRACTION W/ INTRAOCULAR LENS IMPLANT Bilateral    COLONOSCOPY     HEMICOLOECTOMY W/ ANASTOMOSIS  1988   Michael [name]   INGUINAL HERNIA REPAIR Right 1963   INSERTION OF MESH N/A 11/01/2021   Procedure:  INSERTION OF MESH;  Surgeon: Michael Romano, MD;  Location: [facility] ORS;  Service: General;  Laterality: N/A;   MASTECTOMY, PARTIAL Left 02/07/2013   LEFT SUBCUTANEOUS MASTECTOMY; Michael [name]. Belinda, MD   ROTATOR CUFF REPAIR Right 2000    [facility]   TOTAL HIP ARTHROPLASTY Right 03/24/2020   Procedure: TOTAL HIP ARTHROPLASTY ANTERIOR APPROACH;  Surgeon: Michael Donnice, MD;  Location: [facility];  Service: Orthopedics;  Laterality: Right;  70 mins   TOTAL KNEE ARTHROPLASTY  03/05/2012   Procedure: TOTAL KNEE ARTHROPLASTY;  Surgeon: Michael LULLA Moan, MD;  Location: [facility];  Service: Orthopedics;  Laterality: Left;   UMBILICAL HERNIA REPAIR  1988   [facility], x 4   VEIN LIGATION AND STRIPPING Right    XI ROBOTIC ASSISTED VENTRAL HERNIA N/A 11/01/2021   Procedure: XI ROBOTIC ASSISTED VENTRAL HERNIA;  Surgeon: Michael Romano, MD;  Location: [facility] ORS;  Service: General;  Laterality: N/A;    Family History: Family History  Problem Relation Age of Onset   Osteoarthritis Father    Congestive Heart Failure Mother     Social History  reports that he has never smoked. He has never used smokeless tobacco. He reports current alcohol  use of about 1.0 standard drink of alcohol  per week. He reports that he does not use drugs.  Allergies  Allergen Reactions   Tamsulosin  Other (See Comments)    Caused Hypotension    Medications   Current Facility-Administered Medications:    sodium chloride  flush (NS) 0.9 % injection 3 mL, 3 mL, Intravenous, Once, Scheving, Elsie CROME, MD  Current Outpatient Medications:    amiodarone  (PACERONE ) 200 MG tablet, Take 0.5 tablets (100 mg total) by mouth daily., Disp: 45 tablet, Rfl: 3   camphor-menthol  (SARNA) lotion, Apply 1 application topically as needed for itching (dry skin)., Disp: , Rfl:    cefdinir (OMNICEF) 300 MG capsule, Take 1 capsule (300 mg total) by mouth 2 (two) times daily., Disp: 14 capsule, Rfl: 0   diltiazem  (DILACOR XR ) 120 MG 24 hr  capsule, Take 1 capsule (120 mg total) by mouth daily., Disp: 90 capsule, Rfl: 3   doxycycline  (VIBRAMYCIN ) 100 MG capsule, Take 1 capsule (100 mg total) by mouth 2 (two) times daily for 7 days., Disp: 14 capsule, Rfl: 0   eszopiclone (LUNESTA) 2 MG TABS tablet, Take 2 mg by mouth at bedtime. Take immediately  before bedtime, Disp: , Rfl:    levothyroxine  (SYNTHROID ) 50 MCG tablet, Take 50 mcg by mouth daily before breakfast., Disp: , Rfl:    losartan  (COZAAR ) 50 MG tablet, Take 1 tablet (50 mg total) by mouth daily., Disp: 90 tablet, Rfl: 3   Multiple Vitamins-Minerals (CENTRUM SILVER ADULT 50+ PO), Take 1 tablet by mouth daily., Disp: , Rfl:    omeprazole  (PRILOSEC) 20 MG capsule, TAKE 1 CAPSULE BY MOUTH EVERY DAY AS NEEDED., Disp: 90 capsule, Rfl: 3   oxyCODONE -acetaminophen  (PERCOCET) 10-325 MG tablet, Take 1 tablet by mouth 3 (three) times daily as needed for pain., Disp: , Rfl:    potassium chloride  (KLOR-CON ) 10 MEQ tablet, Take 20 mEq by mouth 2 (two) times daily., Disp: , Rfl:    silodosin  (RAPAFLO ) 8 MG CAPS capsule, Take 1 capsule (8 mg total) by mouth daily with breakfast., Disp: 30 capsule, Rfl: 11   Vibegron  (GEMTESA ) 75 MG TABS, Take 1 tablet (75 mg total) by mouth daily., Disp: 30 tablet, Rfl: 11  Vitals   Vitals:   06/26/24 1846 06/26/24 1847 06/26/24 1848 06/26/24 1856  BP:  (!) 152/80    Pulse:      Resp:      Temp: 98.5 F (36.9 C)     TempSrc: Oral     SpO2:   98% 100%  Weight:        Body mass index is 25.24 kg/m.   Physical Exam   Constitutional: Appears well-developed and well-nourished.  Psych: Crying initially and then became very jovial Eyes: No scleral injection.  HENT: No OP obstruction.  Head: Normocephalic.  Cardiovascular: Normal rate and regular rhythm.  Respiratory: Effort normal, non-labored breathing.  GI: Soft.  No distension. There is no tenderness.  Skin: WDI.   Neurologic Examination   Mental status: alert, oriented to person, place  and time. Able to provide history. Speech: no dysarthria, mild WFD Cranial nerves: PERRL EOMI VF full Face sensation intact bilaterally. Face symmetric at rest and with activation. Hearing not intact even with hearing aids. Palate elevation symmetric Tongue protrudes midline and has full range of motion. SCM's full strength bilaterally. Motor: Normal bulk and tone. No abnormal movements RUE: shoulder abduction 5/5, biceps 5/5, triceps 5/5, wrist flexion 5/5, wrist extension 5/5, hand grip 5/5 LUE: shoulder abduction 5/5, biceps 5/5, triceps 5/5, wrist flexion 5/5, wrist extension 5/5, hand grip 5/5 RLE: hip flexion 5/5, knee flexion 5/5, knee extension 5/5, ankle dorsiflexion 5/5, plantar flexion 5/5 LLE: hip flexion 5/5, knee flexion 5/5, knee extension 5/5, ankle dorsiflexion 5/5, plantar flexion 5/5 Sensory: Grossly intact to light touch throughout. Reflexes: DTR's deferred. Downgoing toes bilaterally. Coordination: FTN intact.  Gait: deferred   Labs/Imaging/Neurodiagnostic studies   CBC:  Recent Labs  Lab 07/03/2024 1257 06/26/24 1827 06/26/24 1832  WBC 9.4 4.9  --   NEUTROABS 7.4 2.7  --   HGB 10.9* 10.8* 10.9*  HCT 33.0* 32.6* 32.0*  MCV 102.5* 100.6*  --   PLT 374 434*  --    Basic Metabolic Panel:  Lab Results  Component Value Date   NA 138 06/26/2024   K 4.3 06/26/2024   CO2 24 06/26/2024   GLUCOSE 93 06/26/2024   BUN 32 (H) 06/26/2024   CREATININE 2.00 (H) 06/26/2024   CALCIUM  8.6 (L) 06/26/2024   GFRNONAA 34 (L) 06/26/2024   GFRAA 49 (L) 03/26/2020   Lipid Panel:  Lab Results  Component Value Date   LDLCALC 54 05/07/2020   HgbA1c:  Lab Results  Component Value Date   HGBA1C 4.9 02/19/2020   Urine Drug Screen: No results found for: LABOPIA, COCAINSCRNUR, LABBENZ, AMPHETMU, THCU, LABBARB  Alcohol  Level     Component Value Date/Time   [facility] <15 06/26/2024 1827   INR  Lab Results  Component Value Date   INR 1.1 06/26/2024    APTT  Lab Results  Component Value Date   APTT 27 06/26/2024   AED levels: No results found for: PHENYTOIN, ZONISAMIDE, LAMOTRIGINE, LEVETIRACETA  CT Head without contrast(Personally reviewed): No acute abnormality.   ASSESSMENT   Michael [name] is a 89 y.o. male with history of Lewy Body Dementia, HTN, HLD, CAD who presents as a code stroke for transient facial droop, slurred speech and left arm abnormal sensation and weakness. As symptoms resolving, TNK and thrombectomy were not pursued. Discussed these decisions with son, POA, over phone and at bedside. Further interview revealed that patient had a positive symptom (sensation of something cold going down his arm) which is more consistent with seizure than stroke. He certainly is at increased risk due to his dementia. Decision made with patient, son and wife at bedside to not pursue MRI or start seizure medication at this time.  RECOMMENDATIONS  - No further work-up  ______________________________________________________________________    Signed, Normie CHRISTELLA Blower, MD [facility]

## 2024-06-26 NOTE — Discharge Instructions (Signed)
 We evaluated Michael [name]'s episode of left arm weakness and slurred speech.  His symptoms resolved by the time he arrived in the emergency department.  We did obtain a CT scan and laboratory testing which was reassuring.  His symptoms could be due to a TIA (mini stroke) or seizure.  He was seen by neurology.  We discussed obtaining further testing such as an MRI or further observation but after discussion you elected to take him home.  Given his age and dementia we think that this is reasonable.  We would recommend that he follows up with his outpatient neurologist.  If he does have any recurrent symptoms, please bring him back to the emergency department immediately.

## 2024-06-26 NOTE — Code Documentation (Signed)
 Stroke Response Nurse Documentation Code Documentation  Michael [name] is a 89 y.o. male arriving to [facility]  via [facility] on 10/15 with past medical hx of Lewy body dementia, HLD, CKD, HTN. On No antithrombotic. Code stroke was activated by EMS.   Patient from home where he was LKW at 1730 at which time his son noticed slurred speech and LUE weakness after coming in from mowing the yard.    Stroke team at the bedside on patient arrival. Labs drawn and patient cleared for CT by Dr. Francesca. Patient to CT with team. NIHSS 1, see documentation for details and code stroke times. Patient with disoriented on exam, which is the patient's baseline due to dementia. The following imaging was completed:  CT Head. Patient is not a candidate for IV Thrombolytic due to return to baseline. Patient is not a candidate for IR due to LVO not suspected.   Care Plan: q2 NIHSS and vitals, swallow screen before PO intake.   Bedside handoff with ED RN Lilly.    Lauraine LITTIE Searle  Stroke Response RN

## 2024-06-27 ENCOUNTER — Other Ambulatory Visit: Payer: Self-pay

## 2024-06-27 MED ORDER — ALFUZOSIN HCL ER 10 MG PO TB24: 10.0000 mg | ORAL_TABLET | Freq: Every day | ORAL | 1 refills | Status: AC

## 2024-06-27 NOTE — Telephone Encounter (Signed)
 I have updated his chart. Per [facility] pt has to try and failed both tamsulosin  and Alfuzosin.    Discontinued the silodosin  and erxed the alfuzosin.  Will send to [facility] for awareness.

## 2024-07-19 NOTE — Telephone Encounter (Signed)
 Pts wife confirmed he is taking Alfizosin with any issues. She will contact the office if anything changes.

## 2024-07-30 ENCOUNTER — Other Ambulatory Visit: Payer: Self-pay | Admitting: Cardiovascular Disease

## 2024-08-27 ENCOUNTER — Other Ambulatory Visit: Payer: Self-pay | Admitting: Cardiovascular Disease

## 2024-09-06 ENCOUNTER — Telehealth: Payer: Self-pay | Admitting: Cardiovascular Disease

## 2024-09-06 ENCOUNTER — Other Ambulatory Visit (HOSPITAL_COMMUNITY): Payer: Self-pay

## 2024-09-06 ENCOUNTER — Telehealth: Payer: Self-pay

## 2024-09-06 NOTE — Telephone Encounter (Signed)
 Called and spoke with Toy, patient's wife, per DPR.  Informed her of the test claim run by Pharmacy and the result being copay of $0 for a 30 day supply of Amiodarone .  And inquired whether she had spoken with her insurance company.  Toy states that she had, and everything is straightened out.  It appears that [facility] was using [facility], according to [place], but now it is fine.  Toy expressed gratitude for the call.

## 2024-09-06 NOTE — Telephone Encounter (Signed)
 Me to Tobie Mac LABOR, RN    09/06/24  3:47 PM Note Pharmacy Patient Advocate Encounter   Insurance verification completed.   The patient is insured through [employer] claim for AMIODARONE . Currently a quantity of 30 is a 30 day supply and the co-pay is $0 . The current 30 day co-pay is, $0.  No PA needed at this time.   This test claim was processed through [facility]- copay amounts may vary at other pharmacies due to pharmacy/plan contracts, or as the patient moves through the different stages of their insurance plan.       We are only able to test claim based on current plan information. As of now the plan is covering the medication at no cost to the patient. For future test claim information, patient will have to refer to the insurance plan directly as we are unable to bill or investigate future test claims.        Tobie Mac LABOR, RN to Gollan, Timothy J, MD  Cristopher Olivia PARAS, RN  Rx Med Assistance Team  Rx Prior Auth Team (Selected Message)     09/06/24  3:35 PM Patient states Amiodarone  no longer being covered by the patient's insurance.  Any help would be greatly appreciated.

## 2024-09-06 NOTE — Telephone Encounter (Signed)
 Pharmacy Patient Advocate Encounter  Insurance verification completed.   The patient is insured through [employer] claim for AMIODARONE . Currently a quantity of 30 is a 30 day supply and the co-pay is $0 . The current 30 day co-pay is, $0.  No PA needed at this time.  This test claim was processed through [facility]- copay amounts may vary at other pharmacies due to pharmacy/plan contracts, or as the patient moves through the different stages of their insurance plan.     We are only able to test claim based on current plan information. As of now the plan is covering the medication at no cost to the patient. For future test claim information, patient will have to refer to the insurance plan directly as we are unable to bill or investigate future test claims.

## 2024-09-06 NOTE — Telephone Encounter (Signed)
 Called and spoke with Toy, patient's wife, per DPR.  She states that she checked online on [facility] website and it said that her insurance company would not cover her husband's Amiodarone .  Informed her to call and speak with the insurance company to see if that is indeed correct or not and ask for reasons of why it would not be covered any longer, as well as some alternatives that they would cover.  Informed Toy that, in the meantime, I will send a message to Dr. Gollan and our Prior Auth and Pharmacy team for any suggestions they may have.  Patient verbalized understanding and expressed gratitude for the call.

## 2024-09-06 NOTE — Telephone Encounter (Signed)
 Pt c/o medication issue:  1. Name of Medication: amiodarone  (PACERONE ) 200 MG tablet   2. How are you currently taking this medication (dosage and times per day)? As written  3. Are you having a reaction (difficulty breathing--STAT)? No  4. What is your medication issue? PT spouse is stating insurance is stating this med will no longer be  qualified under insurance

## 2024-09-25 ENCOUNTER — Other Ambulatory Visit: Payer: Self-pay | Admitting: Cardiovascular Disease

## 2024-09-26 NOTE — Telephone Encounter (Signed)
 TSH done on 07/02/24 Magnesium  last done on 11/12/22  In accordance with refill protocols, please review and address the following requirements before this medication refill can be authorized:  Chest X-ray, Labs , and Other testing

## 2024-09-27 ENCOUNTER — Telehealth: Payer: Self-pay | Admitting: Cardiovascular Disease

## 2024-09-27 NOTE — Telephone Encounter (Signed)
" °*  STAT* If patient is at the pharmacy, call can be transferred to refill team.   1. Which medications need to be refilled? (please list name of each medication and dose if known)   amiodarone  (PACERONE ) 200 MG tablet    2. Would you like to learn more about the convenience, safety, & potential cost savings by using the [facility]? no   3. Are you open to using the [facility] (Type [facility]. no   4. Which pharmacy/location (including street and city if local pharmacy) is medication to be sent to?  [id number] [place] [place] [facility] [address]     5. Do they need a 30 day or 90 day supply? 90 day  Pt is completely out.  Scheduled for 2/10 "

## 2024-10-22 ENCOUNTER — Ambulatory Visit: Admitting: Cardiovascular Disease
